# Patient Record
Sex: Female | Born: 1960 | Race: White | Hispanic: No | Marital: Married | Smoking: Former smoker
Health system: Southern US, Community
[De-identification: ages and names within clinical notes are randomized; demographics above are authoritative.]

## PROBLEM LIST (undated history)

## (undated) DIAGNOSIS — I10 Essential (primary) hypertension: Secondary | ICD-10-CM

## (undated) DIAGNOSIS — I4891 Unspecified atrial fibrillation: Secondary | ICD-10-CM

## (undated) DIAGNOSIS — C50919 Malignant neoplasm of unspecified site of unspecified female breast: Secondary | ICD-10-CM

## (undated) DIAGNOSIS — I2510 Atherosclerotic heart disease of native coronary artery without angina pectoris: Secondary | ICD-10-CM

## (undated) DIAGNOSIS — K219 Gastro-esophageal reflux disease without esophagitis: Secondary | ICD-10-CM

## (undated) DIAGNOSIS — E119 Type 2 diabetes mellitus without complications: Secondary | ICD-10-CM

## (undated) HISTORY — PX: FINGER SURGERY: SHX640

## (undated) HISTORY — DX: Gastro-esophageal reflux disease without esophagitis: K21.9

## (undated) HISTORY — PX: MASTECTOMY: SHX3

## (undated) HISTORY — DX: Essential (primary) hypertension: I10

## (undated) HISTORY — DX: Type 2 diabetes mellitus without complications: E11.9

## (undated) HISTORY — DX: Atherosclerotic heart disease of native coronary artery without angina pectoris: I25.10

## (undated) HISTORY — DX: Malignant neoplasm of unspecified site of unspecified female breast: C50.919

---

## 1999-03-21 ENCOUNTER — Ambulatory Visit (HOSPITAL_COMMUNITY): Admission: RE | Admit: 1999-03-21 | Discharge: 1999-03-21 | Payer: Self-pay | Admitting: Obstetrics and Gynecology

## 1999-03-21 ENCOUNTER — Encounter: Payer: Self-pay | Admitting: Obstetrics and Gynecology

## 1999-07-21 ENCOUNTER — Encounter: Admission: RE | Admit: 1999-07-21 | Discharge: 1999-07-21 | Payer: Self-pay | Admitting: Internal Medicine

## 1999-12-22 DIAGNOSIS — K219 Gastro-esophageal reflux disease without esophagitis: Secondary | ICD-10-CM | POA: Insufficient documentation

## 1999-12-22 HISTORY — DX: Gastro-esophageal reflux disease without esophagitis: K21.9

## 2000-01-06 ENCOUNTER — Encounter: Payer: Self-pay | Admitting: Obstetrics and Gynecology

## 2000-01-06 ENCOUNTER — Encounter: Admission: RE | Admit: 2000-01-06 | Discharge: 2000-01-06 | Payer: Self-pay | Admitting: Obstetrics and Gynecology

## 2000-01-26 ENCOUNTER — Encounter: Payer: Self-pay | Admitting: *Deleted

## 2000-01-26 ENCOUNTER — Encounter (INDEPENDENT_AMBULATORY_CARE_PROVIDER_SITE_OTHER): Payer: Self-pay | Admitting: Specialist

## 2000-01-26 ENCOUNTER — Ambulatory Visit (HOSPITAL_COMMUNITY): Admission: RE | Admit: 2000-01-26 | Discharge: 2000-01-26 | Payer: Self-pay | Admitting: *Deleted

## 2000-01-30 ENCOUNTER — Encounter: Admission: RE | Admit: 2000-01-30 | Discharge: 2000-04-29 | Payer: Self-pay | Admitting: Radiation Oncology

## 2000-02-12 ENCOUNTER — Ambulatory Visit (HOSPITAL_COMMUNITY): Admission: RE | Admit: 2000-02-12 | Discharge: 2000-02-13 | Payer: Self-pay

## 2000-02-12 ENCOUNTER — Encounter (INDEPENDENT_AMBULATORY_CARE_PROVIDER_SITE_OTHER): Payer: Self-pay | Admitting: Specialist

## 2000-03-01 ENCOUNTER — Encounter: Payer: Self-pay | Admitting: Oncology

## 2000-03-01 ENCOUNTER — Ambulatory Visit (HOSPITAL_COMMUNITY): Admission: RE | Admit: 2000-03-01 | Discharge: 2000-03-01 | Payer: Self-pay | Admitting: Oncology

## 2000-03-08 ENCOUNTER — Other Ambulatory Visit: Admission: RE | Admit: 2000-03-08 | Discharge: 2000-03-08 | Payer: Self-pay | Admitting: Obstetrics and Gynecology

## 2000-03-09 ENCOUNTER — Ambulatory Visit (HOSPITAL_COMMUNITY): Admission: RE | Admit: 2000-03-09 | Discharge: 2000-03-09 | Payer: Self-pay

## 2000-03-12 ENCOUNTER — Encounter: Admission: RE | Admit: 2000-03-12 | Discharge: 2000-03-12 | Payer: Self-pay | Admitting: Hematology and Oncology

## 2000-06-18 ENCOUNTER — Other Ambulatory Visit: Admission: RE | Admit: 2000-06-18 | Discharge: 2000-06-18 | Payer: Self-pay

## 2000-06-18 ENCOUNTER — Encounter (INDEPENDENT_AMBULATORY_CARE_PROVIDER_SITE_OTHER): Payer: Self-pay | Admitting: *Deleted

## 2000-06-18 ENCOUNTER — Encounter: Admission: RE | Admit: 2000-06-18 | Discharge: 2000-06-18 | Payer: Self-pay

## 2000-07-07 ENCOUNTER — Inpatient Hospital Stay (HOSPITAL_COMMUNITY): Admission: RE | Admit: 2000-07-07 | Discharge: 2000-07-08 | Payer: Self-pay

## 2000-08-09 ENCOUNTER — Encounter: Admission: RE | Admit: 2000-08-09 | Discharge: 2000-08-09 | Payer: Self-pay | Admitting: Radiation Oncology

## 2000-09-29 ENCOUNTER — Encounter: Admission: RE | Admit: 2000-09-29 | Discharge: 2000-12-28 | Payer: Self-pay | Admitting: Radiation Oncology

## 2000-10-21 ENCOUNTER — Ambulatory Visit: Admission: RE | Admit: 2000-10-21 | Discharge: 2000-10-21 | Payer: Self-pay | Admitting: *Deleted

## 2001-02-07 ENCOUNTER — Ambulatory Visit (HOSPITAL_BASED_OUTPATIENT_CLINIC_OR_DEPARTMENT_OTHER): Admission: RE | Admit: 2001-02-07 | Discharge: 2001-02-07 | Payer: Self-pay

## 2001-03-14 ENCOUNTER — Other Ambulatory Visit: Admission: RE | Admit: 2001-03-14 | Discharge: 2001-03-14 | Payer: Self-pay | Admitting: Obstetrics and Gynecology

## 2001-06-13 ENCOUNTER — Encounter: Admission: RE | Admit: 2001-06-13 | Discharge: 2001-06-13 | Payer: Self-pay

## 2001-08-10 ENCOUNTER — Encounter: Admission: RE | Admit: 2001-08-10 | Discharge: 2001-08-10 | Payer: Self-pay | Admitting: Oncology

## 2001-08-10 ENCOUNTER — Encounter: Payer: Self-pay | Admitting: Oncology

## 2001-09-13 ENCOUNTER — Emergency Department (HOSPITAL_COMMUNITY): Admission: AC | Admit: 2001-09-13 | Discharge: 2001-09-13 | Payer: Self-pay

## 2002-03-17 ENCOUNTER — Other Ambulatory Visit: Admission: RE | Admit: 2002-03-17 | Discharge: 2002-03-17 | Payer: Self-pay | Admitting: Obstetrics and Gynecology

## 2002-04-10 ENCOUNTER — Ambulatory Visit (HOSPITAL_COMMUNITY): Admission: RE | Admit: 2002-04-10 | Discharge: 2002-04-10 | Payer: Self-pay | Admitting: Orthopedic Surgery

## 2002-04-10 ENCOUNTER — Encounter: Payer: Self-pay | Admitting: Orthopedic Surgery

## 2002-08-14 ENCOUNTER — Encounter: Admission: RE | Admit: 2002-08-14 | Discharge: 2002-08-14 | Payer: Self-pay | Admitting: Oncology

## 2002-08-14 ENCOUNTER — Encounter: Payer: Self-pay | Admitting: Oncology

## 2003-08-30 ENCOUNTER — Encounter: Payer: Self-pay | Admitting: Oncology

## 2003-08-30 ENCOUNTER — Encounter: Admission: RE | Admit: 2003-08-30 | Discharge: 2003-08-30 | Payer: Self-pay | Admitting: Oncology

## 2004-01-30 ENCOUNTER — Encounter: Admission: RE | Admit: 2004-01-30 | Discharge: 2004-01-30 | Payer: Self-pay | Admitting: Internal Medicine

## 2004-08-18 ENCOUNTER — Encounter: Admission: RE | Admit: 2004-08-18 | Discharge: 2004-08-18 | Payer: Self-pay | Admitting: Obstetrics and Gynecology

## 2005-03-11 ENCOUNTER — Ambulatory Visit: Payer: Self-pay | Admitting: Oncology

## 2005-04-17 ENCOUNTER — Encounter: Admission: RE | Admit: 2005-04-17 | Discharge: 2005-04-17 | Payer: Self-pay | Admitting: Oncology

## 2005-04-18 ENCOUNTER — Encounter: Admission: RE | Admit: 2005-04-18 | Discharge: 2005-04-18 | Payer: Self-pay | Admitting: Oncology

## 2005-07-05 ENCOUNTER — Encounter: Payer: Self-pay | Admitting: *Deleted

## 2005-07-05 ENCOUNTER — Observation Stay (HOSPITAL_COMMUNITY): Admission: RE | Admit: 2005-07-05 | Discharge: 2005-07-08 | Payer: Self-pay | Admitting: Internal Medicine

## 2005-07-06 ENCOUNTER — Encounter (INDEPENDENT_AMBULATORY_CARE_PROVIDER_SITE_OTHER): Payer: Self-pay | Admitting: Cardiology

## 2005-07-30 ENCOUNTER — Encounter: Admission: RE | Admit: 2005-07-30 | Discharge: 2005-10-28 | Payer: Self-pay | Admitting: Internal Medicine

## 2006-02-05 ENCOUNTER — Encounter: Admission: RE | Admit: 2006-02-05 | Discharge: 2006-02-05 | Payer: Self-pay | Admitting: Internal Medicine

## 2006-03-09 ENCOUNTER — Ambulatory Visit: Payer: Self-pay | Admitting: Oncology

## 2006-03-24 ENCOUNTER — Encounter: Admission: RE | Admit: 2006-03-24 | Discharge: 2006-03-24 | Payer: Self-pay | Admitting: Oncology

## 2006-04-21 ENCOUNTER — Encounter: Admission: RE | Admit: 2006-04-21 | Discharge: 2006-04-21 | Payer: Self-pay | Admitting: Oncology

## 2007-03-03 ENCOUNTER — Ambulatory Visit: Payer: Self-pay | Admitting: Oncology

## 2007-03-08 LAB — COMPREHENSIVE METABOLIC PANEL
ALT: 15 U/L (ref 0–35)
AST: 16 U/L (ref 0–37)
Albumin: 3.8 g/dL (ref 3.5–5.2)
Alkaline Phosphatase: 51 U/L (ref 39–117)
BUN: 16 mg/dL (ref 6–23)
CO2: 30 mEq/L (ref 19–32)
Calcium: 9.2 mg/dL (ref 8.4–10.5)
Chloride: 97 mEq/L (ref 96–112)
Creatinine, Ser: 0.87 mg/dL (ref 0.40–1.20)
Glucose, Bld: 91 mg/dL (ref 70–99)
Potassium: 3.8 mEq/L (ref 3.5–5.3)
Sodium: 135 mEq/L (ref 135–145)
Total Bilirubin: 0.6 mg/dL (ref 0.3–1.2)
Total Protein: 7.2 g/dL (ref 6.0–8.3)

## 2007-03-08 LAB — CBC WITH DIFFERENTIAL/PLATELET
BASO%: 0.3 % (ref 0.0–2.0)
Basophils Absolute: 0 10*3/uL (ref 0.0–0.1)
EOS%: 1.2 % (ref 0.0–7.0)
Eosinophils Absolute: 0.2 10*3/uL (ref 0.0–0.5)
HCT: 38.1 % (ref 34.8–46.6)
HGB: 13.2 g/dL (ref 11.6–15.9)
LYMPH%: 16.9 % (ref 14.0–48.0)
MCH: 29.4 pg (ref 26.0–34.0)
MCHC: 34.8 g/dL (ref 32.0–36.0)
MCV: 84.5 fL (ref 81.0–101.0)
MONO#: 1 10*3/uL — ABNORMAL HIGH (ref 0.1–0.9)
MONO%: 6.9 % (ref 0.0–13.0)
NEUT#: 11 10*3/uL — ABNORMAL HIGH (ref 1.5–6.5)
NEUT%: 74.7 % (ref 39.6–76.8)
Platelets: 341 10*3/uL (ref 145–400)
RBC: 4.51 10*6/uL (ref 3.70–5.32)
RDW: 14.1 % (ref 11.3–14.5)
WBC: 14.7 10*3/uL — ABNORMAL HIGH (ref 3.9–10.0)
lymph#: 2.5 10*3/uL (ref 0.9–3.3)

## 2007-03-08 LAB — LACTATE DEHYDROGENASE: LDH: 142 U/L (ref 94–250)

## 2007-05-25 ENCOUNTER — Encounter: Admission: RE | Admit: 2007-05-25 | Discharge: 2007-05-25 | Payer: Self-pay | Admitting: Oncology

## 2007-06-07 ENCOUNTER — Encounter: Admission: RE | Admit: 2007-06-07 | Discharge: 2007-06-07 | Payer: Self-pay | Admitting: Oncology

## 2008-03-09 ENCOUNTER — Ambulatory Visit: Payer: Self-pay | Admitting: Oncology

## 2008-03-10 IMAGING — MG MM DIAGNOSTIC LTD LEFT
3 series · 3 of 3 positions shown · non-contrast
Comparison: none

[REDACTED] LEFT
CC and MLO view(s) were taken of the left breast.

DIGITAL LIMITED LEFT DIAGNOSTIC MAMMOGRAM:
CLINICAL DATA: Abnormal screening mammogram; history of right breast cancer with mastectomy in 
4779.
Comparison studies are dated 08-18-04 and 03-24-06.  Additional views reveal no persistent mass or 
distortion within the subareolar left breast.    The fibroglandular parenchymal pattern is stable.

[L CC (1 of 2)]
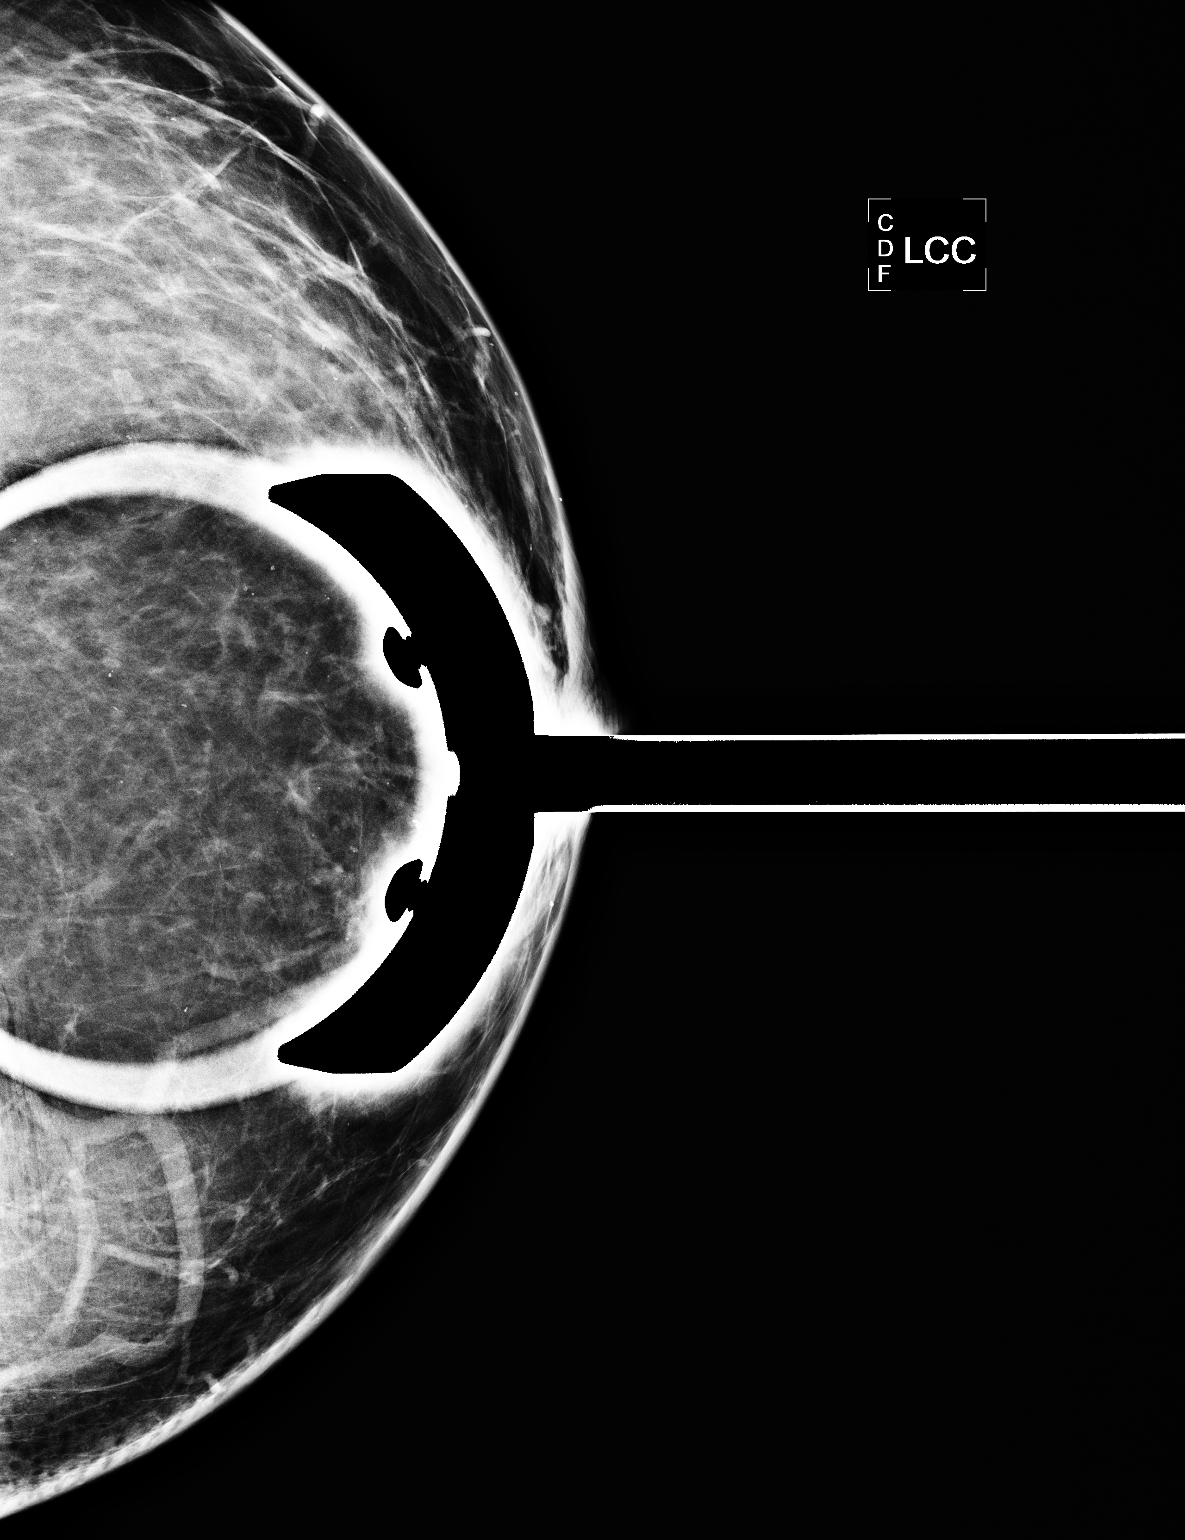

[L ML]
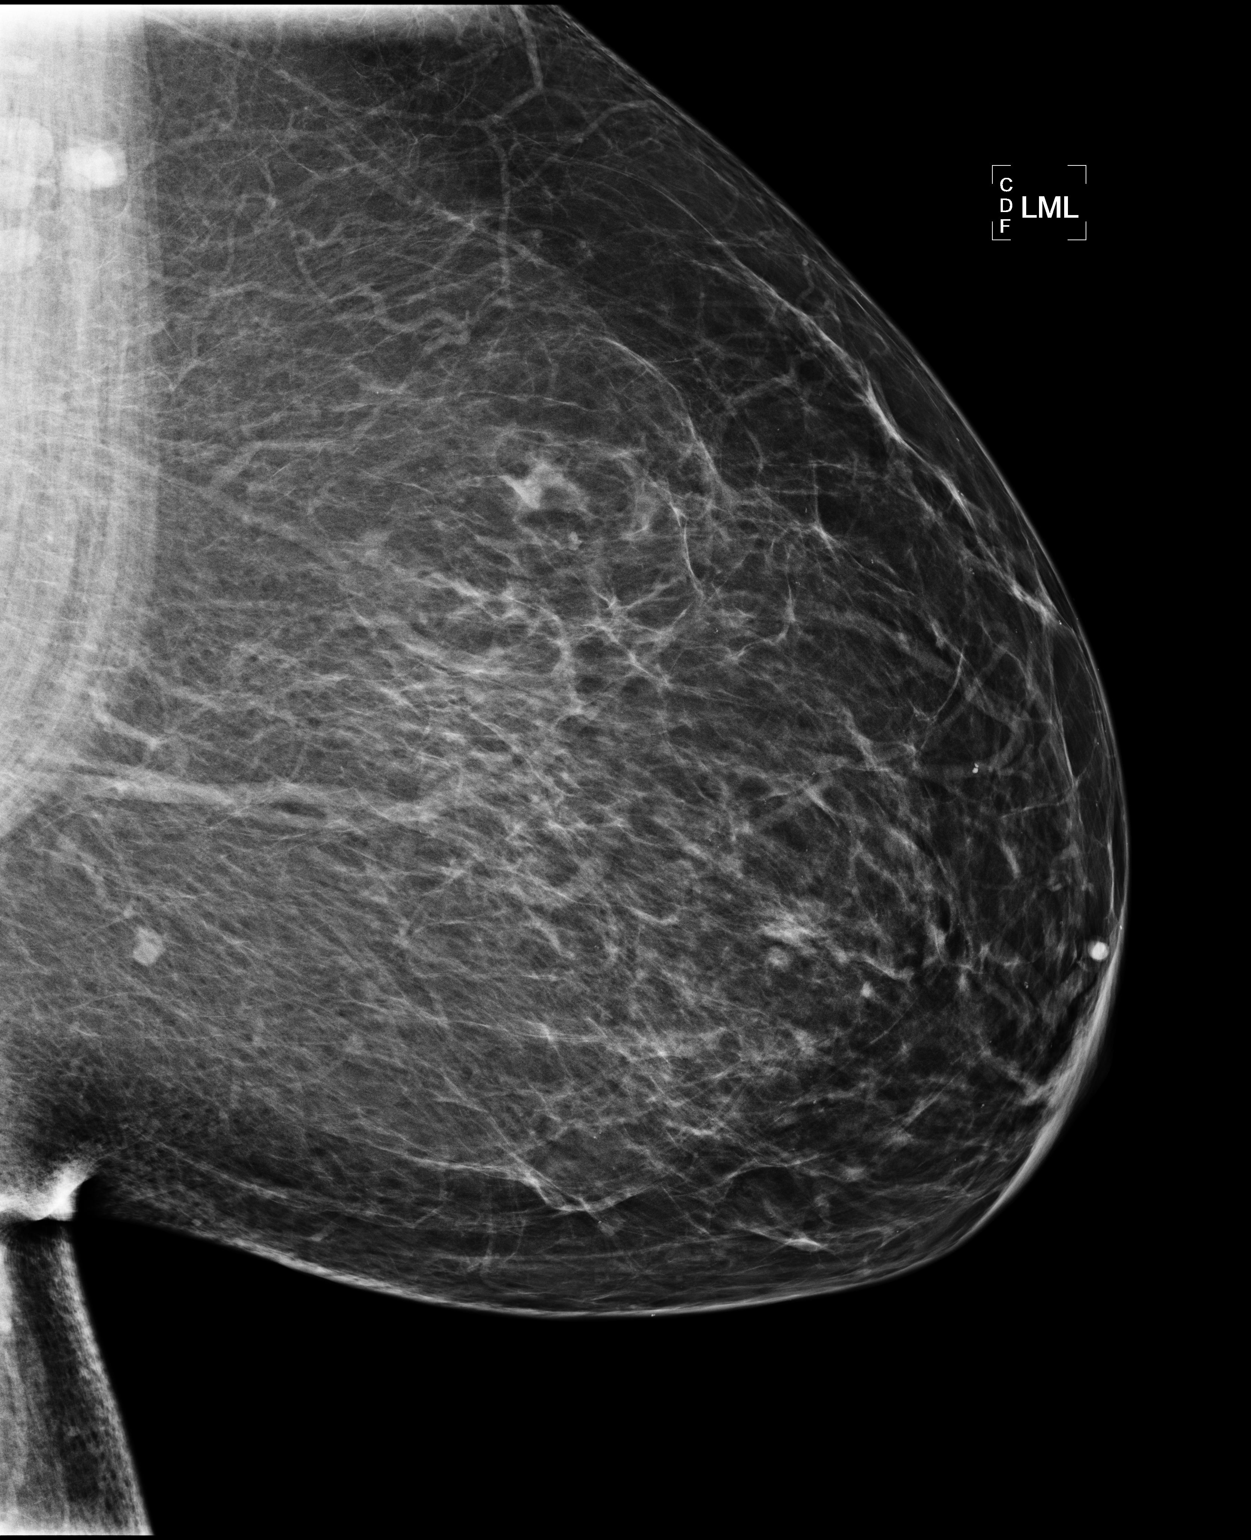

[L CC (2 of 2)]
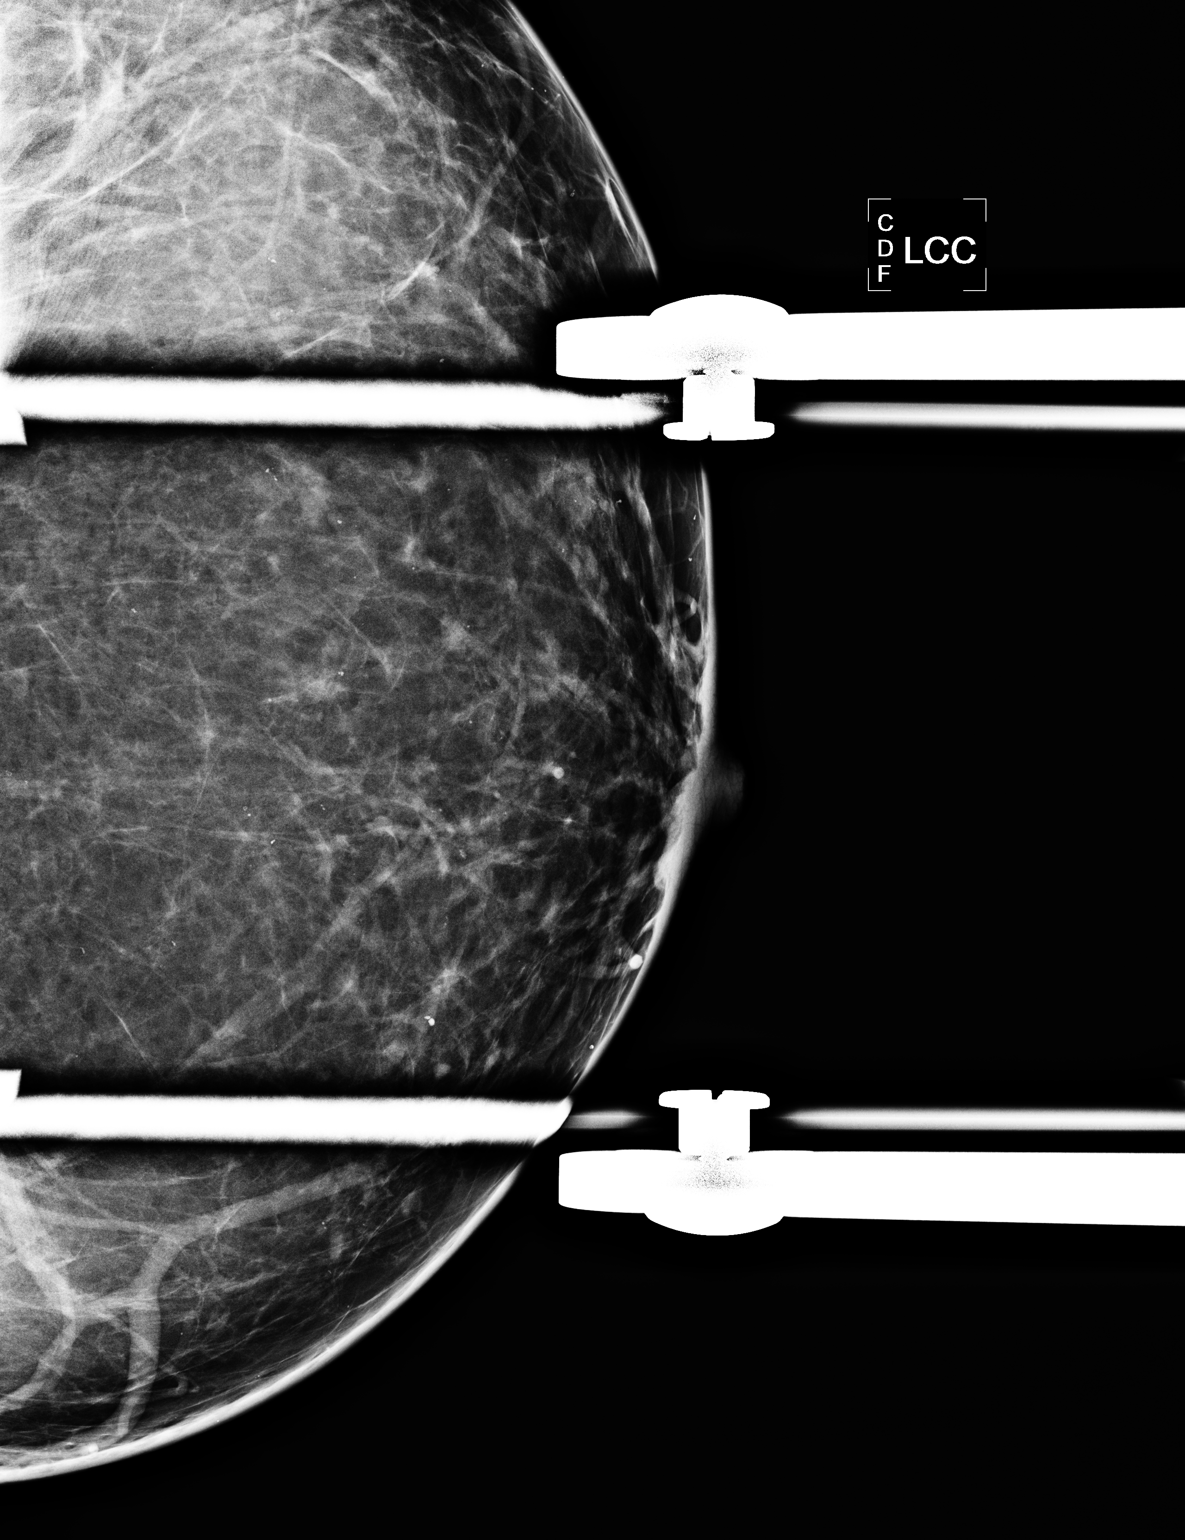

[3 of 3 positions shown; findings below may reference images not displayed]

IMPRESSION: There is no specific radiographic evidence of malignancy on the left.  Screening mammogram in one 
year is recommended.

ASSESSMENT: Negative - BI-RADS 1

Screening mammogram of both breasts in 1 year.
, THIS PROCEDURE WAS A DIGITAL MAMMOGRAM.

## 2008-04-19 LAB — CBC WITH DIFFERENTIAL/PLATELET
BASO%: 0.2 % (ref 0.0–2.0)
Basophils Absolute: 0 10*3/uL (ref 0.0–0.1)
EOS%: 1.4 % (ref 0.0–7.0)
Eosinophils Absolute: 0.2 10*3/uL (ref 0.0–0.5)
HCT: 37.9 % (ref 34.8–46.6)
HGB: 13 g/dL (ref 11.6–15.9)
LYMPH%: 19.5 % (ref 14.0–48.0)
MCH: 29.2 pg (ref 26.0–34.0)
MCHC: 34.2 g/dL (ref 32.0–36.0)
MCV: 85.4 fL (ref 81.0–101.0)
MONO#: 0.6 10*3/uL (ref 0.1–0.9)
MONO%: 4.1 % (ref 0.0–13.0)
NEUT#: 10.8 10*3/uL — ABNORMAL HIGH (ref 1.5–6.5)
NEUT%: 74.8 % (ref 39.6–76.8)
Platelets: 350 10*3/uL (ref 145–400)
RBC: 4.44 10*6/uL (ref 3.70–5.32)
RDW: 14.1 % (ref 11.3–14.5)
WBC: 14.4 10*3/uL — ABNORMAL HIGH (ref 3.9–10.0)
lymph#: 2.8 10*3/uL (ref 0.9–3.3)

## 2008-04-19 LAB — COMPREHENSIVE METABOLIC PANEL
ALT: 11 U/L (ref 0–35)
AST: 14 U/L (ref 0–37)
Albumin: 3.9 g/dL (ref 3.5–5.2)
Alkaline Phosphatase: 52 U/L (ref 39–117)
BUN: 11 mg/dL (ref 6–23)
CO2: 26 mEq/L (ref 19–32)
Calcium: 8.4 mg/dL (ref 8.4–10.5)
Chloride: 99 mEq/L (ref 96–112)
Creatinine, Ser: 0.74 mg/dL (ref 0.40–1.20)
Glucose, Bld: 137 mg/dL — ABNORMAL HIGH (ref 70–99)
Potassium: 3.3 mEq/L — ABNORMAL LOW (ref 3.5–5.3)
Sodium: 139 mEq/L (ref 135–145)
Total Bilirubin: 0.4 mg/dL (ref 0.3–1.2)
Total Protein: 7.3 g/dL (ref 6.0–8.3)

## 2008-04-19 LAB — LACTATE DEHYDROGENASE: LDH: 176 U/L (ref 94–250)

## 2008-04-24 ENCOUNTER — Ambulatory Visit: Payer: Self-pay | Admitting: Oncology

## 2008-06-07 ENCOUNTER — Encounter: Admission: RE | Admit: 2008-06-07 | Discharge: 2008-06-07 | Payer: Self-pay | Admitting: Oncology

## 2008-10-23 ENCOUNTER — Ambulatory Visit: Payer: Self-pay | Admitting: Internal Medicine

## 2008-10-26 ENCOUNTER — Ambulatory Visit: Payer: Self-pay | Admitting: Internal Medicine

## 2008-10-31 ENCOUNTER — Encounter: Admission: RE | Admit: 2008-10-31 | Discharge: 2008-10-31 | Payer: Self-pay | Admitting: Internal Medicine

## 2008-11-08 ENCOUNTER — Ambulatory Visit: Payer: Self-pay | Admitting: Internal Medicine

## 2009-06-03 ENCOUNTER — Ambulatory Visit: Payer: Self-pay | Admitting: Oncology

## 2009-06-04 LAB — CBC WITH DIFFERENTIAL/PLATELET
BASO%: 0.5 % (ref 0.0–2.0)
Basophils Absolute: 0.1 10*3/uL (ref 0.0–0.1)
EOS%: 2.3 % (ref 0.0–7.0)
Eosinophils Absolute: 0.2 10*3/uL (ref 0.0–0.5)
HCT: 36.4 % (ref 34.8–46.6)
HGB: 12.5 g/dL (ref 11.6–15.9)
LYMPH%: 24.1 % (ref 14.0–49.7)
MCH: 29.6 pg (ref 25.1–34.0)
MCHC: 34.4 g/dL (ref 31.5–36.0)
MCV: 86 fL (ref 79.5–101.0)
MONO#: 0.9 10*3/uL (ref 0.1–0.9)
MONO%: 8.6 % (ref 0.0–14.0)
NEUT#: 6.9 10*3/uL — ABNORMAL HIGH (ref 1.5–6.5)
NEUT%: 64.5 % (ref 38.4–76.8)
Platelets: 307 10*3/uL (ref 145–400)
RBC: 4.23 10*6/uL (ref 3.70–5.45)
RDW: 13.8 % (ref 11.2–14.5)
WBC: 10.7 10*3/uL — ABNORMAL HIGH (ref 3.9–10.3)
lymph#: 2.6 10*3/uL (ref 0.9–3.3)

## 2009-06-04 LAB — COMPREHENSIVE METABOLIC PANEL
ALT: 14 U/L (ref 0–35)
AST: 14 U/L (ref 0–37)
Albumin: 3.6 g/dL (ref 3.5–5.2)
Alkaline Phosphatase: 57 U/L (ref 39–117)
BUN: 13 mg/dL (ref 6–23)
CO2: 23 mEq/L (ref 19–32)
Calcium: 8.8 mg/dL (ref 8.4–10.5)
Chloride: 101 mEq/L (ref 96–112)
Creatinine, Ser: 0.67 mg/dL (ref 0.40–1.20)
Glucose, Bld: 97 mg/dL (ref 70–99)
Potassium: 3.7 mEq/L (ref 3.5–5.3)
Sodium: 136 mEq/L (ref 135–145)
Total Bilirubin: 0.2 mg/dL — ABNORMAL LOW (ref 0.3–1.2)
Total Protein: 6.7 g/dL (ref 6.0–8.3)

## 2009-06-04 LAB — LACTATE DEHYDROGENASE: LDH: 134 U/L (ref 94–250)

## 2009-06-06 ENCOUNTER — Encounter: Admission: RE | Admit: 2009-06-06 | Discharge: 2009-06-06 | Payer: Self-pay | Admitting: Oncology

## 2009-06-06 ENCOUNTER — Ambulatory Visit: Payer: Self-pay | Admitting: Internal Medicine

## 2009-06-20 ENCOUNTER — Ambulatory Visit: Payer: Self-pay | Admitting: Internal Medicine

## 2011-01-10 ENCOUNTER — Encounter: Payer: Self-pay | Admitting: Internal Medicine

## 2011-01-11 ENCOUNTER — Encounter: Payer: Self-pay | Admitting: Internal Medicine

## 2011-01-11 ENCOUNTER — Encounter: Payer: Self-pay | Admitting: Oncology

## 2011-01-12 ENCOUNTER — Encounter: Payer: Self-pay | Admitting: Oncology

## 2011-05-08 NOTE — Op Note (Signed)
[name]. [facility]  Patient:    [name], [name]                         MRN: [id number] Proc. Date: 02/12/00 Adm. Date:  [id number] Disc. Date: [id number] Attending:  Gennie Alma CC:         Malachi Pro. Ambrose Mantle, M.D.                           Operative Report  CCS# [id number]  PREOPERATIVE DIAGNOSIS:  Carcinoma of the right breast, upper outer quadrant.  POSTOPERATIVE DIAGNOSIS:  Carcinoma of the right breast, upper outer quadrant.  OPERATION PERFORMED:  Quadrantectomy of right breast, upper outer quadrant and right axillary dissection.  SURGEON:  Milus Mallick, M.D.  ANESTHESIA:  General endotracheal.  INDICATIONS FOR PROCEDURE:  This 50 year old female was known to have what was thought to be a 3 cm in diameter mass in the upper outer quadrant of the right breast.  A large core needle biopsy of it had been carried out by Dr. 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 The patient was seen in consultation by Dr. Kathrin Greathouse, who felt that the patient was a candidate for conservative surgery and radiation therapy.  She preferred this option over mastectomy.  There also was a nodule apparently noted on the mammogram of the left breast that was thought to be benign but it was suggested that she have a follow-up mammogram in six months.  We had contemplated doing a  needle localized biopsy of that breast in todays procedure; however, redo mammogram of the left breast and reinterpretation suggested that this was really a benign finding and that the patient would be best served by having follow-up mammogram in six months.  DESCRIPTION OF PROCEDURE:  Under adequate general endotracheal anesthesia, the patients right breast was prepared and draped in the usual fashion.  There was approximately a 3 to 4 cm diameter mass in the upper outer quadrant of the right breast at the 10 oclock radial.  A  quadrantectomy was planned to remove this. n elliptical incision was made in the skin in the 10 oclock radial.  The long axis of the ellipse was 16 cm in length and the short axis was approximately 3 cm. Superior medial and inferior lateral flaps were fashioned using Bovie electrocoagulation down to just above the fascia.  The flaps extended medially o the 12 oclock radial and inferiorly to the 9 oclock radial.  The entire quadrant was then excised using Bovie electrocoagulation.  The specimen was sent for pathologic study.  The specimen was marked with a single suture on the superior  side, double suture on the medial side.  The cut surface of it in pathology suggested that the closest margin was 1.5 cm and that was the superior margin.   The pectoralis major muscle was exposed.  The lateral aspect of the incision an  axillary dissection was carried out.  The clavipectoral fascia was incised parallel to the pectoralis major muscle and the chest wall was encountered.  Dissection as carried out to identify the axillary vein and the apex of the axillary dissection. The highest axillary node was excised and sent as a separate specimen.  Next all of the lymphareolar tissue in the pyramid bounded by the axillary vein laterally, he chest wall medially, the pectoralis minor muscle  anteriorly and subscapularis muscle posteriorly, was excised over Hemoclips.  Tributaries to the axillary vein were controlled with triple clipped technique with two clips left on the stay side. The second intercostal brachial nerve was sacrificed over clips.  The long thoracic nerve of Bell and the thoracodorsal nerves were each identified and demonstrated throughout the course of the axilla and spared of any injury.  The lateral aspect of the dissection was the latissimus dorsi muscle.  Specimen was removed from the operative field.  Hemostasis was ascertained.  The axillary cavity was irrigated with  sterile saline solution until clear.  A 10 mm Jackson-Pratt drain was placed in the axilla and brought out through a stab wound in the low axillary skin. The clavipectoral fascia was repaired with interrupted sutures of 4-0 Vicryl. Next, hemostasis was ascertained in the breast.  The subcuticular layer of the breast  skin was closed with interrupted sutures of 4-0 Vicryl.  This was done after inserting 10 mm Jackson-Pratt drain and bringing it out the axilla as well. Both drains were sewed to the skin with 3-0 nylon.  The skin incision was closed with generic skin stapler 35-W.  Sterile dressing was applied.  Estimated blood loss for this procedure was approximately 150 cc.  The patient tolerated the procedure well and left the operating room in satisfactory condition. DD:  02/12/00 TD:  02/12/00 Job: [id number]

## 2011-05-08 NOTE — Discharge Summary (Signed)
NAMECRISTA, [name]                  ACCOUNT NO.:  [id number]   MEDICAL RECORD NO.:  [id number]          PATIENT TYPE:  INP   LOCATION:  6711                         FACILITY:  [facility]   PHYSICIAN:  [name], M.D.DATE OF BIRTH:  [birth year]   DATE OF ADMISSION:  07/05/2005  DATE OF DISCHARGE:  07/08/2005                                 DISCHARGE SUMMARY   PRIMARY CARE PHYSICIAN:  Luanna Cole. Lenord Fellers, M.D.   DISCHARGE DIAGNOSES:  1.  Syncopal episode.  2.  Normocytic anemia.  3.  History of breast cancer, status post right mastectomy four years ago.  4.  Hypertension.   DISCHARGE MEDICATIONS:  1.  Altace 10 mg p.o. daily.  2.  Furosemide 40 mg p.o. daily.  3.  Foltx iron tab, one p.o. daily.  4.  Aspirin 81 mg, one p.o. daily.  5.  Zantac 150 mg, one p.o. daily.   PROCEDURE:  1.  A 2-D echocardiogram performed on July 06, 2005.  The results read as      overall left ventricular systolic function normal with an ejection      fraction estimated at between 55%-65%.  The study technically inadequate      for evaluation of left ventricular regional wall abnormalities.  2.  Right upper quadrant ultrasound performed on July 06, 2005, showing no      gallstones, fatty infiltration of the liver.  3.  A head CT done on July 05, 2005, showing no acute hemorrhage of edema,      with a congenital variant of ventricles.  4.  A chest x-ray done on July 05, 2005, as well showing postoperative      changes, chronic peribronchial thickening, no acute interval change.   CONSULTATIONS:  None.   HISTORY OF PRESENT ILLNESS:  The patient is a pleasant 50 year old,  moderately-obese female with a history of breast cancer, who while shopping  at [employer] had suffered a syncopal episode which was preceded by some  prodromal symptoms of mild nausea, diaphoresis and the syncopal episode  which was witnessed, lasting for about two to three minutes.  The patient  had a rapid recovery.  No  post-ictal signs.  No signs or evidence of seizure  at that time.  The patient was brought to the emergency room initially by  checking orthostatic blood pressure.  The patient was not orthostatic and  initial evaluation with a chest x-ray and head CT was negative.  The  patient's  initial electrocardiogram also showed no evidence of ischemia, a  normal sinus rhythm, but did have a slightly prolonged QTC interval of 464.   HOSPITAL COURSE:  The patient was admitted for observation and further  evaluation.  She did not have any further episodes of dizziness or syncopal  episodes during her hospital stay.  An echocardiogram showed the results as  above.  Cardiac enzymes were also checked and these were negative.  Also the  echocardiogram was negative for valvular heart disease.  The patient was on  telemetry throughout her hospital stay and showed no evidence of arrhythmia  during that time.  In addition, the patient had slightly elevated enzymes on  admission, which corrected by the time of discharge.  The patient does have  a normocytic anemia as well.   DISCHARGE LABORATORY DATA:  An AST of 40, ALT of 36, alkaline phosphatase  51, T-bilirubin 0.5.  Iron 43, total iron binding capacity of 239, ferritin  51.  BUN and creatinine were 7 and 0.6 respectively with a potassium of 3.8.  Hemoglobin 11.4.   DISCHARGE PHYSICAL EXAM:  GENERAL:  At the time of discharge the patient was  alert and oriented  VITAL SIGNS:  Temperature 98.1 degrees, blood pressure 138/80, pulse 84,  saturation 98% on room air.  LUNGS:  Clear.  HEART:  Regular with normal S1 and S2.  ABDOMEN:  Soft.  EXTREMITIES:  No peripheral edema.   A repeat electrocardiogram was performed prior to discharge, which showed a  normal sinus rhythm.  No ST elevation or depression abnormalities.  No T-  wave abnormalities.  QT and QTC were 384 and 448 msec respectively.   FOLLOWUP:  The patient does have a follow-up appointment  scheduled with her  primary care physician.  This has been verified.   CONDITION ON DISCHARGE:  Improved.   DISCHARGE MEDICATIONS:  No changes to her medications were made.  The  patient is aware of the workup and findings.       RLK/MEDQ  D:  07/08/2005  T:  07/08/2005  Job:  [id number]   cc:   Luanna Cole. Lenord Fellers, M.D.  [address] [place]  [place]  [place] [address]  Fax: [phone]

## 2011-05-08 NOTE — H&P (Signed)
[name], [name] NO.:  [id number]   MEDICAL RECORD NO.:  [id number]          PATIENT TYPE:  EMS   LOCATION:  ED                           FACILITY:  [facility]   PHYSICIAN:  Hettie Holstein, D.O.    DATE OF BIRTH:  06-12-1961   DATE OF ADMISSION:  07/05/2005  DATE OF DISCHARGE:                                HISTORY & PHYSICAL   PRIMARY CARE PHYSICIAN:  Luanna Cole. Lenord Fellers, M.D.   CHIEF COMPLAINT:  Passed out in [place].   HISTORY OF PRESENT ILLNESS:  [name] is a pleasant 50 year old  morbidly obese Caucasian female with a past medical history significant for  breast cancer, status post right mastectomy four years ago, and status post  radiation and chemotherapy. She had been in her usual state of health, doing  quite well, following closely with Dr. Genene Churn. [place] and Dr. Luanna Cole.  [place]. When she was shopping at [employer], she had presyncopal symptoms  with diaphoresis and vision blurring as well as weakness. She proceeded to  sit down at [employer] in the [employer]. A clerk brought her some  bottled water. She stated this did help. She felt a little bit better,  however. She was to have her groceries checked out and while she was being  handed the change she passed out and was unresponsive for approximately  three minutes. All of this is conveyed second hand from the clerk to Mrs.  Richard who stated that soon after we called 9-1-1 and she became responsive.  She had some episodes of dizziness in the emergency department. Orthostatics  were taken and did not reveal her to be orthostatic.   PAST MEDICAL HISTORY:  1.  Significant for right mastectomy four years ago, status post radiation      therapy and chemotherapy.  2.  Hypertension.  3.  She denies previous history of cerebrovascular accident.  4.  She retains her uterus, ovaries, and gallbladder.   PAST SURGICAL HISTORY:  No other surgeries.   OBSTETRICAL HISTORY:  She is  G0, P0.   FAMILY HISTORY:  Her mother is alive and well at age 62 with only  hypertension. Father is alive and well only with diabetes at age 60.   SOCIAL HISTORY:  The patient denies tobacco. She drinks only occasional  alcohol. She lives with her parents. She is not married. Has no children.   MEDICATIONS:  1.  Altace 10 mg daily.  2.  Furosemide 40 mg daily.  3.  Foltx daily.  4.  Aspirin 81 mg daily.  5.  Zantac 150 mg daily.  6.  She has had no recent changes in her medications.   ALLERGIES:  She has no known drug allergies.   REVIEW OF SYSTEMS:  She stated she has had no weight loss, no anorexia. No  nausea with these particular episodes; however, the past Thursday and  Friday, she did have some nausea and vomiting that did subsequently resolve.  No abdominal pain, no diarrhea, no dysuria, no change in her lower extremity  swelling from before.   LABORATORY DATA:  INR is 1.0, sodium 137, potassium 3.5, BUN 7, creatinine  0.3. Glucose 118, CO2 31, AST/ALT 55/50. Protein 7.7, albumin 3.3. WBC of  18.5, hemoglobin 13.4, platelet count 377,000, MCV 82.   EKG revealed a mildly prolonged QTC of 464. She is in normal sinus rhythm  without evidence of ischemia.   PHYSICAL EXAMINATION:  VITAL SIGNS:  As noted above, she did not exhibit  orthostasis in the emergency department. Her blood pressure was 118/73,  heart rate 89, respirations 20. Oxygen saturation 99%.  GENERAL:  The patient is alert and in no acute distress, conversant.  NECK:  Supple and nontender. No palpable thyromegaly or mass.  CARDIOVASCULAR:  Normal S1 and S2 without S3 or S4.  LUNGS:  Clear to auscultation bilaterally. Normal effort. No dullness to  percussion.  ABDOMEN:  Soft and nontender. No palpable hepatosplenomegaly or mass. No  suprapubic or costovertebral angle tenderness.  EXTREMITIES:  Lower extremity revealed no evidence of pitting edema.  Peripheral pulses are palpable.  NEUROLOGICAL:  Alert and  no focal deficits were evident.   ASSESSMENT:  1.  Syncope.  2.  Abnormal liver function tests.  3.  Hypoalbuminemia.  4.  Morbid obesity.  5.  History of breast cancer, status post radiation and chemotherapy in the      past.  6.  History of hypertension.  7.  Prolonged QTC on initial electrocardiogram.  8.  Hypokalemia.   PLAN:  We are going to admit [name] to telemetry floor. Follow her  course clinically. Repeat her EKG in the morning. Cycle her cardiac markers.  Check a 2-D echocardiogram. In addition, follow up her right upper quadrant  ultrasound and perhaps CT as her LFTs are elevated and she does have a  previous history of breast cancer. We will follow orthostatics in the a.m.  and ambulate her. If there is no abnormal findings on the studies ordered,  she may be followed in the outpatient setting.       ESS/MEDQ  D:  07/05/2005  T:  07/05/2005  Job:  [id number]   cc:   Luanna Cole. Lenord Fellers, M.D.  [address], [place]  [place]  [place] [address]  Fax: [phone]

## 2011-05-08 NOTE — Op Note (Signed)
[place]. [facility]  Patient:    [name], [name]                         MRN: [id number] Proc. Date: 07/06/00 Adm. Date:  [id number] Attending:  Gennie Alma CC:         [name], M.D.             Wynn Banker, M.D.             Malachi Pro. Ambrose Mantle, M.D.             Corwin Levins, M.D. [facility]                           Operative Report  CENTRAL [place] NUMBER:  [id number].  PREOPERATIVE DIAGNOSIS:  Recurrent carcinoma of the right breast, upper-outer quadrant, status post right quadrantectomy, axillary dissection and active chemotherapy.  POSTOPERATIVE DIAGNOSIS:  Recurrent carcinoma of the right breast, upper-outer quadrant, status post right quadrantectomy, axillary dissection and active chemotherapy.  OPERATION:  Right total mastectomy.  SURGEON:  Milus Mallick, M.D.  ASSISTANT:  Donnie Coffin. Samuella Cota, M.D.  ANESTHESIA:  General endotracheal.  HISTORY:  This 50 year old female underwent a right quadrantectomy and right axillary dissection on February 12, 2000.  She then underwent a course of pre-radiation chemotherapy.  She was tolerating it very well but developed a nodule of the right breast adjacent to the scar of the previous partial mastectomy.  The lesion was biopsied by a core biopsy by ______ and this revealed recurrent breast cancer.  In addition, the patient has developed another nodule lateral to the first one.  She is brought to the operating room for right total mastectomy.  DESCRIPTION OF PROCEDURE:  Under adequate general endotracheal anesthesia, the patients right breast was prepared and draped in the usual fashion.  There was an oblique scar in the upper-outer quadrant.  Lateral to that scar was a palpable nodule that was erythematous and lateral to the erythematous nodule, there was another palpable nodule further lateral.  An elliptical incision was designed to include the nipple and the nodules and take a goodly  portion of the skin of the breast.  Incision was made with a knife and then Bovie electrocoagulation.  A good deal of bleeding was encountered due to unusually large veins that were superficial subcutaneous veins.  These were handled by clamping and ligating with 4-0 Vicryl.  The superior and inferior skin flaps were then developed extending down to the rectus abdominis muscle inferiorly and up to the region of the clavicle superiorly.  The flaps extended laterally to the region of the latissimus dorsi muscle and medially to the sternum. When the flaps were completely developed, the breast was excised from the chest wall, including the pectoralis fascia.  This was a very difficult dissection due to a good deal of scar tissue.  Bleeders were clamped with hemostats and suture-ligated with 4-0 Vicryl.  The specimen was gradually dissected off of the chest wall with Bovie electrocoagulation.  The specimen was removed from the operative field.  Bleeders were electrocoagulated and in some instances, suture-ligated with 4-0 Vicryl.  Hemostasis was ascertained. Two #19 Blake drains were inserted into the operative field through the inferior flap.  The lateral one drained the lateral aspect of the chest and the medial one drained the superior flap.  They were sewn to the skin with 3-0  nylon.  The skin was then reapproximated with generic skin stapler 35-W and a sterile dressing was applied.  Estimated blood loss for the procedure was approximately 500 to 600 cc.  Patient tolerated the procedure well and left the operating room in satisfactory condition.DD:  07/06/00 TD:  07/07/00 Job: [id number]

## 2011-05-08 NOTE — Op Note (Signed)
[place]. [facility]  Patient:    [name], [name]                         MRN: [id number] Proc. Date: 03/09/00 Adm. Date:  [id number] Disc. Date: [id number] Attending:  Gennie Alma CC:         [name], M.D.             Guadalupe Maple, M.D.             Wynn Banker, M.D.                           Operative Report  CENTRAL [place] NUMBER:  [id number]  PREOPERATIVE DIAGNOSIS:  Carcinoma of the right breast.  POSTOPERATIVE DIAGNOSIS:  Carcinoma of the right breast.  OPERATION:  Insertion of Port-A-Cath in the left subclavian vein.  SURGEON:  Milus Mallick, M.D.  INTRAOPERATIVE CONSULTANT:  Guadalupe Maple, M.D.  ANESTHESIA:  Local infiltration with 1% Xylocaine - 20 cc and 0.5% Xylocaine 20 cc. Monitored anesthesia care.  DESCRIPTION OF PROCEDURE:  Under adequate perioperative intravenous sedation the patients chest was prepared and draped in the usual fashion for Port-A-Cath insertion.  The left subclavian region was infiltrated with 1% Xylocaine. Multiple attempts to cannulate the left subclavian vein were attempted by the attending surgeon, and we were unable to successfully hit it with the needle.  We asked Dr. Kipp Brood to consult in the operation from the department of anesthesia.  He  made a few attempts at trying to locate the left internal jugular vein, which were unsuccessful.  He was successful in placing a needle into the left subclavian vein through the infraclavicular approach.  This was quite difficult because of the patients large size and thick chest wall.  A guidewire was started down the needle and the attending surgeon then continued on in a routine manner.  The position of the wire was checked by C-arm fluoroscopy. The left second intercostal space was infiltrated with 0.5% Xylocaine.  A transverse incision was made large enough to accept a Bard port.  A subcutaneous pocket was then reflected  inferior into the incision.  The preattached catheter on a Bard port was then threaded from the port incision up to the small incision that was made around the wire in the infraclavicular space.  Holding sutures of 2- Prolene were then inserted into the port and deep fascia, but left untied. Next, the catheter was cut to 18 cm in length and then inserted into the patient through an introducer supplied with the Bard port and threaded down into the superior vena cava.  Position was checked with C-arm fluoroscopy and found to be adequate. This left the whole system implanted subcutaneously and intravenously.  The holding sutures were tied securely.  Hemostasis was ascertained.  The port was flushed nd had good blood return.  The subcuticular layer was reapproximated with continuous suture of 5-0 Vicryl.  Steri-Strips were applied to the skin.  A heparin block of 500 units in 5 cc of  saline was introduced into the port.  Sterile dressings were applied. Estimated blood loss for the procedure was approximately 100 cc.  The patient tolerated the procedure well and left the operating room in satisfactory condition. DD:  03/09/00 TD:  03/09/00 Job: [id number]

## 2011-05-08 NOTE — Op Note (Signed)
[place]. [facility]  Patient:    [name], [name]                         MRN: [id number] Proc. Date: 02/07/01 Adm. Date:  [id number] Attending:  Gennie Alma CC:         [name], M.D.   Operative Report  CCS #[id number].  PREOPERATIVE DIAGNOSIS:  Indwelling Port-A-Cath.  POSTOPERATIVE DIAGNOSIS:  Indwelling Port-A-Cath.  PROCEDURE:  Removal of Port-A-Cath.  SURGEON:  Milus Mallick, M.D.  ANESTHESIA:  Local infiltration with 1% Xylocaine, 15 cc, and monitored anesthesia care.  DESCRIPTION OF PROCEDURE:  Under adequate perioperative intravenous sedation, the patients left chest was prepared and draped in the usual fashion.  There was a protrusion in the second intercostal space of a known Port-A-Cath. There was a scar overlying it.  The area was infiltrated with 1% Xylocaine. The scar was then excised with an elliptical incision.  The incision was deepened into the pericapsule of the Port-A-Cath.  The catheter was incised, and the Port-A-Cath was grasped and placed on traction.  The holding sutures were cut and removed, of 2-0 Prolene.  The port and catheter were then withdrawn from the patient.  Hemostasis ascertained.  Bleeders were electrocoagulated.  The subcuticular area was reapproximated with a continuous suture of 4-0 Vicryl, half-inch Steri-Strips were applied to the skin, and a sterile dressing was applied.  Estimated blood loss from the procedure was less than 50 cc.  The patient tolerated the procedure well, left the operating room in satisfactory condition. DD:  02/07/01 TD:  02/07/01 Job: [id number]

## 2011-10-01 ENCOUNTER — Encounter: Payer: Self-pay | Admitting: Oncology

## 2011-10-01 ENCOUNTER — Other Ambulatory Visit: Payer: Self-pay | Admitting: Oncology

## 2011-10-01 ENCOUNTER — Encounter (HOSPITAL_BASED_OUTPATIENT_CLINIC_OR_DEPARTMENT_OTHER): Payer: Self-pay | Admitting: Oncology

## 2011-10-01 DIAGNOSIS — D72829 Elevated white blood cell count, unspecified: Secondary | ICD-10-CM

## 2011-10-01 DIAGNOSIS — C50419 Malignant neoplasm of upper-outer quadrant of unspecified female breast: Secondary | ICD-10-CM

## 2011-10-01 LAB — CBC WITH DIFFERENTIAL/PLATELET
BASO%: 0.5 % (ref 0.0–2.0)
Basophils Absolute: 0.1 10*3/uL (ref 0.0–0.1)
EOS%: 2.6 % (ref 0.0–7.0)
Eosinophils Absolute: 0.2 10*3/uL (ref 0.0–0.5)
HCT: 40.7 % (ref 34.8–46.6)
HGB: 14.2 g/dL (ref 11.6–15.9)
LYMPH%: 24.9 % (ref 14.0–49.7)
MCH: 30.8 pg (ref 25.1–34.0)
MCHC: 34.9 g/dL (ref 31.5–36.0)
MCV: 88.3 fL (ref 79.5–101.0)
MONO#: 0.8 10*3/uL (ref 0.1–0.9)
MONO%: 8.1 % (ref 0.0–14.0)
NEUT#: 5.9 10*3/uL (ref 1.5–6.5)
NEUT%: 63.9 % (ref 38.4–76.8)
Platelets: 297 10*3/uL (ref 145–400)
RBC: 4.61 10*6/uL (ref 3.70–5.45)
RDW: 12.7 % (ref 11.2–14.5)
WBC: 9.3 10*3/uL (ref 3.9–10.3)
lymph#: 2.3 10*3/uL (ref 0.9–3.3)

## 2011-10-02 ENCOUNTER — Encounter: Payer: Self-pay | Admitting: Internal Medicine

## 2011-10-02 ENCOUNTER — Ambulatory Visit (INDEPENDENT_AMBULATORY_CARE_PROVIDER_SITE_OTHER): Payer: Self-pay | Admitting: Internal Medicine

## 2011-10-02 VITALS — BP 128/68 | HR 90 | Temp 98.4°F | Ht 65.0 in | Wt 254.0 lb

## 2011-10-02 DIAGNOSIS — Z853 Personal history of malignant neoplasm of breast: Secondary | ICD-10-CM

## 2011-10-02 DIAGNOSIS — R609 Edema, unspecified: Secondary | ICD-10-CM

## 2011-10-02 DIAGNOSIS — I10 Essential (primary) hypertension: Secondary | ICD-10-CM

## 2011-10-02 DIAGNOSIS — E669 Obesity, unspecified: Secondary | ICD-10-CM

## 2011-10-02 DIAGNOSIS — J029 Acute pharyngitis, unspecified: Secondary | ICD-10-CM

## 2011-10-02 LAB — POCT RAPID STREP A (OFFICE): Rapid Strep A Screen: NEGATIVE

## 2011-10-05 ENCOUNTER — Telehealth: Payer: Self-pay | Admitting: Internal Medicine

## 2011-10-05 DIAGNOSIS — I10 Essential (primary) hypertension: Secondary | ICD-10-CM

## 2011-10-05 DIAGNOSIS — R609 Edema, unspecified: Secondary | ICD-10-CM | POA: Insufficient documentation

## 2011-10-05 DIAGNOSIS — Z853 Personal history of malignant neoplasm of breast: Secondary | ICD-10-CM

## 2011-10-05 HISTORY — DX: Edema, unspecified: R60.9

## 2011-10-05 HISTORY — DX: Essential (primary) hypertension: I10

## 2011-10-05 HISTORY — DX: Morbid (severe) obesity due to excess calories: E66.01

## 2011-10-05 HISTORY — DX: Personal history of malignant neoplasm of breast: Z85.3

## 2011-10-05 NOTE — Telephone Encounter (Signed)
Pt advised to continue amoxicillin and to come by and pick up samples of Zutipro.  Pt verbalized understanding.

## 2011-10-05 NOTE — Progress Notes (Signed)
  Subjective:    Patient ID: [name], female    DOB: Aug 15, 1961, 50 y.o.   MRN: [id number]  HPI 50 year old white female with history of hypertension, obesity, dependent edema, remote history of breast cancer used to be a patient here. She married and moved to [place]. She is here visiting her parents and has come down with an upper respiratory infection and we agreed to see her today. Has cough and congestion. No fever. Father has similar illness. No shaking chills. Cough is nonproductive. Throat is slightly sore.    Review of Systems     Objective:   Physical Exam pharynx is slightly injected. TMs are clear. Neck is supple without significant adenopathy or thyromegaly. Chest is clear.        Assessment & Plan:  Upper respiratory infection  Plan: Amoxicillin 500 mg by mouth 3 times a day for 10 days.

## 2011-10-05 NOTE — Telephone Encounter (Signed)
Continue Amoxicillin as prescribed. See later this week if not improved. We have some samples of Zutipro for cough she may come by and get. MJB

## 2012-12-16 DIAGNOSIS — R7309 Other abnormal glucose: Secondary | ICD-10-CM | POA: Insufficient documentation

## 2012-12-16 DIAGNOSIS — R609 Edema, unspecified: Secondary | ICD-10-CM | POA: Insufficient documentation

## 2012-12-23 ENCOUNTER — Telehealth: Payer: Self-pay | Admitting: *Deleted

## 2012-12-23 NOTE — Telephone Encounter (Signed)
Script stating Right Breast prosthesis & supplies prn mailed to pt at [address] [place], [place] [address].

## 2012-12-23 NOTE — Telephone Encounter (Signed)
Pt called asking for a script for her prosthesis.  She is in [place] now & doesn't have an oncologist there but does have a PCP but apparently her insurance won't accept a script from anyone other than an oncologist.  She asked that it be mailed to her. She needs the script to take to her fitting for 12/30/11.

## 2013-06-13 DIAGNOSIS — R32 Unspecified urinary incontinence: Secondary | ICD-10-CM

## 2013-06-13 HISTORY — DX: Unspecified urinary incontinence: R32

## 2014-03-13 DIAGNOSIS — Z9011 Acquired absence of right breast and nipple: Secondary | ICD-10-CM | POA: Insufficient documentation

## 2014-03-13 HISTORY — DX: Acquired absence of right breast and nipple: Z90.11

## 2014-06-20 DIAGNOSIS — I4891 Unspecified atrial fibrillation: Secondary | ICD-10-CM

## 2014-06-20 HISTORY — DX: Unspecified atrial fibrillation: I48.91

## 2014-08-23 DIAGNOSIS — F419 Anxiety disorder, unspecified: Secondary | ICD-10-CM | POA: Insufficient documentation

## 2014-08-23 HISTORY — DX: Anxiety disorder, unspecified: F41.9

## 2014-08-29 DIAGNOSIS — Z7901 Long term (current) use of anticoagulants: Secondary | ICD-10-CM | POA: Insufficient documentation

## 2014-08-29 DIAGNOSIS — Z79899 Other long term (current) drug therapy: Secondary | ICD-10-CM | POA: Insufficient documentation

## 2014-08-29 HISTORY — DX: Long term (current) use of anticoagulants: Z79.01

## 2014-09-20 DIAGNOSIS — F32A Depression, unspecified: Secondary | ICD-10-CM | POA: Insufficient documentation

## 2014-10-26 ENCOUNTER — Emergency Department (HOSPITAL_COMMUNITY)
Admission: EM | Admit: 2014-10-26 | Discharge: 2014-10-26 | Disposition: A | Discharge status: Home / Self Care | Payer: BC Managed Care – PPO | Attending: Emergency Medicine | Admitting: Emergency Medicine

## 2014-10-26 ENCOUNTER — Encounter (HOSPITAL_COMMUNITY): Payer: Self-pay | Admitting: Emergency Medicine

## 2014-10-26 DIAGNOSIS — K219 Gastro-esophageal reflux disease without esophagitis: Secondary | ICD-10-CM | POA: Diagnosis not present

## 2014-10-26 DIAGNOSIS — Z853 Personal history of malignant neoplasm of breast: Secondary | ICD-10-CM | POA: Insufficient documentation

## 2014-10-26 DIAGNOSIS — I480 Paroxysmal atrial fibrillation: Secondary | ICD-10-CM

## 2014-10-26 DIAGNOSIS — E119 Type 2 diabetes mellitus without complications: Secondary | ICD-10-CM | POA: Insufficient documentation

## 2014-10-26 DIAGNOSIS — Z79899 Other long term (current) drug therapy: Secondary | ICD-10-CM | POA: Insufficient documentation

## 2014-10-26 DIAGNOSIS — Z7982 Long term (current) use of aspirin: Secondary | ICD-10-CM | POA: Insufficient documentation

## 2014-10-26 DIAGNOSIS — Z87891 Personal history of nicotine dependence: Secondary | ICD-10-CM | POA: Insufficient documentation

## 2014-10-26 DIAGNOSIS — Z7902 Long term (current) use of antithrombotics/antiplatelets: Secondary | ICD-10-CM | POA: Diagnosis not present

## 2014-10-26 DIAGNOSIS — J45909 Unspecified asthma, uncomplicated: Secondary | ICD-10-CM | POA: Diagnosis not present

## 2014-10-26 DIAGNOSIS — I10 Essential (primary) hypertension: Secondary | ICD-10-CM | POA: Diagnosis not present

## 2014-10-26 DIAGNOSIS — I4891 Unspecified atrial fibrillation: Secondary | ICD-10-CM | POA: Diagnosis present

## 2014-10-26 HISTORY — DX: Unspecified atrial fibrillation: I48.91

## 2014-10-26 LAB — BASIC METABOLIC PANEL
Anion gap: 15 (ref 5–15)
BUN: 14 mg/dL (ref 6–23)
CO2: 23 mEq/L (ref 19–32)
Calcium: 8.9 mg/dL (ref 8.4–10.5)
Chloride: 104 mEq/L (ref 96–112)
Creatinine, Ser: 0.53 mg/dL (ref 0.50–1.10)
GFR calc Af Amer: >90 mL/min (ref >90)
GFR calc non Af Amer: >90 mL/min (ref >90)
Glucose, Bld: 118 mg/dL — ABNORMAL HIGH (ref 70–99)
Potassium: 4.1 mEq/L (ref 3.7–5.3)
Sodium: 142 mEq/L (ref 137–147)

## 2014-10-26 LAB — CBC WITH DIFFERENTIAL/PLATELET
Basophils Absolute: 0 10*3/uL (ref 0.0–0.1)
Basophils Relative: 0 % (ref 0–1)
Eosinophils Absolute: 0.1 10*3/uL (ref 0.0–0.7)
Eosinophils Relative: 1 % (ref 0–5)
HCT: 39.1 % (ref 36.0–46.0)
Hemoglobin: 13.3 g/dL (ref 12.0–15.0)
Lymphocytes Relative: 19 % (ref 12–46)
Lymphs Abs: 1.9 10*3/uL (ref 0.7–4.0)
MCH: 29.6 pg (ref 26.0–34.0)
MCHC: 34 g/dL (ref 30.0–36.0)
MCV: 87.1 fL (ref 78.0–100.0)
Monocytes Absolute: 0.8 10*3/uL (ref 0.1–1.0)
Monocytes Relative: 8 % (ref 3–12)
Neutro Abs: 7 10*3/uL (ref 1.7–7.7)
Neutrophils Relative %: 72 % (ref 43–77)
Platelets: 292 10*3/uL (ref 150–400)
RBC: 4.49 MIL/uL (ref 3.87–5.11)
RDW: 13.2 % (ref 11.5–15.5)
WBC: 9.7 10*3/uL (ref 4.0–10.5)

## 2014-10-26 LAB — TROPONIN I: Troponin I: <0.3 ng/mL (ref <0.30)

## 2014-10-26 NOTE — ED Provider Notes (Signed)
CSN: [id number]     Arrival date & time 10/26/14  7322 History   First MD Initiated Contact with Patient 10/26/14 [phone]     Chief Complaint  Patient presents with  . Atrial Fibrillation     (Consider location/radiation/quality/duration/timing/severity/associated sxs/prior Treatment) HPI Comments: PT with afib on flecainide, dilt and eliquis comes in with palpitations that started tonight, unprovoked. Denies recent infection, med changes or non compliance.   Patient is a 53 y.o. female presenting with atrial fibrillation. The history is provided by the patient.  Atrial Fibrillation This is a chronic problem. The current episode started 3 to 5 hours ago. The problem has been resolved. Pertinent negatives include no chest pain, no abdominal pain, no headaches and no shortness of breath. She has tried nothing for the symptoms.    Past Medical History  Diagnosis Date  . Breast cancer   . GE reflux   . Asthma   . HTN (hypertension)   . Type II or unspecified type diabetes mellitus without mention of complication, not stated as uncontrolled   . Atrial fibrillation 06/2014   History reviewed. No pertinent past surgical history. History reviewed. No pertinent family history. History  Substance Use Topics  . Smoking status: Former Smoker -- 0.30 packs/day for 4 years    Types: Cigarettes    Quit date: 12/22/1983  . Smokeless tobacco: Not on file  . Alcohol Use: Not on file   OB History    No data available     Review of Systems  Constitutional: Negative for activity change.  Respiratory: Negative for shortness of breath.   Cardiovascular: Positive for palpitations. Negative for chest pain.  Gastrointestinal: Negative for nausea, vomiting and abdominal pain.  Genitourinary: Negative for dysuria.  Musculoskeletal: Negative for neck pain.  Neurological: Negative for headaches.      Allergies  Erythromycin  Home Medications   Prior to Admission medications   Medication Sig  Start Date End Date Taking? Authorizing Provider  albuterol (PROVENTIL HFA;VENTOLIN HFA) 108 (90 BASE) MCG/ACT inhaler Inhale 2 puffs into the lungs every 6 (six) hours as needed for wheezing or shortness of breath.   Yes Historical Provider, MD  apixaban (ELIQUIS) 5 MG TABS tablet Take 5 mg by mouth 2 (two) times daily.   Yes Historical Provider, MD  aspirin EC 81 MG tablet Take 81 mg by mouth daily.   Yes Historical Provider, MD  cetirizine (ZYRTEC) 10 MG tablet Take 10 mg by mouth daily.   Yes Historical Provider, MD  diltiazem (DILACOR XR) 120 MG 24 hr capsule Take 120 mg by mouth daily.   Yes Historical Provider, MD  flecainide (TAMBOCOR) 50 MG tablet Take 50 mg by mouth 2 (two) times daily.   Yes Historical Provider, MD  furosemide (LASIX) 20 MG tablet Take 20 mg by mouth daily.     Yes Historical Provider, MD  lisinopril (PRINIVIL,ZESTRIL) 20 MG tablet Take 20 mg by mouth 2 (two) times daily.   Yes Historical Provider, MD  ranitidine (ZANTAC) 150 MG tablet Take 150 mg by mouth 2 (two) times daily.   Yes Historical Provider, MD  sertraline (ZOLOFT) 50 MG tablet Take 50 mg by mouth daily.   Yes Historical Provider, MD  ALBUTEROL IN Inhale into the lungs as needed.      Historical Provider, MD  amLODipine (NORVASC) 5 MG tablet Take 5 mg by mouth daily.      Historical Provider, MD  aspirin 81 MG tablet Take 81 mg by mouth daily.  Historical Provider, MD  buPROPion (WELLBUTRIN XL) 150 MG 24 hr tablet Take 150 mg by mouth daily.      Historical Provider, MD  ramipril (ALTACE) 10 MG capsule Take 10 mg by mouth daily.      Historical Provider, MD  ranitidine (ZANTAC) 150 MG tablet Take 150 mg by mouth daily.      Historical Provider, MD   BP 117/71 mmHg  Pulse 77  Resp 18  SpO2 96% Physical Exam  Constitutional: She is oriented to person, place, and time. She appears well-developed and well-nourished.  HENT:  Head: Normocephalic and atraumatic.  Eyes: EOM are normal. Pupils are equal,  round, and reactive to light.  Neck: Neck supple.  Cardiovascular: Normal rate, regular rhythm and normal heart sounds.   No murmur heard. Pulmonary/Chest: Effort normal. No respiratory distress.  Abdominal: Soft. She exhibits no distension. There is no tenderness. There is no rebound and no guarding.  Neurological: She is alert and oriented to person, place, and time.  Skin: Skin is warm and dry.  Nursing note and vitals reviewed.   ED Course  Procedures (including critical care time) Labs Review Labs Reviewed  BASIC METABOLIC PANEL - Abnormal; Notable for the following:    Glucose, Bld 118 (*)    All other components within normal limits  TROPONIN I  CBC WITH DIFFERENTIAL    Imaging Review No results found.   EKG Interpretation None       Date: 10/26/2014  Rate: 110  Rhythm: atrial fibrillation  QRS Axis: normal  Intervals: normal  ST/T Wave abnormalities: nonspecific ST/T changes  Conduction Disutrbances:none  Narrative Interpretation:   Old EKG Reviewed: none available   MDM   Final diagnoses:  Paroxysmal atrial fibrillation    Pt comes in with palpitations. Afib with HR of 110 at arrival. Pt was in regular rhythm in the 70s during my evaluation. She is on rate, rhythm control and anticoagulation. Monitored for extended period of time, and did well. Stable for d.c.  Varney Biles, MD 10/26/14 [phone]

## 2014-10-26 NOTE — Discharge Instructions (Signed)
We saw you in the ER for the atrial fibrillation and fast heart rate. All the results in the ER are normal, and you converted back to normal rhythm spontaneously. There is nothing further to do from the ER, and you should continues taking meds as prescribed and contact your Cardiologist.   Atrial Fibrillation Atrial fibrillation is a type of irregular heart rhythm (arrhythmia). During atrial fibrillation, the upper chambers of the heart (atria) quiver continuously in a chaotic pattern. This causes an irregular and often rapid heart rate.  Atrial fibrillation is the result of the heart becoming overloaded with disorganized signals that tell it to beat. These signals are normally released one at a time by a part of the right atrium called the sinoatrial node. They then travel from the atria to the lower chambers of the heart (ventricles), causing the atria and ventricles to contract and pump blood as they pass. In atrial fibrillation, parts of the atria outside of the sinoatrial node also release these signals. This results in two problems. First, the atria receive so many signals that they do not have time to fully contract. Second, the ventricles, which can only receive one signal at a time, beat irregularly and out of rhythm with the atria.  There are three types of atrial fibrillation:   Paroxysmal. Paroxysmal atrial fibrillation starts suddenly and stops on its own within a week.  Persistent. Persistent atrial fibrillation lasts for more than a week. It may stop on its own or with treatment.  Permanent. Permanent atrial fibrillation does not go away. Episodes of atrial fibrillation may lead to permanent atrial fibrillation. Atrial fibrillation can prevent your heart from pumping blood normally. It increases your risk of stroke and can lead to heart failure.  CAUSES   Heart conditions, including a heart attack, heart failure, coronary artery disease, and heart valve conditions.   Inflammation  of the sac that surrounds the heart (pericarditis).  Blockage of an artery in the lungs (pulmonary embolism).  Pneumonia or other infections.  Chronic lung disease.  Thyroid problems, especially if the thyroid is overactive (hyperthyroidism).  Caffeine, excessive alcohol use, and use of some illegal drugs.   Use of some medicines, including certain decongestants and diet pills.  Heart surgery.   Birth defects.  Sometimes, no cause can be found. When this happens, the atrial fibrillation is called lone atrial fibrillation. The risk of complications from atrial fibrillation increases if you have lone atrial fibrillation and you are age 64 years or older. RISK FACTORS  Heart failure.  Coronary artery disease.  Diabetes mellitus.   High blood pressure (hypertension).   Obesity.   Other arrhythmias.   Increased age. SIGNS AND SYMPTOMS   A feeling that your heart is beating rapidly or irregularly.   A feeling of discomfort or pain in your chest.   Shortness of breath.   Sudden light-headedness or weakness.   Getting tired easily when exercising.   Urinating more often than normal (mainly when atrial fibrillation first begins).  In paroxysmal atrial fibrillation, symptoms may start and suddenly stop. DIAGNOSIS  Your health care provider may be able to detect atrial fibrillation when taking your pulse. Your health care provider may have you take a test called an ambulatory electrocardiogram (ECG). An ECG records your heartbeat patterns over a 24-hour period. You may also have other tests, such as:  Transthoracic echocardiogram (TTE). During echocardiography, sound waves are used to evaluate how blood flows through your heart.  Transesophageal echocardiogram (TEE).  Stress  test. There is more than one type of stress test. If a stress test is needed, ask your health care provider about which type is best for you.  Chest X-ray exam.  Blood  tests.  Computed tomography (CT). TREATMENT  Treatment may include:  Treating any underlying conditions. For example, if you have an overactive thyroid, treating the condition may correct atrial fibrillation.  Taking medicine. Medicines may be given to control a rapid heart rate or to prevent blood clots, heart failure, or a stroke.  Having a procedure to correct the rhythm of the heart:  Electrical cardioversion. During electrical cardioversion, a controlled, low-energy shock is delivered to the heart through your skin. If you have chest pain, very low blood pressure, or sudden heart failure, this procedure may need to be done as an emergency.  Catheter ablation. During this procedure, heart tissues that send the signals that cause atrial fibrillation are destroyed.  Surgical ablation. During this surgery, thin lines of heart tissue that carry the abnormal signals are destroyed. This procedure can either be an open-heart surgery or a minimally invasive surgery. With the minimally invasive surgery, small cuts are made to access the heart instead of a large opening.  Pulmonary venous isolation. During this surgery, tissue around the veins that carry blood from the lungs (pulmonary veins) is destroyed. This tissue is thought to carry the abnormal signals. HOME CARE INSTRUCTIONS   Take medicines only as directed by your health care provider. Some medicines can make atrial fibrillation worse or recur.  If blood thinners were prescribed by your health care provider, take them exactly as directed. Too much blood-thinning medicine can cause bleeding. If you take too little, you will not have the needed protection against stroke and other problems.  Perform blood tests at home if directed by your health care provider. Perform blood tests exactly as directed.  Quit smoking if you smoke.  Do not drink alcohol.  Do not drink caffeinated beverages such as coffee, soda, and some teas. You may drink  decaffeinated coffee, soda, or tea.   Maintain a healthy weight.Do not use diet pills unless your health care provider approves. They may make heart problems worse.   Follow diet instructions as directed by your health care provider.  Exercise regularly as directed by your health care provider.  Keep all follow-up visits as directed by your health care provider. This is important. PREVENTION  The following substances can cause atrial fibrillation to recur:   Caffeinated beverages.  Alcohol.  Certain medicines, especially those used for breathing problems.  Certain herbs and herbal medicines, such as those containing ephedra or ginseng.  Illegal drugs, such as cocaine and amphetamines. Sometimes medicines are given to prevent atrial fibrillation from recurring. Proper treatment of any underlying condition is also important in helping prevent recurrence.  SEEK MEDICAL CARE IF:  You notice a change in the rate, rhythm, or strength of your heartbeat.  You suddenly begin urinating more frequently.  You tire more easily when exerting yourself or exercising. SEEK IMMEDIATE MEDICAL CARE IF:   You have chest pain, abdominal pain, sweating, or weakness.  You feel nauseous.  You have shortness of breath.  You suddenly have swollen feet and ankles.  You feel dizzy.  Your face or limbs feel numb or weak.  You have a change in your vision or speech. MAKE SURE YOU:   Understand these instructions.  Will watch your condition.  Will get help right away if you are not doing well  or get worse. Document Released: 12/07/2005 Document Revised: 04/23/2014 Document Reviewed: 01/17/2013 [facility] Patient Information 2015 [place], [place]. This information is not intended to replace advice given to you by your health care provider. Make sure you discuss any questions you have with your health care provider.

## 2014-10-26 NOTE — ED Notes (Signed)
Pt presents to ED via EMS with c/o palpitations. States she woke up with feeling of fluttering. EMS reports HR-120-150 A-fib. Pt denies other symptoms. Hx of a-fib, diagnosed in July 2015.

## 2017-04-10 ENCOUNTER — Encounter (HOSPITAL_COMMUNITY): Payer: Self-pay | Admitting: Emergency Medicine

## 2017-04-10 ENCOUNTER — Ambulatory Visit (HOSPITAL_COMMUNITY)
Admission: EM | Admit: 2017-04-10 | Discharge: 2017-04-10 | Disposition: A | Discharge status: Home / Self Care | Payer: 59 | Attending: Family Medicine | Admitting: Family Medicine

## 2017-04-10 DIAGNOSIS — R05 Cough: Secondary | ICD-10-CM

## 2017-04-10 DIAGNOSIS — B9789 Other viral agents as the cause of diseases classified elsewhere: Secondary | ICD-10-CM

## 2017-04-10 DIAGNOSIS — J069 Acute upper respiratory infection, unspecified: Secondary | ICD-10-CM | POA: Diagnosis not present

## 2017-04-10 MED ORDER — IPRATROPIUM-ALBUTEROL 0.5-2.5 (3) MG/3ML IN SOLN
RESPIRATORY_TRACT | Status: AC
  Filled 2017-04-10: qty 3

## 2017-04-10 MED ORDER — ALBUTEROL SULFATE HFA 108 (90 BASE) MCG/ACT IN AERS: 2.0000 | INHALATION_SPRAY | Freq: Four times a day (QID) | RESPIRATORY_TRACT | 2 refills | Status: DC | PRN

## 2017-04-10 MED ORDER — PREDNISONE 20 MG PO TABS: 20.0000 mg | ORAL_TABLET | Freq: Every day | ORAL | 0 refills | Status: AC

## 2017-04-10 MED ORDER — IPRATROPIUM-ALBUTEROL 0.5-2.5 (3) MG/3ML IN SOLN
3.0000 mL | Freq: Once | RESPIRATORY_TRACT | Status: AC
  Administered 2017-04-10: 3 mL via RESPIRATORY_TRACT

## 2017-04-10 NOTE — ED Triage Notes (Signed)
Symptoms started Monday.  Initially patient had watery eyes head congestion, headache, started coughing Wednesday and has worsened.  Reports head and chest congestion.  Last night phlegm turned green.  Coughing woke patient.  This morning noticed tissues from last night had bloody streaks

## 2017-04-10 NOTE — Discharge Instructions (Signed)
May continue Delsym over-the-counter for her cough.  Please try honey, water gargle, throat lozenges for your sore throat. You may also try Chloraseptic spray.  Rest your voice. The prednisone will also help with few hoarseness.

## 2017-04-10 NOTE — ED Provider Notes (Signed)
CSN: [id number]     Arrival date & time 04/10/17  1201 History   First MD Initiated Contact with Patient 04/10/17 1225     Chief Complaint  Patient presents with  . URI   (Consider location/radiation/quality/duration/timing/severity/associated sxs/prior Treatment) Patient is a well-appearing 56 y.o. Female, here for 4 days duration of cold symptoms. Her symptoms started with a sore throat then later progressed to coughing, hoarseness, headache, nasal congestion, ear pain. She also endorses wheezing but denies SOB or CP. She have been taking delsym OTC for cough, which has helped.        Past Medical History:  Diagnosis Date  . Asthma   . Atrial fibrillation ([place]) 06/2014  . Breast cancer ([place])   . GE reflux   . HTN (hypertension)   . Type II or unspecified type diabetes mellitus without mention of complication, not stated as uncontrolled    Past Surgical History:  Procedure Laterality Date  . FINGER SURGERY Right   . MASTECTOMY Right    No family history on file. Social History  Substance Use Topics  . Smoking status: Former Smoker    Packs/day: 0.30    Years: 4.00    Types: Cigarettes    Quit date: 12/22/1983  . Smokeless tobacco: Not on file  . Alcohol use Yes   OB History    No data available     Review of Systems  Constitutional: Positive for fatigue. Negative for chills and fever.  HENT: Positive for congestion, ear pain, rhinorrhea, sinus pain, sinus pressure and sore throat.   Respiratory: Positive for cough and wheezing. Negative for shortness of breath.   Cardiovascular: Negative for chest pain.  Gastrointestinal: Negative for abdominal pain, nausea and vomiting.  Skin: Negative for rash.  Neurological: Positive for headaches. Negative for dizziness.    Allergies  Erythromycin  Home Medications   Prior to Admission medications   Medication Sig Start Date End Date Taking? Authorizing Provider  ALBUTEROL IN Inhale into the lungs as needed.     Yes  Historical Provider, MD  buPROPion (WELLBUTRIN XL) 150 MG 24 hr tablet Take 150 mg by mouth daily.     Yes Historical Provider, MD  cetirizine (ZYRTEC) 10 MG tablet Take 10 mg by mouth daily.   Yes Historical Provider, MD  fluticasone (VERAMYST) 27.5 MCG/SPRAY nasal spray Place 2 sprays into the nose daily.   Yes Historical Provider, MD  metFORMIN (GLUMETZA) 500 MG (MOD) 24 hr tablet Take 500 mg by mouth daily with breakfast.   Yes Historical Provider, MD  montelukast (SINGULAIR) 10 MG tablet Take 10 mg by mouth at bedtime.   Yes Historical Provider, MD  ranitidine (ZANTAC) 150 MG tablet Take 150 mg by mouth daily.     Yes Historical Provider, MD  ranitidine (ZANTAC) 150 MG tablet Take 150 mg by mouth 2 (two) times daily.   Yes Historical Provider, MD  albuterol (PROVENTIL HFA;VENTOLIN HFA) 108 (90 Base) MCG/ACT inhaler Inhale 2 puffs into the lungs every 6 (six) hours as needed for wheezing or shortness of breath. 04/10/17   Barry Dienes, NP  amLODipine (NORVASC) 5 MG tablet Take 5 mg by mouth daily.      Historical Provider, MD  apixaban (ELIQUIS) 5 MG TABS tablet Take 5 mg by mouth 2 (two) times daily.    Historical Provider, MD  aspirin 81 MG tablet Take 81 mg by mouth daily.      Historical Provider, MD  aspirin EC 81 MG tablet Take 81  mg by mouth daily.    Historical Provider, MD  diltiazem (DILACOR XR) 120 MG 24 hr capsule Take 120 mg by mouth daily.    Historical Provider, MD  flecainide (TAMBOCOR) 50 MG tablet Take 50 mg by mouth 2 (two) times daily.    Historical Provider, MD  furosemide (LASIX) 20 MG tablet Take 20 mg by mouth daily.      Historical Provider, MD  lisinopril (PRINIVIL,ZESTRIL) 20 MG tablet Take 20 mg by mouth 2 (two) times daily.    Historical Provider, MD  predniSONE (DELTASONE) 20 MG tablet Take 1 tablet (20 mg total) by mouth daily with breakfast. 60 mg on day 1-2, 40 mg on day 3-4, 20 mg on day 5-6. 04/10/17 04/16/17  Barry Dienes, NP  ramipril (ALTACE) 10 MG capsule Take 10  mg by mouth daily.      Historical Provider, MD  sertraline (ZOLOFT) 50 MG tablet Take 50 mg by mouth daily.    Historical Provider, MD   Meds Ordered and Administered this Visit   Medications  ipratropium-albuterol (DUONEB) 0.5-2.5 (3) MG/3ML nebulizer solution 3 mL (not administered)    BP 129/82 (BP Location: Left Arm)   Pulse 82   Temp 98.9 F (37.2 C) (Oral)   Resp 16   SpO2 97%  No data found.   Physical Exam  Constitutional: She is oriented to person, place, and time. She appears well-developed and well-nourished.  HENT:  Head: Normocephalic and atraumatic.  Right Ear: External ear normal.  Left Ear: External ear normal.  Nose: Nose normal.  Mouth/Throat: Oropharynx is clear and moist. No oropharyngeal exudate.  TM pearly gray bilaterally with no erythema  Eyes: Pupils are equal, round, and reactive to light.  Neck: Normal range of motion. Neck supple.  Cardiovascular: Normal rate, regular rhythm and normal heart sounds.   No murmur heard. Pulmonary/Chest: Effort normal. She has wheezes.  Abdominal: Soft. Bowel sounds are normal. She exhibits no distension. There is no tenderness.  Lymphadenopathy:    She has no cervical adenopathy.  Neurological: She is alert and oriented to person, place, and time.  Skin: Skin is warm and dry. No rash noted.  Nursing note and vitals reviewed.   Urgent Care Course     Procedures (including critical care time)  Labs Review Labs Reviewed - No data to display  Imaging Review No results found.  MDM   1. Viral URI with cough    Symptoms are most consistent with a viral illness.   May continue Delsym over-the-counter for her cough.  Please try honey, water gargle, throat lozenges for your sore throat. You may also try Chloraseptic spray.  Rest your voice. The prednisone will hopefully also help with the hoarseness.     Barry Dienes, NP 04/10/17 1242

## 2017-04-16 ENCOUNTER — Ambulatory Visit (HOSPITAL_COMMUNITY)
Admission: EM | Admit: 2017-04-16 | Discharge: 2017-04-16 | Disposition: A | Discharge status: Home / Self Care | Payer: 59 | Attending: Family Medicine | Admitting: Family Medicine

## 2017-04-16 ENCOUNTER — Encounter (HOSPITAL_COMMUNITY): Payer: Self-pay

## 2017-04-16 DIAGNOSIS — J209 Acute bronchitis, unspecified: Secondary | ICD-10-CM | POA: Diagnosis not present

## 2017-04-16 DIAGNOSIS — R059 Cough, unspecified: Secondary | ICD-10-CM

## 2017-04-16 DIAGNOSIS — R05 Cough: Secondary | ICD-10-CM

## 2017-04-16 MED ORDER — AZITHROMYCIN 250 MG PO TABS: 250.0000 mg | ORAL_TABLET | Freq: Every day | ORAL | 0 refills | Status: DC

## 2017-04-16 MED ORDER — IPRATROPIUM BROMIDE 0.06 % NA SOLN: 2.0000 | Freq: Four times a day (QID) | NASAL | 0 refills | Status: DC

## 2017-04-16 MED ORDER — BENZONATATE 100 MG PO CAPS: 200.0000 mg | ORAL_CAPSULE | Freq: Three times a day (TID) | ORAL | 0 refills | Status: DC | PRN

## 2017-04-16 NOTE — ED Provider Notes (Signed)
CSN: [id number]     Arrival date & time 04/16/17  1121 History   First MD Initiated Contact with Patient 04/16/17 1141     Chief Complaint  Patient presents with  . Cough   (Consider location/radiation/quality/duration/timing/severity/associated sxs/prior Treatment) Patient c/o cough and congestion and uri sx's for over a week.  She was seen a week ago and tx'd with prednisone taper and she states this has helped some.   The history is provided by the patient.  Cough  Cough characteristics:  Productive Sputum characteristics:  White Severity:  Moderate Onset quality:  Gradual Duration:  1 week Timing:  Constant Progression:  Worsening Chronicity:  New Context: upper respiratory infection and weather changes   Relieved by:  Nothing Worsened by:  Nothing Ineffective treatments:  None tried   Past Medical History:  Diagnosis Date  . Asthma   . Atrial fibrillation ([place]) 06/2014  . Breast cancer ([place])   . GE reflux   . HTN (hypertension)   . Type II or unspecified type diabetes mellitus without mention of complication, not stated as uncontrolled    Past Surgical History:  Procedure Laterality Date  . FINGER SURGERY Right   . MASTECTOMY Right    No family history on file. Social History  Substance Use Topics  . Smoking status: Former Smoker    Packs/day: 0.30    Years: 4.00    Types: Cigarettes    Quit date: 12/22/1983  . Smokeless tobacco: Never Used  . Alcohol use Yes   OB History    No data available     Review of Systems  Constitutional: Negative.   HENT: Negative.   Eyes: Negative.   Respiratory: Positive for cough.   Cardiovascular: Negative.   Gastrointestinal: Negative.   Endocrine: Negative.   Genitourinary: Negative.   Musculoskeletal: Negative.   Allergic/Immunologic: Negative.   Neurological: Negative.   Hematological: Negative.   Psychiatric/Behavioral: Negative.     Allergies  Erythromycin  Home Medications   Prior to Admission  medications   Medication Sig Start Date End Date Taking? Authorizing Provider  albuterol (PROVENTIL HFA;VENTOLIN HFA) 108 (90 Base) MCG/ACT inhaler Inhale 2 puffs into the lungs every 6 (six) hours as needed for wheezing or shortness of breath. 04/10/17  Yes Barry Dienes, NP  ALBUTEROL IN Inhale into the lungs as needed.     Yes Historical Provider, MD  amLODipine (NORVASC) 5 MG tablet Take 5 mg by mouth daily.     Yes Historical Provider, MD  apixaban (ELIQUIS) 5 MG TABS tablet Take 5 mg by mouth 2 (two) times daily.   Yes Historical Provider, MD  aspirin 81 MG tablet Take 81 mg by mouth daily.     Yes Historical Provider, MD  aspirin EC 81 MG tablet Take 81 mg by mouth daily.   Yes Historical Provider, MD  buPROPion (WELLBUTRIN XL) 150 MG 24 hr tablet Take 150 mg by mouth daily.     Yes Historical Provider, MD  cetirizine (ZYRTEC) 10 MG tablet Take 10 mg by mouth daily.   Yes Historical Provider, MD  diltiazem (DILACOR XR) 120 MG 24 hr capsule Take 120 mg by mouth daily.   Yes Historical Provider, MD  flecainide (TAMBOCOR) 50 MG tablet Take 50 mg by mouth 2 (two) times daily.   Yes Historical Provider, MD  fluticasone (VERAMYST) 27.5 MCG/SPRAY nasal spray Place 2 sprays into the nose daily.   Yes Historical Provider, MD  furosemide (LASIX) 20 MG tablet Take 20 mg by  mouth daily.     Yes Historical Provider, MD  lisinopril (PRINIVIL,ZESTRIL) 20 MG tablet Take 20 mg by mouth 2 (two) times daily.   Yes Historical Provider, MD  metFORMIN (GLUMETZA) 500 MG (MOD) 24 hr tablet Take 500 mg by mouth daily with breakfast.   Yes Historical Provider, MD  montelukast (SINGULAIR) 10 MG tablet Take 10 mg by mouth at bedtime.   Yes Historical Provider, MD  predniSONE (DELTASONE) 20 MG tablet Take 1 tablet (20 mg total) by mouth daily with breakfast. 60 mg on day 1-2, 40 mg on day 3-4, 20 mg on day 5-6. 04/10/17 04/16/17 Yes Barry Dienes, NP  ramipril (ALTACE) 10 MG capsule Take 10 mg by mouth daily.     Yes Historical  Provider, MD  ranitidine (ZANTAC) 150 MG tablet Take 150 mg by mouth daily.     Yes Historical Provider, MD  ranitidine (ZANTAC) 150 MG tablet Take 150 mg by mouth 2 (two) times daily.   Yes Historical Provider, MD  sertraline (ZOLOFT) 50 MG tablet Take 50 mg by mouth daily.   Yes Historical Provider, MD  azithromycin (ZITHROMAX) 250 MG tablet Take 1 tablet (250 mg total) by mouth daily. Take first 2 tablets together, then 1 every day until finished. 04/16/17   Lysbeth Penner, FNP  benzonatate (TESSALON) 100 MG capsule Take 2 capsules (200 mg total) by mouth 3 (three) times daily as needed for cough. 04/16/17   Lysbeth Penner, FNP  ipratropium (ATROVENT) 0.06 % nasal spray Place 2 sprays into both nostrils 4 (four) times daily. 04/16/17   Lysbeth Penner, FNP   Meds Ordered and Administered this Visit  Medications - No data to display  BP (!) 155/70 (BP Location: Left Arm)   Pulse 89   Temp 98.3 F (36.8 C) (Oral)   Resp 20   SpO2 98%  No data found.   Physical Exam  Constitutional: She is oriented to person, place, and time. She appears well-developed and well-nourished.  HENT:  Head: Normocephalic and atraumatic.  Right Ear: External ear normal.  Left Ear: External ear normal.  Mouth/Throat: Oropharynx is clear and moist.  Eyes: Conjunctivae and EOM are normal. Pupils are equal, round, and reactive to light.  Neck: Normal range of motion. Neck supple.  Cardiovascular: Normal rate, regular rhythm and normal heart sounds.   Pulmonary/Chest: Effort normal and breath sounds normal.  Abdominal: Soft. Bowel sounds are normal.  Neurological: She is alert and oriented to person, place, and time.  Nursing note and vitals reviewed.   Urgent Care Course     Procedures (including critical care time)  Labs Review Labs Reviewed - No data to display  Imaging Review No results found.   Visual Acuity Review  Right Eye Distance:   Left Eye Distance:   Bilateral Distance:     Right Eye Near:   Left Eye Near:    Bilateral Near:         MDM   1. Acute bronchitis, unspecified organism   2. Cough    Zpak Ipratropium nasal spray Tessalon perles  Continue albuterol MDI  Push po fluids, rest, tylenol and motrin otc prn as directed for fever, arthralgias, and myalgias.  Follow up prn if sx's continue or persist.    Lysbeth Penner, FNP 04/16/17 1217

## 2017-04-16 NOTE — ED Triage Notes (Signed)
Pt was just here not getting any better. Productive cough, bilateral ear pain, sore throat. No fever. Has finished prednisone but still feels bad. Using delsym which is barely helping. It's worse at night, unable to sleep.

## 2017-08-04 ENCOUNTER — Encounter (HOSPITAL_COMMUNITY): Payer: Self-pay | Admitting: Family Medicine

## 2017-08-04 ENCOUNTER — Ambulatory Visit (HOSPITAL_COMMUNITY)
Admission: EM | Admit: 2017-08-04 | Discharge: 2017-08-04 | Disposition: A | Discharge status: Home / Self Care | Payer: 59 | Attending: Family Medicine | Admitting: Family Medicine

## 2017-08-04 DIAGNOSIS — J40 Bronchitis, not specified as acute or chronic: Secondary | ICD-10-CM

## 2017-08-04 MED ORDER — PREDNISONE 20 MG PO TABS: ORAL_TABLET | ORAL | 0 refills | Status: DC

## 2017-08-04 MED ORDER — BENZONATATE 100 MG PO CAPS: 100.0000 mg | ORAL_CAPSULE | Freq: Three times a day (TID) | ORAL | 0 refills | Status: DC | PRN

## 2017-08-04 NOTE — ED Triage Notes (Signed)
Pt  Reports   Symptoms   Of  Cough      Congestion          With   Symptoms         Of  Tightness  In  Chest   For   Several days     -  Pt    Reports     Shortness  Of      Breath     As   Well   Gets  Shortness  Of  Breath

## 2017-08-04 NOTE — ED Provider Notes (Signed)
[facility]    CSN: [id number] Arrival date & time: 08/04/17  1429     History   Chief Complaint Chief Complaint  Patient presents with  . Cough    HPI [name] is a 56 y.o. female.   Is a 56 year old woman who comes in with 1 week of cough and wheezing. She's had a low-grade fever of 99.3 on occasion. She also has some nasal congestion and rhinorrhea. She has an inhaler which has not been helping.      Past Medical History:  Diagnosis Date  . Asthma   . Atrial fibrillation ([place]) 06/2014  . Breast cancer ([place])   . GE reflux   . HTN (hypertension)   . Type II or unspecified type diabetes mellitus without mention of complication, not stated as uncontrolled     Patient Active Problem List   Diagnosis Date Noted  . Hypertension 10/05/2011  . Dependent edema 10/05/2011  . History of breast cancer 10/05/2011  . Obesity 10/05/2011    Past Surgical History:  Procedure Laterality Date  . FINGER SURGERY Right   . MASTECTOMY Right     OB History    No data available       Home Medications    Prior to Admission medications   Medication Sig Start Date End Date Taking? Authorizing Provider  albuterol (PROVENTIL HFA;VENTOLIN HFA) 108 (90 Base) MCG/ACT inhaler Inhale 2 puffs into the lungs every 6 (six) hours as needed for wheezing or shortness of breath. 04/10/17   Barry Dienes, NP  ALBUTEROL IN Inhale into the lungs as needed.      [provider]  amLODipine (NORVASC) 5 MG tablet Take 5 mg by mouth daily.      [provider]  apixaban (ELIQUIS) 5 MG TABS tablet Take 5 mg by mouth 2 (two) times daily.    [provider]  aspirin 81 MG tablet Take 81 mg by mouth daily.      [provider]  aspirin EC 81 MG tablet Take 81 mg by mouth daily.    [provider]  azithromycin (ZITHROMAX) 250 MG tablet Take 1 tablet (250 mg total) by mouth daily. Take first 2 tablets together, then 1 every day until finished.  04/16/17   Lysbeth Penner, FNP  benzonatate (TESSALON) 100 MG capsule Take 1-2 capsules (100-200 mg total) by mouth 3 (three) times daily as needed for cough. 08/04/17   Robyn Haber, MD  buPROPion (WELLBUTRIN XL) 150 MG 24 hr tablet Take 150 mg by mouth daily.      [provider]  cetirizine (ZYRTEC) 10 MG tablet Take 10 mg by mouth daily.    [provider]  diltiazem (DILACOR XR) 120 MG 24 hr capsule Take 120 mg by mouth daily.    [provider]  flecainide (TAMBOCOR) 50 MG tablet Take 50 mg by mouth 2 (two) times daily.    [provider]  fluticasone (VERAMYST) 27.5 MCG/SPRAY nasal spray Place 2 sprays into the nose daily.    [provider]  furosemide (LASIX) 20 MG tablet Take 20 mg by mouth daily.      [provider]  ipratropium (ATROVENT) 0.06 % nasal spray Place 2 sprays into both nostrils 4 (four) times daily. 04/16/17   Lysbeth Penner, FNP  lisinopril (PRINIVIL,ZESTRIL) 20 MG tablet Take 20 mg by mouth 2 (two) times daily.    [provider]  metFORMIN (GLUMETZA) 500 MG (MOD) 24 hr  tablet Take 500 mg by mouth daily with breakfast.    [provider]  montelukast (SINGULAIR) 10 MG tablet Take 10 mg by mouth at bedtime.    [provider]  predniSONE (DELTASONE) 20 MG tablet Two daily with food 08/04/17   Robyn Haber, MD  ramipril (ALTACE) 10 MG capsule Take 10 mg by mouth daily.      [provider]  ranitidine (ZANTAC) 150 MG tablet Take 150 mg by mouth daily.      [provider]  ranitidine (ZANTAC) 150 MG tablet Take 150 mg by mouth 2 (two) times daily.    [provider]  sertraline (ZOLOFT) 50 MG tablet Take 50 mg by mouth daily.    [provider]    Family History History reviewed. No pertinent family history.  Social History Social History  Substance Use Topics  . Smoking status: Former Smoker    Packs/day: 0.30    Years: 4.00    Types:  Cigarettes    Quit date: 12/22/1983  . Smokeless tobacco: Never Used  . Alcohol use Yes     Allergies   Erythromycin   Review of Systems Review of Systems  Constitutional: Positive for fatigue.  HENT: Positive for congestion.   Respiratory: Positive for cough and wheezing.   All other systems reviewed and are negative.    Physical Exam Triage Vital Signs ED Triage Vitals [08/04/17 1553]  Enc Vitals Group     BP (!) 150/86     Pulse Rate 82     Resp 18     Temp 98.6 F (37 C)     Temp Source Oral     SpO2 98 %     Weight      Height      Head Circumference      Peak Flow      Pain Score      Pain Loc      Pain Edu?      Excl. in [place]?    No data found.   Updated Vital Signs BP (!) 150/86 (BP Location: Right Arm)   Pulse 82   Temp 98.6 F (37 C) (Oral)   Resp 18   SpO2 98%    Physical Exam  Constitutional: She is oriented to person, place, and time. She appears well-developed and well-nourished.  HENT:  Right Ear: External ear normal.  Left Ear: External ear normal.  Mouth/Throat: Oropharynx is clear and moist.  Eyes: Pupils are equal, round, and reactive to light. Conjunctivae are normal.  Neck: Normal range of motion. Neck supple.  Cardiovascular: Normal rate, regular rhythm and normal heart sounds.   Pulmonary/Chest: Effort normal. She has wheezes.  Musculoskeletal: Normal range of motion.  Neurological: She is alert and oriented to person, place, and time.  Skin: Skin is warm and dry.  Nursing note and vitals reviewed.    UC Treatments / Results  Labs (all labs ordered are listed, but only abnormal results are displayed) Labs Reviewed - No data to display  EKG  EKG Interpretation None       Radiology No results found.  Procedures Procedures (including critical care time)  Medications Ordered in UC Medications - No data to display   Initial Impression / Assessment and Plan / UC Course  I have reviewed the triage vital signs and  the nursing notes.  Pertinent labs & imaging results that were available during my care of the patient were reviewed by me and considered  in my medical decision making (see chart for details).     Final Clinical Impressions(s) / UC Diagnoses   Final diagnoses:  Bronchitis    New Prescriptions New Prescriptions   BENZONATATE (TESSALON) 100 MG CAPSULE    Take 1-2 capsules (100-200 mg total) by mouth 3 (three) times daily as needed for cough.   PREDNISONE (DELTASONE) 20 MG TABLET    Two daily with food     Controlled Substance Prescriptions [place] Controlled Substance Registry consulted? Not Applicable   Robyn Haber, MD 08/04/17 1558

## 2017-08-04 NOTE — Discharge Instructions (Signed)
Continue the Flonase and the pulmonary inhaler as directed

## 2017-08-14 ENCOUNTER — Encounter (HOSPITAL_COMMUNITY): Payer: Self-pay | Admitting: Emergency Medicine

## 2017-08-14 ENCOUNTER — Ambulatory Visit (HOSPITAL_COMMUNITY)
Admission: EM | Admit: 2017-08-14 | Discharge: 2017-08-14 | Disposition: A | Discharge status: Home / Self Care | Payer: 59 | Attending: Family Medicine | Admitting: Family Medicine

## 2017-08-14 DIAGNOSIS — R059 Cough, unspecified: Secondary | ICD-10-CM

## 2017-08-14 DIAGNOSIS — R05 Cough: Secondary | ICD-10-CM | POA: Diagnosis not present

## 2017-08-14 MED ORDER — HYDROCODONE-HOMATROPINE 5-1.5 MG/5ML PO SYRP: 5.0000 mL | ORAL_SOLUTION | Freq: Four times a day (QID) | ORAL | 0 refills | Status: DC | PRN

## 2017-08-14 MED ORDER — AZITHROMYCIN 250 MG PO TABS: 250.0000 mg | ORAL_TABLET | Freq: Every day | ORAL | 0 refills | Status: DC

## 2017-08-14 NOTE — ED Triage Notes (Signed)
Pt here for persistent URI  Seen here on 8/15 for same reasons... Dx w/bronchitis Given prednisone and Tessalon [place] w/no relief.   Sx today include prod cough, laryngitis, bilateral ear pain  A&O x4... NAD... Ambulatory

## 2017-08-14 NOTE — ED Provider Notes (Signed)
  [place]   [id number] 08/14/17 Arrival Time: 8502  ASSESSMENT & PLAN:  1. Cough    Will cover for atypical. Medication sedation precautions. OTC as needed.  Meds ordered this encounter  Medications  . azithromycin (ZITHROMAX) 250 MG tablet    Sig: Take 1 tablet (250 mg total) by mouth daily. Take first 2 tablets together, then 1 every day until finished.    Dispense:  6 tablet    Refill:  0  . HYDROcodone-homatropine (HYCODAN) 5-1.5 MG/5ML syrup    Sig: Take 5 mLs by mouth every 6 (six) hours as needed for cough.    Dispense:  90 mL    Refill:  0   May f/u as needed.  Reviewed expectations re: course of current medical issues. Questions answered. Outlined signs and symptoms indicating need for more acute intervention. Patient verbalized understanding. After Visit Summary given.   SUBJECTIVE:  [name] is a 56 y.o. female who was seen here recently and dx with bronchitis. Finished prednisone. Not much change in symptoms. Persistent cough (nonproductive) bothering her the most. Interfering with sleep. Overall fatigue "running me down." Bilateral ear pressure. Normal PO intake. No n/v. OTC without relief. No SOB or wheezing reported. Non-smoker.   ROS: As per HPI.   OBJECTIVE:  Vitals:   08/14/17 1344  BP: (!) 147/75  Pulse: 87  Resp: 20  Temp: 98.9 F (37.2 C)  TempSrc: Oral  SpO2: 96%     General appearance: alert; no distress HEENT: nasal congestion; clear runny nose; throat irritation secondary to post-nasal drainage Neck: supple without LAD Lungs: clear to auscultation bilaterally Skin: warm and dry Psychological:  alert and cooperative; normal mood and affect   Allergies  Allergen Reactions  . Erythromycin Diarrhea    Past Medical History:  Diagnosis Date  . Asthma   . Atrial fibrillation ([place]) 06/2014  . Breast cancer ([place])   . GE reflux   . HTN (hypertension)   . Type II or unspecified type diabetes mellitus without  mention of complication, not stated as uncontrolled            Vanessa Kick, MD 08/14/17 1409

## 2017-12-31 DIAGNOSIS — F321 Major depressive disorder, single episode, moderate: Secondary | ICD-10-CM | POA: Insufficient documentation

## 2017-12-31 DIAGNOSIS — R7989 Other specified abnormal findings of blood chemistry: Secondary | ICD-10-CM | POA: Insufficient documentation

## 2017-12-31 HISTORY — DX: Major depressive disorder, single episode, moderate: F32.1

## 2018-01-25 DIAGNOSIS — R9439 Abnormal result of other cardiovascular function study: Secondary | ICD-10-CM | POA: Insufficient documentation

## 2018-02-16 HISTORY — PX: CARDIAC CATHETERIZATION: SHX172

## 2018-03-16 ENCOUNTER — Encounter (HOSPITAL_BASED_OUTPATIENT_CLINIC_OR_DEPARTMENT_OTHER): Payer: 59 | Attending: Physician Assistant

## 2018-03-16 DIAGNOSIS — Z79899 Other long term (current) drug therapy: Secondary | ICD-10-CM | POA: Insufficient documentation

## 2018-03-16 DIAGNOSIS — Z853 Personal history of malignant neoplasm of breast: Secondary | ICD-10-CM | POA: Diagnosis not present

## 2018-03-16 DIAGNOSIS — R609 Edema, unspecified: Secondary | ICD-10-CM | POA: Diagnosis not present

## 2018-03-16 DIAGNOSIS — Z7901 Long term (current) use of anticoagulants: Secondary | ICD-10-CM | POA: Insufficient documentation

## 2018-03-16 DIAGNOSIS — L97812 Non-pressure chronic ulcer of other part of right lower leg with fat layer exposed: Secondary | ICD-10-CM | POA: Diagnosis not present

## 2018-03-16 DIAGNOSIS — I480 Paroxysmal atrial fibrillation: Secondary | ICD-10-CM | POA: Diagnosis not present

## 2018-03-16 DIAGNOSIS — Z87891 Personal history of nicotine dependence: Secondary | ICD-10-CM | POA: Diagnosis not present

## 2018-03-16 DIAGNOSIS — E11622 Type 2 diabetes mellitus with other skin ulcer: Secondary | ICD-10-CM | POA: Insufficient documentation

## 2018-03-16 DIAGNOSIS — J449 Chronic obstructive pulmonary disease, unspecified: Secondary | ICD-10-CM | POA: Insufficient documentation

## 2018-03-16 DIAGNOSIS — E669 Obesity, unspecified: Secondary | ICD-10-CM | POA: Diagnosis not present

## 2018-03-16 DIAGNOSIS — Z7984 Long term (current) use of oral hypoglycemic drugs: Secondary | ICD-10-CM | POA: Insufficient documentation

## 2018-03-16 DIAGNOSIS — I10 Essential (primary) hypertension: Secondary | ICD-10-CM | POA: Diagnosis not present

## 2018-03-16 DIAGNOSIS — Z6841 Body Mass Index (BMI) 40.0 and over, adult: Secondary | ICD-10-CM | POA: Diagnosis not present

## 2018-03-23 ENCOUNTER — Encounter (HOSPITAL_BASED_OUTPATIENT_CLINIC_OR_DEPARTMENT_OTHER): Payer: 59 | Attending: Internal Medicine

## 2018-03-23 DIAGNOSIS — Z7901 Long term (current) use of anticoagulants: Secondary | ICD-10-CM | POA: Insufficient documentation

## 2018-03-23 DIAGNOSIS — L97819 Non-pressure chronic ulcer of other part of right lower leg with unspecified severity: Secondary | ICD-10-CM | POA: Insufficient documentation

## 2018-03-23 DIAGNOSIS — Z87891 Personal history of nicotine dependence: Secondary | ICD-10-CM | POA: Insufficient documentation

## 2018-03-23 DIAGNOSIS — I480 Paroxysmal atrial fibrillation: Secondary | ICD-10-CM | POA: Diagnosis not present

## 2018-03-23 DIAGNOSIS — E11622 Type 2 diabetes mellitus with other skin ulcer: Secondary | ICD-10-CM | POA: Diagnosis present

## 2018-03-23 DIAGNOSIS — E669 Obesity, unspecified: Secondary | ICD-10-CM | POA: Insufficient documentation

## 2018-03-23 DIAGNOSIS — I10 Essential (primary) hypertension: Secondary | ICD-10-CM | POA: Diagnosis not present

## 2018-03-23 DIAGNOSIS — J449 Chronic obstructive pulmonary disease, unspecified: Secondary | ICD-10-CM | POA: Insufficient documentation

## 2018-03-23 DIAGNOSIS — Z6841 Body Mass Index (BMI) 40.0 and over, adult: Secondary | ICD-10-CM | POA: Diagnosis not present

## 2018-04-06 DIAGNOSIS — E11622 Type 2 diabetes mellitus with other skin ulcer: Secondary | ICD-10-CM | POA: Diagnosis not present

## 2018-05-11 ENCOUNTER — Ambulatory Visit
Admission: RE | Admit: 2018-05-11 | Discharge: 2018-05-11 | Disposition: A | Discharge status: Home / Self Care | Payer: 59 | Source: Ambulatory Visit | Attending: Family Medicine | Admitting: Family Medicine

## 2018-05-11 ENCOUNTER — Other Ambulatory Visit: Payer: Self-pay | Admitting: Family Medicine

## 2018-05-11 DIAGNOSIS — J189 Pneumonia, unspecified organism: Secondary | ICD-10-CM

## 2018-09-06 DIAGNOSIS — R0600 Dyspnea, unspecified: Secondary | ICD-10-CM

## 2018-09-06 DIAGNOSIS — G4733 Obstructive sleep apnea (adult) (pediatric): Secondary | ICD-10-CM

## 2018-09-06 DIAGNOSIS — R0689 Other abnormalities of breathing: Secondary | ICD-10-CM

## 2018-09-06 DIAGNOSIS — J4530 Mild persistent asthma, uncomplicated: Secondary | ICD-10-CM | POA: Insufficient documentation

## 2018-09-06 DIAGNOSIS — Z23 Encounter for immunization: Secondary | ICD-10-CM | POA: Insufficient documentation

## 2018-09-06 DIAGNOSIS — R059 Cough, unspecified: Secondary | ICD-10-CM

## 2018-09-06 HISTORY — DX: Morbid (severe) obesity due to excess calories: E66.01

## 2018-09-06 HISTORY — DX: Obstructive sleep apnea (adult) (pediatric): G47.33

## 2018-09-06 HISTORY — DX: Cough, unspecified: R05.9

## 2018-09-06 HISTORY — DX: Mild persistent asthma, uncomplicated: J45.30

## 2018-09-06 HISTORY — DX: Other abnormalities of breathing: R06.89

## 2018-09-06 HISTORY — DX: Dyspnea, unspecified: R06.00

## 2018-10-10 ENCOUNTER — Encounter: Payer: Self-pay | Admitting: Pulmonary Disease

## 2018-10-10 ENCOUNTER — Ambulatory Visit (INDEPENDENT_AMBULATORY_CARE_PROVIDER_SITE_OTHER): Payer: 59 | Admitting: Pulmonary Disease

## 2018-10-10 VITALS — BP 112/82 | HR 102 | Ht 64.0 in | Wt 394.0 lb

## 2018-10-10 DIAGNOSIS — E669 Obesity, unspecified: Secondary | ICD-10-CM | POA: Diagnosis not present

## 2018-10-10 DIAGNOSIS — Z7189 Other specified counseling: Secondary | ICD-10-CM | POA: Diagnosis not present

## 2018-10-10 DIAGNOSIS — G4733 Obstructive sleep apnea (adult) (pediatric): Secondary | ICD-10-CM

## 2018-10-10 DIAGNOSIS — G4730 Sleep apnea, unspecified: Secondary | ICD-10-CM

## 2018-10-10 NOTE — Progress Notes (Signed)
[facility]  Chief Complaint  Patient presents with  . sleep consult    Pt here for Sleep Consult referred by Dr. Kenton Kingfisher MD. Pt had sleep study at [facility] in [place] on 09-29-18, they should be sending it over this week. Dr. Kenton Kingfisher is requesting pt be set up on cpap machine.    Constitutional:  BP 112/82 (BP Location: Left Arm, Cuff Size: Normal)   Pulse (!) 102   Ht 5\' 4"  (1.626 m)   Wt (!) 394 lb (178.7 kg)   SpO2 94%   BMI 67.63 kg/m   Past Medical History:  Asthma, A fib, Breast Cancer, GerD, HTN, DM  Brief Summary:  [name] is a 57 y.o. female with obstructive sleep apnea.  She had a sleep study in 2017 while living in [place].  She was found to have sleep apnea.  She was not able to get CPAP set up.  She has spent a lot of time in [place] over the past few years taking care of her parents.  Her father passed away about a year ago.  Unfortunately, her mother and aunt were killed in a car accident.    She was seen by Dr. Kenton Kingfisher with [facility].  She had sleep study earlier this month that showed severe sleep apnea.  She snores, and stops breathing at night.  She feels tired all the time.  She has trouble sleeping through the night.  She tries to modify her diet, but this has been a struggle related to family obligations and travelling between [place] and [place].   Physical Exam:   Appearance - well kempt  ENMT - clear nasal mucosa, midline nasal septum, no oral exudates, no LAN, trachea midline Respiratory - normal chest wall, normal respiratory effort, no accessory muscle use, no wheeze/rales CV - s1s2 regular rate and rhythm, no murmurs, no peripheral edema, radial pulses symmetric GI - soft, non tender, no masses Lymph - no adenopathy noted in neck and axillary areas MSK - normal muscle strength and tone, normal gait Ext - no cyanosis, clubbing, or joint inflammation noted Skin - no rashes, lesions, or ulcers Neuro  - oriented to person, place, and time Psych - normal mood and affect   Assessment/Plan:   Obstructive sleep apnea. - sleep study reviewed - discussed how sleep apnea can impact her health - driving precautions discussed - reviewed treatment options - will arrange for auto CPAP set up  Obesity. - discussed importance of weight loss and reviewed options to assist with this, including diet modification and moderate exercise regimen   Patient Instructions  Will arrange for CPAP set up  Follow up in 2 months after getting CPAP set up    Chesley Mires, MD [place] Pager: [phone] 10/10/2018, 3:22 PM  Flow Sheet     Pulmonary tests:  Spirometry 09/06/18 >> FEV1 2.20 (82%), FEV1% 79  Sleep tests:  PSG 09/30/18 >> AHI 76, SpO2 low 72%  Medications:   Allergies as of 10/10/2018      Reactions   Erythromycin Diarrhea      Medication List        Accurate as of 10/10/18  3:22 PM. Always use your most recent med list.          albuterol 108 (90 Base) MCG/ACT inhaler Commonly known as:  PROVENTIL HFA;VENTOLIN HFA Inhale 2 puffs into the lungs every 6 (six) hours as needed for wheezing or shortness of breath.  ALBUTEROL IN Inhale into the lungs as needed.   amLODipine 5 MG tablet Commonly known as:  NORVASC Take 5 mg by mouth daily.   aspirin 81 MG tablet Take 81 mg by mouth daily.   aspirin EC 81 MG tablet Take 81 mg by mouth daily.   benzonatate 100 MG capsule Commonly known as:  TESSALON Take 1-2 capsules (100-200 mg total) by mouth 3 (three) times daily as needed for cough.   buPROPion 150 MG 24 hr tablet Commonly known as:  WELLBUTRIN XL Take 150 mg by mouth daily.   cetirizine 10 MG tablet Commonly known as:  ZYRTEC Take 10 mg by mouth daily.   diltiazem 120 MG 24 hr capsule Commonly known as:  DILACOR XR Take 120 mg by mouth daily.   ELIQUIS 5 MG Tabs tablet Generic drug:  apixaban Take 5 mg by mouth 2 (two)  times daily.   flecainide 50 MG tablet Commonly known as:  TAMBOCOR Take 50 mg by mouth 2 (two) times daily.   fluticasone 27.5 MCG/SPRAY nasal spray Commonly known as:  VERAMYST Place 2 sprays into the nose daily.   furosemide 20 MG tablet Commonly known as:  LASIX Take 20 mg by mouth daily.   HYDROcodone-homatropine 5-1.5 MG/5ML syrup Commonly known as:  HYCODAN Take 5 mLs by mouth every 6 (six) hours as needed for cough.   ipratropium 0.06 % nasal spray Commonly known as:  ATROVENT Place 2 sprays into both nostrils 4 (four) times daily.   lisinopril 20 MG tablet Commonly known as:  PRINIVIL,ZESTRIL Take 20 mg by mouth 2 (two) times daily.   metFORMIN 500 MG (MOD) 24 hr tablet Commonly known as:  GLUMETZA Take 500 mg by mouth daily with breakfast.   montelukast 10 MG tablet Commonly known as:  SINGULAIR Take 10 mg by mouth at bedtime.   ramipril 10 MG capsule Commonly known as:  ALTACE Take 10 mg by mouth daily.   ranitidine 150 MG tablet Commonly known as:  ZANTAC Take 150 mg by mouth daily.   ranitidine 150 MG tablet Commonly known as:  ZANTAC Take 150 mg by mouth 2 (two) times daily.   sertraline 50 MG tablet Commonly known as:  ZOLOFT Take 50 mg by mouth daily.       Past Surgical History:  She  has a past surgical history that includes Mastectomy (Right) and Finger surgery (Right).  Family History:  Her family history is not on file.  Social History:  She  reports that she quit smoking about 34 years ago. Her smoking use included cigarettes. She has a 1.20 pack-year smoking history. She has never used smokeless tobacco. She reports that she drinks alcohol. She reports that she does not use drugs.

## 2018-10-10 NOTE — Patient Instructions (Signed)
Will arrange for CPAP set up  Follow up in 2 months after getting CPAP set up

## 2018-12-11 ENCOUNTER — Emergency Department (HOSPITAL_COMMUNITY)
Admission: EM | Admit: 2018-12-11 | Discharge: 2018-12-11 | Disposition: A | Discharge status: Home / Self Care | Payer: 59 | Attending: Emergency Medicine | Admitting: Emergency Medicine

## 2018-12-11 ENCOUNTER — Emergency Department (HOSPITAL_COMMUNITY): Payer: 59

## 2018-12-11 DIAGNOSIS — E119 Type 2 diabetes mellitus without complications: Secondary | ICD-10-CM | POA: Diagnosis not present

## 2018-12-11 DIAGNOSIS — Z87891 Personal history of nicotine dependence: Secondary | ICD-10-CM | POA: Diagnosis not present

## 2018-12-11 DIAGNOSIS — Z853 Personal history of malignant neoplasm of breast: Secondary | ICD-10-CM | POA: Insufficient documentation

## 2018-12-11 DIAGNOSIS — Z7901 Long term (current) use of anticoagulants: Secondary | ICD-10-CM | POA: Diagnosis not present

## 2018-12-11 DIAGNOSIS — I480 Paroxysmal atrial fibrillation: Secondary | ICD-10-CM | POA: Diagnosis not present

## 2018-12-11 DIAGNOSIS — I4891 Unspecified atrial fibrillation: Secondary | ICD-10-CM | POA: Diagnosis present

## 2018-12-11 DIAGNOSIS — J45909 Unspecified asthma, uncomplicated: Secondary | ICD-10-CM | POA: Diagnosis not present

## 2018-12-11 DIAGNOSIS — D649 Anemia, unspecified: Secondary | ICD-10-CM

## 2018-12-11 DIAGNOSIS — Z79899 Other long term (current) drug therapy: Secondary | ICD-10-CM | POA: Insufficient documentation

## 2018-12-11 LAB — POC OCCULT BLOOD, ED: Fecal Occult Bld: NEGATIVE

## 2018-12-11 LAB — CBC
HCT: 35.9 % — ABNORMAL LOW (ref 36.0–46.0)
Hemoglobin: 10.9 g/dL — ABNORMAL LOW (ref 12.0–15.0)
MCH: 27.5 pg (ref 26.0–34.0)
MCHC: 30.4 g/dL (ref 30.0–36.0)
MCV: 90.7 fL (ref 80.0–100.0)
Platelets: 291 10*3/uL (ref 150–400)
RBC: 3.96 MIL/uL (ref 3.87–5.11)
RDW: 14.6 % (ref 11.5–15.5)
WBC: 13 10*3/uL — ABNORMAL HIGH (ref 4.0–10.5)
nRBC: 0 % (ref 0.0–0.2)

## 2018-12-11 LAB — BASIC METABOLIC PANEL
Anion gap: 10 (ref 5–15)
BUN: 15 mg/dL (ref 6–20)
CO2: 28 mmol/L (ref 22–32)
Calcium: 8.5 mg/dL — ABNORMAL LOW (ref 8.9–10.3)
Chloride: 102 mmol/L (ref 98–111)
Creatinine, Ser: 0.66 mg/dL (ref 0.44–1.00)
GFR calc Af Amer: >60 mL/min (ref >60)
GFR calc non Af Amer: >60 mL/min (ref >60)
Glucose, Bld: 149 mg/dL — ABNORMAL HIGH (ref 70–99)
Potassium: 3.8 mmol/L (ref 3.5–5.1)
Sodium: 140 mmol/L (ref 135–145)

## 2018-12-11 LAB — TSH: TSH: 1.978 u[IU]/mL (ref 0.350–4.500)

## 2018-12-11 LAB — TROPONIN I: Troponin I: <0.03 ng/mL (ref <0.03)

## 2018-12-11 MED ORDER — FERROUS SULFATE 325 (65 FE) MG PO TABS: 325.0000 mg | ORAL_TABLET | Freq: Every day | ORAL | 0 refills | Status: DC

## 2018-12-11 NOTE — ED Provider Notes (Signed)
[facility] DEPT Provider Note   CSN: [id number] Arrival date & time: 12/11/18  1456     History   Chief Complaint Chief Complaint  Patient presents with  . Anxiety    HPI [name] is a 57 y.o. female.  Pt presents to the ED today with afib.  Pt said she has a hx of afib and is on Flecanide/Eliquis.  The pt has not had an episode of afib for a few years.  She has been under a lot of stress and anxiety.  Her mom and aunt died in a car wreck 2 months ago and she's the executor of the will.  The holiday season has been tough.  She has not been sleeping well.  She woke up at 0400 crying.  She does not think the afib lasted long.  By the time she called EMS, she was back in NSR.  The pt denies cp or sob.     Past Medical History:  Diagnosis Date  . Asthma   . Atrial fibrillation ([place]) 06/2014  . Breast cancer ([place])   . GE reflux   . HTN (hypertension)   . Type II or unspecified type diabetes mellitus without mention of complication, not stated as uncontrolled     Patient Active Problem List   Diagnosis Date Noted  . Hypertension 10/05/2011  . Dependent edema 10/05/2011  . History of breast cancer 10/05/2011  . Obesity 10/05/2011    Past Surgical History:  Procedure Laterality Date  . FINGER SURGERY Right   . MASTECTOMY Right      OB History   No obstetric history on file.      Home Medications    Prior to Admission medications   Medication Sig Start Date End Date Taking? Authorizing Provider  apixaban (ELIQUIS) 5 MG TABS tablet Take 5 mg by mouth 2 (two) times daily.   Yes [provider]  buPROPion (WELLBUTRIN XL) 300 MG 24 hr tablet Take 300 mg by mouth daily.   Yes [provider]  cetirizine (ZYRTEC) 10 MG tablet Take 10 mg by mouth at bedtime.    Yes [provider]  cholecalciferol (VITAMIN D3) 25 MCG (1000 UT) tablet Take 1,000 Units by mouth daily.   Yes [provider]    DULoxetine (CYMBALTA) 20 MG capsule Take 20 mg by mouth daily.   Yes [provider]  flecainide (TAMBOCOR) 50 MG tablet Take 50 mg by mouth 2 (two) times daily.   Yes [provider]  fluticasone (FLONASE) 50 MCG/ACT nasal spray Place 1 spray into both nostrils daily.   Yes [provider]  furosemide (LASIX) 40 MG tablet Take 40 mg by mouth.   Yes [provider]  lisinopril (PRINIVIL,ZESTRIL) 40 MG tablet Take 40 mg by mouth daily.   Yes [provider]  metFORMIN (GLUMETZA) 500 MG (MOD) 24 hr tablet Take 1,000 mg by mouth 2 (two) times daily with a meal.    Yes [provider]  montelukast (SINGULAIR) 10 MG tablet Take 10 mg by mouth at bedtime.   Yes [provider]  omeprazole (PRILOSEC) 20 MG capsule Take 20 mg by mouth 2 (two) times daily before a meal.   Yes [provider]  albuterol (PROVENTIL HFA;VENTOLIN HFA) 108 (90 Base) MCG/ACT inhaler Inhale 2 puffs into the lungs every 6 (six) hours as needed for wheezing or shortness of breath. 04/10/17   Barry Dienes, NP  benzonatate (TESSALON) 100 MG capsule  Take 1-2 capsules (100-200 mg total) by mouth 3 (three) times daily as needed for cough. 08/04/17   Robyn Haber, MD  ferrous sulfate 325 (65 FE) MG tablet Take 1 tablet (325 mg total) by mouth daily. 12/11/18   Isla Pence, MD  HYDROcodone-homatropine [facility]) 5-1.5 MG/5ML syrup Take 5 mLs by mouth every 6 (six) hours as needed for cough. Patient not taking: Reported on 12/11/2018 08/14/17   Vanessa Kick, MD  ipratropium (ATROVENT) 0.06 % nasal spray Place 2 sprays into both nostrils 4 (four) times daily. Patient not taking: Reported on 12/11/2018 04/16/17   Lysbeth Penner, FNP    Family History No family history on file.  Social History Social History   Tobacco Use  . Smoking status: Former Smoker    Packs/day: 0.30    Years: 4.00    Pack years: 1.20    Types: Cigarettes    Last attempt to quit:  12/22/1983    Years since quitting: 34.9  . Smokeless tobacco: Never Used  Substance Use Topics  . Alcohol use: Yes  . Drug use: No     Allergies   Erythromycin   Review of Systems Review of Systems  Cardiovascular: Positive for palpitations.  All other systems reviewed and are negative.    Physical Exam Updated Vital Signs BP 124/62 (BP Location: Left Arm)   Pulse 95   Temp 98.8 F (37.1 C) (Oral)   Resp (!) 22   Ht 5\' 5"  (1.651 m)   Wt (!) 172.4 kg   SpO2 97%   BMI 63.24 kg/m   Physical Exam Vitals signs and nursing note reviewed.  Constitutional:      Appearance: Normal appearance. She is obese.  HENT:     Head: Normocephalic and atraumatic.     Right Ear: External ear normal.     Left Ear: External ear normal.     Nose: Nose normal.     Mouth/Throat:     Mouth: Mucous membranes are moist.     Pharynx: Oropharynx is clear.  Eyes:     Extraocular Movements: Extraocular movements intact.     Conjunctiva/sclera: Conjunctivae normal.     Pupils: Pupils are equal, round, and reactive to light.  Neck:     Musculoskeletal: Normal range of motion and neck supple.  Cardiovascular:     Rate and Rhythm: Normal rate and regular rhythm.     Pulses: Normal pulses.     Heart sounds: Normal heart sounds.  Pulmonary:     Effort: Pulmonary effort is normal.     Breath sounds: Normal breath sounds.  Abdominal:     General: Abdomen is flat. Bowel sounds are normal.     Palpations: Abdomen is soft.  Genitourinary:    Rectum: Guaiac result negative.  Musculoskeletal: Normal range of motion.  Skin:    General: Skin is warm.     Capillary Refill: Capillary refill takes less than 2 seconds.  Neurological:     General: No focal deficit present.     Mental Status: She is alert and oriented to person, place, and time.  Psychiatric:        Mood and Affect: Mood normal.        Behavior: Behavior normal.        Thought Content: Thought content normal.        Judgment:  Judgment normal.      ED Treatments / Results  Labs (all labs ordered are listed, but only abnormal results are displayed)  Labs Reviewed  BASIC METABOLIC PANEL - Abnormal; Notable for the following components:      Result Value   Glucose, Bld 149 (*)    Calcium 8.5 (*)    All other components within normal limits  CBC - Abnormal; Notable for the following components:   WBC 13.0 (*)    Hemoglobin 10.9 (*)    HCT 35.9 (*)    All other components within normal limits  TROPONIN I  TSH  POC OCCULT BLOOD, ED   CBC W/ Diff (09/15/2018 9:19 AM EDT) CBC W/ Diff (09/15/2018 9:19 AM EDT)  Component Value Ref Range Performed At Pathologist Signature  WBC COUNT 10.93 (H) 3.50 - 10.50 10E9/L [facility] [facility]   RBC Count 4.69 3.90 - 5.03 10E12/L [facility]   HGB 12.8 12.0 - 15.5 g/dL [facility]   Hematocrit 42.2 35.0 - 45.0 % [facility] [place] [facility]   MCV 90.0 82.0 - 98.0 fL [facility]   MCH 27.3 26.0 - 34.0 pg [facility]   MCHC 30.3 (L) 32.0 - 36.0 g/dL [facility]   RDW 15.1 11.9 - 15.5 % [facility]   PLATELET 368        EKG EKG Interpretation  Date/Time:  "Sunday December 11 2018 15:10:42 EST Ventricular Rate:  94 PR Interval:    QRS Duration: 98 QT Interval:  354 QTC Calculation: 443 R Axis:   80 Text Interpretation:  Sinus rhythm Baseline wander in lead(s) II III aVF Confirmed by Himmat Enberg ([id number]) on 12/11/2018 4:08:57 PM Also confirmed by Antoni Stefan ([id number]), editor Cassel, Kerry ([id number])  on 12/11/2018 4:22:48 PM   Radiology Dg Chest 2 View  Result Date: 12/11/2018 CLINICAL DATA:  Tachycardia. Atrial fibrillation. Asthma. Personal history of right breast carcinoma. EXAM: CHEST - 2 VIEW COMPARISON:  05/11/2018 FINDINGS: Stable mild cardiomegaly. Pulmonary hyperinflation is again seen, consistent with COPD. No evidence of pulmonary infiltrate or  edema. No evidence of pleural effusion. Previous right mastectomy and axillary lymph node dissection again noted. IMPRESSION: Stable cardiomegaly and COPD.  No active disease. Electronically Signed   By: John  Stahl M.D.   On: 12/11/2018 16:38    Procedures Procedures (including critical care time)  Medications Ordered in ED Medications - No data to display   Initial Impression / Assessment and Plan / ED Course  I have reviewed the triage vital signs and the nursing notes.  Pertinent labs & imaging results that were available during my care of the patient were reviewed by me and considered in my medical decision making (see chart for details).    Pt's hemoglobin is 10.9 today.  It was 12.8 in September.  The pt's stool was negative.  She denies any black or bloody stools.  She has never been told she's anemic.  She is told that she needs to get this rechecked by her doctor.  The pt has been in NSR since she's been here.  I would not change any of her meds now.  She is told to call her cardiologist and let them know.  She knows to return if sx worsen.  Final Clinical Impressions(s) / ED Diagnoses   Final diagnoses:  Paroxysmal atrial fibrillation (HCC)  Anemia, unspecified type    ED Discharge Orders         Ordered    ferrous sulfate 325 (65 FE) MG tablet  Daily     12" /22/19 1745  Isla Pence, MD 12/11/18 [phone]

## 2018-12-11 NOTE — Discharge Instructions (Addendum)
Your hemoglobin is 10.9 today.  Your last hemoglobin that was ordered by your doctor was 12.8 back in September.  Your doctor will need to recheck this number.  If you have any black or bloody stools, return to the ED.

## 2018-12-11 NOTE — ED Triage Notes (Signed)
Pt BIB [facility] from home. Pt c/o palpitations that started earlier today. Pt is NSR on monitor. Pt has hx of a-fib on eliquis for such. Pt is normally NSR but will occasionally be in a-fib. Pt has been stressed lately as well. Denies CP. CAOx4.

## 2018-12-11 NOTE — ED Notes (Signed)
Bed: IX65 Expected date:  Expected time:  Means of arrival:  Comments: ? A fib, SR noted by EMS

## 2019-01-10 ENCOUNTER — Encounter: Payer: Self-pay | Admitting: Pulmonary Disease

## 2019-01-10 ENCOUNTER — Ambulatory Visit: Payer: 59 | Admitting: Pulmonary Disease

## 2019-01-10 VITALS — BP 124/80 | HR 94 | Ht 65.0 in | Wt 393.6 lb

## 2019-01-10 DIAGNOSIS — E669 Obesity, unspecified: Secondary | ICD-10-CM | POA: Diagnosis not present

## 2019-01-10 DIAGNOSIS — Z9989 Dependence on other enabling machines and devices: Secondary | ICD-10-CM

## 2019-01-10 DIAGNOSIS — G4730 Sleep apnea, unspecified: Secondary | ICD-10-CM

## 2019-01-10 DIAGNOSIS — G4733 Obstructive sleep apnea (adult) (pediatric): Secondary | ICD-10-CM | POA: Diagnosis not present

## 2019-01-10 NOTE — Patient Instructions (Signed)
Will change your auto CPAP to 10 - 18 cm H2O  Follow up in 1 year

## 2019-01-10 NOTE — Progress Notes (Signed)
[facility]  Chief Complaint  Patient presents with  . Follow-up    pt is doing well overall with cpap machine.     Constitutional:  BP 124/80 (BP Location: Left Arm, Cuff Size: Normal)   Pulse 94   Ht 5\' 5"  (1.651 m)   Wt (!) 393 lb 9.6 oz (178.5 kg)   SpO2 95%   BMI 65.50 kg/m   Past Medical History:  Asthma, A fib, Breast Cancer, GerD, HTN, DM  Brief Summary:  [name] is a 58 y.o. female with obstructive sleep apnea.  She is sleeping much better since starting CPAP.  She feels more energy during the day.  No issue with mask fit.  Not having sinus congestion, sore throat, dry mouth, or aerophagia.  Feels the ramping setting is too low and lasts too long.  She is still feeling depressed, but not as bad as before.  She read that singulair can cause depression.     Physical Exam:   Appearance - well kempt   ENMT - no sinus tenderness, no nasal discharge, no oral exudate  Neck - no masses, trachea midline, no thyromegaly, no elevation in JVP  Respiratory - normal appearance of chest wall, normal respiratory effort w/o accessory muscle use, no wheezing or rales  CV - s1s2 regular rate and rhythm, no murmurs, no peripheral edema, no varicosities, radial pulses symmetric  GI - soft, non tender  Lymph - no adenopathy noted in neck and axillary areas  MSK - normal gait  Ext - no cyanosis, clubbing, or joint inflammation noted  Skin - no rashes, lesions, or ulcers  Neuro - normal strength, oriented x 3  Psych - normal mood and affect   Assessment/Plan:   Obstructive sleep apnea. - she is compliant with CPAP and reports benefit - will change her auto CPAP to 10 - 18 cm H2O - discussed how to change her ramping set up  Obesity. - discussed the importance of weight loss  Hx of depression and anxiety. - advised her to d/w her PCP about whether she could try coming off of singulair - I explained in my  experience, depression is not a common side effect from use of singulair  Patient Instructions  Will change your auto CPAP to 10 - 18 cm H2O  Follow up in 1 year    Chesley Mires, MD [place] Pager: [phone] 01/10/2019, 11:05 AM  Flow Sheet     Pulmonary tests:  Spirometry 09/06/18 >> FEV1 2.20 (82%), FEV1% 79  Sleep tests:  PSG 09/30/18 >> AHI 76, SpO2 low 72% Auto CPAP 12/10/18 to 01/08/19 >> used on 30 of 30 nights with average 7 hrs 50 min.  Average AHI 0.7 with median CPAP 14 and 95 th percentile CPAP 15 cm H2O  Medications:   Allergies as of 01/10/2019      Reactions   Erythromycin Diarrhea      Medication List       Accurate as of January 10, 2019 11:05 AM. Always use your most recent med list.        albuterol 108 (90 Base) MCG/ACT inhaler Commonly known as:  PROVENTIL HFA;VENTOLIN HFA Inhale 2 puffs into the lungs every 6 (six) hours as needed for wheezing or shortness of breath.   buPROPion 300 MG 24 hr tablet Commonly known as:  WELLBUTRIN XL Take 300 mg by mouth daily.   cetirizine 10 MG tablet Commonly known as:  ZYRTEC  Take 10 mg by mouth at bedtime.   cholecalciferol 25 MCG (1000 UT) tablet Commonly known as:  VITAMIN D3 Take 1,000 Units by mouth daily.   DULoxetine 20 MG capsule Commonly known as:  CYMBALTA Take 20 mg by mouth daily.   ELIQUIS 5 MG Tabs tablet Generic drug:  apixaban Take 5 mg by mouth 2 (two) times daily.   ferrous sulfate 325 (65 FE) MG tablet Take 1 tablet (325 mg total) by mouth daily.   flecainide 50 MG tablet Commonly known as:  TAMBOCOR Take 50 mg by mouth 2 (two) times daily.   fluticasone 50 MCG/ACT nasal spray Commonly known as:  FLONASE Place 1 spray into both nostrils daily.   furosemide 40 MG tablet Commonly known as:  LASIX Take 40 mg by mouth.   lisinopril 40 MG tablet Commonly known as:  PRINIVIL,ZESTRIL Take 40 mg by mouth daily.   metFORMIN 500 MG (MOD) 24 hr  tablet Commonly known as:  GLUMETZA Take 1,000 mg by mouth 2 (two) times daily with a meal.   montelukast 10 MG tablet Commonly known as:  SINGULAIR Take 10 mg by mouth at bedtime.   omeprazole 20 MG capsule Commonly known as:  PRILOSEC Take 20 mg by mouth 2 (two) times daily before a meal.       Past Surgical History:  She  has a past surgical history that includes Mastectomy (Right) and Finger surgery (Right).  Family History:  Her family history is not on file.  Social History:  She  reports that she quit smoking about 35 years ago. Her smoking use included cigarettes. She has a 1.20 pack-year smoking history. She has never used smokeless tobacco. She reports current alcohol use. She reports that she does not use drugs.

## 2019-03-09 ENCOUNTER — Telehealth: Payer: Self-pay | Admitting: *Deleted

## 2019-03-09 NOTE — Telephone Encounter (Signed)
Faxed referral placed in scheduling box. [place] [facility] [place], MD P# [phone], F# [phone]

## 2019-04-18 NOTE — Progress Notes (Signed)
New Outpatient Visit Date: 04/21/2019  Referring Provider: Prudencio Pair, MD [address] [place] [address]  Chief Complaint: Establish care  HPI:  [name] is a 58 y.o. female who is being seen today for the evaluation of HFpEF and paroxysmal atrial fibrillation at the request of Dr. Pincus Large. She has a history of chronic diastolic heart failure, paroxysmal atrial fibrillation, obstructive sleep apnea, hypertension, and type 2 diabetes mellitus.  She recently moved from [place] to [place] and is in the process of establishing new medical providers in this area.  [name] was diagnosed with atrial fibrillation in 06/2014.  She developed palpitations and was subsequently admitted to the hospital.  She spontaneously converted to sinus rhythm and has not had any further documented episodes of atrial fibrillation.  She notes occasional palpitations, as recently as 11/2018.  She presented to the [facility] emergency department and was found to be in sinus rhythm.  However, iron deficiency was discovered.  She has attributed her intermittent palpitations to this and feels like they have improved with iron supplementation.  [name] denies chest pain, shortness of breath, lightheadedness, and orthopnea.  She has chronic lower extremity edema that is managed with compression stockings and furosemide.  She also uses CPAP at night.  She has been under considerable stress since 09/2018, when her mother was killed in a motor vehicle crash.  She continues to feel very upset about this and is still involved in the legal proceedings against the adolescent driver who killed her mother.  [name] denies any significant bleeding.  She reports that her PCP collected fecal studies that were found to be negative for occult blood.  She remains on apixaban and flecainide.  She does not exercise regularly.  She had lost 120 pounds around 2012 but subsequently regained all of this plus additional  weight over the next few years.  --------------------------------------------------------------------------------------------------  Cardiovascular History & Procedures: Cardiovascular Problems:  Paroxysmal atrial fibrillation  Chronic diastolic heart failure  Risk Factors:  Hypertension, diabetes mellitus, and morbid obesity  Cath/PCI:  LHC (02/16/2018): Mild luminal irregularities involving the LAD, LCx, and RCA.  LVEDP 34 mmHg.  CV Surgery:  None  EP Procedures and Devices:  None  Non-Invasive Evaluation(s):  Myocardial PET/CT (01/07/2018): Small in size, moderate in severity, reversible anterior defect that may represent ischemia.  Small to moderate in size, moderate in severity, apical lateral and apical defect that is partially reversible and may represent ischemia.  LVEF 61%.  TTE (11/09/2017): Normal LV size and function.  LVEF 60-65% with normal diastolic function.  Normal RV size and function.  Mitral annular calcification present.  Recent CV Pertinent Labs: Lab Results  Component Value Date   K 3.8 12/11/2018   BUN 15 12/11/2018   CREATININE 0.66 12/11/2018    --------------------------------------------------------------------------------------------------  Past Medical History:  Diagnosis Date   Asthma    Atrial fibrillation ([place]) 06/2014   Breast cancer ([place])    GE reflux    HTN (hypertension)    Type II or unspecified type diabetes mellitus without mention of complication, not stated as uncontrolled     Past Surgical History:  Procedure Laterality Date   CARDIAC CATHETERIZATION  02/16/2018   No stents;Dr. Pincus Large [place], [place] [facility]   FINGER SURGERY Right    MASTECTOMY Right     Current Meds  Medication Sig   albuterol (PROVENTIL HFA;VENTOLIN HFA) 108 (90 Base) MCG/ACT inhaler Inhale 2 puffs into the lungs every  6 (six) hours as needed for wheezing or shortness of breath.   apixaban (ELIQUIS) 5 MG TABS tablet Take 5  mg by mouth 2 (two) times daily.   buPROPion (WELLBUTRIN XL) 300 MG 24 hr tablet Take 300 mg by mouth daily.   cetirizine (ZYRTEC) 10 MG tablet Take 10 mg by mouth at bedtime.    cholecalciferol (VITAMIN D3) 25 MCG (1000 UT) tablet Take 1,000 Units by mouth daily.   DULoxetine (CYMBALTA) 20 MG capsule Take 20 mg by mouth daily.   ferrous sulfate 325 (65 FE) MG tablet Take 1 tablet (325 mg total) by mouth daily.   flecainide (TAMBOCOR) 50 MG tablet Take 50 mg by mouth 2 (two) times daily.   fluticasone (FLONASE) 50 MCG/ACT nasal spray Place 1 spray into both nostrils daily.   furosemide (LASIX) 40 MG tablet Take 40 mg by mouth.   lisinopril (PRINIVIL,ZESTRIL) 40 MG tablet Take 40 mg by mouth daily.   metFORMIN (GLUMETZA) 500 MG (MOD) 24 hr tablet Take 1,000 mg by mouth 2 (two) times daily with a meal.    montelukast (SINGULAIR) 10 MG tablet Take 10 mg by mouth at bedtime.   omeprazole (PRILOSEC) 20 MG capsule Take 20 mg by mouth 2 (two) times daily before a meal.    Allergies: Erythromycin  Social History   Tobacco Use   Smoking status: Former Smoker    Packs/day: 0.30    Years: 4.00    Pack years: 1.20    Types: Cigarettes    Last attempt to quit: 12/22/1983    Years since quitting: 35.3   Smokeless tobacco: Never Used  Substance Use Topics   Alcohol use: Not Currently   Drug use: No    Family History  Problem Relation Age of Onset   Heart Problems Mother    Hypertension Mother    Angina Mother    Atrial fibrillation Mother    Heart Problems Father    Hypertension Father     Review of Systems: Review of Systems  Constitutional: Negative.   HENT: Negative.   Eyes: Negative.   Respiratory: Negative.   Cardiovascular: Positive for palpitations and leg swelling. Negative for chest pain, orthopnea, claudication and PND.  Gastrointestinal: Negative.   Genitourinary: Negative.   Musculoskeletal: Negative.   Skin: Positive for rash (Stasis dermatitis,  treated with compression stockings).  Neurological: Negative.   Endo/Heme/Allergies: Positive for environmental allergies.  Psychiatric/Behavioral: Positive for depression (Stressed following mother's death in car accident in 2018/11/02).   --------------------------------------------------------------------------------------------------  Physical Exam: BP 138/76 (BP Location: Left Arm, Patient Position: Sitting, Cuff Size: Large)    Pulse 96    Ht 5\' 5"  (1.651 m)    Wt (!) 396 lb (179.6 kg)    BMI 65.90 kg/m   General: Morbidly obese woman, seated comfortably in the exam room. HEENT: No conjunctival pallor or scleral icterus. Moist mucous membranes. OP clear. Neck: Supple without lymphadenopathy, thyromegaly, JVD, or HJR, though evaluation is limited by body habitus.. No carotid bruit. Lungs: Normal work of breathing. Clear to auscultation bilaterally without wheezes or crackles. Heart: Regular rate and rhythm without murmurs, rubs, or gallops.  Unable to assess PMI due to body habitus. Abd: Bowel sounds present. Soft, NT/ND.  Unable to assess HSM due to body habitus. Ext: No lower extremity edema with compression stockings in place. Radial, PT, and DP pulses are 2+ bilaterally Skin: Warm and dry without rash. Neuro: CNIII-XII intact. Strength and fine-touch sensation intact in upper and lower extremities bilaterally.  Psych: Patient tearful during much of the exam, exacerbated when speaking about her mother  EKG: Normal sinus rhythm without abnormality.  Lab Results  Component Value Date   WBC 13.0 (H) 12/11/2018   HGB 10.9 (L) 12/11/2018   HCT 35.9 (L) 12/11/2018   MCV 90.7 12/11/2018   PLT 291 12/11/2018    Lab Results  Component Value Date   NA 140 12/11/2018   K 3.8 12/11/2018   CL 102 12/11/2018   CO2 28 12/11/2018   BUN 15 12/11/2018   CREATININE 0.66 12/11/2018   GLUCOSE 149 (H) 12/11/2018   ALT 14 06/04/2009    No results found for: CHOL, HDL, LDLCALC, LDLDIRECT,  TRIG, CHOLHDL   --------------------------------------------------------------------------------------------------  ASSESSMENT AND PLAN: Paroxysmal atrial fibrillation: [name] notes occasional palpitations, though she is only had a single documented episode of atrial fibrillation.  She seems to be tolerating apixaban and flecainide well.  Catheterization last year did not show any CAD.  I think is reasonable to continue her current medications.  Chronic HFpEF: Patient reports stable symptoms.  Volume assessment is challenging due to the chronic edema likely driven by concurrent edema and venous insufficiency as well as morbid obesity.  I would not make any medication changes today.  As her current compression stockings are becoming worn out, we will provide her with a new prescription for knee-high compression stockings, 20 to 30 mmHg compression.  Hypertension: Blood pressure upper normal today.  Continue lisinopril 40 mg daily.  Type 2 diabetes mellitus: Continue metformin and follow-up with Dr. Darron Doom.  It does not appear that the patient is on a statin.  While catheterization did not show evidence of CAD, she may benefit from addition of moderate intensity statin therapy.  We will request labs from Dr. Dennie Fetters office.  I will defer further discussion regarding statin therapy to Dr. Darron Doom.  Morbid obesity: BMI greater than 40 with multiple comorbidities.  I have encouraged [name] to work on [employer] loss through diet and exercise, though ongoing emotional stress from her mother's death may make this challenging.  Follow-up: Return to clinic in 6 months.  Nelva Bush, MD 04/21/2019 2:07 PM

## 2019-04-21 ENCOUNTER — Other Ambulatory Visit: Payer: Self-pay

## 2019-04-21 ENCOUNTER — Ambulatory Visit (INDEPENDENT_AMBULATORY_CARE_PROVIDER_SITE_OTHER): Payer: BLUE CROSS/BLUE SHIELD | Admitting: Internal Medicine

## 2019-04-21 ENCOUNTER — Encounter: Payer: Self-pay | Admitting: Internal Medicine

## 2019-04-21 VITALS — BP 138/76 | HR 96 | Ht 65.0 in | Wt 396.0 lb

## 2019-04-21 DIAGNOSIS — I5032 Chronic diastolic (congestive) heart failure: Secondary | ICD-10-CM

## 2019-04-21 DIAGNOSIS — I480 Paroxysmal atrial fibrillation: Secondary | ICD-10-CM | POA: Diagnosis not present

## 2019-04-21 DIAGNOSIS — I10 Essential (primary) hypertension: Secondary | ICD-10-CM

## 2019-04-21 DIAGNOSIS — E119 Type 2 diabetes mellitus without complications: Secondary | ICD-10-CM | POA: Diagnosis not present

## 2019-04-21 HISTORY — DX: Paroxysmal atrial fibrillation: I48.0

## 2019-04-21 HISTORY — DX: Type 2 diabetes mellitus without complications: E11.9

## 2019-04-21 HISTORY — DX: Chronic diastolic (congestive) heart failure: I50.32

## 2019-04-21 NOTE — Patient Instructions (Signed)
Medication Instructions:  Your physician recommends that you continue on your current medications as directed. Please refer to the Current Medication list given to you today.  If you need a refill on your cardiac medications before your next appointment, please call your pharmacy.   Lab work: none If you have labs (blood work) drawn today and your tests are completely normal, you will receive your results only by: Marland Kitchen MyChart Message (if you have MyChart) OR . A paper copy in the mail If you have any lab test that is abnormal or we need to change your treatment, we will call you to review the results.  Testing/Procedures: none  Follow-Up: At [facility], you and your health needs are our priority.  As part of our continuing mission to provide you with exceptional heart care, we have created designated Provider Care Teams.  These Care Teams include your primary Cardiologist (physician) and Advanced Practice Providers (APPs -  Physician Assistants and Nurse Practitioners) who all work together to provide you with the care you need, when you need it. You will need a follow up appointment in 6 months.  Please call our office 2 months in advance to schedule this appointment.  You may see DR Harrell Gave END or one of the following Advanced Practice Providers on your designated Care Team:   Murray Hodgkins, NP Christell Faith, PA-C . Marrianne Mood, PA-C  Any Other Special Instructions Will Be Listed Below (If Applicable).  We will give you a prescription for compression stockings.

## 2019-07-06 ENCOUNTER — Other Ambulatory Visit: Payer: Self-pay | Admitting: Family Medicine

## 2019-07-06 DIAGNOSIS — Z1231 Encounter for screening mammogram for malignant neoplasm of breast: Secondary | ICD-10-CM

## 2019-08-03 ENCOUNTER — Telehealth: Payer: Self-pay | Admitting: Internal Medicine

## 2019-08-03 NOTE — Telephone Encounter (Signed)
Please advise if ok to refill Flecainide 50 mg bid. Pt requesting 90 day. Last historical provider.

## 2019-08-03 NOTE — Telephone Encounter (Signed)
°*  STAT* If patient is at the pharmacy, call can be transferred to refill team.   1. Which medications need to be refilled? (please list name of each medication and dose if known) flecainide 50 mg tablet bid  2. Which pharmacy/location (including street and city if local pharmacy) is medication to be sent to? [facility])  3. Do they need a 30 day or 90 day supply? [place]

## 2019-08-04 MED ORDER — FLECAINIDE ACETATE 50 MG PO TABS: 50.0000 mg | ORAL_TABLET | Freq: Two times a day (BID) | ORAL | 3 refills | Status: DC

## 2019-08-04 NOTE — Telephone Encounter (Signed)
Ok to refill x 1 year.  Nelva Bush, MD [facility] Pager: [phone]

## 2019-08-08 ENCOUNTER — Telehealth: Payer: Self-pay

## 2019-08-08 MED ORDER — FLECAINIDE ACETATE 50 MG PO TABS: 50.0000 mg | ORAL_TABLET | Freq: Two times a day (BID) | ORAL | 3 refills | Status: DC

## 2019-08-08 NOTE — Telephone Encounter (Signed)
Requested Prescriptions   Signed Prescriptions Disp Refills  . flecainide (TAMBOCOR) 50 MG tablet 180 tablet 3    Sig: Take 1 tablet (50 mg total) by mouth 2 (two) times daily.    Authorizing Provider: END, CHRISTOPHER    Ordering User: Raelene Bott, Doyal Saric L

## 2019-08-25 ENCOUNTER — Ambulatory Visit: Payer: BC Managed Care – PPO

## 2019-09-14 IMAGING — CR DG CHEST 2V
2 series · 2 of 2 positions shown · non-contrast
Comparison: 05/11/2018

CLINICAL DATA: Tachycardia. Atrial fibrillation. Asthma. Personal
history of right breast carcinoma.

EXAM:
CHEST - 2 VIEW

[w chest pa]
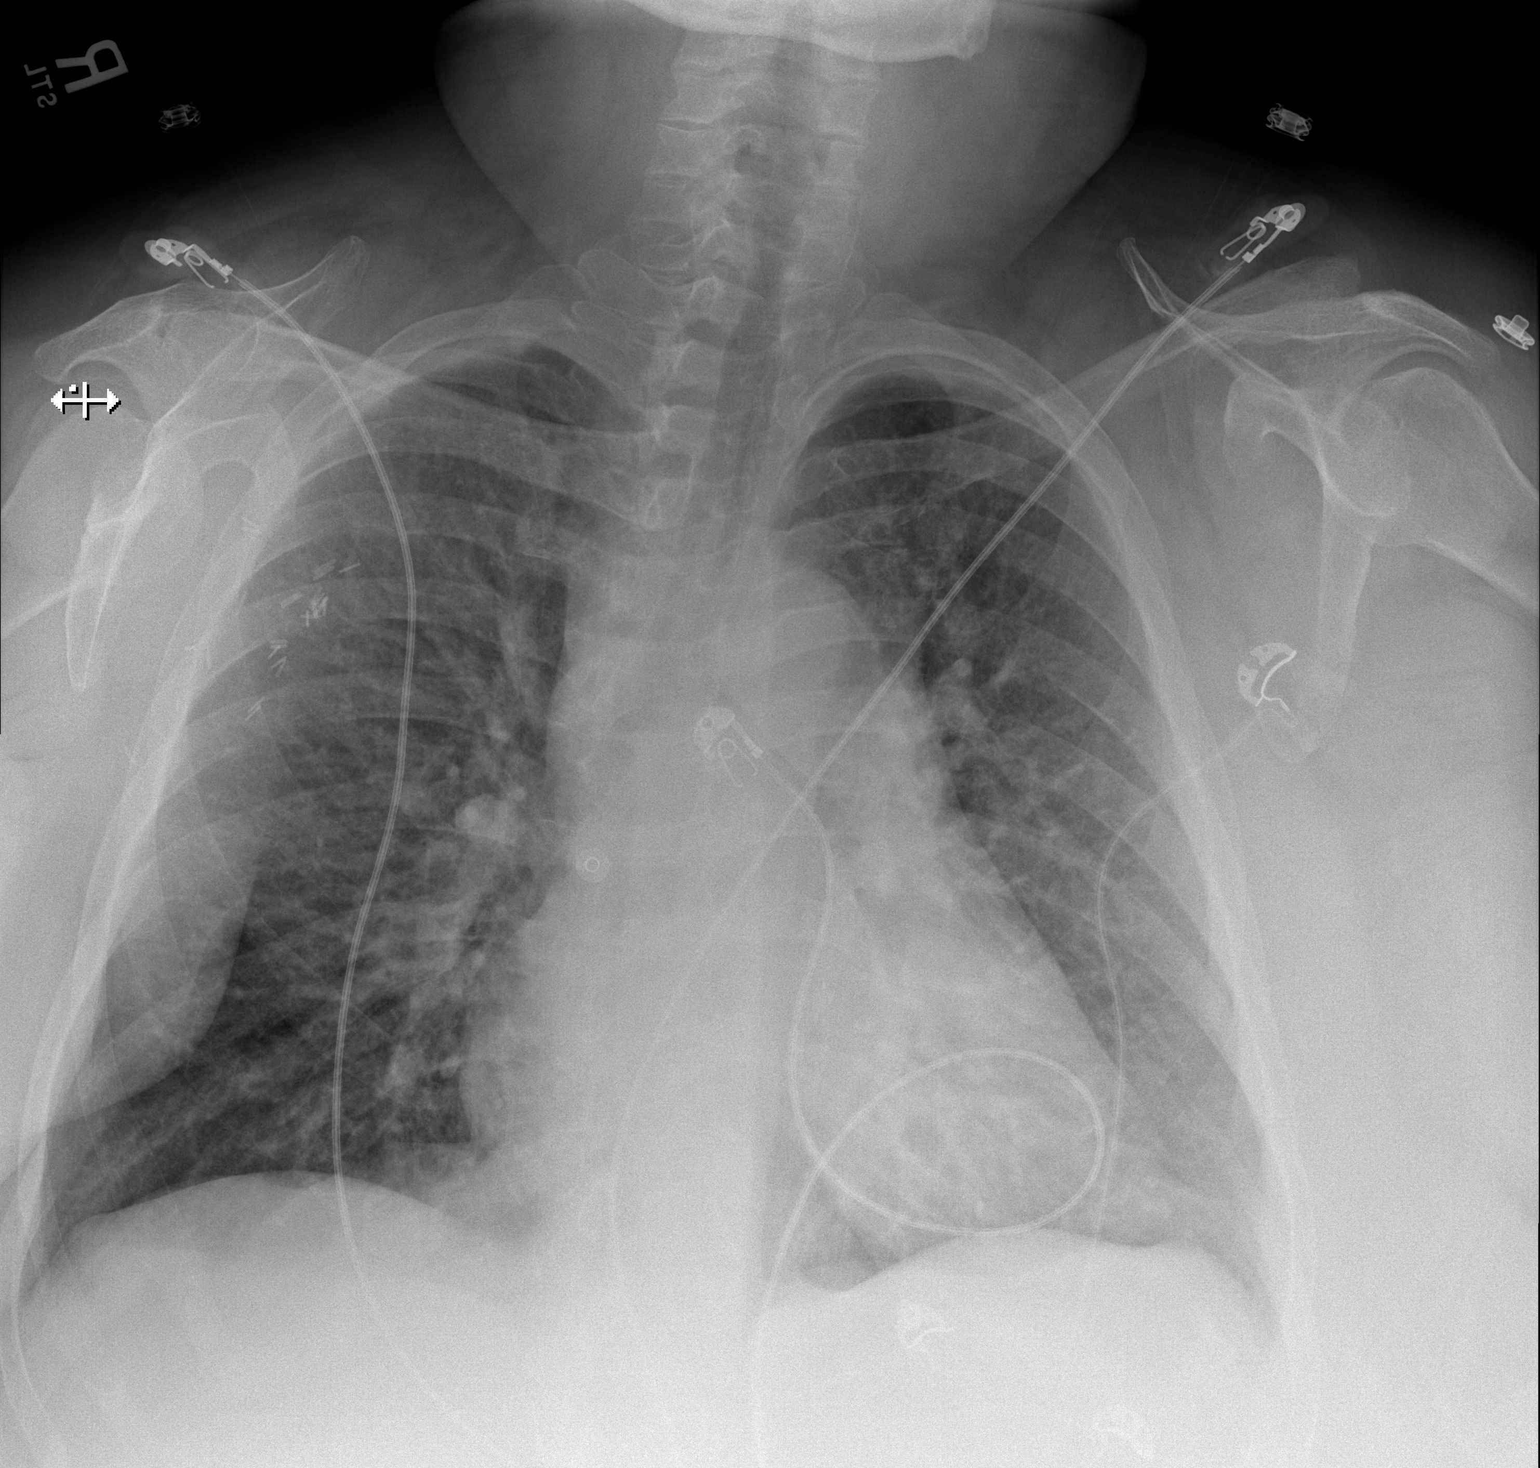

[w chest lat]
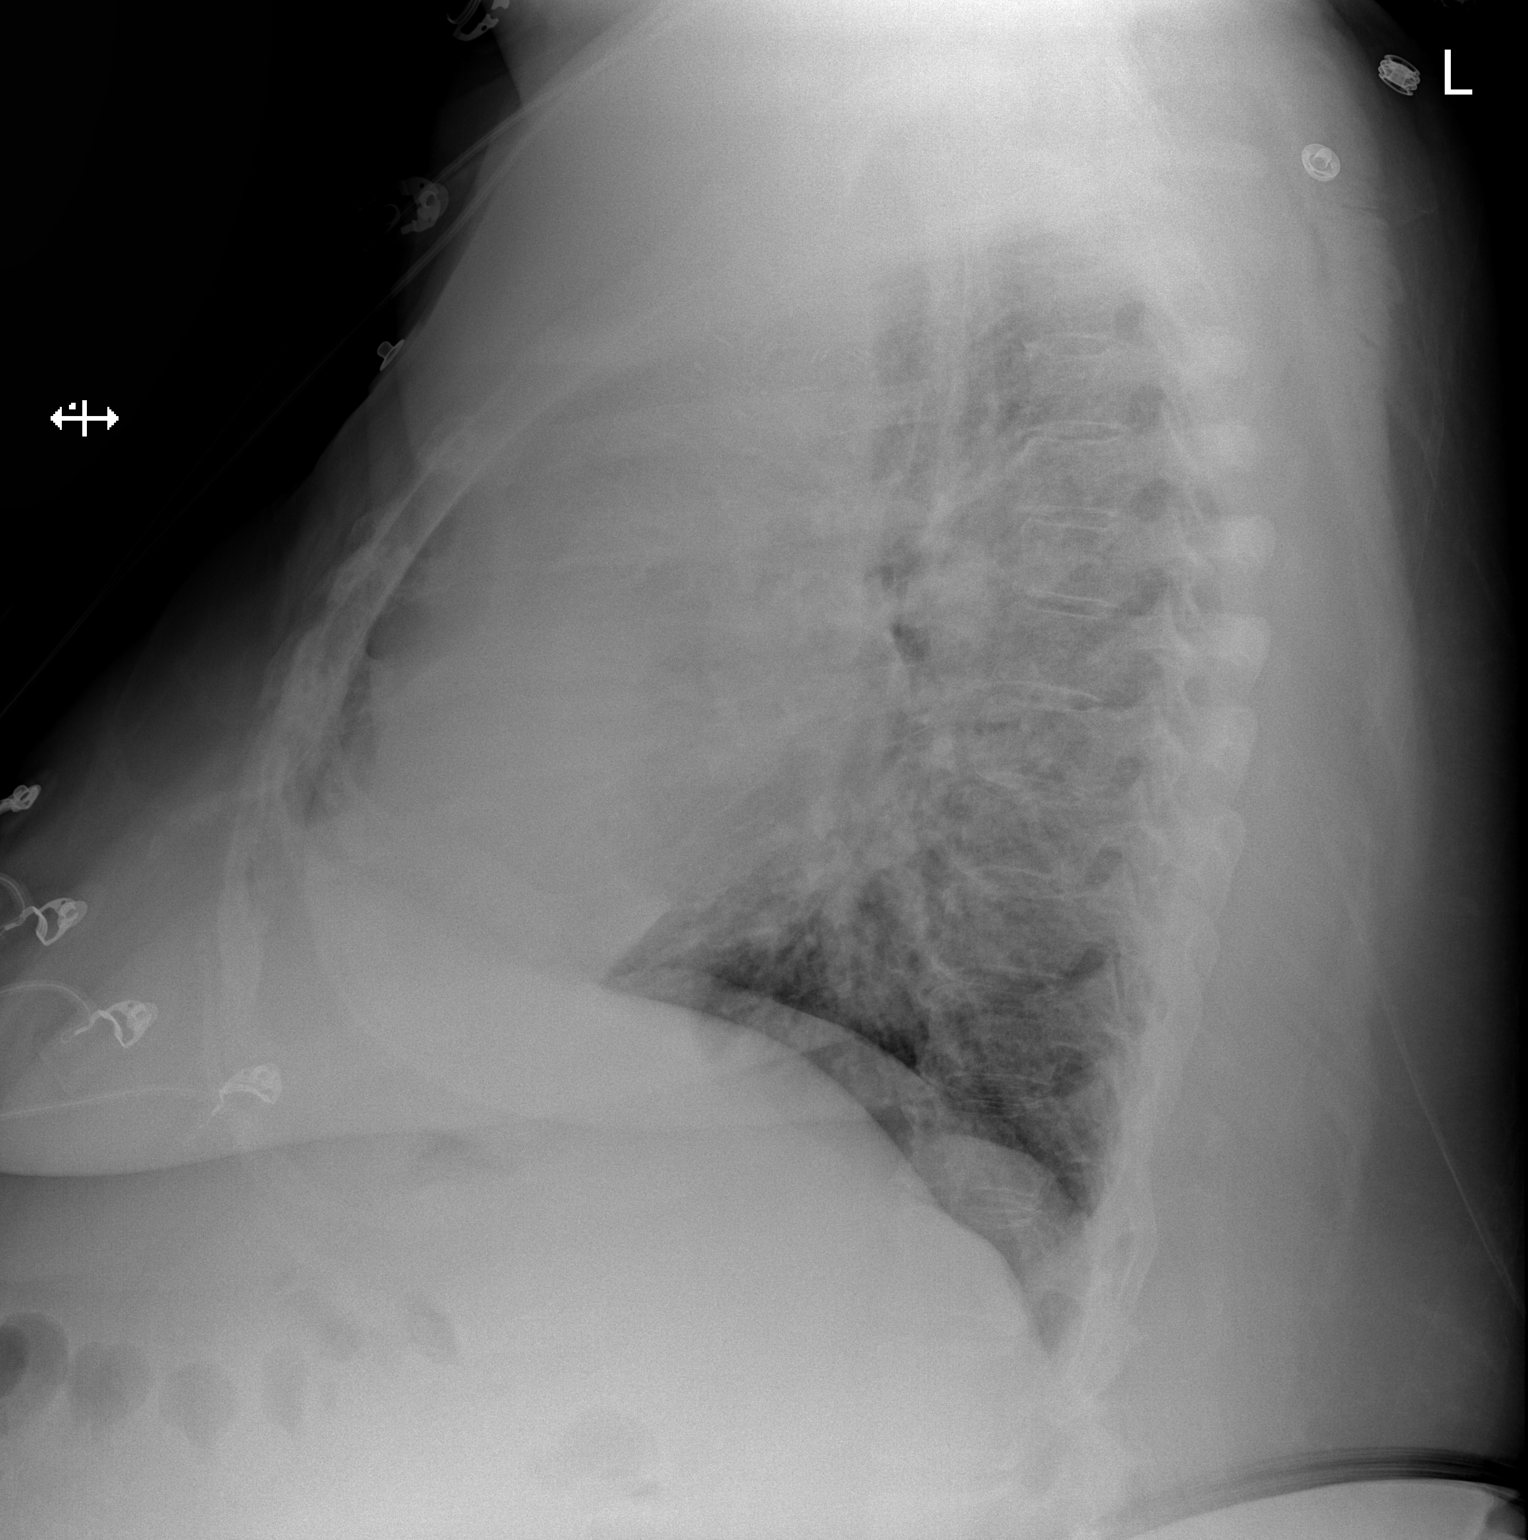

[2 of 2 positions shown; findings below may reference images not displayed]

FINDINGS: Stable mild cardiomegaly. Pulmonary hyperinflation is again seen,
consistent with COPD. No evidence of pulmonary infiltrate or edema.
No evidence of pleural effusion. Previous right mastectomy and
axillary lymph node dissection again noted.
IMPRESSION: Stable cardiomegaly and COPD.  No active disease.

## 2019-10-26 ENCOUNTER — Other Ambulatory Visit: Payer: Self-pay

## 2019-10-26 ENCOUNTER — Telehealth: Payer: Self-pay | Admitting: Internal Medicine

## 2019-10-26 ENCOUNTER — Telehealth (INDEPENDENT_AMBULATORY_CARE_PROVIDER_SITE_OTHER): Payer: BC Managed Care – PPO | Admitting: Internal Medicine

## 2019-10-26 VITALS — Ht 65.0 in

## 2019-10-26 DIAGNOSIS — I480 Paroxysmal atrial fibrillation: Secondary | ICD-10-CM

## 2019-10-26 DIAGNOSIS — I10 Essential (primary) hypertension: Secondary | ICD-10-CM

## 2019-10-26 DIAGNOSIS — I5032 Chronic diastolic (congestive) heart failure: Secondary | ICD-10-CM | POA: Diagnosis not present

## 2019-10-26 MED ORDER — VERAPAMIL HCL ER 120 MG PO CP24: 120.0000 mg | ORAL_CAPSULE | Freq: Every day | ORAL | 2 refills | Status: DC

## 2019-10-26 NOTE — Patient Instructions (Signed)
Medication Instructions:  Your physician has recommended you make the following change in your medication:  1- START Verapamil ER 120 mg by mouth once a day.  *If you need a refill on your cardiac medications before your next appointment, please call your pharmacy*  Lab Work: none If you have labs (blood work) drawn today and your tests are completely normal, you will receive your results only by: Marland Kitchen MyChart Message (if you have MyChart) OR . A paper copy in the mail If you have any lab test that is abnormal or we need to change your treatment, we will call you to review the results.  Testing/Procedures: none  Follow-Up: At [facility], you and your health needs are our priority.  As part of our continuing mission to provide you with exceptional heart care, we have created designated Provider Care Teams.  These Care Teams include your primary Cardiologist (physician) and Advanced Practice Providers (APPs -  Physician Assistants and Nurse Practitioners) who all work together to provide you with the care you need, when you need it.  Your next appointment:   6 months  The format for your next appointment:   In Person  Provider:    You may see DR Harrell Gave END or one of the following Advanced Practice Providers on your designated Care Team:    Murray Hodgkins, NP  Christell Faith, PA-C  Marrianne Mood, PA-C

## 2019-10-26 NOTE — Telephone Encounter (Signed)
Virtual Visit Pre-Appointment Phone Call  "(Name), I am calling you today to discuss your upcoming appointment. We are currently trying to limit exposure to the virus that causes COVID-19 by seeing patients at home rather than in the office."  1. "What is the BEST phone number to call the day of the visit?" - include this in appointment notes  2. Do you have or have access to (through a family member/friend) a smartphone with video capability that we can use for your visit?" a. If yes - list this number in appt notes as cell (if different from BEST phone #) and list the appointment type as a VIDEO visit in appointment notes b. If no - list the appointment type as a PHONE visit in appointment notes  3. Confirm consent - "In the setting of the current Covid19 crisis, you are scheduled for a (phone or video) visit with your provider on (date) at (time).  Just as we do with many in-office visits, in order for you to participate in this visit, we must obtain consent.  If you'd like, I can send this to your mychart (if signed up) or email for you to review.  Otherwise, I can obtain your verbal consent now.  All virtual visits are billed to your insurance company just like a normal visit would be.  By agreeing to a virtual visit, we'd like you to understand that the technology does not allow for your provider to perform an examination, and thus may limit your provider's ability to fully assess your condition. If your provider identifies any concerns that need to be evaluated in person, we will make arrangements to do so.  Finally, though the technology is pretty good, we cannot assure that it will always work on either your or our end, and in the setting of a video visit, we may have to convert it to a phone-only visit.  In either situation, we cannot ensure that we have a secure connection.  Are you willing to proceed?" STAFF: Did the patient verbally acknowledge consent to telehealth visit? Document  YES/NO here: YES  4. Advise patient to be prepared - "Two hours prior to your appointment, go ahead and check your blood pressure, pulse, oxygen saturation, and your weight (if you have the equipment to check those) and write them all down. When your visit starts, your provider will ask you for this information. If you have an Apple Watch or Kardia device, please plan to have heart rate information ready on the day of your appointment. Please have a pen and paper handy nearby the day of the visit as well."  5. Give patient instructions for MyChart download to smartphone OR Doximity/Doxy.me as below if video visit (depending on what platform provider is using)  6. Inform patient they will receive a phone call 15 minutes prior to their appointment time (may be from unknown caller ID) so they should be prepared to answer    TELEPHONE CALL NOTE  [name] has been deemed a candidate for a follow-up tele-health visit to limit community exposure during the Covid-19 pandemic. I spoke with the patient via phone to ensure availability of phone/video source, confirm preferred email & phone number, and discuss instructions and expectations.  I reminded [name] to be prepared with any vital sign and/or heart rhythm information that could potentially be obtained via home monitoring, at the time of her visit. I reminded [name] to expect a phone call prior to  her visit.  Ace Gins 10/26/2019 10:49 AM   INSTRUCTIONS FOR DOWNLOADING THE MYCHART APP TO SMARTPHONE  - The patient must first make sure to have activated MyChart and know their login information - If Apple, go to [employer] and type in MyChart in the search bar and download the app. If Android, ask patient to go to [employer] and type in Grace in the search bar and download the app. The app is free but as with any other app downloads, their phone may require them to verify saved payment information or  Apple/Android password.  - The patient will need to then log into the app with their MyChart username and password, and select [facility] as their healthcare provider to link the account. When it is time for your visit, go to the MyChart app, find appointments, and click Begin Video Visit. Be sure to Select Allow for your device to access the Microphone and Camera for your visit. You will then be connected, and your provider will be with you shortly.  **If they have any issues connecting, or need assistance please contact MyChart service desk [phone])**  **If using a computer, in order to ensure the best quality for their visit they will need to use either of the following Internet Browsers: [employer], or Google Chrome**  IF USING DOXIMITY or DOXY.ME - The patient will receive a link just prior to their visit by text.     FULL LENGTH CONSENT FOR TELE-HEALTH VISIT   I hereby voluntarily request, consent and authorize [facility] and its employed or contracted physicians, physician assistants, nurse practitioners or other licensed health care professionals (the Practitioner), to provide me with telemedicine health care services (the Services") as deemed necessary by the treating Practitioner. I acknowledge and consent to receive the Services by the Practitioner via telemedicine. I understand that the telemedicine visit will involve communicating with the Practitioner through live audiovisual communication technology and the disclosure of certain medical information by electronic transmission. I acknowledge that I have been given the opportunity to request an in-person assessment or other available alternative prior to the telemedicine visit and am voluntarily participating in the telemedicine visit.  I understand that I have the right to withhold or withdraw my consent to the use of telemedicine in the course of my care at any time, without affecting my right to future care  or treatment, and that the Practitioner or I may terminate the telemedicine visit at any time. I understand that I have the right to inspect all information obtained and/or recorded in the course of the telemedicine visit and may receive copies of available information for a reasonable fee.  I understand that some of the potential risks of receiving the Services via telemedicine include:   Delay or interruption in medical evaluation due to technological equipment failure or disruption;  Information transmitted may not be sufficient (e.g. poor resolution of images) to allow for appropriate medical decision making by the Practitioner; and/or   In rare instances, security protocols could fail, causing a breach of personal health information.  Furthermore, I acknowledge that it is my responsibility to provide information about my medical history, conditions and care that is complete and accurate to the best of my ability. I acknowledge that Practitioner's advice, recommendations, and/or decision may be based on factors not within their control, such as incomplete or inaccurate data provided by me or distortions of diagnostic images or specimens that may result from electronic transmissions. I understand  that the practice of medicine is not an exact science and that Practitioner makes no warranties or guarantees regarding treatment outcomes. I acknowledge that I will receive a copy of this consent concurrently upon execution via email to the email address I last provided but may also request a printed copy by calling the office of [facility].    I understand that my insurance will be billed for this visit.   I have read or had this consent read to me.  I understand the contents of this consent, which adequately explains the benefits and risks of the Services being provided via telemedicine.   I have been provided ample opportunity to ask questions regarding this consent and the Services and have had  my questions answered to my satisfaction.  I give my informed consent for the services to be provided through the use of telemedicine in my medical care  By participating in this telemedicine visit I agree to the above.

## 2019-10-26 NOTE — Progress Notes (Signed)
Virtual Visit via Telephone Note   This visit type was conducted due to national recommendations for restrictions regarding the COVID-19 Pandemic (e.g. social distancing) in an effort to limit this patient's exposure and mitigate transmission in our community.  Due to her co-morbid illnesses, this patient is at least at moderate risk for complications without adequate follow up.  This format is felt to be most appropriate for this patient at this time.  The patient did not have access to video technology/had technical difficulties with video requiring transitioning to audio format only (telephone).  All issues noted in this document were discussed and addressed.  No physical exam could be performed with this format.  Please refer to the patient's chart for her  consent to telehealth for [name].   Date:  10/26/2019   ID:  [name], DOB [birth year], MRN [id number]  Patient Location: Home Provider Location: Home  PCP:  Hayden Rasmussen, MD  Cardiologist:  Nelva Bush, MD Electrophysiologist:  None   Evaluation Performed:  Follow-Up Visit  Chief Complaint:  Follow-up atrial fibrillation and diastolic heart failure  History of Present Illness:    [name] is a 58 y.o. female with history of chronic diastolic heart failure, paroxysmal atrial fibrillation, obstructive sleep apnea, hypertension, and type 2 diabetes mellitus.  We are speaking today for follow-up of atrial fibrillation.  We met in May, at which time Ms. Duignan wished to establish cardiac care in the area after having moved from [place].  She was asymptomatic other than chronic leg edema managed with compression stockings and furosemide.  We did not make any medication changes or pursue additional testing at that time.  Today, Ms. Vigna reports that she has been feeling well.  She denies chest pain,, palpitations, and lightheadedness.  Chronic edema is unchanged.  She also reports stable exertional dyspnea;  she wonders if this could be due to chronic anemia currently being managed by Dr. Darron Doom.  Ms. Gish has been under a lot of stress recently, as her husband has moved from [place] -> [place] and subsequently fell and required a 5-day hospitalization.  She is not on an AV-nodal blocking agent, as she was previously intolerant of metoprolol and diltiazem due to fatigue.  She remains on flecainide.  The patient does not have symptoms concerning for COVID-19 infection (fever, chills, cough, or new shortness of breath).    Past Medical History:  Diagnosis Date  . Asthma   . Atrial fibrillation ([place]) 06/2014  . Breast cancer ([place])   . GE reflux   . HTN (hypertension)   . Type II or unspecified type diabetes mellitus without mention of complication, not stated as uncontrolled    Past Surgical History:  Procedure Laterality Date  . CARDIAC CATHETERIZATION  02/16/2018   No stents;[place]. Collene [place] [facility]  . FINGER SURGERY Right   . MASTECTOMY Right      Current Meds  Medication Sig  . albuterol (PROVENTIL HFA;VENTOLIN HFA) 108 (90 Base) MCG/ACT inhaler Inhale 2 puffs into the lungs every 6 (six) hours as needed for wheezing or shortness of breath.  Marland Kitchen apixaban (ELIQUIS) 5 MG TABS tablet Take 5 mg by mouth 2 (two) times daily.  Marland Kitchen buPROPion (WELLBUTRIN XL) 300 MG 24 hr tablet Take 300 mg by mouth daily.  . cholecalciferol (VITAMIN D3) 25 MCG (1000 UT) tablet Take 1,000 Units by mouth daily.  . DULoxetine (CYMBALTA) 20 MG capsule Take 20 mg by mouth daily.  Marland Kitchen  ferrous sulfate 325 (65 FE) MG tablet Take 1 tablet (325 mg total) by mouth daily. (Patient taking differently: Take 325 mg by mouth as directed. Taking 2-3 times per week)  . fexofenadine (ALLEGRA) 180 MG tablet Take 180 mg by mouth daily.  . flecainide (TAMBOCOR) 50 MG tablet Take 1 tablet (50 mg total) by mouth 2 (two) times daily.  . fluticasone (FLONASE) 50 MCG/ACT nasal spray Place 1 spray into both nostrils daily.  .  furosemide (LASIX) 40 MG tablet Take 40 mg by mouth.  . levothyroxine (SYNTHROID) 50 MCG tablet Take 50 mcg by mouth daily before breakfast.  . lisinopril (PRINIVIL,ZESTRIL) 40 MG tablet Take 40 mg by mouth daily.  . metFORMIN (GLUMETZA) 500 MG (MOD) 24 hr tablet Take 1,000 mg by mouth 2 (two) times daily with a meal.   . montelukast (SINGULAIR) 10 MG tablet Take 10 mg by mouth at bedtime.  Marland Kitchen omeprazole (PRILOSEC) 20 MG capsule Take 20 mg by mouth 2 (two) times daily before a meal.     Allergies:   Erythromycin   Social History   Tobacco Use  . Smoking status: Former Smoker    Packs/day: 0.30    Years: 4.00    Pack years: 1.20    Types: Cigarettes    Quit date: 12/22/1983    Years since quitting: 35.8  . Smokeless tobacco: Never Used  Substance Use Topics  . Alcohol use: Not Currently  . Drug use: No     Family Hx: The patient's family history includes Angina in her mother; Atrial fibrillation in her mother; Heart Problems in her father and mother; Hypertension in her father and mother.  ROS:   Please see the history of present illness.   All other systems reviewed and are negative.   Prior CV studies:   The following studies were reviewed today:  Cath/PCI:  LHC (02/16/2018): Mild luminal irregularities involving the LAD, LCx, and RCA.  LVEDP 34 mmHg.  Non-Invasive Evaluation(s):  Myocardial PET/CT (01/07/2018): Small in size, moderate in severity, reversible anterior defect that may represent ischemia.  Small to moderate in size, moderate in severity, apical lateral and apical defect that is partially reversible and may represent ischemia.  LVEF 61%.  TTE (11/09/2017): Normal LV size and function.  LVEF 60-65% with normal diastolic function.  Normal RV size and function.  Mitral annular calcification present.  Labs/Other Tests and Data Reviewed:    EKG:  No ECG reviewed.  Recent Labs: 12/11/2018: BUN 15; Creatinine, Ser 0.66; Hemoglobin 10.9; Platelets 291; Potassium  3.8; Sodium 140; TSH 1.978   Recent Lipid Panel No results found for: CHOL, TRIG, HDL, CHOLHDL, LDLCALC, LDLDIRECT  Wt Readings from Last 3 Encounters:  04/21/19 (!) 396 lb (179.6 kg)  01/10/19 (!) 393 lb 9.6 oz (178.5 kg)  12/11/18 (!) 380 lb (172.4 kg)     Objective:    Vital Signs:  Ht 5' 5" (1.651 m)   BMI 65.90 kg/m    VITAL SIGNS:  reviewed  ASSESSMENT & PLAN:    Paroxysmal atrial fibrillation: No symptoms to suggest recurrence.  We will continue apixaban and flecainide.  I am concerned about continued flecainide use with AV-nodal blockade.  As Ms. Nola has been intolerant of metoprolol and diltiazem in the remote past, we will add verapamil 120 mg daily.  Chronic HFpEF: Stable edema without significant dyspnea.  Continue furosemide 40 mg daily.  Hypertension: No recent home blood pressure readings.  We will add low-dose verapamil, as above, for  AV nodal blockade.  No other medication changes.  Morbid obesity: Weight loss encouraged through diet and exercise.  COVID-19 Education: The signs and symptoms of COVID-19 were discussed with the patient and how to seek care for testing (follow up with PCP or arrange E-visit).  The importance of social distancing was discussed today.  Time:   Today, I have spent 20 minutes with the patient with telehealth technology discussing the above problems.     Medication Adjustments/Labs and Tests Ordered: Current medicines are reviewed at length with the patient today.  Concerns regarding medicines are outlined above.   Tests Ordered: No orders of the defined types were placed in this encounter.   Medication Changes: Meds ordered this encounter  Medications  . verapamil (VERELAN PM) 120 MG 24 hr capsule    Sig: Take 1 capsule (120 mg total) by mouth at bedtime.    Dispense:  90 capsule    Refill:  2    Follow Up:  In Person in 6 month(s)  Signed, Nelva Bush, MD  10/26/2019 6:38 PM    [place]

## 2020-03-25 ENCOUNTER — Emergency Department (HOSPITAL_COMMUNITY)
Admission: EM | Admit: 2020-03-25 | Discharge: 2020-03-26 | Disposition: A | Discharge status: Home / Self Care | Payer: 59 | Attending: Emergency Medicine | Admitting: Emergency Medicine

## 2020-03-25 DIAGNOSIS — Z7901 Long term (current) use of anticoagulants: Secondary | ICD-10-CM | POA: Diagnosis not present

## 2020-03-25 DIAGNOSIS — Z87891 Personal history of nicotine dependence: Secondary | ICD-10-CM | POA: Insufficient documentation

## 2020-03-25 DIAGNOSIS — Z853 Personal history of malignant neoplasm of breast: Secondary | ICD-10-CM | POA: Insufficient documentation

## 2020-03-25 DIAGNOSIS — J45909 Unspecified asthma, uncomplicated: Secondary | ICD-10-CM | POA: Insufficient documentation

## 2020-03-25 DIAGNOSIS — I110 Hypertensive heart disease with heart failure: Secondary | ICD-10-CM | POA: Insufficient documentation

## 2020-03-25 DIAGNOSIS — E119 Type 2 diabetes mellitus without complications: Secondary | ICD-10-CM | POA: Diagnosis not present

## 2020-03-25 DIAGNOSIS — R002 Palpitations: Secondary | ICD-10-CM | POA: Diagnosis present

## 2020-03-25 DIAGNOSIS — Z7984 Long term (current) use of oral hypoglycemic drugs: Secondary | ICD-10-CM | POA: Insufficient documentation

## 2020-03-25 DIAGNOSIS — I509 Heart failure, unspecified: Secondary | ICD-10-CM | POA: Insufficient documentation

## 2020-03-25 DIAGNOSIS — Z79899 Other long term (current) drug therapy: Secondary | ICD-10-CM | POA: Insufficient documentation

## 2020-03-25 DIAGNOSIS — I4891 Unspecified atrial fibrillation: Secondary | ICD-10-CM

## 2020-03-25 NOTE — ED Triage Notes (Signed)
Pt comes via [facility] for afib RVR that has now resolved, upon EMS arrival pt had HR of 180, pt converted herself to NSR 90's

## 2020-03-26 ENCOUNTER — Encounter (HOSPITAL_COMMUNITY): Payer: Self-pay

## 2020-03-26 ENCOUNTER — Other Ambulatory Visit: Payer: Self-pay

## 2020-03-26 ENCOUNTER — Telehealth (HOSPITAL_COMMUNITY): Payer: Self-pay

## 2020-03-26 ENCOUNTER — Emergency Department (HOSPITAL_COMMUNITY): Payer: 59

## 2020-03-26 LAB — CBC
HCT: 38.7 % (ref 36.0–46.0)
Hemoglobin: 11.9 g/dL — ABNORMAL LOW (ref 12.0–15.0)
MCH: 27.7 pg (ref 26.0–34.0)
MCHC: 30.7 g/dL (ref 30.0–36.0)
MCV: 90.2 fL (ref 80.0–100.0)
Platelets: 330 10*3/uL (ref 150–400)
RBC: 4.29 MIL/uL (ref 3.87–5.11)
RDW: 14.6 % (ref 11.5–15.5)
WBC: 10.9 10*3/uL — ABNORMAL HIGH (ref 4.0–10.5)
nRBC: 0 % (ref 0.0–0.2)

## 2020-03-26 LAB — BASIC METABOLIC PANEL
Anion gap: 14 (ref 5–15)
BUN: 15 mg/dL (ref 6–20)
CO2: 28 mmol/L (ref 22–32)
Calcium: 9.4 mg/dL (ref 8.9–10.3)
Chloride: 97 mmol/L — ABNORMAL LOW (ref 98–111)
Creatinine, Ser: 0.79 mg/dL (ref 0.44–1.00)
GFR calc Af Amer: >60 mL/min (ref >60)
GFR calc non Af Amer: >60 mL/min (ref >60)
Glucose, Bld: 139 mg/dL — ABNORMAL HIGH (ref 70–99)
Potassium: 4.1 mmol/L (ref 3.5–5.1)
Sodium: 139 mmol/L (ref 135–145)

## 2020-03-26 LAB — I-STAT BETA HCG BLOOD, ED (MC, WL, AP ONLY): I-stat hCG, quantitative: <5 m[IU]/mL (ref <5)

## 2020-03-26 MED ORDER — SODIUM CHLORIDE 0.9% FLUSH: 3.0000 mL | Freq: Once | INTRAVENOUS | Status: DC

## 2020-03-26 NOTE — Discharge Instructions (Addendum)
Followup with cardiologist. Return to the ER if your a-fib returns.

## 2020-03-26 NOTE — Telephone Encounter (Signed)
Patient showed up on ED report. Reached out to schedule follow-up appointment. Patient stated she will reached out to PCP or Cardiologist. Advised to call back if she would like to be seen sooner.

## 2020-03-26 NOTE — ED Provider Notes (Signed)
This patient is a 59 year old female, she has a history of paroxysmal atrial fibrillation and is currently on flecainide as well as Eliquis, she takes a calcium channel blocker, she usually does very well however recently as of last night the patient developed acute onset of palpitations, she felt kind of sweaty, by the time the paramedics arrived she had improved and by the time she arrived to the ER she was in a normal sinus rhythm.  She spent several hours in the waiting room prior to being evaluated by her provider due to volume and lack of space.  On my exam the patient is in normal sinus rhythm without any symptoms whatsoever.  She is clear in the lungs, heart sounds are normal, no murmur, no edema, no distress, reviewed her work-up, everything seems okay, EKG is unremarkable and nonischemic, she is okay to go home to follow-up with cardiology.  She is already anticoagulated.  She is agreeable to the plan.  Negative heart cath 3 years ago in [place]  Medical screening examination/treatment/procedure(s) were conducted as a shared visit with non-physician practitioner(s) and myself.  I personally evaluated the patient during the encounter.  Clinical Impression:   Final diagnoses:  Atrial fibrillation with RVR ([place])    EKG Interpretation  Date/Time:  Monday March 25 2020 23:55:40 EDT Ventricular Rate:  92 PR Interval:  150 QRS Duration: 84 QT Interval:  350 QTC Calculation: 432 R Axis:   72 Text Interpretation: Normal sinus rhythm Normal ECG since last tracing no significant change Confirmed by Noemi Chapel [phone]) on 03/26/2020 8:27:53 AM         Noemi Chapel, MD 03/26/20 [phone]

## 2020-03-26 NOTE — ED Provider Notes (Signed)
[facility] EMERGENCY DEPARTMENT Provider Note   CSN: [id number] Arrival date & time: 03/25/20  2355     History Chief Complaint  Patient presents with  . Atrial Fibrillation    [name] is a 59 y.o. female.  HPI  59 year old female with a history of paroxysmal A. fib on Eliquis, flecainide, and verapamil along with CHF, DM type II, HTN presents to the ER via EMS after going into A. fib with RVR around 10:20 PM last night.  Patient reports that normally when she goes into A. fib she will feel cold and like she needs to urinate, but this time she started feeling clammy with palpitations.  She put on her CPAP to increase airflow while she was waiting for EMS.  Patient reports that she self converted on the ambulance on the way to the ER.  She feels like she is in sinus rhythm and has no complaints at this time.  Patient reports that she has been on Eliquis since 2015.  Patient follows with Dr. Harrell Gave End at [facility]  Past Medical History:  Diagnosis Date  . Asthma   . Atrial fibrillation ([place]) 06/2014  . Breast cancer ([place])   . GE reflux   . HTN (hypertension)   . Type II or unspecified type diabetes mellitus without mention of complication, not stated as uncontrolled     Patient Active Problem List   Diagnosis Date Noted  . Paroxysmal atrial fibrillation ([place]) 04/21/2019  . Chronic heart failure with preserved ejection fraction (HFpEF) ([place]) 04/21/2019  . Type 2 diabetes mellitus without complication, without long-term current use of insulin ([place]) 04/21/2019  . Hypertension 10/05/2011  . Dependent edema 10/05/2011  . History of breast cancer 10/05/2011  . Morbid obesity ([place]) 10/05/2011    Past Surgical History:  Procedure Laterality Date  . CARDIAC CATHETERIZATION  02/16/2018   No stents;[place]. Collene [place] [facility]  . FINGER SURGERY Right   . MASTECTOMY Right      OB History   No obstetric history on file.     Family  History  Problem Relation Age of Onset  . Heart Problems Mother   . Hypertension Mother   . Angina Mother   . Atrial fibrillation Mother   . Heart Problems Father   . Hypertension Father     Social History   Tobacco Use  . Smoking status: Former Smoker    Packs/day: 0.30    Years: 4.00    Pack years: 1.20    Types: Cigarettes    Quit date: 12/22/1983    Years since quitting: 36.2  . Smokeless tobacco: Never Used  Substance Use Topics  . Alcohol use: Not Currently  . Drug use: No    Home Medications Prior to Admission medications   Medication Sig Start Date End Date Taking? Authorizing Provider  albuterol (PROVENTIL HFA;VENTOLIN HFA) 108 (90 Base) MCG/ACT inhaler Inhale 2 puffs into the lungs every 6 (six) hours as needed for wheezing or shortness of breath. 04/10/17   Barry Dienes, NP  apixaban (ELIQUIS) 5 MG TABS tablet Take 5 mg by mouth 2 (two) times daily.    [provider]  buPROPion (WELLBUTRIN XL) 300 MG 24 hr tablet Take 300 mg by mouth daily.    [provider]  cholecalciferol (VITAMIN D3) 25 MCG (1000 UT) tablet Take 1,000 Units by mouth daily.    [provider]  DULoxetine (CYMBALTA) 20 MG capsule Take 20 mg by mouth daily.  [provider]  ferrous sulfate 325 (65 FE) MG tablet Take 1 tablet (325 mg total) by mouth daily. Patient taking differently: Take 325 mg by mouth as directed. Taking 2-3 times per week 12/11/18   Isla Pence, MD  fexofenadine (ALLEGRA) 180 MG tablet Take 180 mg by mouth daily.    [provider]  flecainide (TAMBOCOR) 50 MG tablet Take 1 tablet (50 mg total) by mouth 2 (two) times daily. 08/08/19   End, Harrell Gave, MD  fluticasone (FLONASE) 50 MCG/ACT nasal spray Place 1 spray into both nostrils daily.    [provider]  furosemide (LASIX) 40 MG tablet Take 40 mg by mouth.    [provider]  levothyroxine (SYNTHROID) 50 MCG tablet Take 50 mcg by mouth daily before  breakfast.    [provider]  lisinopril (PRINIVIL,ZESTRIL) 40 MG tablet Take 40 mg by mouth daily.    [provider]  metFORMIN (GLUMETZA) 500 MG (MOD) 24 hr tablet Take 1,000 mg by mouth 2 (two) times daily with a meal.     [provider]  montelukast (SINGULAIR) 10 MG tablet Take 10 mg by mouth at bedtime.    [provider]  omeprazole (PRILOSEC) 20 MG capsule Take 20 mg by mouth 2 (two) times daily before a meal.    [provider]  verapamil (VERELAN PM) 120 MG 24 hr capsule Take 1 capsule (120 mg total) by mouth at bedtime. 10/26/19   End, Harrell Gave, MD    Allergies    Erythromycin  Review of Systems   Review of Systems  Constitutional: Negative for fatigue.  Respiratory: Negative for cough, chest tightness and shortness of breath.   Cardiovascular: Negative for chest pain, palpitations and leg swelling.  Gastrointestinal: Negative for abdominal pain and vomiting.  Neurological: Negative for dizziness, syncope and weakness.  Psychiatric/Behavioral: Negative for confusion.  All other systems reviewed and are negative.   Physical Exam Updated Vital Signs BP (!) 144/75 (BP Location: Right Arm)   Pulse 88   Temp 98.4 F (36.9 C) (Oral)   Resp 20   Ht 5\' 5"  (1.651 m)   Wt (!) 179 kg   SpO2 99%   BMI 65.67 kg/m   Physical Exam Vitals reviewed.  Constitutional:      Appearance: Normal appearance.  HENT:     Head: Normocephalic.  Neck:     Vascular: No carotid bruit.  Cardiovascular:     Rate and Rhythm: Normal rate and regular rhythm.     Pulses: Normal pulses.     Heart sounds: Normal heart sounds, S1 normal and S2 normal. No murmur. No friction rub. No gallop.   Pulmonary:     Effort: Pulmonary effort is normal.     Breath sounds: Normal breath sounds.  Abdominal:     General: Abdomen is flat.     Palpations: Abdomen is soft.  Musculoskeletal:     Right lower leg: No edema.     Left lower leg: No edema.   Skin:    General: Skin is warm and dry.  Neurological:     Mental Status: She is alert.  Psychiatric:        Mood and Affect: Mood normal.        Behavior: Behavior normal.     ED Results / Procedures / Treatments   Labs (all labs ordered are listed, but only abnormal results are displayed) Labs Reviewed  BASIC METABOLIC PANEL - Abnormal; Notable for the following components:  Result Value   Chloride 97 (*)    Glucose, Bld 139 (*)    All other components within normal limits  CBC - Abnormal; Notable for the following components:   WBC 10.9 (*)    Hemoglobin 11.9 (*)    All other components within normal limits  I-STAT BETA HCG BLOOD, ED ([facility], [facility], [facility] ONLY)    EKG EKG Interpretation  Date/Time:  Monday March 25 2020 23:55:40 EDT Ventricular Rate:  92 PR Interval:  150 QRS Duration: 84 QT Interval:  350 QTC Calculation: 432 R Axis:   72 Text Interpretation: Normal sinus rhythm Normal ECG since last tracing no significant change Confirmed by Noemi Chapel [phone]) on 03/26/2020 8:27:53 AM   Radiology DG Chest 2 View  Result Date: 03/26/2020 CLINICAL DATA:  Atrial fibrillation. EXAM: CHEST - 2 VIEW COMPARISON:  December 11, 2018 FINDINGS: Mild, chronic appearing increased lung markings are seen without evidence of acute infiltrate, pleural effusion or pneumothorax. Radiopaque surgical clips are seen overlying the lateral aspect of the upper right lung. The heart size and mediastinal contours are within normal limits. The visualized skeletal structures are unremarkable. IMPRESSION: No active cardiopulmonary disease. Electronically Signed   By: Virgina Norfolk M.D.   On: 03/26/2020 00:58    Procedures Procedures (including critical care time)  Medications Ordered in ED Medications  sodium chloride flush (NS) 0.9 % injection 3 mL (has no administration in time range)    ED Course  I have reviewed the triage vital signs and the nursing notes.  Pertinent labs &  imaging results that were available during my care of the patient were reviewed by me and considered in my medical decision making (see chart for details).    MDM Rules/Calculators/A&P                     59 year old female presents via EMS for an acute episode of A. fib with RVR, self converting in the ambulance on the way to the ER. Patient is well-appearing,in  no acute distress, not tachycardic, diaphoretic, tachypneic. EKG normal sinus rhythm with no ischemic changes. Lung sounds clear, normal heart sounds, no murmurs, swelling, SOB, in no acute distress.  Patient states that she is back to baseline. Patient has spent the last 5 hours in the ER waiting room without any repeat episodes of A. fib.  She is already taking Eliquis.  At this stage in the ED course she is stable for discharge, with close follow-up with cardiology.  I discussed this with the patient and she is agreeable with this plan.  Patient was evaluated by Dr. Noemi Chapel who is agreeable with the above plan. Final Clinical Impression(s) / ED Diagnoses Final diagnoses:  Atrial fibrillation with RVR [facility])    Rx / DC Orders ED Discharge Orders    None       Lyndel Safe 03/26/20 1017    Noemi Chapel, MD 03/27/20 [phone]

## 2020-03-27 ENCOUNTER — Ambulatory Visit (INDEPENDENT_AMBULATORY_CARE_PROVIDER_SITE_OTHER): Payer: 59 | Admitting: Family

## 2020-03-27 ENCOUNTER — Encounter: Payer: Self-pay | Admitting: Family

## 2020-03-27 VITALS — BP 148/76 | HR 79 | Ht 65.0 in | Wt 371.4 lb

## 2020-03-27 DIAGNOSIS — I5032 Chronic diastolic (congestive) heart failure: Secondary | ICD-10-CM | POA: Diagnosis not present

## 2020-03-27 DIAGNOSIS — I480 Paroxysmal atrial fibrillation: Secondary | ICD-10-CM

## 2020-03-27 DIAGNOSIS — Z7901 Long term (current) use of anticoagulants: Secondary | ICD-10-CM

## 2020-03-27 DIAGNOSIS — I10 Essential (primary) hypertension: Secondary | ICD-10-CM | POA: Diagnosis not present

## 2020-03-27 DIAGNOSIS — F4321 Adjustment disorder with depressed mood: Secondary | ICD-10-CM

## 2020-03-27 NOTE — Progress Notes (Signed)
Office Visit    Patient Name: [name] Date of Encounter: 03/27/2020  Primary Care Provider:  Hayden Rasmussen, MD Primary Cardiologist:  No primary care provider on file. Electrophysiologist:  None   Chief Complaint    [name] is a 59 y.o. female with a hx of chronic diastolic heart failure, paroxysmal atrial fibrillation, OSA, HTN, DM 2 presents today for follow-up after ED visit  Past Medical History    Past Medical History:  Diagnosis Date  . Asthma   . Atrial fibrillation ([place]) 06/2014  . Breast cancer ([place])   . GE reflux   . HTN (hypertension)   . Type II or unspecified type diabetes mellitus without mention of complication, not stated as uncontrolled    Past Surgical History:  Procedure Laterality Date  . CARDIAC CATHETERIZATION  02/16/2018   No stents;[place]. Collene [place] [facility]  . FINGER SURGERY Right   . MASTECTOMY Right     Allergies  Allergies  Allergen Reactions  . Erythromycin Diarrhea    History of Present Illness    [name] is a 59 y.o. female with a hx of chronic diastolic heart failure, paroxysmal atrial fibrillation, OSA, HTN, DM 2, anemia last seen 10/26/2019 by Dr. Saunders Revel.  Established with Dr. Saunders Revel May 2020 after moving from [place].  At the time asymptomatic other than chronic leg edema well controlled with compression stockings and Lasix.  She has a previous intolerance of metoprolol and diltiazem with fatigue.  At last visit 10/26/2019 she was started on verapamil 120 mg daily in order to add AV nodal blocking agent to her flecainide.  She was seen in the ED 03/25/2020 after being brought in by EMS for "A. fib with RVR.  She self converted in the ambulance on the way to the ER.  EKG in the ED showed sinus rhythm with no acute ST/T wave changes.  Her labs in the ED showed K4.1, creatinine 0.79, GFR > 60, hemoglobin 11.9.  Episode happened about 10:20 at night. EMS had Cardizem mixed and waiting to give the  dose when she self-converted. Does not drink caffeine nor alcohol.  Reports increased stress over the last five years with her mother passing in a car accident and settling her estate.  No noted prescription or over-the-counter proarrhythmic medications.   Reports her blood pressure and heart rate have improved since the addition of verapamil.  She does not routinely check her blood pressure at home.  Does note that she has fatigue but attributes this more so to her anxiety and depression rather than to her verapamil. Reports no chest pain, pressure, tightness.    Has an appointment with her primary care provider on 04/05/20 for "full labs" and to talk with her about her depression and anxiety.    EKGs/Labs/Other Studies Reviewed:   The following studies were reviewed today:  EKG:  EKG is ordered today.  The ekg ordered today demonstrates SR 79 bpm with no acute ST/T wave changes.  Recent Labs: 03/26/2020: BUN 15; Creatinine, Ser 0.79; Hemoglobin 11.9; Platelets 330; Potassium 4.1; Sodium 139  Recent Lipid Panel No results found for: CHOL, TRIG, HDL, CHOLHDL, VLDL, LDLCALC, LDLDIRECT  Home Medications   Current Meds  Medication Sig  . albuterol (PROVENTIL HFA;VENTOLIN HFA) 108 (90 Base) MCG/ACT inhaler Inhale 2 puffs into the lungs every 6 (six) hours as needed for wheezing or shortness of breath.  Marland Kitchen apixaban (ELIQUIS) 5 MG TABS tablet Take 5 mg  by mouth 2 (two) times daily.  Marland Kitchen buPROPion (WELLBUTRIN XL) 300 MG 24 hr tablet Take 300 mg by mouth daily.  . cholecalciferol (VITAMIN D3) 25 MCG (1000 UT) tablet Take 1,000 Units by mouth daily.  . DULoxetine (CYMBALTA) 20 MG capsule Take 20 mg by mouth daily.  . ferrous sulfate 325 (65 FE) MG tablet Take 1 tablet (325 mg total) by mouth daily. (Patient taking differently: Take 325 mg by mouth as directed. Taking 2-3 times per week)  . fexofenadine (ALLEGRA) 180 MG tablet Take 180 mg by mouth daily.  . flecainide (TAMBOCOR) 50 MG tablet Take 1  tablet (50 mg total) by mouth 2 (two) times daily.  . fluticasone (FLONASE) 50 MCG/ACT nasal spray Place 1 spray into both nostrils daily.  . furosemide (LASIX) 40 MG tablet Take 40 mg by mouth.  . levothyroxine (SYNTHROID) 50 MCG tablet Take 50 mcg by mouth daily before breakfast.  . lisinopril (PRINIVIL,ZESTRIL) 40 MG tablet Take 40 mg by mouth daily.  . metFORMIN (GLUMETZA) 500 MG (MOD) 24 hr tablet Take 1,000 mg by mouth 2 (two) times daily with a meal.   . montelukast (SINGULAIR) 10 MG tablet Take 10 mg by mouth at bedtime.  Marland Kitchen omeprazole (PRILOSEC) 20 MG capsule Take 20 mg by mouth 2 (two) times daily before a meal.  . verapamil (VERELAN PM) 120 MG 24 hr capsule Take 1 capsule (120 mg total) by mouth at bedtime.    Review of Systems   Review of Systems  Constitution: Negative for chills, fever and malaise/fatigue.  Cardiovascular: Positive for palpitations. Negative for chest pain, dyspnea on exertion, irregular heartbeat, leg swelling, near-syncope, orthopnea and syncope.  Respiratory: Negative for cough, shortness of breath and wheezing.   Gastrointestinal: Negative for melena, nausea and vomiting.  Genitourinary: Negative for hematuria.  Neurological: Negative for dizziness, light-headedness and weakness.   All other systems reviewed and are otherwise negative except as noted above.  Physical Exam    VS:  BP (!) 148/76 (BP Location: Left Arm, Patient Position: Sitting, Cuff Size: Large)   Pulse 79   Ht 5\' 5"  (1.651 m)   Wt (!) 371 lb 6 oz (168.5 kg)   SpO2 97%   BMI 61.80 kg/m  , BMI Body mass index is 61.8 kg/m. GEN: Well nourished, overweight, well developed, in no acute distress. HEENT: normal. Neck: Supple, no JVD, carotid bruits, or masses. Cardiac: RRR, no murmurs, rubs, or gallops. No clubbing, cyanosis, edema.  Radials/DP/PT 2+ and equal bilaterally.  Respiratory:  Respirations regular and unlabored, clear to auscultation bilaterally. GI: Soft, nontender,  nondistended, BS + x 4. MS: No deformity or atrophy. Skin: Warm and dry, no rash. Neuro:  Strength and sensation are intact. Psych: Normal affect.  Accessory Clinical Findings    ECG personally reviewed by me today - SR 79 bpm with no acute ST/T wave changes - no acute changes.  Assessment & Plan    1. PAF/chronic anticoagulation - Recurrent episode 03/25/2020 in setting of stress. Self converted in the ambulance without intervention.  EKG in ED sinus rhythm and no acute ST/T wave changes, BMP and CBC normal.  Does not drink etoh nor smoke. No caffeine. No proarrthymic medications noted. No missed doses of Flecainide. EKG today NSR with QTc 424.   Continue Eliquis 5 mg twice daily secondary PAF with CHADS2VASc of at least 3 (female, HTN, HFpEF). Denies bleeding complications.   Continue flecainide 50 mg twice daily. Appropriate AV nodal blocking agent of Verapamil.  Continue verapamil 120 mg daily. Discussed increasing her verapamil for optimization of BP and better control of atrial fib -she is hesitant to do so prior to upcoming appoint with PCP for "full labs" as she is worried about fatigue.  We will consider increased dose at follow-up in 1 month. If she has recurrent atrial fibrillation could consider increased dose of Flecainide provided she stays on AV nodal blocking agent.   2. Chronic HFpEF - Euvolemic and well compensated on exam.  Present GDMT includes Lisinopril 40 mg, Lasix 40 mg.  Previous intolerance of beta-blocker with fatigue.    3. HTN - BP elevated today.  She has a blood pressure cuff at home and was encouraged to check 2-3 times per week. Consider increased dose Verapamil at follow up, as above.    4. Morbid obesity - Weight loss via diet and exercise encouraged.  5. Grief -Long discussion regarding the loss of her mother.  Grief and recent stress likely contributory to recurrent atrial fibrillation.  Tells me that just last Friday she got the final notice from the estate  letter that her mother's estate is closed.  6. Fatigue - Likely multifactorial grief, anxiety, depression, deconditioning.  Low suspicion that verapamil is contributory.  Has upcoming appoint with her PCP for labs and discussion of fatigue.  Will defer TSH for now she prefers to have all of her labs drawn one time.  7. OSA - Continued CPAP compliance encouraged.  Disposition: Follow up in 1 month(s) with Dr. Benjaman Pott, NP 03/27/2020, 8:21 AM

## 2020-03-27 NOTE — Patient Instructions (Signed)
Medication Instructions:  No medication changes today.   *If you need a refill on your cardiac medications before your next appointment, please call your pharmacy*  Lab Work: No lab work today. Blood work from the hospital looked good!  Testing/Procedures: Your EKG today showed normal sinus rhythm.   Follow-Up: At [facility], you and your health needs are our priority.  As part of our continuing mission to provide you with exceptional heart care, we have created designated Provider Care Teams.  These Care Teams include your primary Cardiologist (physician) and Advanced Practice Providers (APPs -  Physician Assistants and Nurse Practitioners) who all work together to provide you with the care you need, when you need it.  We recommend signing up for the patient portal called "MyChart".  Sign up information is provided on this After Visit Summary.  MyChart is used to connect with patients for Virtual Visits (Telemedicine).  Patients are able to view lab/test results, encounter notes, upcoming appointments, etc.  Non-urgent messages can be sent to your provider as well.   To learn more about what you can do with MyChart, go to [email or web address].    Your next appointment:   1 month(s)  The format for your next appointment:   In Person  Provider:   You may see Nelva Bush, MD or one of the following Advanced Practice Providers on your designated Care Team:    Murray Hodgkins, NP  Christell Faith, PA-C  Marrianne Mood, PA-C  Other Instructions  We will be thinking of you!   Your recent bout of atrial fibrillation was likely due to recent stress.    Your electrolytes and blood counts were both good in the hospital!   Recommend checking your blood pressure 2-3 times per week and keeping a log. Bring this log to your next office visit.   Tips to Measure your Blood Pressure Correctly  To determine whether you have hypertension, a medical professional will take a blood  pressure reading. How you prepare for the test, the position of your arm, and other factors can change a blood pressure reading by 10% or more. That could be enough to hide high blood pressure, start you on a drug you don't really need, or lead your doctor to incorrectly adjust your medications.  National and international guidelines offer specific instructions for measuring blood pressure. If a doctor, nurse, or medical assistant isn't doing it right, don't hesitate to ask him or her to get with the guidelines.  Here's what you can do to ensure a correct reading: . Don't drink a caffeinated beverage or smoke during the 30 minutes before the test. . Sit quietly for five minutes before the test begins. . During the measurement, sit in a chair with your feet on the floor and your arm supported so your elbow is at about heart level. . The inflatable part of the cuff should completely cover at least 80% of your upper arm, and the cuff should be placed on bare skin, not over a shirt. . Don't talk during the measurement. . Have your blood pressure measured twice, with a brief break in between. If the readings are different by 5 points or more, have it done a third time.  There are times to break these rules. If you sometimes feel lightheaded when getting out of bed in the morning or when you stand after sitting, you should have your blood pressure checked while seated and then while standing to see if it falls from one position  to the next.  Because blood pressure varies throughout the day, your doctor will rarely diagnose hypertension on the basis of a single reading. Instead, he or she will want to confirm the measurements on at least two occasions, usually within a few weeks of one another. The exception to this rule is if you have a blood pressure reading of 180/110 mm Hg or higher. A result this high usually calls for prompt treatment.  In 2017, new guidelines from the [place], the  [employer] of Cardiology, and nine other health organizations lowered the diagnosis of high blood pressure to 130/80 mm Hg or higher for all adults. The guidelines also redefined the various blood pressure categories to now include normal, elevated, Stage 1 hypertension, Stage 2 hypertension, and hypertensive crisis (see "Blood pressure categories").  Blood pressure categories  Blood pressure category SYSTOLIC (upper number)  DIASTOLIC (lower number)  Normal Less than 120 mm Hg and Less than 80 mm Hg  Elevated 120-129 mm Hg and Less than 80 mm Hg  High blood pressure: Stage 1 hypertension 130-139 mm Hg or 80-89 mm Hg  High blood pressure: Stage 2 hypertension 140 mm Hg or higher or 90 mm Hg or higher  Hypertensive crisis (consult your doctor immediately) Higher than 180 mm Hg and/or Higher than 120 mm Hg  Source: American Heart Association and American Stroke Association. For more on getting your blood pressure under control, buy Controlling Your Blood Pressure, a Special Health Report from [facility].   Blood Pressure Log   Date   Time  Blood Pressure  Position  Example: Nov 1 9 AM 124/78 sitting

## 2020-04-10 ENCOUNTER — Other Ambulatory Visit: Payer: Self-pay | Admitting: Family Medicine

## 2020-04-10 ENCOUNTER — Other Ambulatory Visit: Payer: Self-pay | Admitting: Physician Assistant

## 2020-04-10 DIAGNOSIS — R109 Unspecified abdominal pain: Secondary | ICD-10-CM

## 2020-04-16 ENCOUNTER — Telehealth: Payer: Self-pay | Admitting: Internal Medicine

## 2020-04-16 NOTE — Telephone Encounter (Signed)
Patient states her PCP changed her Verapamil, due to consistent elevated high BP. Please call to discuss. Patient also cancelled 5/5 appointment with Dr. Saunders Revel and states her PCP will be treating her.

## 2020-04-16 NOTE — Telephone Encounter (Signed)
Patient still engaged with a family gathering. I will call her back tomorrow to give recommendations.

## 2020-04-16 NOTE — Telephone Encounter (Signed)
Patient said she is seeing her PCP in about 3 weeks as well and did not see the point in having so many appointments. She said her PCP could check her BP and let us know if things are abnormal.  Advised patient that it will be good to keep ongoing f/u with Dr End due to her aFib and maintenance of medications. She agreed to schedule follow up for in about 3 months but could not schedule at the moment because she had company.  Routing to Laurann Montana, NP who saw patient last to make sure 3 month f/u is ok.

## 2020-04-16 NOTE — Telephone Encounter (Signed)
3 month follow up will be fine. Mainly had her following up in regards to Verapamil as I had asked her to increase her dose due to elevated BP and she politely declined. Seems like her PCP has increased the dose.   Recommend BP goal <130/80 and that she monitor at home. If she has recurrent atrial fibrillation or consistently elevated BP, recommend she be seen sooner. As she is on Flecainide we should keep 3 month follow up so we can do EKG.   Loel Dubonnet, NP

## 2020-04-18 NOTE — Telephone Encounter (Signed)
Spoke with patient and she verbalized understanding. Scheduled her for 3 month f/u with Dr End. She will let us know if any new issues arise between now and then.

## 2020-04-24 ENCOUNTER — Ambulatory Visit: Payer: BC Managed Care – PPO | Admitting: Internal Medicine

## 2020-04-25 ENCOUNTER — Ambulatory Visit
Admission: RE | Admit: 2020-04-25 | Discharge: 2020-04-25 | Disposition: A | Discharge status: Home / Self Care | Payer: 59 | Source: Ambulatory Visit | Attending: Family Medicine | Admitting: Family Medicine

## 2020-04-25 DIAGNOSIS — R109 Unspecified abdominal pain: Secondary | ICD-10-CM

## 2020-05-31 ENCOUNTER — Other Ambulatory Visit: Payer: Self-pay

## 2020-05-31 ENCOUNTER — Encounter: Payer: Self-pay | Admitting: Primary Care

## 2020-05-31 ENCOUNTER — Ambulatory Visit (INDEPENDENT_AMBULATORY_CARE_PROVIDER_SITE_OTHER): Payer: 59 | Admitting: Primary Care

## 2020-05-31 VITALS — BP 138/86 | HR 82 | Temp 98.3°F | Ht 65.0 in | Wt 361.4 lb

## 2020-05-31 DIAGNOSIS — Z9989 Dependence on other enabling machines and devices: Secondary | ICD-10-CM

## 2020-05-31 DIAGNOSIS — G4730 Sleep apnea, unspecified: Secondary | ICD-10-CM

## 2020-05-31 DIAGNOSIS — E669 Obesity, unspecified: Secondary | ICD-10-CM

## 2020-05-31 DIAGNOSIS — G4733 Obstructive sleep apnea (adult) (pediatric): Secondary | ICD-10-CM

## 2020-05-31 NOTE — Progress Notes (Signed)
@Patient  ID: [name], female    DOB: [birth year], 59 y.o.   MRN: [id number]  Chief Complaint  Patient presents with  . Follow-up    pt needs to decrease humifder setting currently on 18 cm    Referring provider: Hayden Rasmussen, MD  HPI: 59 year old female, former smoker quit 1985. Past medical history significant for obstructive sleep apnea on CPAP. Patient of Dr. Halford Chessman, last seen 01/10/2019.   05/31/2020 Presents today for regular follow-up for OSA. She was last seen a year and a half ago. She is compliant with CPAP use. Current pressure settings auto titrate 10-18 cm H2O.  Feels pressure settings are too high.  She uses a full face mask. Sleeping well, wakes up at night d/t "flubbing" or air leaking of her mask. She wakes up around the same time each night. She lost her mother in 2019, she did not attend grief counseling but has come to terms with acceptance of her loss in a healthy way. She is interested in losing weigh, plans on trying whole 30 and receives blue apron meals.   Airview download 04/29/2020-05/28/2020: 30-30 days used; 100 % > than 4 hours Average use 8 hours 34 minutes Min pressure 10 cm H2O; Max pressure 18 cm H2O Median air leaks 0.5 L/min AHI 0.3  Allergies  Allergen Reactions  . Erythromycin Diarrhea    Immunization History  Administered Date(s) Administered  . Influenza Split 09/22/2018  . Influenza-Unspecified 10/01/2019  . PFIZER SARS-COV-2 Vaccination 03/08/2020, 03/29/2020  . Tdap 10/18/2008    Past Medical History:  Diagnosis Date  . Asthma   . Atrial fibrillation ([place]) 06/2014  . Breast cancer ([place])   . GE reflux   . HTN (hypertension)   . Type II or unspecified type diabetes mellitus without mention of complication, not stated as uncontrolled     Tobacco History: Social History   Tobacco Use  Smoking Status Former Smoker  . Packs/day: 0.30  . Years: 4.00  . Pack years: 1.20  . Types: Cigarettes  . Quit date: 12/22/1983  .  Years since quitting: 36.4  Smokeless Tobacco Never Used   Counseling given: Not Answered   Outpatient Medications Prior to Visit  Medication Sig Dispense Refill  . albuterol (PROVENTIL HFA;VENTOLIN HFA) 108 (90 Base) MCG/ACT inhaler Inhale 2 puffs into the lungs every 6 (six) hours as needed for wheezing or shortness of breath. 1 Inhaler 2  . apixaban (ELIQUIS) 5 MG TABS tablet Take 5 mg by mouth 2 (two) times daily.    Marland Kitchen buPROPion (WELLBUTRIN XL) 300 MG 24 hr tablet Take 300 mg by mouth daily.    . cholecalciferol (VITAMIN D3) 25 MCG (1000 UT) tablet Take 1,000 Units by mouth daily.    . DULoxetine (CYMBALTA) 20 MG capsule Take 20 mg by mouth daily.    . ferrous sulfate 325 (65 FE) MG tablet Take 1 tablet (325 mg total) by mouth daily. (Patient taking differently: Take 325 mg by mouth as directed. Taking 2-3 times per week) 30 tablet 0  . fexofenadine (ALLEGRA) 180 MG tablet Take 180 mg by mouth daily.    . flecainide (TAMBOCOR) 50 MG tablet Take 1 tablet (50 mg total) by mouth 2 (two) times daily. 180 tablet 3  . fluticasone (FLONASE) 50 MCG/ACT nasal spray Place 1 spray into both nostrils daily.    . furosemide (LASIX) 40 MG tablet Take 40 mg by mouth.    . lamoTRIgine (LAMICTAL) 25 MG tablet TAKE ONE TABLET  BY MOUTH DAILY FOR TWO WEEKS AND THEN INCREASE TO ONE TABLET TWICE A DAY    . levothyroxine (SYNTHROID) 50 MCG tablet Take 50 mcg by mouth daily before breakfast.    . lisinopril (PRINIVIL,ZESTRIL) 40 MG tablet Take 40 mg by mouth daily.    . metFORMIN (GLUMETZA) 500 MG (MOD) 24 hr tablet Take 1,000 mg by mouth 2 (two) times daily with a meal.     . montelukast (SINGULAIR) 10 MG tablet Take 10 mg by mouth at bedtime.    Marland Kitchen omeprazole (PRILOSEC) 20 MG capsule Take 20 mg by mouth 2 (two) times daily before a meal.    . verapamil (VERELAN PM) 120 MG 24 hr capsule Take 1 capsule (120 mg total) by mouth at bedtime. 90 capsule 2   No facility-administered medications prior to visit.     Review of Systems  Review of Systems  Constitutional: Negative.   Respiratory: Negative.   Psychiatric/Behavioral: Negative for sleep disturbance.   Physical Exam  BP 138/86 (BP Location: Left Arm, Cuff Size: Normal)   Pulse 82   Temp 98.3 F (36.8 C) (Oral)   Ht 5\' 5"  (1.651 m)   Wt (!) 361 lb 6.4 oz (163.9 kg)   SpO2 93%   BMI 60.14 kg/m  Physical Exam Constitutional:      Appearance: Normal appearance. She is obese.  HENT:     Mouth/Throat:     Mouth: Mucous membranes are moist.     Pharynx: Oropharynx is clear.     Comments: Mallampati class II Cardiovascular:     Rate and Rhythm: Normal rate.     Comments: Trace-+1 BLE edema; wearing compression stockings  Pulmonary:     Effort: Pulmonary effort is normal.     Breath sounds: Normal breath sounds.  Skin:    General: Skin is warm and dry.  Neurological:     General: No focal deficit present.     Mental Status: She is alert and oriented to person, place, and time. Mental status is at baseline.  Psychiatric:        Mood and Affect: Mood normal.        Behavior: Behavior normal.        Thought Content: Thought content normal.        Judgment: Judgment normal.      Lab Results:  CBC    Component Value Date/Time   WBC 10.9 (H) 03/26/2020 0025   RBC 4.29 03/26/2020 0025   HGB 11.9 (L) 03/26/2020 0025   HGB 14.2 10/01/2011 1018   HCT 38.7 03/26/2020 0025   HCT 40.7 10/01/2011 1018   PLT 330 03/26/2020 0025   PLT 297 10/01/2011 1018   MCV 90.2 03/26/2020 0025   MCV 88.3 10/01/2011 1018   MCH 27.7 03/26/2020 0025   MCHC 30.7 03/26/2020 0025   RDW 14.6 03/26/2020 0025   RDW 12.7 10/01/2011 1018   LYMPHSABS 1.9 10/26/2014 0351   LYMPHSABS 2.3 10/01/2011 1018   MONOABS 0.8 10/26/2014 0351   MONOABS 0.8 10/01/2011 1018   EOSABS 0.1 10/26/2014 0351   EOSABS 0.2 10/01/2011 1018   BASOSABS 0.0 10/26/2014 0351   BASOSABS 0.1 10/01/2011 1018    BMET    Component Value Date/Time   NA 139 03/26/2020 0025    K 4.1 03/26/2020 0025   CL 97 (L) 03/26/2020 0025   CO2 28 03/26/2020 0025   GLUCOSE 139 (H) 03/26/2020 0025   BUN 15 03/26/2020 0025   CREATININE 0.79 03/26/2020 0025  CALCIUM 9.4 03/26/2020 0025   GFRNONAA >60 03/26/2020 0025   GFRAA >60 03/26/2020 0025    BNP No results found for: BNP  ProBNP No results found for: PROBNP  Imaging: No results found.   Assessment & Plan:   Sleep apnea - Patient is 100% compliant with use, feels pressure is too high  - Change CPAP pressure setting to 8-13 cm H2O - Continue CPAP every night 4-6 hours - Do not drive if experiencing excessive daytime fatigue - FU in 1 month to review airview after pressure setting change  Morbid obesity ([place]) - Encourage weight loss, discussed possible nutrition consult or referral to healthy weight and wellness - She is interested in losing weight and is starting the whole [place], NP 06/03/2020

## 2020-05-31 NOTE — Patient Instructions (Addendum)
Sleep apnea: Continue CPAP every night 4-6 hours Do not drive if experiencing excessive daytime fatigue  BMI 60: Consider nutrition consult or healthy/weight and wellness- let us know if you would like a referral to either and we can place this  Orders: Change CPAP pressure setting to 8-13 cm H2O  Follow-up: 1 month televisit with airview download   Sleep Apnea Sleep apnea is a condition in which breathing pauses or becomes shallow during sleep. Episodes of sleep apnea usually last 10 seconds or longer, and they may occur as many as 20 times an hour. Sleep apnea disrupts your sleep and keeps your body from getting the rest that it needs. This condition can increase your risk of certain health problems, including:  Heart attack.  Stroke.  Obesity.  Diabetes.  Heart failure.  Irregular heartbeat. What are the causes? There are three kinds of sleep apnea:  Obstructive sleep apnea. This kind is caused by a blocked or collapsed airway.  Central sleep apnea. This kind happens when the part of the brain that controls breathing does not send the correct signals to the muscles that control breathing.  Mixed sleep apnea. This is a combination of obstructive and central sleep apnea. The most common cause of this condition is a collapsed or blocked airway. An airway can collapse or become blocked if:  Your throat muscles are abnormally relaxed.  Your tongue and tonsils are larger than normal.  You are overweight.  Your airway is smaller than normal. What increases the risk? You are more likely to develop this condition if you:  Are overweight.  Smoke.  Have a smaller than normal airway.  Are elderly.  Are female.  Drink alcohol.  Take sedatives or tranquilizers.  Have a family history of sleep apnea. What are the signs or symptoms? Symptoms of this condition include:  Trouble staying asleep.  Daytime sleepiness and tiredness.  Irritability.  Loud  snoring.  Morning headaches.  Trouble concentrating.  Forgetfulness.  Decreased interest in sex.  Unexplained sleepiness.  Mood swings.  Personality changes.  Feelings of depression.  Waking up often during the night to urinate.  Dry mouth.  Sore throat. How is this diagnosed? This condition may be diagnosed with:  A medical history.  A physical exam.  A series of tests that are done while you are sleeping (sleep study). These tests are usually done in a sleep lab, but they may also be done at home. How is this treated? Treatment for this condition aims to restore normal breathing and to ease symptoms during sleep. It may involve managing health issues that can affect breathing, such as high blood pressure or obesity. Treatment may include:  Sleeping on your side.  Using a decongestant if you have nasal congestion.  Avoiding the use of depressants, including alcohol, sedatives, and narcotics.  Losing weight if you are overweight.  Making changes to your diet.  Quitting smoking.  Using a device to open your airway while you sleep, such as: ? An oral appliance. This is a custom-made mouthpiece that shifts your lower jaw forward. ? A continuous positive airway pressure (CPAP) device. This device blows air through a mask when you breathe out (exhale). ? A nasal expiratory positive airway pressure (EPAP) device. This device has valves that you put into each nostril. ? A bi-level positive airway pressure (BPAP) device. This device blows air through a mask when you breathe in (inhale) and breathe out (exhale).  Having surgery if other treatments do not work.  During surgery, excess tissue is removed to create a wider airway. It is important to get treatment for sleep apnea. Without treatment, this condition can lead to:  High blood pressure.  Coronary artery disease.  In men, an inability to achieve or maintain an erection (impotence).  Reduced thinking  abilities. Follow these instructions at home: Lifestyle  Make any lifestyle changes that your health care provider recommends.  Eat a healthy, well-balanced diet.  Take steps to lose weight if you are overweight.  Avoid using depressants, including alcohol, sedatives, and narcotics.  Do not use any products that contain nicotine or tobacco, such as cigarettes, e-cigarettes, and chewing tobacco. If you need help quitting, ask your health care provider. General instructions  Take over-the-counter and prescription medicines only as told by your health care provider.  If you were given a device to open your airway while you sleep, use it only as told by your health care provider.  If you are having surgery, make sure to tell your health care provider you have sleep apnea. You may need to bring your device with you.  Keep all follow-up visits as told by your health care provider. This is important. Contact a health care provider if:  The device that you received to open your airway during sleep is uncomfortable or does not seem to be working.  Your symptoms do not improve.  Your symptoms get worse. Get help right away if:  You develop: ? Chest pain. ? Shortness of breath. ? Discomfort in your back, arms, or stomach.  You have: ? Trouble speaking. ? Weakness on one side of your body. ? Drooping in your face. These symptoms may represent a serious problem that is an emergency. Do not wait to see if the symptoms will go away. Get medical help right away. Call your local emergency services (911 in the [place] Do not drive yourself to the hospital. Summary  Sleep apnea is a condition in which breathing pauses or becomes shallow during sleep.  The most common cause is a collapsed or blocked airway.  The goal of treatment is to restore normal breathing and to ease symptoms during sleep. This information is not intended to replace advice given to you by your health care provider.  Make sure you discuss any questions you have with your health care provider. Document Revised: 05/24/2019 Document Reviewed: 08/02/2018 Elsevier Patient Education  [place].   Obesity, Adult Obesity is the condition of having too much total body fat. Being overweight or obese means that your weight is greater than what is considered healthy for your body size. Obesity is determined by a measurement called BMI. BMI is an estimate of body fat and is calculated from height and weight. For adults, a BMI of 30 or higher is considered obese. Obesity can lead to other health concerns and major illnesses, including:  Stroke.  Coronary artery disease (CAD).  Type 2 diabetes.  Some types of cancer, including cancers of the colon, breast, uterus, and gallbladder.  Osteoarthritis.  High blood pressure (hypertension).  High cholesterol.  Sleep apnea.  Gallbladder stones.  Infertility problems. What are the causes? Common causes of this condition include:  Eating daily meals that are high in calories, sugar, and fat.  Being born with genes that may make you more likely to become obese.  Having a medical condition that causes obesity, including: ? Hypothyroidism. ? Polycystic ovarian syndrome (PCOS). ? Binge-eating disorder. ? Cushing syndrome.  Taking certain medicines, such as steroids,  antidepressants, and seizure medicines.  Not being physically active (sedentary lifestyle).  Not getting enough sleep.  Drinking high amounts of sugar-sweetened beverages, such as soft drinks. What increases the risk? The following factors may make you more likely to develop this condition:  Having a family history of obesity.  Being a woman of African American descent.  Being a man of Hispanic descent.  Living in an area with limited access to: ? [place], recreation centers, or sidewalks. ? Healthy food choices, such as grocery stores and farmers' markets. What are the signs or  symptoms? The main sign of this condition is having too much body fat. How is this diagnosed? This condition is diagnosed based on:  Your BMI. If you are an adult with a BMI of 30 or higher, you are considered obese.  Your waist circumference. This measures the distance around your waistline.  Your skinfold thickness. Your health care provider may gently pinch a fold of your skin and measure it. You may have other tests to check for underlying conditions. How is this treated? Treatment for this condition often includes changing your lifestyle. Treatment may include some or all of the following:  Dietary changes. This may include developing a healthy meal plan.  Regular physical activity. This may include activity that causes your heart to beat faster (aerobic exercise) and strength training. Work with your health care provider to design an exercise program that works for you.  Medicine to help you lose weight if you are unable to lose 1 pound a week after 6 weeks of healthy eating and more physical activity.  Treating conditions that cause the obesity (underlying conditions).  Surgery. Surgical options may include gastric banding and gastric bypass. Surgery may be done if: ? Other treatments have not helped to improve your condition. ? You have a BMI of 40 or higher. ? You have life-threatening health problems related to obesity. Follow these instructions at home: Eating and drinking   Follow recommendations from your health care provider about what you eat and drink. Your health care provider may advise you to: ? Limit fast food, sweets, and processed snack foods. ? Choose low-fat options, such as low-fat milk instead of whole milk. ? Eat 5 or more servings of fruits or vegetables every day. ? Eat at home more often. This gives you more control over what you eat. ? Choose healthy foods when you eat out. ? Learn to read food labels. This will help you understand how much food is  considered 1 serving. ? Learn what a healthy serving size is. ? Keep low-fat snacks available. ? Limit sugary drinks, such as soda, fruit juice, sweetened iced tea, and flavored milk.  Drink enough water to keep your urine pale yellow.  Do not follow a fad diet. Fad diets can be unhealthy and even dangerous. Physical activity  Exercise regularly, as told by your health care provider. ? Most adults should get up to 150 minutes of moderate-intensity exercise every week. ? Ask your health care provider what types of exercise are safe for you and how often you should exercise.  Warm up and stretch before being active.  Cool down and stretch after being active.  Rest between periods of activity. Lifestyle  Work with your health care provider and a dietitian to set a weight-loss goal that is healthy and reasonable for you.  Limit your screen time.  Find ways to reward yourself that do not involve food.  Do not drink alcohol if: ?  Your health care provider tells you not to drink. ? You are pregnant, may be pregnant, or are planning to become pregnant.  If you drink alcohol: ? Limit how much you use to:  0-1 drink a day for women.  0-2 drinks a day for men. ? Be aware of how much alcohol is in your drink. In the [place] one drink equals one 12 oz bottle of beer (355 mL), one 5 oz glass of wine (148 mL), or one 1 oz glass of hard liquor (44 mL). General instructions  Keep a weight-loss journal to keep track of the food you eat and how much exercise you get.  Take over-the-counter and prescription medicines only as told by your health care provider.  Take vitamins and supplements only as told by your health care provider.  Consider joining a support group. Your health care provider may be able to recommend a support group.  Keep all follow-up visits as told by your health care provider. This is important. Contact a health care provider if:  You are unable to meet your weight  loss goal after 6 weeks of dietary and lifestyle changes. Get help right away if you are having:  Trouble breathing.  Suicidal thoughts or behaviors. Summary  Obesity is the condition of having too much total body fat.  Being overweight or obese means that your weight is greater than what is considered healthy for your body size.  Work with your health care provider and a dietitian to set a weight-loss goal that is healthy and reasonable for you.  Exercise regularly, as told by your health care provider. Ask your health care provider what types of exercise are safe for you and how often you should exercise. This information is not intended to replace advice given to you by your health care provider. Make sure you discuss any questions you have with your health care provider. Document Revised: 08/11/2018 Document Reviewed: 08/11/2018 Elsevier Patient Education  2020 [place] [employer] Calories are units of energy. Your body needs a certain amount of calories from food to keep you going throughout the day. When you eat more calories than your body needs, your body stores the extra calories as fat. When you eat fewer calories than your body needs, your body burns fat to get the energy it needs. Calorie counting means keeping track of how many calories you eat and drink each day. Calorie counting can be helpful if you need to lose weight. If you make sure to eat fewer calories than your body needs, you should lose weight. Ask your health care provider what a healthy weight is for you. For calorie counting to work, you will need to eat the right number of calories in a day in order to lose a healthy amount of weight per week. A dietitian can help you determine how many calories you need in a day and will give you suggestions on how to reach your calorie goal.  A healthy amount of weight to lose per week is usually 1-2 lb (0.5-0.9 kg). This usually means that your  daily calorie intake should be reduced by 500-750 calories.  Eating 1,200 - 1,500 calories per day can help most women lose weight.  Eating 1,500 - 1,800 calories per day can help most men lose weight. What is my plan? My goal is to have __________ calories per day. If I have this many calories per day, I should lose around __________ pounds per  week. What do I need to know about calorie counting? In order to meet your daily calorie goal, you will need to:  Find out how many calories are in each food you would like to eat. Try to do this before you eat.  Decide how much of the food you plan to eat.  Write down what you ate and how many calories it had. Doing this is called keeping a food log. To successfully lose weight, it is important to balance calorie counting with a healthy lifestyle that includes regular activity. Aim for 150 minutes of moderate exercise (such as walking) or 75 minutes of vigorous exercise (such as running) each week. Where do I find calorie information?  The number of calories in a food can be found on a Nutrition Facts label. If a food does not have a Nutrition Facts label, try to look up the calories online or ask your dietitian for help. Remember that calories are listed per serving. If you choose to have more than one serving of a food, you will have to multiply the calories per serving by the amount of servings you plan to eat. For example, the label on a package of bread might say that a serving size is 1 slice and that there are 90 calories in a serving. If you eat 1 slice, you will have eaten 90 calories. If you eat 2 slices, you will have eaten 180 calories. How do I keep a food log? Immediately after each meal, record the following information in your food log:  What you ate. Don't forget to include toppings, sauces, and other extras on the food.  How much you ate. This can be measured in cups, ounces, or number of items.  How many calories each food and  drink had.  The total number of calories in the meal. Keep your food log near you, such as in a small notebook in your pocket, or use a mobile app or website. Some programs will calculate calories for you and show you how many calories you have left for the day to meet your goal. What are some calorie counting tips?   Use your calories on foods and drinks that will fill you up and not leave you hungry: ? Some examples of foods that fill you up are nuts and nut butters, vegetables, lean proteins, and high-fiber foods like whole grains. High-fiber foods are foods with more than 5 g fiber per serving. ? Drinks such as sodas, specialty coffee drinks, alcohol, and juices have a lot of calories, yet do not fill you up.  Eat nutritious foods and avoid empty calories. Empty calories are calories you get from foods or beverages that do not have many vitamins or protein, such as candy, sweets, and soda. It is better to have a nutritious high-calorie food (such as an avocado) than a food with few nutrients (such as a bag of chips).  Know how many calories are in the foods you eat most often. This will help you calculate calorie counts faster.  Pay attention to calories in drinks. Low-calorie drinks include water and unsweetened drinks.  Pay attention to nutrition labels for "low fat" or "fat free" foods. These foods sometimes have the same amount of calories or more calories than the full fat versions. They also often have added sugar, starch, or salt, to make up for flavor that was removed with the fat.  Find a way of tracking calories that works for you. Get creative. Try different  apps or programs if writing down calories does not work for you. What are some portion control tips?  Know how many calories are in a serving. This will help you know how many servings of a certain food you can have.  Use a measuring cup to measure serving sizes. You could also try weighing out portions on a kitchen scale.  With time, you will be able to estimate serving sizes for some foods.  Take some time to put servings of different foods on your favorite plates, bowls, and cups so you know what a serving looks like.  Try not to eat straight from a bag or box. Doing this can lead to overeating. Put the amount you would like to eat in a cup or on a plate to make sure you are eating the right portion.  Use smaller plates, glasses, and bowls to prevent overeating.  Try not to multitask (for example, watch TV or use your computer) while eating. If it is time to eat, sit down at a table and enjoy your food. This will help you to know when you are full. It will also help you to be aware of what you are eating and how much you are eating. What are tips for following this plan? Reading food labels  Check the calorie count compared to the serving size. The serving size may be smaller than what you are used to eating.  Check the source of the calories. Make sure the food you are eating is high in vitamins and protein and low in saturated and trans fats. Shopping  Read nutrition labels while you shop. This will help you make healthy decisions before you decide to purchase your food.  Make a grocery list and stick to it. Cooking  Try to cook your favorite foods in a healthier way. For example, try baking instead of frying.  Use low-fat dairy products. Meal planning  Use more fruits and vegetables. Half of your plate should be fruits and vegetables.  Include lean proteins like poultry and fish. How do I count calories when eating out?  Ask for smaller portion sizes.  Consider sharing an entree and sides instead of getting your own entree.  If you get your own entree, eat only half. Ask for a box at the beginning of your meal and put the rest of your entree in it so you are not tempted to eat it.  If calories are listed on the menu, choose the lower calorie options.  Choose dishes that include vegetables,  fruits, whole grains, low-fat dairy products, and lean protein.  Choose items that are boiled, broiled, grilled, or steamed. Stay away from items that are buttered, battered, fried, or served with cream sauce. Items labeled "crispy" are usually fried, unless stated otherwise.  Choose water, low-fat milk, unsweetened iced tea, or other drinks without added sugar. If you want an alcoholic beverage, choose a lower calorie option such as a glass of wine or light beer.  Ask for dressings, sauces, and syrups on the side. These are usually high in calories, so you should limit the amount you eat.  If you want a salad, choose a garden salad and ask for grilled meats. Avoid extra toppings like bacon, cheese, or fried items. Ask for the dressing on the side, or ask for olive oil and vinegar or lemon to use as dressing.  Estimate how many servings of a food you are given. For example, a serving of cooked rice is  cup  or about the size of half a baseball. Knowing serving sizes will help you be aware of how much food you are eating at restaurants. The list below tells you how big or small some common portion sizes are based on everyday objects: ? 1 oz--4 stacked dice. ? 3 oz--1 deck of cards. ? 1 tsp--1 die. ? 1 Tbsp-- a ping-pong ball. ? 2 Tbsp--1 ping-pong ball. ?  cup-- baseball. ? 1 cup--1 baseball. Summary  Calorie counting means keeping track of how many calories you eat and drink each day. If you eat fewer calories than your body needs, you should lose weight.  A healthy amount of weight to lose per week is usually 1-2 lb (0.5-0.9 kg). This usually means reducing your daily calorie intake by 500-750 calories.  The number of calories in a food can be found on a Nutrition Facts label. If a food does not have a Nutrition Facts label, try to look up the calories online or ask your dietitian for help.  Use your calories on foods and drinks that will fill you up, and not on foods and drinks that  will leave you hungry.  Use smaller plates, glasses, and bowls to prevent overeating. This information is not intended to replace advice given to you by your health care provider. Make sure you discuss any questions you have with your health care provider. Document Revised: 08/26/2018 Document Reviewed: 11/06/2016 Elsevier Patient Education  [place].

## 2020-06-03 ENCOUNTER — Encounter: Payer: Self-pay | Admitting: Primary Care

## 2020-06-03 DIAGNOSIS — G4730 Sleep apnea, unspecified: Secondary | ICD-10-CM | POA: Insufficient documentation

## 2020-06-03 DIAGNOSIS — G4733 Obstructive sleep apnea (adult) (pediatric): Secondary | ICD-10-CM | POA: Insufficient documentation

## 2020-06-03 HISTORY — DX: Sleep apnea, unspecified: G47.30

## 2020-06-03 NOTE — Assessment & Plan Note (Addendum)
-   Patient is 100% compliant with use, feels pressure is too high  - Change CPAP pressure setting to 8-13 cm H2O - Continue CPAP every night 4-6 hours - Do not drive if experiencing excessive daytime fatigue - FU in 1 month to review airview after pressure setting change

## 2020-06-03 NOTE — Progress Notes (Signed)
Reviewed and agree with assessment/plan.   Chesley Mires, MD [facility] Pulmonary/Critical Care 06/03/2020, 9:23 AM Pager:  [phone]

## 2020-06-03 NOTE — Assessment & Plan Note (Signed)
-   Encourage weight loss, discussed possible nutrition consult or referral to healthy weight and wellness - She is interested in losing weight and is starting the whole 30

## 2020-06-07 ENCOUNTER — Telehealth: Payer: Self-pay | Admitting: Internal Medicine

## 2020-06-07 ENCOUNTER — Telehealth: Payer: Self-pay

## 2020-06-07 MED ORDER — VERAPAMIL HCL ER 120 MG PO CP24: 120.0000 mg | ORAL_CAPSULE | Freq: Two times a day (BID) | ORAL | 0 refills | Status: DC

## 2020-06-07 NOTE — Telephone Encounter (Signed)
*  STAT* If patient is at the pharmacy, call can be transferred to refill team.   1. Which medications need to be refilled? (please list name of each medication and dose if known) verapamil 120 mg BID  2. Which pharmacy/location (including street and city if local pharmacy) is medication to be sent to? [facility] [address] [place]   3. Do they need a 30 day or 90 day supply? [place]

## 2020-06-07 NOTE — Telephone Encounter (Signed)
Called patient back.  Spoke with patient.   Patient said at her last office visit here it was discussed that her Verapamil would be increased if her BP stayed Elevated.  When she went to her PCP for an appointment about a month later her BP was still elevated.  Her PCP went ahead and increased verapamil 120 to BID.

## 2020-06-07 NOTE — Telephone Encounter (Signed)
Called patient.  No answer, LMOV.

## 2020-06-07 NOTE — Telephone Encounter (Signed)
Patient returning call from brittany re refill.

## 2020-06-07 NOTE — Telephone Encounter (Signed)
Called patient back. Please see other encounter.

## 2020-07-01 ENCOUNTER — Encounter: Payer: Self-pay | Admitting: Primary Care

## 2020-07-01 ENCOUNTER — Ambulatory Visit (INDEPENDENT_AMBULATORY_CARE_PROVIDER_SITE_OTHER): Payer: 59 | Admitting: Primary Care

## 2020-07-01 ENCOUNTER — Other Ambulatory Visit: Payer: Self-pay

## 2020-07-01 DIAGNOSIS — G4733 Obstructive sleep apnea (adult) (pediatric): Secondary | ICD-10-CM

## 2020-07-01 NOTE — Progress Notes (Signed)
Virtual Visit via Video Note  I connected with [name] on 07/01/20 at  1:30 PM EDT by a video enabled telemedicine application and verified that I am speaking with the correct person using two identifiers.  Location: Patient: Home Provider: Office   I discussed the limitations of evaluation and management by telemedicine and the availability of in person appointments. The patient expressed understanding and agreed to proceed.  History of Present Illness:  59 year old female, former smoker quit 1985. Past medical history significant for obstructive sleep apnea on CPAP. Patient of Dr. Halford Chessman, last seen 01/10/2019.   05/31/2020 Presents today for regular follow-up for OSA. She was last seen a year and a half ago. She is compliant with CPAP use. Current pressure settings auto titrate 10-18 cm H2O.  Feels pressure settings are too high.  She uses a full face mask. Sleeping well, wakes up at night d/t "flubbing" or air leaking of her mask. She wakes up around the same time each night. She lost her mother in 2019, she did not attend grief counseling but has come to terms with acceptance of her loss in a healthy way. She is interested in losing weigh, plans on trying whole 30 and receives blue apron meals.   Airview download 04/29/2020-05/28/2020: 30-30 days used; 100 % > than 4 hours. Average use 8 hours 34 minutes. Pressure 10-18cm H2O. Median air leaks 0.5 L/min. AHI 0.3  07/01/2020 Patient contacted today for 1 month follow-up CPAP following pressure change. During last visit she felt like her pressure was too high for her. Sleeping much longer, doing well with new pressure setting. She reports less flubbing with her CPAP mask. She is currently with [facility], asking to switched DME companies. She paid off her remaining balance for her CPAP machine which she now owns. States that she cant get a hold of any one there and that its been a very frustrating experience. She would like to change to [facility] # [phone]. Needs provider notes, most recent sleep study and order to transfer care. [facility] 6.   Airview download 05/28/20- 06/26/20:  Usage 30/30 days (100%); 30 days (100%) > 4 hours Average usage 8 hours 28 mins Pressure 8-13cm h20 (13.2cm h20- 95%, 13.7 maximum) Airleaks 0.8 (median); 13.2L/min (95%) AHI 0.3   Observations/Objective:  - Appears well, no overt shortness of breath or wheezing  Assessment and Plan:  OSA: - PSG 09/30/18 >> AHI 76, SpO2 low 72% - She is doing well, sleeping uninterrupted at night  - Current pressure 8-13cm h20 (13.2cm h20- 95%); AHI 0.3 - Recommend increasing pressure to 8-14cm h20 d/t her using max pressure 13.7 - Change CPAP providers to [facility] from [place]   Follow Up Instructions:  - Follow up in 6 months with Dr. Halford Chessman   I discussed the assessment and treatment plan with the patient. The patient was provided an opportunity to ask questions and all were answered. The patient agreed with the plan and demonstrated an understanding of the instructions.   The patient was advised to call back or seek an in-person evaluation if the symptoms worsen or if the condition fails to improve as anticipated.  I provided 18 minutes of non-face-to-face time during this encounter.   Martyn Ehrich, NP

## 2020-07-02 NOTE — Progress Notes (Signed)
Reviewed and agree with assessment/plan.   Chesley Mires, MD [facility] Pulmonary/Critical Care 07/02/2020, 8:58 AM Pager:  [phone]

## 2020-07-08 NOTE — Progress Notes (Signed)
07/09/2020 2:57 PM   [name] [birth year] [id number]  Referring provider: Hayden Rasmussen, MD [address] [place],  [place] [address] Chief Complaint  Patient presents with  . Nephrolithiasis    New Patient    HPI: [name] is a 59 y.o. female who presents today for evaluation and management for nephrolithiasis.    The patient has a personal history of RUQ pain that radiates to the right side of her back. She has a personal history of nephrolithiasis and hematuria.   CT A/P on 04/25/2020 revealed cholelithiasis. Nonobstructing 6 mm calculus inferior pole LEFT kidney. Tiny umbilical hernia containing fat. No additional intra-abdominal or intrapelvic abnormalities.  The patient saw her PCP on 06/17/2020 for a review of CT scan. She had left flank pain and was concerned she may have a kidney stone. She had no hematuria x 3 weeks.    She has had hematuria on and off. Always associated with flank pain. She reports seeing small clots along with blood in her urine. She denies vaginal bleeding.   She has lower and upper back pain. She has right sided pain the has be persistent since 04/25/2020.   She used to smoked socially as a teen and young adult.  She has a history of anxiety and depression, on lamotrigine.   PMH: Past Medical History:  Diagnosis Date  . Anxiety 08/23/2014  . Asthma   . Atrial fibrillation ([place]) 06/2014  . Breast cancer ([place])   . Chronic heart failure with preserved ejection fraction (HFpEF) ([place]) 04/21/2019  . Cough 09/06/2018  . Current moderate episode of major depressive disorder without prior episode ([place]) 12/31/2017  . Dependent edema 10/05/2011  . Dyspnea and respiratory abnormality 09/06/2018  . GE reflux   . GERD (gastroesophageal reflux disease) 12/22/1999   Formatting of this note might be different from the original. Controlled by OTC meds  . History of breast cancer 10/05/2011  . HTN (hypertension)   . Hypertension  10/05/2011  . Long term current use of anticoagulant therapy 08/29/2014  . Mild persistent asthma without complication 12/21/7508  . Morbid obesity ([place]) 10/05/2011  . Morbid obesity with BMI of 60.0-69.9, adult ([place]) 09/06/2018  . Obstructive sleep apnea, adult 09/06/2018  . Paroxysmal atrial fibrillation ([place]) 04/21/2019  . Sleep apnea 06/03/2020  . 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 Type 2 diabetes mellitus without complication, without long-term current use of insulin ([place]) 04/21/2019  . Type II or unspecified type diabetes mellitus without mention of complication, not stated as uncontrolled   . Urinary bladder incontinence 06/13/2013    Surgical History: Past Surgical History:  Procedure Laterality Date  . CARDIAC CATHETERIZATION  02/16/2018   No stents;[place]. Collene [place]ares[place] [facility]  . FINGER SURGERY Right   . MASTECTOMY Right     Home Medications:  Allergies as of 07/09/2020      Reactions   Erythromycin Diarrhea      Medication List       Accurate as of July 09, 2020  2:57 PM. If you have any questions, ask your nurse or doctor.        albuterol 108 (90 Base) MCG/ACT inhaler Commonly known as: VENTOLIN HFA Inhale 2 puffs into the lungs every 6 (six) hours as needed for wheezing or shortness of breath.   buPROPion 300 MG 24 hr tablet Commonly known as: WELLBUTRIN XL Take 300 mg by mouth daily.   cholecalciferol 25 MCG (1000 UNIT) tablet  Commonly known as: VITAMIN D3 Take 1,000 Units by mouth daily.   DULoxetine 20 MG capsule Commonly known as: CYMBALTA Take 20 mg by mouth daily.   Eliquis 5 MG Tabs tablet Generic drug: apixaban Take 5 mg by mouth 2 (two) times daily.   ferrous sulfate 325 (65 FE) MG tablet Take 1 tablet (325 mg total) by mouth daily. What changed:   when to take this  additional instructions   fexofenadine 180 MG tablet Commonly known as: ALLEGRA Take 180 mg by mouth daily.   flecainide 50 MG tablet Commonly known  as: TAMBOCOR Take 1 tablet (50 mg total) by mouth 2 (two) times daily.   fluticasone 50 MCG/ACT nasal spray Commonly known as: FLONASE Place 1 spray into both nostrils daily.   furosemide 40 MG tablet Commonly known as: LASIX Take 40 mg by mouth.   lamoTRIgine 25 MG tablet Commonly known as: LAMICTAL TAKE ONE TABLET BY MOUTH DAILY FOR TWO WEEKS AND THEN INCREASE TO ONE TABLET TWICE A DAY   levothyroxine 50 MCG tablet Commonly known as: SYNTHROID Take 50 mcg by mouth daily before breakfast.   lisinopril 40 MG tablet Commonly known as: ZESTRIL Take 40 mg by mouth daily.   metFORMIN 500 MG (MOD) 24 hr tablet Commonly known as: GLUMETZA Take 1,000 mg by mouth 2 (two) times daily with a meal.   metFORMIN 500 MG tablet Commonly known as: GLUCOPHAGE Take 1,000 mg by mouth 2 (two) times daily.   montelukast 10 MG tablet Commonly known as: SINGULAIR Take 10 mg by mouth at bedtime.   omeprazole 20 MG capsule Commonly known as: PRILOSEC Take 20 mg by mouth 2 (two) times daily before a meal.   verapamil 120 MG 24 hr capsule Commonly known as: VERELAN PM Take 1 capsule (120 mg total) by mouth 2 (two) times daily.       Allergies:  Allergies  Allergen Reactions  . Erythromycin Diarrhea    Family History: Family History  Problem Relation Age of Onset  . Heart Problems Mother   . Hypertension Mother   . Angina Mother   . Atrial fibrillation Mother   . Heart Problems Father   . Hypertension Father   . Kidney Stones Sister   . Kidney cancer Neg Hx   . Prostate cancer Neg Hx   . Bladder Cancer Neg Hx     Social History:  reports that she quit smoking about 36 years ago. Her smoking use included cigarettes. She has a 1.20 pack-year smoking history. She has never used smokeless tobacco. She reports previous alcohol use. She reports that she does not use drugs.   Physical Exam: BP (!) 155/72   Pulse 94   Ht 5\' 5"  (1.651 m)   Wt (!) 358 lb (162.4 kg)   BMI 59.57  kg/m   Constitutional:  Alert and oriented, No acute distress. HEENT: [place] AT, moist mucus membranes.  Trachea midline, no masses. Cardiovascular: No clubbing, cyanosis, or edema. Respiratory: Normal respiratory effort, no increased work of breathing. GI: Abdomen is soft, nontender, nondistended, obese Skin: No rashes, bruises or suspicious lesions. Neurologic: Grossly intact, no focal deficits, moving all 4 extremities. Psychiatric: Normal mood and affect.  Laboratory Data:  Lab Results  Component Value Date   CREATININE 0.79 03/26/2020   Urinalysis Results for orders placed or performed in visit on 07/09/20  Microscopic Examination   Urine  Result Value Ref Range   WBC, UA 0-5 0 - 5 /hpf   RBC  None seen 0 - 2 /hpf   Epithelial Cells (non renal) 0-10 0 - 10 /hpf   Bacteria, UA Moderate (A) None seen/Few  Urinalysis, Complete  Result Value Ref Range   Specific Gravity, UA 1.020 1.005 - 1.030   pH, UA 8.5 (H) 5.0 - 7.5   Color, UA Yellow Yellow   Appearance Ur Cloudy (A) Clear   Leukocytes,UA Negative Negative   Protein,UA 1+ (A) Negative/Trace   Glucose, UA Negative Negative   Ketones, UA Negative Negative   RBC, UA Negative Negative   Bilirubin, UA Negative Negative   Urobilinogen, Ur 1.0 0.2 - 1.0 mg/dL   Nitrite, UA Negative Negative   Microscopic Examination See below:      Pertinent Imaging:  CLINICAL DATA:  RIGHT flank and groin pain for 1 week, hematuria, history of kidney stones, past history RIGHT breast cancer post mastectomy in 2001, chemotherapy, and radiation therapy  EXAM: CT ABDOMEN AND PELVIS WITHOUT CONTRAST  TECHNIQUE: Multidetector CT imaging of the abdomen and pelvis was performed following the standard protocol without IV contrast. Sagittal and coronal MPR images reconstructed from axial data set.  COMPARISON:  None  FINDINGS: Lower chest: Lung bases clear.  Post RIGHT mastectomy.  Hepatobiliary: Multiple calcified gallstones  filled gallbladder. No pericholecystic infiltration. Liver unremarkable.  Pancreas: Atrophic pancreas without mass  Spleen: Normal appearance  Adrenals/Urinary Tract: Small nonobstructing calculus at inferior pole LEFT kidney 6 mm diameter. Adrenal glands, kidneys, ureters, and decompressed bladder otherwise unremarkable.  Stomach/Bowel: Normal appendix. Stomach and bowel loops normal appearance.  Vascular/Lymphatic: Minimal atherosclerotic calcification aorta. Aorta normal caliber. No adenopathy.  Reproductive: Unremarkable uterus and adnexa  Other: No free air or free fluid. No inflammatory process. Tiny umbilical hernia containing fat.  Musculoskeletal: Degenerative disc and facet disease changes at lower lumbar spine.  IMPRESSION: Cholelithiasis.  Nonobstructing 6 mm calculus inferior pole LEFT kidney.  Tiny umbilical hernia containing fat.  No additional intra-abdominal or intrapelvic abnormalities.   Electronically Signed   By: Lavonia Dana M.D.   On: 04/25/2020 16:44   I have personally reviewed the images and agree with radiologist interpretation.    Assessment & Plan:    1. Gross hematuria  UA today without blood  We discussed the differential diagnosis for microscopic hematuria including nephrolithiasis, renal or upper tract tumors, bladder stones, UTIs, or bladder tumors as well as undetermined etiologies. Per AUA guidelines, I did recommend complete microscopic hematuria evaluation including CTU, possible urine cytology, and office cystoscopy.  Recommend cystoscopy first and if bladder is unremarkable will consider CT imaging with contrast (hemturia protocol)  2. Right flank pain Persistent  Always occur with hematuria See #1  3. Left kidney stone Incidental, asymptomatic Recommend surveillance No likely cause of pain or bleeding  F/u cysto  [facility] [address], [place], [place]  [address] [phone]  I, Selena Batten, am acting as a scribe for Dr. Hollice Espy.  I have reviewed the above documentation for accuracy and completeness, and I agree with the above.   Hollice Espy, MD  I spent 45 total minutes on the day of the encounter including pre-visit review of the medical record, face-to-face time with the patient, and post visit ordering of labs/imaging/tests.

## 2020-07-09 ENCOUNTER — Encounter: Payer: Self-pay | Admitting: Urology

## 2020-07-09 ENCOUNTER — Ambulatory Visit (INDEPENDENT_AMBULATORY_CARE_PROVIDER_SITE_OTHER): Payer: 59 | Admitting: Urology

## 2020-07-09 ENCOUNTER — Other Ambulatory Visit: Payer: Self-pay

## 2020-07-09 VITALS — BP 155/72 | HR 94 | Ht 65.0 in | Wt 358.0 lb

## 2020-07-09 DIAGNOSIS — R310 Gross hematuria: Secondary | ICD-10-CM

## 2020-07-09 DIAGNOSIS — R109 Unspecified abdominal pain: Secondary | ICD-10-CM

## 2020-07-09 DIAGNOSIS — N200 Calculus of kidney: Secondary | ICD-10-CM

## 2020-07-09 LAB — URINALYSIS, COMPLETE
Bilirubin, UA: NEGATIVE
Glucose, UA: NEGATIVE
Ketones, UA: NEGATIVE
Leukocytes,UA: NEGATIVE
Nitrite, UA: NEGATIVE
RBC, UA: NEGATIVE
Specific Gravity, UA: 1.02 (ref 1.005–1.030)
Urobilinogen, Ur: 1 mg/dL (ref 0.2–1.0)
pH, UA: 8.5 — ABNORMAL HIGH (ref 5.0–7.5)

## 2020-07-09 LAB — MICROSCOPIC EXAMINATION: RBC, Urine: NONE SEEN /hpf (ref 0–2)

## 2020-07-11 NOTE — Progress Notes (Signed)
   07/12/2020  CC:  Chief Complaint  Patient presents with  . Cysto    HPI: [name] is a 59 y.o. female with gross hematuria, left kidney stone, and right flank pain returns today for a cysto.   The patient has a personal history of RUQ pain that radiates to the right side of her back. She has a personal history of nephrolithiasis and hematuria.   CT A/P on 04/25/2020 revealed cholelithiasis. Nonobstructing 6 mm calculus inferior pole LEFT kidney. Tiny umbilical hernia containing fat. No additional intra-abdominal or intrapelvic abnormalities.  She used to smoked socially as a teen and young adult.  She has a history of anxiety and depression, on lamotrigine.  She is concerned about a bladder infection and right flank pain.   Blood pressure (!) 144/79, pulse 85, height 5\' 5"  (1.651 m), weight (!) 358 lb (162.4 kg). NED. A&Ox3.   No respiratory distress   Abd soft, NT, ND Normal external genitalia with patent urethral meatus  Cystoscopy Procedure Note  Patient identification was confirmed, informed consent was obtained, and patient was prepped using Betadine solution.  Lidocaine jelly was administered per urethral meatus.    Procedure: - Flexible cystoscope introduced, without any difficulty.   - Thorough search of the bladder revealed:    normal urethral meatus    normal urothelium    no stones    no ulcers     no tumors    no urethral polyps    no trabeculation  - Ureteral orifices were normal in position and appearance.  Post-Procedure: - Patient tolerated the procedure well  Assessment/ Plan:  1. Gross hematuria Cysto was unremarkable There was no explanation to explain gross hematuria and right flank pain. -CT urogram will be ordered to rule out any upper tract issues.   2. Right flank pain Persistent Always occur with hematuria See #1  3. Left kidney stone Continue surevillance   I, Selena Batten, am acting as a scribe for Dr. Hollice Espy.  I have reviewed the above documentation for accuracy and completeness, and I agree with the above.   Hollice Espy, MD

## 2020-07-12 ENCOUNTER — Other Ambulatory Visit: Payer: Self-pay

## 2020-07-12 ENCOUNTER — Encounter: Payer: Self-pay | Admitting: Urology

## 2020-07-12 ENCOUNTER — Ambulatory Visit (INDEPENDENT_AMBULATORY_CARE_PROVIDER_SITE_OTHER): Payer: 59 | Admitting: Urology

## 2020-07-12 VITALS — BP 144/79 | HR 85 | Ht 65.0 in | Wt 358.0 lb

## 2020-07-12 DIAGNOSIS — N200 Calculus of kidney: Secondary | ICD-10-CM | POA: Diagnosis not present

## 2020-07-12 DIAGNOSIS — R109 Unspecified abdominal pain: Secondary | ICD-10-CM

## 2020-07-12 DIAGNOSIS — R310 Gross hematuria: Secondary | ICD-10-CM

## 2020-07-26 ENCOUNTER — Ambulatory Visit: Payer: 59 | Admitting: Urology

## 2020-07-26 ENCOUNTER — Encounter: Payer: Self-pay | Admitting: Urology

## 2020-07-30 ENCOUNTER — Other Ambulatory Visit: Payer: Self-pay | Admitting: *Deleted

## 2020-07-30 DIAGNOSIS — N200 Calculus of kidney: Secondary | ICD-10-CM

## 2020-07-31 ENCOUNTER — Ambulatory Visit: Admission: RE | Admit: 2020-07-31 | Payer: 59 | Source: Ambulatory Visit

## 2020-07-31 ENCOUNTER — Other Ambulatory Visit: Payer: Self-pay

## 2020-07-31 ENCOUNTER — Ambulatory Visit
Admission: RE | Admit: 2020-07-31 | Discharge: 2020-07-31 | Disposition: A | Discharge status: Home / Self Care | Payer: 59 | Source: Ambulatory Visit | Attending: Urology | Admitting: Urology

## 2020-07-31 DIAGNOSIS — R109 Unspecified abdominal pain: Secondary | ICD-10-CM | POA: Insufficient documentation

## 2020-07-31 LAB — POCT I-STAT CREATININE: Creatinine, Ser: 0.7 mg/dL (ref 0.44–1.00)

## 2020-07-31 MED ORDER — IOHEXOL 300 MG/ML  SOLN
125.0000 mL | Freq: Once | INTRAMUSCULAR | Status: AC | PRN
  Administered 2020-07-31: 125 mL via INTRAVENOUS

## 2020-08-02 ENCOUNTER — Other Ambulatory Visit: Payer: Self-pay | Admitting: Internal Medicine

## 2020-08-08 ENCOUNTER — Other Ambulatory Visit: Payer: Self-pay

## 2020-08-08 ENCOUNTER — Ambulatory Visit (INDEPENDENT_AMBULATORY_CARE_PROVIDER_SITE_OTHER): Payer: 59 | Admitting: Internal Medicine

## 2020-08-08 ENCOUNTER — Encounter: Payer: Self-pay | Admitting: Internal Medicine

## 2020-08-08 VITALS — BP 124/60 | HR 84 | Ht 65.0 in | Wt 357.1 lb

## 2020-08-08 DIAGNOSIS — I10 Essential (primary) hypertension: Secondary | ICD-10-CM

## 2020-08-08 DIAGNOSIS — I480 Paroxysmal atrial fibrillation: Secondary | ICD-10-CM | POA: Diagnosis not present

## 2020-08-08 DIAGNOSIS — I5032 Chronic diastolic (congestive) heart failure: Secondary | ICD-10-CM | POA: Diagnosis not present

## 2020-08-08 DIAGNOSIS — R310 Gross hematuria: Secondary | ICD-10-CM

## 2020-08-08 NOTE — Progress Notes (Signed)
Follow-up Outpatient Visit Date: 08/08/2020  Primary Care Provider: Hayden Rasmussen, [address] [place] [address]  Chief Complaint: Follow-up atrial fibrillation  HPI:  [name] is a 59 y.o. female with history of chronic HFpEF, paroxysmal atrial fibrillation, OSA, hypertension, type 2 diabetes mellitus, who presents for follow-up of atrial fibrillation and HFpEF.  I last spoke with [name] in November, at which time she was feeling well.  She presented to the [facility] ED in early April with palpitations and diaphoresis.  She initially called 911 and was found to be in afib with RVR when EMS arrived.  She spontaneously converted back to NSR before reaching the ED.  Workup in the ED was unrevealing.  Today, [name] reports that she has been doing well.  She has not had any further palpitations/atrial fibrillation.  She is tolerating her medications well.  She denies chest pain, shortness of breath, lightheadedness, and edema.  Mobility has been limited by a knee injury.  Ms. Gebhart notes intermittent gross hematuria and right flank.  She has undergone cystoscope and CT without clear cause identified.  She is scheduled for f/u with Dr. Erlene Quan (urology) tomorrow.  --------------------------------------------------------------------------------------------------  Cardiovascular History & Procedures: Cardiovascular Problems:  Paroxysmal atrial fibrillation  Chronic diastolic heart failure  Risk Factors:  Hypertension, diabetes mellitus, and morbid obesity  Cath/PCI:  LHC (02/16/2018): Mild luminal irregularities involving the LAD, LCx, and RCA.  LVEDP 34 mmHg.  CV Surgery:  None  EP Procedures and Devices:  None  Non-Invasive Evaluation(s):  Myocardial PET/CT (01/07/2018): Small in size, moderate in severity, reversible anterior defect that may represent ischemia.  Small to moderate in size, moderate in severity, apical lateral and apical  defect that is partially reversible and may represent ischemia.  LVEF 61%.  TTE (11/09/2017): Normal LV size and function.  LVEF 60-65% with normal diastolic function.  Normal RV size and function.  Mitral annular calcification present.  Recent CV Pertinent Labs: Lab Results  Component Value Date   K 4.1 03/26/2020   BUN 15 03/26/2020   CREATININE 0.70 07/31/2020    Past medical and surgical history were reviewed and updated in EPIC.  Current Meds  Medication Sig  . albuterol (PROVENTIL HFA;VENTOLIN HFA) 108 (90 Base) MCG/ACT inhaler Inhale 2 puffs into the lungs every 6 (six) hours as needed for wheezing or shortness of breath.  Marland Kitchen apixaban (ELIQUIS) 5 MG TABS tablet Take 5 mg by mouth 2 (two) times daily.  Marland Kitchen buPROPion (WELLBUTRIN XL) 300 MG 24 hr tablet Take 300 mg by mouth daily.  . cholecalciferol (VITAMIN D3) 25 MCG (1000 UT) tablet Take 1,000 Units by mouth daily.  . DULoxetine (CYMBALTA) 20 MG capsule Take 20 mg by mouth daily.  . ferrous sulfate 325 (65 FE) MG tablet Take 1 tablet (325 mg total) by mouth daily.  . fexofenadine (ALLEGRA) 180 MG tablet Take 180 mg by mouth daily.  . flecainide (TAMBOCOR) 50 MG tablet TAKE 1 TABLET BY MOUTH TWICE A DAY  . fluticasone (FLONASE) 50 MCG/ACT nasal spray Place 1 spray into both nostrils daily.  . furosemide (LASIX) 40 MG tablet Take 40 mg by mouth.  . lamoTRIgine (LAMICTAL) 25 MG tablet TAKE ONE TABLET BY MOUTH DAILY FOR TWO WEEKS AND THEN INCREASE TO ONE TABLET TWICE A DAY  . levothyroxine (SYNTHROID) 50 MCG tablet Take 50 mcg by mouth daily before breakfast.  . lisinopril (PRINIVIL,ZESTRIL) 40 MG tablet Take 40 mg by mouth daily.  Marland Kitchen  metFORMIN (GLUCOPHAGE) 500 MG tablet Take 1,000 mg by mouth 2 (two) times daily.  . metFORMIN (GLUMETZA) 500 MG (MOD) 24 hr tablet Take 1,000 mg by mouth 2 (two) times daily with a meal.   . montelukast (SINGULAIR) 10 MG tablet Take 10 mg by mouth at bedtime.  Marland Kitchen omeprazole (PRILOSEC) 20 MG capsule Take 20  mg by mouth 2 (two) times daily before a meal.  . verapamil (VERELAN PM) 120 MG 24 hr capsule Take 1 capsule (120 mg total) by mouth 2 (two) times daily.    Allergies: Erythromycin  Social History   Tobacco Use  . Smoking status: Former Smoker    Packs/day: 0.30    Years: 4.00    Pack years: 1.20    Types: Cigarettes    Quit date: 12/22/1983    Years since quitting: 36.6  . Smokeless tobacco: Never Used  Vaping Use  . Vaping Use: Never used  Substance Use Topics  . Alcohol use: Not Currently  . Drug use: No    Family History  Problem Relation Age of Onset  . Heart Problems Mother   . Hypertension Mother   . Angina Mother   . Atrial fibrillation Mother   . Heart Problems Father   . Hypertension Father   . Kidney Stones Sister   . Kidney cancer Neg Hx   . Prostate cancer Neg Hx   . Bladder Cancer Neg Hx     Review of Systems: A 12-system review of systems was performed and was negative except as noted in the HPI.  --------------------------------------------------------------------------------------------------  Physical Exam: BP 124/60 (BP Location: Left Arm, Patient Position: Sitting, Cuff Size: Large)   Pulse 84   Ht 5\' 5"  (1.651 m)   Wt (!) 357 lb 2 oz (162 kg)   SpO2 98%   BMI 59.43 kg/m   General:  NAD HEENT: No conjunctival pallor or scleral icterus. Facemask in place. Neck: Supple without lymphadenopathy, thyromegaly, JVD, or HJR. Lungs: Normal work of breathing. Clear to auscultation bilaterally without wheezes or crackles. Heart: Regular rate and rhythm with 2/6 systolic murmur.  No rubs or gallops. Abd: Bowel sounds present. Soft, NT/ND. Ext: Trace pretibial edema with compression stockings in place.  EKG:  NSR without abnormality.  Lab Results  Component Value Date   WBC 10.9 (H) 03/26/2020   HGB 11.9 (L) 03/26/2020   HCT 38.7 03/26/2020   MCV 90.2 03/26/2020   PLT 330 03/26/2020    Lab Results  Component Value Date   NA 139 03/26/2020     K 4.1 03/26/2020   CL 97 (L) 03/26/2020   CO2 28 03/26/2020   BUN 15 03/26/2020   CREATININE 0.70 07/31/2020   GLUCOSE 139 (H) 03/26/2020   ALT 14 06/04/2009    No results found for: CHOL, HDL, LDLCALC, LDLDIRECT, TRIG, CHOLHDL  --------------------------------------------------------------------------------------------------  ASSESSMENT AND PLAN: Paroxysmal atrial fibrillation: Single episode noted since our last visit, which converted spontaneously to sinus rhythm.  ED workup was unrevealing.  We have agreed to continue current doses of flecainide and verapamil, though we may need to consider increasing flecainide if recurrent a-fib becomes a problem.  Given murmur noted on exam again today, we have agreed to repeat an echocardiogram to assess for new structural abnormalities that may underlie recurrent atrial fibrillation.  Thyroid function to continue to be managed by Dr. Darron Doom.  Gross hematuria: Continue follow-up with Dr. Erlene Quan.  Unless significant hematuria becomes an issue, I favor continuation of anticoagulation for PAF in  the setting of a CHADSVASC score of at least 4 (gender, DM, HTN, and HFpEF).  Chronic HFpEF: Ms. Haydu appears grossly euvolemic without symptoms of decompensated heart failure.  Continue furosemide, verapamil, and losartan.  Hypertension: BP well-controlled.  No medication changes today.  Morbid obesity: BMI ~60.  Weight loss encouraged through diet and exercise, as well as continued CPAP use.  Follow-up: Return to clinic in 3 months.  Nelva Bush, MD 08/08/2020 4:57 PM

## 2020-08-08 NOTE — Progress Notes (Signed)
08/09/2020 3:02 PM   [name] 04-30-1961 [id number]  Referring provider: Hayden Rasmussen, MD [address] [place],  [place] [address] Chief Complaint  Patient presents with  . Follow-up    CT scan results    HPI: [name] is a 59 y.o. female returns for follow up gross hematuria, left kidney stone, and right flank pain.   The patient has a personal history of RUQ pain that radiates to the right side of her back. She has a personal history of nephrolithiasis and hematuria.   CT A/P on 04/25/2020 revealed cholelithiasis. Nonobstructing 6 mm calculus inferior pole LEFT kidney. Tiny umbilical hernia containing fat. No additional intra-abdominal or intrapelvic abnormalities.  Cysto on 07/12/2020 was unremarkable.   CTU on 07/31/2020 showed nonobstructing left renal stones. No other findings to explain the patient's history of hematuria. Hepatomegaly with hepatic steatosis. Cholelithiasis. There was a 10 mm right adrenal adenoma.  She reports gross hematuria and reports documenting each episode (showing photos on phone today).  She denies vaginal bleeding. She has her uterus.   Yesterday she felt exhausted and today she has some pain. She feels like she has referred pain.   She used to smoked socially as a teen and young adult.    PMH: Past Medical History:  Diagnosis Date  . Anxiety 08/23/2014  . Asthma   . Atrial fibrillation ([place]) 06/2014  . Breast cancer ([place])   . Chronic heart failure with preserved ejection fraction (HFpEF) ([place]) 04/21/2019  . Cough 09/06/2018  . Current moderate episode of major depressive disorder without prior episode ([place]) 12/31/2017  . Dependent edema 10/05/2011  . Dyspnea and respiratory abnormality 09/06/2018  . GE reflux   . GERD (gastroesophageal reflux disease) 12/22/1999   Formatting of this note might be different from the original. Controlled by OTC meds  . History of breast cancer 10/05/2011  . HTN (hypertension)     . Hypertension 10/05/2011  . Long term current use of anticoagulant therapy 08/29/2014  . Mild persistent asthma without complication 9/37/1696  . Morbid obesity ([place]) 10/05/2011  . Morbid obesity with BMI of 60.0-69.9, adult ([place]) 09/06/2018  . Obstructive sleep apnea, adult 09/06/2018  . Paroxysmal atrial fibrillation ([place]) 04/21/2019  . Sleep apnea 06/03/2020  . 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 Type 2 diabetes mellitus without complication, without long-term current use of insulin ([place]) 04/21/2019  . Type II or unspecified type diabetes mellitus without mention of complication, not stated as uncontrolled   . Urinary bladder incontinence 06/13/2013    Surgical History: Past Surgical History:  Procedure Laterality Date  . CARDIAC CATHETERIZATION  02/16/2018   No stents;[place]. Collene [place] [facility]  . FINGER SURGERY Right   . MASTECTOMY Right     Home Medications:  Allergies as of 08/09/2020      Reactions   Erythromycin Diarrhea      Medication List       Accurate as of August 09, 2020 11:59 PM. If you have any questions, ask your nurse or doctor.        albuterol 108 (90 Base) MCG/ACT inhaler Commonly known as: VENTOLIN HFA Inhale 2 puffs into the lungs every 6 (six) hours as needed for wheezing or shortness of breath.   buPROPion 300 MG 24 hr tablet Commonly known as: WELLBUTRIN XL Take 300 mg by mouth daily.   cholecalciferol 25 MCG (1000 UNIT) tablet Commonly known as: VITAMIN D3 Take 1,000 Units by  mouth daily.   DULoxetine 20 MG capsule Commonly known as: CYMBALTA Take 20 mg by mouth daily.   Eliquis 5 MG Tabs tablet Generic drug: apixaban Take 5 mg by mouth 2 (two) times daily.   ferrous sulfate 325 (65 FE) MG tablet Take 1 tablet (325 mg total) by mouth daily.   fexofenadine 180 MG tablet Commonly known as: ALLEGRA Take 180 mg by mouth daily.   flecainide 50 MG tablet Commonly known as: TAMBOCOR TAKE 1 TABLET BY MOUTH TWICE A  DAY   fluticasone 50 MCG/ACT nasal spray Commonly known as: FLONASE Place 1 spray into both nostrils daily.   furosemide 40 MG tablet Commonly known as: LASIX Take 40 mg by mouth.   lamoTRIgine 25 MG tablet Commonly known as: LAMICTAL TAKE ONE TABLET BY MOUTH DAILY FOR TWO WEEKS AND THEN INCREASE TO ONE TABLET TWICE A DAY   levothyroxine 50 MCG tablet Commonly known as: SYNTHROID Take 50 mcg by mouth daily before breakfast.   lisinopril 40 MG tablet Commonly known as: ZESTRIL Take 40 mg by mouth daily.   metFORMIN 500 MG tablet Commonly known as: GLUCOPHAGE Take 1,000 mg by mouth 2 (two) times daily. What changed: Another medication with the same name was removed. Continue taking this medication, and follow the directions you see here. Changed by: Hollice Espy, MD   montelukast 10 MG tablet Commonly known as: SINGULAIR Take 10 mg by mouth at bedtime.   omeprazole 20 MG capsule Commonly known as: PRILOSEC Take 20 mg by mouth 2 (two) times daily before a meal.   verapamil 120 MG 24 hr capsule Commonly known as: VERELAN PM Take 1 capsule (120 mg total) by mouth 2 (two) times daily.       Allergies:  Allergies  Allergen Reactions  . Erythromycin Diarrhea    Family History: Family History  Problem Relation Age of Onset  . Heart Problems Mother   . Hypertension Mother   . Angina Mother   . Atrial fibrillation Mother   . Heart Problems Father   . Hypertension Father   . Kidney Stones Sister   . Kidney cancer Neg Hx   . Prostate cancer Neg Hx   . Bladder Cancer Neg Hx     Social History:  reports that she quit smoking about 36 years ago. Her smoking use included cigarettes. She has a 1.20 pack-year smoking history. She has never used smokeless tobacco. She reports previous alcohol use. She reports that she does not use drugs.   Physical Exam: BP (!) 172/82   Pulse 97   Ht 5\' 5"  (1.651 m)   Wt (!) 358 lb (162.4 kg)   BMI 59.57 kg/m   Constitutional:   Alert and oriented, No acute distress. HEENT: [place] AT, moist mucus membranes.  Trachea midline, no masses. Cardiovascular: No clubbing, cyanosis, or edema. Respiratory: Normal respiratory effort, no increased work of breathing. Skin: No rashes, bruises or suspicious lesions. Neurologic: Grossly intact, no focal deficits, moving all 4 extremities. Psychiatric: Normal mood and affect.  Laboratory Data:  Lab Results  Component Value Date   CREATININE 0.70 07/31/2020     Pertinent Imaging:  Results for orders placed during the hospital encounter of 07/31/20  CT HEMATURIA WORKUP  Narrative CLINICAL DATA:  Gross hematuria with flank pain.  EXAM: CT ABDOMEN AND PELVIS WITHOUT AND WITH CONTRAST  TECHNIQUE: Multidetector CT imaging of the abdomen and pelvis was performed following the standard protocol before and following the bolus administration of intravenous contrast.  CONTRAST:  120mL OMNIPAQUE IOHEXOL 300 MG/ML  SOLN  COMPARISON:  04/25/2020  FINDINGS: Lower chest: Unremarkable  Hepatobiliary: The liver shows diffusely decreased attenuation suggesting fat deposition. Liver measures 20.8 cm craniocaudal length, enlarged. Calcified gallstones noted. No intrahepatic or extrahepatic biliary dilation.  Pancreas: No focal mass lesion. No dilatation of the main duct. No intraparenchymal cyst. No peripancreatic edema.  Spleen: No splenomegaly. No focal mass lesion.  Adrenals/Urinary Tract: Left adrenal gland unremarkable. 10 mm right adrenal nodule has low attenuation suggesting adenoma.  Precontrast imaging shows no stones in the right kidney or ureter. Adjacent nonobstructing stones in the lower pole left kidney measure 5 mm and 6 mm. No left ureteral stones. No bladder stones  Imaging after IV contrast administration shows no suspicious enhancing lesion in either kidney.  Delayed post-contrast imaging shows no wall thickening or soft tissue filling defect in either  intrarenal collecting system or renal pelvis. Neither ureter is completely opacified but there is no focal dilatation or mass lesion along the course of either ureter. Delayed imaging of the bladder shows no focal wall thickening or mass lesion.  Stomach/Bowel: Stomach is unremarkable. No gastric wall thickening. No evidence of outlet obstruction. Duodenum is normally positioned as is the ligament of Treitz. No small bowel wall thickening. No small bowel dilatation. No gross colonic mass. No colonic wall thickening.  Vascular/Lymphatic: No abdominal aortic aneurysm. 16 mm short axis hepatoduodenal ligament lymph node is stable in the interval, likely reactive. No retroperitoneal or mesenteric lymphadenopathy. No pelvic sidewall lymphadenopathy.  Reproductive: Unremarkable.  Other: No intraperitoneal free fluid.  Musculoskeletal: No worrisome lytic or sclerotic osseous abnormality.  IMPRESSION: 1. Nonobstructing left renal stones. No other findings to explain the patient's history of hematuria. 2. Hepatomegaly with hepatic steatosis. 3. Cholelithiasis. 4. 10 mm right adrenal adenoma. 5. Aortic Atherosclerosis (ICD10-I70.0).   Electronically Signed By: Misty Stanley M.D. On: 07/31/2020 14:36   I have personally reviewed the images and agree with radiologist interpretation.    Assessment & Plan:    1. Gross hematuria No clear etiology given a negative cystoscopy and CTU is negative. Urine cytology today for completelness I will plan for a follow up cystoscopy in 6 month to monitor more closely, return soon if episodes increase in severity and frequency  -Urine cytology sent.  2. Right flank pain vs RUQ pain Likely unrelated to the kidney's and more related to the gallbladder  3. Left kidney stone  5-6 mm stone Asymptomatic Recommend KUB in 1 year [employer] reviewed  F/u 6 months  [facility] [address] [place], [place] [address] [phone]  I, Selena Batten, am acting as a scribe for Dr. Hollice Espy.  I have reviewed the above documentation for accuracy and completeness, and I agree with the above.   Hollice Espy, MD

## 2020-08-08 NOTE — Patient Instructions (Signed)
Medication Instructions:  Your physician recommends that you continue on your current medications as directed. Please refer to the Current Medication list given to you today.  *If you need a refill on your cardiac medications before your next appointment, please call your pharmacy*  Lab Work: none If you have labs (blood work) drawn today and your tests are completely normal, you will receive your results only by: Marland Kitchen MyChart Message (if you have MyChart) OR . A paper copy in the mail If you have any lab test that is abnormal or we need to change your treatment, we will call you to review the results.  Testing/Procedures: Your physician has requested that you have an echocardiogram. Echocardiography is a painless test that uses sound waves to create images of your heart. It provides your doctor with information about the size and shape of your heart and how well your heart's chambers and valves are working. This procedure takes approximately one hour. There are no restrictions for this procedure. You may get an IV, if needed, to receive an ultrasound enhancing agent through to better visualize your heart.   Follow-Up: At [facility], you and your health needs are our priority.  As part of our continuing mission to provide you with exceptional heart care, we have created designated Provider Care Teams.  These Care Teams include your primary Cardiologist (physician) and Advanced Practice Providers (APPs -  Physician Assistants and Nurse Practitioners) who all work together to provide you with the care you need, when you need it.  We recommend signing up for the patient portal called "MyChart".  Sign up information is provided on this After Visit Summary.  MyChart is used to connect with patients for Virtual Visits (Telemedicine).  Patients are able to view lab/test results, encounter notes, upcoming appointments, etc.  Non-urgent messages can be sent to your provider as well.   To learn more about  what you can do with MyChart, go to [email or web address].    Your next appointment:   3 month(s)  The format for your next appointment:   In Person  Provider:    You may see Nelva Bush, MD or one of the following Advanced Practice Providers on your designated Care Team:    Murray Hodgkins, NP  Christell Faith, PA-C  Marrianne Mood, PA-C

## 2020-08-09 ENCOUNTER — Other Ambulatory Visit
Admission: RE | Admit: 2020-08-09 | Discharge: 2020-08-09 | Disposition: A | Discharge status: Home / Self Care | Payer: 59 | Source: Ambulatory Visit | Attending: Urology | Admitting: Urology

## 2020-08-09 ENCOUNTER — Encounter: Payer: Self-pay | Admitting: Urology

## 2020-08-09 ENCOUNTER — Ambulatory Visit (INDEPENDENT_AMBULATORY_CARE_PROVIDER_SITE_OTHER): Payer: 59 | Admitting: Urology

## 2020-08-09 VITALS — BP 172/82 | HR 97 | Ht 65.0 in | Wt 358.0 lb

## 2020-08-09 DIAGNOSIS — R310 Gross hematuria: Secondary | ICD-10-CM

## 2020-08-10 ENCOUNTER — Encounter: Payer: Self-pay | Admitting: Internal Medicine

## 2020-08-10 DIAGNOSIS — R310 Gross hematuria: Secondary | ICD-10-CM | POA: Insufficient documentation

## 2020-08-12 ENCOUNTER — Telehealth: Payer: Self-pay | Admitting: *Deleted

## 2020-08-12 LAB — CYTOLOGY - NON PAP

## 2020-08-12 NOTE — Telephone Encounter (Addendum)
Patient voiced understanding.     ----- Message from Hollice Espy, MD sent at 08/12/2020  3:18 PM EDT ----- FYI- urine cytology was negative, that's good news.   Hollice Espy, MD

## 2020-08-21 LAB — HM PAP SMEAR: HM Pap smear: NORMAL

## 2020-08-29 ENCOUNTER — Encounter: Payer: Self-pay | Admitting: Urology

## 2020-08-30 ENCOUNTER — Ambulatory Visit (INDEPENDENT_AMBULATORY_CARE_PROVIDER_SITE_OTHER): Payer: 59

## 2020-08-30 ENCOUNTER — Other Ambulatory Visit: Payer: Self-pay

## 2020-08-30 DIAGNOSIS — I480 Paroxysmal atrial fibrillation: Secondary | ICD-10-CM

## 2020-08-30 LAB — ECHOCARDIOGRAM COMPLETE
AR max vel: 2.22 cm2
AV Area VTI: 2.04 cm2
AV Area mean vel: 2.08 cm2
AV Mean grad: 8 mmHg
AV Peak grad: 16.5 mmHg
Ao pk vel: 2.03 m/s
Area-P 1/2: 4.41 cm2
Calc EF: 62.7 %
S' Lateral: 3 cm
Single Plane A2C EF: 62.5 %
Single Plane A4C EF: 59.3 %

## 2020-09-03 ENCOUNTER — Other Ambulatory Visit: Payer: Self-pay | Admitting: *Deleted

## 2020-09-03 MED ORDER — VERAPAMIL HCL ER 120 MG PO CP24: 120.0000 mg | ORAL_CAPSULE | Freq: Two times a day (BID) | ORAL | 2 refills | Status: DC

## 2020-09-03 NOTE — Telephone Encounter (Signed)
While results for patient's echo, she said she needed refill on Verapamil. Refill sent.

## 2020-09-21 ENCOUNTER — Encounter: Payer: Self-pay | Admitting: Urology

## 2020-09-30 ENCOUNTER — Encounter: Payer: Self-pay | Admitting: Urology

## 2020-10-01 ENCOUNTER — Encounter: Payer: Self-pay | Admitting: Physician Assistant

## 2020-10-01 ENCOUNTER — Other Ambulatory Visit: Payer: Self-pay

## 2020-10-01 ENCOUNTER — Ambulatory Visit (INDEPENDENT_AMBULATORY_CARE_PROVIDER_SITE_OTHER): Payer: 59 | Admitting: Physician Assistant

## 2020-10-01 VITALS — BP 187/75 | HR 102 | Ht 65.0 in | Wt 352.0 lb

## 2020-10-01 DIAGNOSIS — R300 Dysuria: Secondary | ICD-10-CM | POA: Diagnosis not present

## 2020-10-01 DIAGNOSIS — R3129 Other microscopic hematuria: Secondary | ICD-10-CM

## 2020-10-01 LAB — BLADDER SCAN AMB NON-IMAGING: Scan Result: 2

## 2020-10-01 NOTE — Progress Notes (Signed)
10/01/2020 3:49 PM   [name] 12/10/1961 [id number]  CC: Chief Complaint  Patient presents with  . Recurrent UTI   HPI: [name] is a 59 y.o. female with PMH gross hematuria with benign work-up in 2021, left kidney stone, and right flank pain who presents today for evaluation of possible UTI.  Today she reports an episode of severe dysuria and urinary frequency associated with lower abdominal pressure, urgency, and LLQ pain lasting approximately 3 days and starting 5 days ago.  She states that 2 days ago, she saw a black fleck in the toilet.  She has not been taking any pain medication for management of her symptoms.  She has been drinking concentrated cranberry juice for possible UTI and states she believes this has helped.  Her dysuria has resolved, however she has some persistent urinary urgency.  She was noted to have a 6 mm inferior pole left renal stone on CTAP dated 04/25/2020.  Per CT urogram dated 07/31/2020, she has adjacent 5 and 6 mm inferior pole left renal stones.  In-office UA today positive for 2+ blood; urine microscopy with 6-10 WBCs/HPF, 11-30 RBCs/HPF, and few+ bacteria. PVR 42mL.  PMH: Past Medical History:  Diagnosis Date  . Anxiety 08/23/2014  . Asthma   . Atrial fibrillation ([place]) 06/2014  . Breast cancer ([place])   . Chronic heart failure with preserved ejection fraction (HFpEF) ([place]) 04/21/2019  . Cough 09/06/2018  . Current moderate episode of major depressive disorder without prior episode ([place]) 12/31/2017  . Dependent edema 10/05/2011  . Dyspnea and respiratory abnormality 09/06/2018  . GE reflux   . GERD (gastroesophageal reflux disease) 12/22/1999   Formatting of this note might be different from the original. Controlled by OTC meds  . History of breast cancer 10/05/2011  . HTN (hypertension)   . Hypertension 10/05/2011  . Long term current use of anticoagulant therapy 08/29/2014  . Mild persistent asthma without complication 07/02/4579  .  Morbid obesity ([place]) 10/05/2011  . Morbid obesity with BMI of 60.0-69.9, adult ([place]) 09/06/2018  . Obstructive sleep apnea, adult 09/06/2018  . Paroxysmal atrial fibrillation ([place]) 04/21/2019  . Sleep apnea 06/03/2020  . 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 Type 2 diabetes mellitus without complication, without long-term current use of insulin ([place]) 04/21/2019  . Type II or unspecified type diabetes mellitus without mention of complication, not stated as uncontrolled   . Urinary bladder incontinence 06/13/2013    Surgical History: Past Surgical History:  Procedure Laterality Date  . CARDIAC CATHETERIZATION  02/16/2018   No stents;[place]. Collene [place] [facility]  . FINGER SURGERY Right   . MASTECTOMY Right     Home Medications:  Allergies as of 10/01/2020      Reactions   Erythromycin Diarrhea      Medication List       Accurate as of October 01, 2020  3:49 PM. If you have any questions, ask your nurse or doctor.        albuterol 108 (90 Base) MCG/ACT inhaler Commonly known as: VENTOLIN HFA Inhale 2 puffs into the lungs every 6 (six) hours as needed for wheezing or shortness of breath.   buPROPion 300 MG 24 hr tablet Commonly known as: WELLBUTRIN XL Take 300 mg by mouth daily.   cholecalciferol 25 MCG (1000 UNIT) tablet Commonly known as: VITAMIN D3 Take 1,000 Units by mouth daily.   DULoxetine 20 MG capsule Commonly known as: CYMBALTA Take 20 mg by mouth daily.  Eliquis 5 MG Tabs tablet Generic drug: apixaban Take 5 mg by mouth 2 (two) times daily.   ferrous sulfate 325 (65 FE) MG tablet Take 1 tablet (325 mg total) by mouth daily.   fexofenadine 180 MG tablet Commonly known as: ALLEGRA Take 180 mg by mouth daily.   flecainide 50 MG tablet Commonly known as: TAMBOCOR TAKE 1 TABLET BY MOUTH TWICE A DAY   fluticasone 50 MCG/ACT nasal spray Commonly known as: FLONASE Place 1 spray into both nostrils daily.   furosemide 40 MG  tablet Commonly known as: LASIX Take 40 mg by mouth.   lamoTRIgine 25 MG tablet Commonly known as: LAMICTAL TAKE ONE TABLET BY MOUTH DAILY FOR TWO WEEKS AND THEN INCREASE TO ONE TABLET TWICE A DAY   levothyroxine 50 MCG tablet Commonly known as: SYNTHROID Take 50 mcg by mouth daily before breakfast.   lisinopril 40 MG tablet Commonly known as: ZESTRIL Take 40 mg by mouth daily.   metFORMIN 500 MG tablet Commonly known as: GLUCOPHAGE Take 1,000 mg by mouth 2 (two) times daily.   montelukast 10 MG tablet Commonly known as: SINGULAIR Take 10 mg by mouth at bedtime.   omeprazole 20 MG capsule Commonly known as: PRILOSEC Take 20 mg by mouth 2 (two) times daily before a meal.   verapamil 120 MG 24 hr capsule Commonly known as: VERELAN PM Take 1 capsule (120 mg total) by mouth 2 (two) times daily.       Allergies:  Allergies  Allergen Reactions  . Erythromycin Diarrhea    Family History: Family History  Problem Relation Age of Onset  . Heart Problems Mother   . Hypertension Mother   . Angina Mother   . Atrial fibrillation Mother   . Heart Problems Father   . Hypertension Father   . Kidney Stones Sister   . Kidney cancer Neg Hx   . Prostate cancer Neg Hx   . Bladder Cancer Neg Hx     Social History:   reports that she quit smoking about 36 years ago. Her smoking use included cigarettes. She has a 1.20 pack-year smoking history. She has never used smokeless tobacco. She reports previous alcohol use. She reports that she does not use drugs.  Physical Exam: BP (!) 187/75   Pulse (!) 102   Ht 5\' 5"  (1.651 m)   Wt (!) 352 lb (159.7 kg)   BMI 58.58 kg/m   Constitutional:  Alert and oriented, no acute distress, nontoxic appearing HEENT: [place], AT Cardiovascular: No clubbing, cyanosis, or edema Respiratory: Normal respiratory effort, no increased work of breathing Skin: No rashes, bruises or suspicious lesions Neurologic: Grossly intact, no focal deficits, moving  all 4 extremities Psychiatric: Normal mood and affect  Laboratory Data: Results for orders placed or performed in visit on 10/01/20  CULTURE, URINE COMPREHENSIVE   Specimen: Urine   UR  Result Value Ref Range   Urine Culture, Comprehensive Preliminary report    Organism ID, Bacteria Comment   Microscopic Examination   Urine  Result Value Ref Range   WBC, UA 6-10 (A) 0 - 5 /hpf   RBC 11-30 (A) 0 - 2 /hpf   Epithelial Cells (non renal) 0-10 0 - 10 /hpf   Mucus, UA Present (A) Not Estab.   Bacteria, UA Few None seen/Few  Urinalysis, Complete  Result Value Ref Range   Specific Gravity, UA 1.015 1.005 - 1.030   pH, UA 5.5 5.0 - 7.5   Color, UA Yellow Yellow  Appearance Ur Cloudy (A) Clear   Leukocytes,UA Negative Negative   Protein,UA Negative Negative/Trace   Glucose, UA Negative Negative   Ketones, UA Negative Negative   RBC, UA 2+ (A) Negative   Bilirubin, UA Negative Negative   Urobilinogen, Ur 0.2 0.2 - 1.0 mg/dL   Nitrite, UA Negative Negative   Microscopic Examination See below:   BLADDER SCAN AMB NON-IMAGING  Result Value Ref Range   Scan Result 2    Assessment & Plan:   1. Dysuria 59 year old female with a history of cyclic hematuria with flank pain and known left renal stones presents with a 3-day long episode of severe dysuria, LLQ pain, urgency, and frequency over the weekend with progressive relief after seeing a fleck of black material in the toilet consistent with stone versus small clot.  UA today notable for mild pyuria and microscopic hematuria.  Will send for culture for further evaluation and treat as indicated.  Will call patient with results and to discuss any lingering symptoms.  If urine culture is negative and she remains symptomatic, I recommend KUB for further evaluation of possible acute stone episode. - Urinalysis, Complete - BLADDER SCAN AMB NON-IMAGING - CULTURE, URINE COMPREHENSIVE  2. Microscopic hematuria Possibly associated with #1 above,  however also possibly associated with her known history of gross hematuria.  Will defer further evaluation, as she recently completed hematuria work-up.  Return if symptoms worsen or fail to improve.  Nichole Loop, PA-C  [facility] [address] [place], [place] [address] [phone]

## 2020-10-01 NOTE — Patient Instructions (Signed)
I will send your urine for culture today and call you with your results.  In the meantime, I recommend that you try Azo urinary pain relief, available over-the-counter at your local pharmacy.  Please do not take this for longer than 2 days continuously.

## 2020-10-02 LAB — URINALYSIS, COMPLETE
Bilirubin, UA: NEGATIVE
Glucose, UA: NEGATIVE
Ketones, UA: NEGATIVE
Leukocytes,UA: NEGATIVE
Nitrite, UA: NEGATIVE
Protein,UA: NEGATIVE
Specific Gravity, UA: 1.015 (ref 1.005–1.030)
Urobilinogen, Ur: 0.2 mg/dL (ref 0.2–1.0)
pH, UA: 5.5 (ref 5.0–7.5)

## 2020-10-02 LAB — MICROSCOPIC EXAMINATION

## 2020-10-03 ENCOUNTER — Encounter: Payer: Self-pay | Admitting: Urology

## 2020-10-04 LAB — CULTURE, URINE COMPREHENSIVE

## 2020-10-30 ENCOUNTER — Other Ambulatory Visit: Payer: Self-pay | Admitting: Internal Medicine

## 2020-11-22 ENCOUNTER — Encounter: Payer: Self-pay | Admitting: Internal Medicine

## 2020-11-22 ENCOUNTER — Other Ambulatory Visit: Payer: Self-pay

## 2020-11-22 ENCOUNTER — Ambulatory Visit (INDEPENDENT_AMBULATORY_CARE_PROVIDER_SITE_OTHER): Payer: 59 | Admitting: Internal Medicine

## 2020-11-22 VITALS — BP 143/76 | HR 77 | Ht 65.0 in | Wt 354.6 lb

## 2020-11-22 DIAGNOSIS — I480 Paroxysmal atrial fibrillation: Secondary | ICD-10-CM | POA: Diagnosis not present

## 2020-11-22 DIAGNOSIS — I10 Essential (primary) hypertension: Secondary | ICD-10-CM | POA: Diagnosis not present

## 2020-11-22 DIAGNOSIS — I5032 Chronic diastolic (congestive) heart failure: Secondary | ICD-10-CM | POA: Diagnosis not present

## 2020-11-22 NOTE — Progress Notes (Signed)
Follow-up Outpatient Visit Date: 11/22/2020  Primary Care Provider: Hayden Rasmussen, MD [place] [facility] [place] [address]  Chief Complaint: Follow-up HFpEF and atrial fibrillation  HPI:  [name] is a 59 y.o. female with history of chronic HFpEF, paroxysmal atrial fibrillation, obstructive sleep apnea, hypertension, type 2 diabetes mellitus, and breast cancer, who presents for follow-up of atrial fibrillation and HFpEF.  I last saw [name] in August, at which time she was doing well without any further palpitations.  She noted intermittent gross hematuria and right flank pain for which she was undergoing further evaluation by urology.  No clear etiology for hematuria was identified.  No medication changes or additional testing were pursued.  Today, [name] reports she is actually been feeling quite well.  She has some exertional dyspnea that is chronic and unchanged from her baseline.  If anything, her breathing seems to be better this time a year due to less humidity and fewer allergies.  She has stable lower extremity edema.  She has not had any further hematuria after passing a kidney stone.  She denies chest pain, palpitations, and lightheadedness.  --------------------------------------------------------------------------------------------------  Cardiovascular History & Procedures: Cardiovascular Problems:  Paroxysmal atrial fibrillation  Chronic HFpEF  Risk Factors:  Hypertension, diabetes mellitus, and morbid obesity  Cath/PCI:  LHC (02/16/2018): Mild luminal irregularities involving the LAD, LCx, and RCA. LVEDP 34 mmHg.  CV Surgery:  None  EP Procedures and Devices:  None  Non-Invasive Evaluation(s):  Myocardial PET/CT (01/07/2018): Small in size, moderate in severity, reversible anterior defect that may represent ischemia. Small to moderate in size, moderate in severity, apical lateral and apical defect that is partially reversible and  may represent ischemia. LVEF 61%.  TTE (11/09/2017): Normal LV size and function. LVEF 60-65% with normal diastolic function. Normal RV size and function. Mitral annular calcification present.  Recent CV Pertinent Labs: Lab Results  Component Value Date   K 4.1 03/26/2020   BUN 15 03/26/2020   CREATININE 0.70 07/31/2020    Past medical and surgical history were reviewed and updated in EPIC.  Current Meds  Medication Sig  . albuterol (PROVENTIL HFA;VENTOLIN HFA) 108 (90 Base) MCG/ACT inhaler Inhale 2 puffs into the lungs every 6 (six) hours as needed for wheezing or shortness of breath.  Marland Kitchen apixaban (ELIQUIS) 5 MG TABS tablet Take 5 mg by mouth 2 (two) times daily.  Marland Kitchen buPROPion (WELLBUTRIN XL) 300 MG 24 hr tablet Take 300 mg by mouth daily.  . cholecalciferol (VITAMIN D3) 25 MCG (1000 UT) tablet Take 50,000 Units by mouth once a week.   . DULoxetine (CYMBALTA) 20 MG capsule Take 20 mg by mouth daily.  . ferrous sulfate 325 (65 FE) MG tablet Take 1 tablet (325 mg total) by mouth daily.  . fexofenadine (ALLEGRA) 180 MG tablet Take 180 mg by mouth daily.  . flecainide (TAMBOCOR) 50 MG tablet TAKE 1 TABLET BY MOUTH TWICE A DAY  . fluticasone (FLONASE) 50 MCG/ACT nasal spray Place 1 spray into both nostrils daily.  . furosemide (LASIX) 40 MG tablet Take 40 mg by mouth.  . lamoTRIgine (LAMICTAL) 25 MG tablet TAKE ONE TABLET BY MOUTH DAILY FOR TWO WEEKS AND THEN INCREASE TO ONE TABLET TWICE A DAY  . levothyroxine (SYNTHROID) 50 MCG tablet Take 50 mcg by mouth daily before breakfast.  . lisinopril (PRINIVIL,ZESTRIL) 40 MG tablet Take 40 mg by mouth daily.  . metFORMIN (GLUCOPHAGE) 500 MG tablet Take 1,000 mg by mouth 2 (two)  times daily.  . montelukast (SINGULAIR) 10 MG tablet Take 10 mg by mouth at bedtime.  Marland Kitchen omeprazole (PRILOSEC) 20 MG capsule Take 20 mg by mouth 2 (two) times daily before a meal.  . verapamil (VERELAN PM) 120 MG 24 hr capsule Take 1 capsule (120 mg total) by mouth 2  (two) times daily.    Allergies: Erythromycin  Social History   Tobacco Use  . Smoking status: Former Smoker    Packs/day: 0.30    Years: 4.00    Pack years: 1.20    Types: Cigarettes    Quit date: 12/22/1983    Years since quitting: 36.9  . Smokeless tobacco: Never Used  Vaping Use  . Vaping Use: Never used  Substance Use Topics  . Alcohol use: Not Currently  . Drug use: No    Family History  Problem Relation Age of Onset  . Heart Problems Mother   . Hypertension Mother   . Angina Mother   . Atrial fibrillation Mother   . Heart Problems Father   . Hypertension Father   . Kidney Stones Sister   . Kidney cancer Neg Hx   . Prostate cancer Neg Hx   . Bladder Cancer Neg Hx     Review of Systems: A 12-system review of systems was performed and was negative except as noted in the HPI.  --------------------------------------------------------------------------------------------------  Physical Exam: BP (!) 143/76 (BP Location: Left Arm, Patient Position: Sitting, Cuff Size: Large)   Pulse 77   Ht 5\' 5"  (1.651 m)   Wt (!) 354 lb 9.6 oz (160.8 kg)   BMI 59.01 kg/m   General: NAD. Neck no JVD or HJR, though body habitus limits evaluation. Lungs: Clear to auscultation bilateral without wheezes or crackles. Heart: Distant heart sounds.  Regular rate and rhythm without murmurs, rubs, or gallops. Abdomen: Obese but soft with mild epigastric tenderness. Extremities: Trace pretibial edema.  EKG:  NSR without abnormality.  Lab Results  Component Value Date   WBC 10.9 (H) 03/26/2020   HGB 11.9 (L) 03/26/2020   HCT 38.7 03/26/2020   MCV 90.2 03/26/2020   PLT 330 03/26/2020    Lab Results  Component Value Date   NA 139 03/26/2020   K 4.1 03/26/2020   CL 97 (L) 03/26/2020   CO2 28 03/26/2020   BUN 15 03/26/2020   CREATININE 0.70 07/31/2020   GLUCOSE 139 (H) 03/26/2020   ALT 14 06/04/2009    No results found for: CHOL, HDL, LDLCALC, LDLDIRECT, TRIG,  CHOLHDL  --------------------------------------------------------------------------------------------------  ASSESSMENT AND PLAN: Chronic HFpEF: [name] reports stable NYHA class II symptoms.  Other than chronic lower extremity edema, she appears euvolemic.  Her weight is relatively stable, though her morbid obesity complicates assessment of volume status.  We have agreed to continue current medications.  I have encouraged her to minimize her sodium intake as well as to work on weight loss through diet and exercise.  Paroxysmal atrial fibrillation: No symptoms to suggest recurrence.  EKG today demonstrates normal sinus rhythm.  We will plan to continue current regimen of apixaban, flecainide, and verapamil.  Hypertension: Blood pressure subsuboptimally controlled today as at prior visits.  We discussed escalation of medical therapy, though Ms. Joaquin wishes to defer this in favor of weight loss.  If blood pressure remains elevated at follow-up, escalation of verapamil versus addition of an alternative agent will need to be considered to target a blood pressure less than 130/80.  Morbid obesity: BMI remains close to 60.  Ms. Morganti states that she has lost over 100 pounds in the past but has been under more stress lately with the death of her mother last year.  We discussed referral to a bariatric clinic but have agreed to defer this for now.  Follow-up: Return to clinic in 6 months.  Nelva Bush, MD 11/22/2020 9:34 AM

## 2020-11-22 NOTE — Patient Instructions (Signed)
Medication Instructions:  Your physician recommends that you continue on your current medications as directed. Please refer to the Current Medication list given to you today.  *If you need a refill on your cardiac medications before your next appointment, please call your pharmacy*  Lab Work: Worthy Keeler will be requested from your primary care physician.  If you have labs (blood work) drawn today and your tests are completely normal, you will receive your results only by: Marland Kitchen MyChart Message (if you have MyChart) OR . A paper copy in the mail If you have any lab test that is abnormal or we need to change your treatment, we will call you to review the results.  Testing/Procedures: none  Follow-Up: At [facility], you and your health needs are our priority.  As part of our continuing mission to provide you with exceptional heart care, we have created designated Provider Care Teams.  These Care Teams include your primary Cardiologist (physician) and Advanced Practice Providers (APPs -  Physician Assistants and Nurse Practitioners) who all work together to provide you with the care you need, when you need it.  We recommend signing up for the patient portal called "MyChart".  Sign up information is provided on this After Visit Summary.  MyChart is used to connect with patients for Virtual Visits (Telemedicine).  Patients are able to view lab/test results, encounter notes, upcoming appointments, etc.  Non-urgent messages can be sent to your provider as well.   To learn more about what you can do with MyChart, go to [email or web address].    Your next appointment:   6 month(s)  The format for your next appointment:   In Person  Provider:   You may see Nelva Bush, MD or one of the following Advanced Practice Providers on your designated Care Team:    Murray Hodgkins, NP  Christell Faith, PA-C  Marrianne Mood, PA-C  Cadence Gulkana, [place]  Laurann [place], NP

## 2021-01-03 ENCOUNTER — Other Ambulatory Visit: Payer: Self-pay | Admitting: Internal Medicine

## 2021-02-14 ENCOUNTER — Other Ambulatory Visit: Payer: 59 | Admitting: Urology

## 2021-03-12 ENCOUNTER — Other Ambulatory Visit: Payer: Self-pay

## 2021-03-12 ENCOUNTER — Emergency Department (HOSPITAL_BASED_OUTPATIENT_CLINIC_OR_DEPARTMENT_OTHER)
Admission: EM | Admit: 2021-03-12 | Discharge: 2021-03-12 | Disposition: A | Discharge status: Home / Self Care | Payer: 59 | Attending: Emergency Medicine | Admitting: Emergency Medicine

## 2021-03-12 ENCOUNTER — Ambulatory Visit (HOSPITAL_COMMUNITY)
Admission: EM | Admit: 2021-03-12 | Discharge: 2021-03-12 | Disposition: A | Discharge status: Home / Self Care | Payer: 59

## 2021-03-12 DIAGNOSIS — Z7984 Long term (current) use of oral hypoglycemic drugs: Secondary | ICD-10-CM | POA: Insufficient documentation

## 2021-03-12 DIAGNOSIS — I110 Hypertensive heart disease with heart failure: Secondary | ICD-10-CM | POA: Diagnosis not present

## 2021-03-12 DIAGNOSIS — I5033 Acute on chronic diastolic (congestive) heart failure: Secondary | ICD-10-CM | POA: Diagnosis not present

## 2021-03-12 DIAGNOSIS — Z7901 Long term (current) use of anticoagulants: Secondary | ICD-10-CM | POA: Insufficient documentation

## 2021-03-12 DIAGNOSIS — Z87891 Personal history of nicotine dependence: Secondary | ICD-10-CM | POA: Insufficient documentation

## 2021-03-12 DIAGNOSIS — Y92019 Unspecified place in single-family (private) house as the place of occurrence of the external cause: Secondary | ICD-10-CM | POA: Diagnosis not present

## 2021-03-12 DIAGNOSIS — S61233A Puncture wound without foreign body of left middle finger without damage to nail, initial encounter: Secondary | ICD-10-CM | POA: Insufficient documentation

## 2021-03-12 DIAGNOSIS — S60512A Abrasion of left hand, initial encounter: Secondary | ICD-10-CM | POA: Insufficient documentation

## 2021-03-12 DIAGNOSIS — W5501XA Bitten by cat, initial encounter: Secondary | ICD-10-CM | POA: Insufficient documentation

## 2021-03-12 DIAGNOSIS — S61230A Puncture wound without foreign body of right index finger without damage to nail, initial encounter: Secondary | ICD-10-CM | POA: Insufficient documentation

## 2021-03-12 DIAGNOSIS — E119 Type 2 diabetes mellitus without complications: Secondary | ICD-10-CM | POA: Insufficient documentation

## 2021-03-12 DIAGNOSIS — S6991XA Unspecified injury of right wrist, hand and finger(s), initial encounter: Secondary | ICD-10-CM | POA: Diagnosis present

## 2021-03-12 DIAGNOSIS — Z79899 Other long term (current) drug therapy: Secondary | ICD-10-CM | POA: Insufficient documentation

## 2021-03-12 DIAGNOSIS — Z853 Personal history of malignant neoplasm of breast: Secondary | ICD-10-CM | POA: Diagnosis not present

## 2021-03-12 DIAGNOSIS — J45909 Unspecified asthma, uncomplicated: Secondary | ICD-10-CM | POA: Insufficient documentation

## 2021-03-12 MED ORDER — BACITRACIN ZINC 500 UNIT/GM EX OINT: TOPICAL_OINTMENT | Freq: Once | CUTANEOUS | Status: AC

## 2021-03-12 MED ORDER — AMOXICILLIN-POT CLAVULANATE 875-125 MG PO TABS
1.0000 | ORAL_TABLET | Freq: Two times a day (BID) | ORAL | 0 refills | Status: DC
Start: 2021-03-12 — End: 2021-06-18

## 2021-03-12 NOTE — ED Triage Notes (Signed)
Pt trapped a Ferel kitten and bit and scratched th pt on the hand.

## 2021-03-12 NOTE — ED Provider Notes (Signed)
[facility] EMERGENCY DEPT Provider Note   CSN: [id number] Arrival date & time: 03/12/21  1011     History Chief Complaint  Patient presents with  . Animal Bite    [name] is a 60 y.o. female.  [name] is a right-handed 60 year old patient who does have a history of diabetes and atrial fibrillation.  She presents after being scratched and bitten by a feral cat.  The patient is attempting to rescue a colony of feral cats that live near her home.  She has rescued 9 of the 10 cat so far, and this kitten was the last one.  It is approximately 89 months old, and she has observed it since it was born.  She reports today the cat wandered into her home, and she felt the cat would tolerate being trapped in order to take the cat to the vet.  However, in that process, the cat bit and scratched her.  The history is provided by the patient.  Animal Bite Contact animal:  Cat Location:  Hand Hand injury location:  L hand and R hand Time since incident:  20 minutes Pain details:    Quality:  Sharp   Severity:  Mild   Timing:  Constant   Progression:  Unchanged Incident location:  Home Provoked: unprovoked   Notifications:  None Animal's rabies vaccination status:  Never received Animal in possession: yes   Tetanus status:  Up to date Relieved by:  Nothing Worsened by:  Nothing Ineffective treatments:  None tried Associated symptoms: no fever, no numbness and no rash        Past Medical History:  Diagnosis Date  . Anxiety 08/23/2014  . Asthma   . Atrial fibrillation ([place]) 06/2014  . Breast cancer ([place])   . Chronic heart failure with preserved ejection fraction (HFpEF) ([place]) 04/21/2019  . Cough 09/06/2018  . Current moderate episode of major depressive disorder without prior episode ([place]) 12/31/2017  . Dependent edema 10/05/2011  . Dyspnea and respiratory abnormality 09/06/2018  . GE reflux   . GERD (gastroesophageal reflux disease) 12/22/1999   Formatting  of this note might be different from the original. Controlled by OTC meds  . History of breast cancer 10/05/2011  . HTN (hypertension)   . Hypertension 10/05/2011  . Long term current use of anticoagulant therapy 08/29/2014  . Mild persistent asthma without complication 1/75/1025  . Morbid obesity ([place]) 10/05/2011  . Morbid obesity with BMI of 60.0-69.9, adult ([place]) 09/06/2018  . Obstructive sleep apnea, adult 09/06/2018  . Paroxysmal atrial fibrillation ([place]) 04/21/2019  . Sleep apnea 06/03/2020  . 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 Type 2 diabetes mellitus without complication, without long-term current use of insulin ([place]) 04/21/2019  . Type II or unspecified type diabetes mellitus without mention of complication, not stated as uncontrolled   . Urinary bladder incontinence 06/13/2013    Patient Active Problem List   Diagnosis Date Noted  . Gross hematuria 08/10/2020  . Sleep apnea 06/03/2020  . Paroxysmal atrial fibrillation ([place]) 04/21/2019  . Chronic heart failure with preserved ejection fraction (HFpEF) ([place]) 04/21/2019  . Type 2 diabetes mellitus without complication, without long-term current use of insulin ([place]) 04/21/2019  . Cough 09/06/2018  . Dyspnea and respiratory abnormality 09/06/2018  . Mild persistent asthma without complication 85/27/7824  . Morbid obesity with BMI of 60.0-69.9, adult ([place]) 09/06/2018  . Need for influenza vaccination 09/06/2018  . Obstructive sleep apnea, adult 09/06/2018  . Abnormal stress test 01/25/2018  .  Current moderate episode of major depressive disorder without prior episode ([place]) 12/31/2017  . Low serum vitamin D 12/31/2017  . Depression 09/20/2014  . Long term current use of anticoagulant therapy 08/29/2014  . Long-term use of high-risk medication 08/29/2014  . Anxiety 08/23/2014  . 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 Urinary bladder incontinence 06/13/2013  . Edema 12/16/2012  . Other abnormal glucose 12/16/2012  .  Essential hypertension 10/05/2011  . Dependent edema 10/05/2011  . History of breast cancer 10/05/2011  . Morbid obesity ([place]) 10/05/2011  . GERD (gastroesophageal reflux disease) 12/22/1999    Past Surgical History:  Procedure Laterality Date  . CARDIAC CATHETERIZATION  02/16/2018   No stents;[place]. Collene [place] [facility]  . FINGER SURGERY Right   . MASTECTOMY Right      OB History   No obstetric history on file.     Family History  Problem Relation Age of Onset  . Heart Problems Mother   . Hypertension Mother   . Angina Mother   . Atrial fibrillation Mother   . Heart Problems Father   . Hypertension Father   . Kidney Stones Sister   . Kidney cancer Neg Hx   . Prostate cancer Neg Hx   . Bladder Cancer Neg Hx     Social History   Tobacco Use  . Smoking status: Former Smoker    Packs/day: 0.30    Years: 4.00    Pack years: 1.20    Types: Cigarettes    Quit date: 12/22/1983    Years since quitting: 37.2  . Smokeless tobacco: Never Used  Vaping Use  . Vaping Use: Never used  Substance Use Topics  . Alcohol use: Not Currently  . Drug use: No    Home Medications Prior to Admission medications   Medication Sig Start Date End Date Taking? Authorizing Provider  amoxicillin-clavulanate (AUGMENTIN) 875-125 MG tablet Take 1 tablet by mouth every 12 (twelve) hours. 03/12/21  Yes Arnaldo Natal, MD  apixaban (ELIQUIS) 5 MG TABS tablet Take 5 mg by mouth 2 (two) times daily.   Yes [provider]  buPROPion (WELLBUTRIN XL) 300 MG 24 hr tablet Take 300 mg by mouth daily.   Yes [provider]  cholecalciferol (VITAMIN D3) 25 MCG (1000 UT) tablet Take 50,000 Units by mouth once a week.    Yes [provider]  DULoxetine (CYMBALTA) 20 MG capsule Take 20 mg by mouth daily.   Yes [provider]  ferrous sulfate 325 (65 FE) MG tablet Take 1 tablet (325 mg total) by mouth daily. 12/11/18  Yes Isla Pence, MD  fexofenadine  (ALLEGRA) 180 MG tablet Take 180 mg by mouth daily.   Yes [provider]  flecainide (TAMBOCOR) 50 MG tablet TAKE 1 TABLET BY MOUTH TWICE A DAY 01/03/21  Yes End, Harrell Gave, MD  furosemide (LASIX) 40 MG tablet Take 40 mg by mouth.   Yes [provider]  lamoTRIgine (LAMICTAL) 25 MG tablet TAKE ONE TABLET BY MOUTH DAILY FOR TWO WEEKS AND THEN INCREASE TO ONE TABLET TWICE A DAY 04/05/20  Yes [provider]  levothyroxine (SYNTHROID) 50 MCG tablet Take 50 mcg by mouth daily before breakfast.   Yes [provider]  lisinopril (PRINIVIL,ZESTRIL) 40 MG tablet Take 40 mg by mouth daily.   Yes [provider]  montelukast (SINGULAIR) 10 MG tablet Take 10 mg by mouth at bedtime.   Yes [provider]  omeprazole (PRILOSEC) 20 MG capsule Take  20 mg by mouth 2 (two) times daily before a meal.   Yes [provider]  verapamil (VERELAN PM) 120 MG 24 hr capsule Take 1 capsule (120 mg total) by mouth 2 (two) times daily. 09/03/20  Yes End, Harrell Gave, MD  albuterol (PROVENTIL HFA;VENTOLIN HFA) 108 (90 Base) MCG/ACT inhaler Inhale 2 puffs into the lungs every 6 (six) hours as needed for wheezing or shortness of breath. 04/10/17   Barry Dienes, NP  fluticasone (FLONASE) 50 MCG/ACT nasal spray Place 1 spray into both nostrils daily.    [provider]  metFORMIN (GLUCOPHAGE) 500 MG tablet Take 1,000 mg by mouth 2 (two) times daily. 07/04/20   [provider]    Allergies    Erythromycin  Review of Systems   Review of Systems  Constitutional: Negative for chills and fever.  HENT: Negative for ear pain and sore throat.   Eyes: Negative for pain and visual disturbance.  Respiratory: Negative for cough and shortness of breath.   Cardiovascular: Negative for chest pain and palpitations.  Gastrointestinal: Negative for abdominal pain and vomiting.  Genitourinary: Negative for dysuria and hematuria.  Musculoskeletal: Negative for  arthralgias and back pain.  Skin: Positive for wound. Negative for color change and rash.  Neurological: Negative for seizures, syncope and numbness.  All other systems reviewed and are negative.   Physical Exam Updated Vital Signs BP (!) 173/82 (BP Location: Right Arm)   Pulse 95   Temp 98.9 F (37.2 C) (Oral)   Resp 18   Ht 5\' 5"  (1.651 m)   Wt (!) 159.7 kg   SpO2 98%   BMI 58.58 kg/m   Physical Exam Vitals and nursing note reviewed.  HENT:     Head: Normocephalic and atraumatic.  Eyes:     General: No scleral icterus. Pulmonary:     Effort: Pulmonary effort is normal. No respiratory distress.  Musculoskeletal:     Cervical back: Normal range of motion.  Skin:    General: Skin is warm and dry.     Comments: On the right hand, there are several puncture marks on the dorsum of the second and third fingers mostly around the proximal and middle phalanges.  On the left hand, there are several superficial scratches.  No gaping wounds or surrounding erythema.  Neurological:     Mental Status: She is alert.  Psychiatric:        Mood and Affect: Mood normal.     ED Results / Procedures / Treatments   Labs (all labs ordered are listed, but only abnormal results are displayed) Labs Reviewed - No data to display  EKG None  Radiology No results found.  Procedures Procedures   Medications Ordered in ED Medications  bacitracin ointment (has no administration in time range)    ED Course  I have reviewed the triage vital signs and the nursing notes.  Pertinent labs & imaging results that were available during my care of the patient were reviewed by me and considered in my medical decision making (see chart for details).    MDM Rules/Calculators/A&P                          [name] presents with cat bites and scratches to her hands.  She is at high risk of infection based on the mechanism of injury, the location, and her underlying medical conditions.  The  wounds were dressed here in the emergency department, and she  was prescribed Augmentin.  She was given careful return precautions.  She has a cat in her possession and plans to contact animal control.  Rabies immunoglobulin and immunization are not currently indicated.  She was given very careful return precautions.  Final Clinical Impression(s) / ED Diagnoses Final diagnoses:  Cat bite, initial encounter    Rx / DC Orders ED Discharge Orders         Ordered    amoxicillin-clavulanate (AUGMENTIN) 875-125 MG tablet  Every 12 hours        03/12/21 1036           Arnaldo Natal, MD 03/12/21 1041

## 2021-03-12 NOTE — Discharge Instructions (Signed)
Please call animal control immediately and to report the incident.

## 2021-05-13 ENCOUNTER — Telehealth: Payer: Self-pay | Admitting: Internal Medicine

## 2021-05-13 NOTE — Telephone Encounter (Signed)
   Patient Name:  Vashon Arch  DOB:  1961-11-22  MRN:  [id number]   Primary Cardiologist: Nelva Bush, MD  Chart reviewed as part of pre-operative protocol coverage.   Simple dental extractions are considered low risk procedures per guidelines and generally do not require any specific cardiac clearance. It is also generally accepted that for simple extractions and dental cleanings, there is no need to interrupt blood thinner/anticoagulation therapy.  Recommendations  Proceed with single dental extraction.  Continue Apixaban (Eliquis) without interruption.   SBE prophylaxis is not required for the patient from a cardiac standpoint.  Please call with questions.  Richardson Dopp, PA-C 05/13/2021, 4:39 PM

## 2021-05-13 NOTE — Telephone Encounter (Signed)
Notes faxed to surgeon. This phone note will be removed from the preop pool. Richardson Dopp, PA-C  05/13/2021 4:43 PM

## 2021-05-13 NOTE — Telephone Encounter (Signed)
   [name] Pre-operative Risk Assessment    Patient Name: [name]  DOB: 1961-09-15  MRN: [id number]   HEARTCARE STAFF: - Please ensure there is not already an duplicate clearance open for this procedure. - Under Visit Info/Reason for Call, type in Other and utilize the format Clearance MM/DD/YY or Clearance TBD. Do not use dashes or single digits. - If request is for dental extraction, please clarify the # of teeth to be extracted.  Request for surgical clearance:  1. What type of surgery is being performed? 1 Extraction   2. When is this surgery scheduled? TBD   3. What type of clearance is required (medical clearance vs. Pharmacy clearance to hold med vs. Both)? both  4. Are there any medications that need to be held prior to surgery and how long? Eliquis instructions  5. Practice name and name of physician performing surgery? [facility] - Dr Daneen Schick  6. What is the office phone number? [phone]   7.   What is the office fax number? [phone]  8.   Anesthesia type (None, local, MAC, general) ? Not listed    Ace Gins 05/13/2021, 4:19 PM  _________________________________________________________________   (provider comments below)

## 2021-05-21 ENCOUNTER — Ambulatory Visit: Payer: 59 | Admitting: Internal Medicine

## 2021-06-10 ENCOUNTER — Other Ambulatory Visit: Payer: Self-pay | Admitting: Internal Medicine

## 2021-06-16 NOTE — Progress Notes (Signed)
Follow-up Outpatient Visit Date: 06/18/2021  Primary Care Provider: Hayden Rasmussen, MD [place] [address]  Chief Complaint: Follow-up HFpEF and atrial fibrillation  HPI:  [name] is a 60 y.o. female with history of chronic HFpEF, paroxysmal atrial fibrillation, obstructive sleep apnea, hypertension, type 2 diabetes mellitus, and breast cancer, who presents for follow-up of HFpEF and atrial fibrillation.  I last saw her in 11/2020, which time [name] was feeling quite well.  Chronic exertional dyspnea was still present but slightly better than in the past.  Chronic lower extremity edema was also stable.  Today, [name] reports that she feels relatively well.  Her biggest limitation right now is pain involving the left knee.  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  This is made it difficult for her to walk or exercise much.  She recently learned about the medically supervised fitness program at [facility] and would like to learn more about that.  From a heart standpoint, [name] is feeling well.  She denies chest pain, shortness of breath, palpitations, and lightheadedness.  Chronic lower extremity edema is stable.  She feels like her weight is gradually improving since she cut down on sodas and also transitioned to a gluten-free diet.  --------------------------------------------------------------------------------------------------  Cardiovascular History & Procedures: Cardiovascular Problems: Paroxysmal atrial fibrillation Chronic HFpEF   Risk Factors: Hypertension, diabetes mellitus, and morbid obesity   Cath/PCI: LHC (02/16/2018): Mild luminal irregularities involving the LAD, LCx, and RCA.  LVEDP 34 mmHg.   CV Surgery: None   EP Procedures and Devices: None   Non-Invasive Evaluation(s): Myocardial PET/CT (01/07/2018): Small in size, moderate in severity, reversible anterior defect that may  represent ischemia.  Small to moderate in size, moderate in severity, apical lateral and apical defect that is partially reversible and may represent ischemia.  LVEF 61%. TTE (11/09/2017): Normal LV size and function.  LVEF 60-65% with normal diastolic function.  Normal RV size and function.  Mitral annular calcification present.  Recent CV Pertinent Labs: Lab Results  Component Value Date   K 4.1 03/26/2020   BUN 15 03/26/2020   CREATININE 0.70 07/31/2020    Past medical and surgical history were reviewed and updated in EPIC.  Current Meds  Medication Sig   albuterol (PROVENTIL HFA;VENTOLIN HFA) 108 (90 Base) MCG/ACT inhaler Inhale 2 puffs into the lungs every 6 (six) hours as needed for wheezing or shortness of breath.   apixaban (ELIQUIS) 5 MG TABS tablet Take 5 mg by mouth 2 (two) times daily.   buPROPion (WELLBUTRIN XL) 300 MG 24 hr tablet Take 300 mg by mouth daily.   DULoxetine (CYMBALTA) 20 MG capsule Take 40 mg by mouth daily.   Ergocalciferol (VITAMIN D2 PO) Take 4,000 Int'l Units by mouth daily.   fexofenadine (ALLEGRA) 180 MG tablet Take 180 mg by mouth daily.   flecainide (TAMBOCOR) 50 MG tablet TAKE 1 TABLET BY MOUTH TWICE A DAY   fluticasone (FLONASE) 50 MCG/ACT nasal spray Place 1 spray into both nostrils daily.   furosemide (LASIX) 40 MG tablet Take 40 mg by mouth daily.   lamoTRIgine (LAMICTAL) 25 MG tablet Take 25 mg by mouth 2 (two) times daily.   levothyroxine (SYNTHROID) 50 MCG tablet Take 50 mcg by mouth daily before breakfast.   lisinopril (PRINIVIL,ZESTRIL) 40 MG tablet Take 40 mg by mouth daily.   metFORMIN (GLUCOPHAGE) 500 MG tablet Take 1,000 mg by mouth 2 (two) times daily.  montelukast (SINGULAIR) 10 MG tablet Take 10 mg by mouth at bedtime.   omeprazole (PRILOSEC) 20 MG capsule Take 20 mg by mouth 2 (two) times daily before a meal.   Prenatal Vit-Fe Fumarate-FA (PRENATAL MULTIVITAMIN) TABS tablet Take 1 tablet by mouth daily.   verapamil (VERELAN PM) 120  MG 24 hr capsule TAKE 1 CAPSULE (120 MG TOTAL) BY MOUTH 2 (TWO) TIMES DAILY.    Allergies: Erythromycin  Social History   Tobacco Use   Smoking status: Former    Packs/day: 0.30    Years: 4.00    Pack years: 1.20    Types: Cigarettes    Quit date: 12/22/1983    Years since quitting: 37.5   Smokeless tobacco: Never  Vaping Use   Vaping Use: Never used  Substance Use Topics   Alcohol use: Not Currently   Drug use: No    Family History  Problem Relation Age of Onset   Heart Problems Mother    Hypertension Mother    Angina Mother    Atrial fibrillation Mother    Heart Problems Father    Hypertension Father    Kidney Stones Sister    Kidney cancer Neg Hx    Prostate cancer Neg Hx    Bladder Cancer Neg Hx     Review of Systems: A 12-system review of systems was performed and was negative except as noted in the HPI.  --------------------------------------------------------------------------------------------------  Physical Exam: BP 140/66 (BP Location: Left Arm, Patient Position: Sitting, Cuff Size: Large)   Pulse 81   Ht 5\' 5"  (1.651 m)   Wt (!) 357 lb (161.9 kg)   SpO2 96%   BMI 59.41 kg/m   General:  NAD. Neck: JVP difficult to assess due to body habitus. Lungs: Clear to auscultation bilaterally without wheezes or crackles. Heart: Regular rate and rhythm without murmurs, rubs, or gallops. Abdomen: Soft, nontender, nondistended. Extremities: Trace pretibial edema bilaterally.  EKG: Normal sinus rhythm without abnormality.  No significant change since 11/22/2020.  Lab Results  Component Value Date   WBC 10.9 (H) 03/26/2020   HGB 11.9 (L) 03/26/2020   HCT 38.7 03/26/2020   MCV 90.2 03/26/2020   PLT 330 03/26/2020    Lab Results  Component Value Date   NA 139 03/26/2020   K 4.1 03/26/2020   CL 97 (L) 03/26/2020   CO2 28 03/26/2020   BUN 15 03/26/2020   CREATININE 0.70 07/31/2020   GLUCOSE 139 (H) 03/26/2020   ALT 14 06/04/2009    No results found  for: CHOL, HDL, LDLCALC, LDLDIRECT, TRIG, CHOLHDL  --------------------------------------------------------------------------------------------------  ASSESSMENT AND PLAN: Chronic HFpEF: Overall, [name] is stable with trace edema on examination today and NYHA class II symptoms.  Her functional capacity is mostly limited by her orthopedic issues.  We will work on improved blood pressure control but otherwise defer additional interventions at this time; continue furosemide 40 mg daily.  [name] reports recent labs through Dr. Dennie Fetters office.  We are awaiting a copy of her labs.  Hypertension: Blood pressure suboptimally controlled today.  We have agreed to switch from verapamil 120 mg twice daily to 360 mg daily.  Continue lisinopril 40 mg daily.  Paroxysmal atrial fibrillation: No palpitations reported with EKG today again showing sinus rhythm.  We will plan to continue with apixaban 5 mg twice daily and flecainide 50 mg twice daily.  Due to suboptimal blood pressure control and resting heart rate near 80 bpm today, we have agreed to increase verapamil to  360 mg daily.  Morbid obesity: BMI remains near 60.  [name] is very interested in losing weight through diet and exercise.  I have encouraged her to reach out to the [facility] for a consultation.  We will also refer her to the [facility] fitness center in [place].  I have encouraged her to consult with her PCP and/or an orthopedist regarding her chronic knee pain and concern for recurrent patellar dislocations.  Follow-up: Return to clinic in 6 months.  Nelva Bush, MD 06/18/2021 11:13 AM

## 2021-06-18 ENCOUNTER — Other Ambulatory Visit: Payer: Self-pay

## 2021-06-18 ENCOUNTER — Ambulatory Visit (INDEPENDENT_AMBULATORY_CARE_PROVIDER_SITE_OTHER): Payer: 59 | Admitting: Internal Medicine

## 2021-06-18 ENCOUNTER — Encounter: Payer: Self-pay | Admitting: Internal Medicine

## 2021-06-18 VITALS — BP 140/66 | HR 81 | Ht 65.0 in | Wt 357.0 lb

## 2021-06-18 DIAGNOSIS — I480 Paroxysmal atrial fibrillation: Secondary | ICD-10-CM | POA: Diagnosis not present

## 2021-06-18 DIAGNOSIS — I5032 Chronic diastolic (congestive) heart failure: Secondary | ICD-10-CM

## 2021-06-18 DIAGNOSIS — I10 Essential (primary) hypertension: Secondary | ICD-10-CM | POA: Diagnosis not present

## 2021-06-18 MED ORDER — VERAPAMIL HCL ER 360 MG PO CP24: 360.0000 mg | ORAL_CAPSULE | Freq: Every day | ORAL | 3 refills | Status: DC

## 2021-06-18 NOTE — Patient Instructions (Signed)
Medication Instructions:   Your physician has recommended you make the following change in your medication:   CHANGE Verapamil to 360mg  DAILY    - Skip your current dose this evening   - Take 3 tablets of current dose tomorrow morning for total of 360mg    - May continue to take 3 tablets daily of current dose until receive new Rx for 360mg  tablet   *If you need a refill on your cardiac medications before your next appointment, please call your pharmacy*   Lab Work:  None ordered  Testing/Procedures:  None ordered   Follow-Up: At [facility], you and your health needs are our priority.  As part of our continuing mission to provide you with exceptional heart care, we have created designated Provider Care Teams.  These Care Teams include your primary Cardiologist (physician) and Advanced Practice Providers (APPs -  Physician Assistants and Nurse Practitioners) who all work together to provide you with the care you need, when you need it.  We recommend signing up for the patient portal called "MyChart".  Sign up information is provided on this After Visit Summary.  MyChart is used to connect with patients for Virtual Visits (Telemedicine).  Patients are able to view lab/test results, encounter notes, upcoming appointments, etc.  Non-urgent messages can be sent to your provider as well.   To learn more about what you can do with MyChart, go to [email or web address].    Your next appointment:   6 month(s)  The format for your next appointment:   In Person  Provider:   You may see Nelva Bush, MD or one of the following Advanced Practice Providers on your designated Care Team:   Murray Hodgkins, NP Christell Faith, PA-C Marrianne Mood, PA-C Cadence Kathlen Mody, [place] Laurann [place], NP   Other Instructions  It is recommended that you contact [facility] in [place] when you are able.  Their number is [phone]

## 2021-06-19 ENCOUNTER — Encounter: Payer: Self-pay | Admitting: Internal Medicine

## 2021-06-20 ENCOUNTER — Telehealth: Payer: Self-pay

## 2021-06-20 NOTE — Telephone Encounter (Signed)
Per fax from [facility] [phone], they are unable to order verapamil 360mg  capsules anymore. Spoke with patient and advised her to contact her other local pharmacies to see who carries this drug and to call back to let us know where to send her prescription. Patient expressed gratitude for the call.

## 2021-06-25 MED ORDER — VERAPAMIL HCL ER 180 MG PO TBCR: 360.0000 mg | EXTENDED_RELEASE_TABLET | Freq: Every day | ORAL | 3 refills | Status: DC

## 2021-07-04 ENCOUNTER — Other Ambulatory Visit: Payer: Self-pay | Admitting: Internal Medicine

## 2021-09-03 ENCOUNTER — Other Ambulatory Visit: Payer: Self-pay | Admitting: Internal Medicine

## 2021-10-01 ENCOUNTER — Other Ambulatory Visit: Payer: Self-pay | Admitting: Gastroenterology

## 2021-10-01 ENCOUNTER — Telehealth: Payer: Self-pay | Admitting: Internal Medicine

## 2021-10-01 NOTE — Telephone Encounter (Signed)
   [facility] Pre-operative Risk Assessment    Patient Name: Jenaveve Fenstermaker  DOB: June 13, 1961 MRN: [id number]  HEARTCARE STAFF:  - IMPORTANT!!!!!! Under Visit Info/Reason for Call, type in Other and utilize the format Clearance MM/DD/YY or Clearance TBD. Do not use dashes or single digits. - Please review there is not already an duplicate clearance open for this procedure. - If request is for dental extraction, please clarify the # of teeth to be extracted. - If the patient is currently at the dentist's office, call Pre-Op Callback Staff (MA/nurse) to input urgent request.  - If the patient is not currently in the dentist office, please route to the Pre-Op pool.  Request for surgical clearance:  What type of surgery is being performed? Colonoscopy   When is this surgery scheduled? 11/28/21  What type of clearance is required (medical clearance vs. Pharmacy clearance to hold med vs. Both)? both  Are there any medications that need to be held prior to surgery and how long? Eliquis instructions   Practice name and name of physician performing surgery? [facility] - Dr Benson Norway   What is the office phone number? [phone]   7.   What is the office fax number? [phone]  8.   Anesthesia type (None, local, MAC, general) ? Propofol    Ace Gins 10/01/2021, 4:28 PM  _________________________________________________________________   (provider comments below)

## 2021-10-03 NOTE — Telephone Encounter (Signed)
Patient with diagnosis of afib on Eliquis for anticoagulation.    Procedure: colonoscopy Date of procedure: 11/28/21  CHA2DS2-VASc Score = 4  This indicates a 4.8% annual risk of stroke. The patient's score is based upon: CHF History: 1 HTN History: 1 Diabetes History: 1 Stroke History: 0 Vascular Disease History: 0 Age Score: 0 Gender Score: 1   CrCl >140mL/min Platelet count 330K  Per office protocol, patient can hold Eliquis for 1-2 days prior to procedure.

## 2021-10-03 NOTE — Telephone Encounter (Signed)
Left message for patient to call back at 1305 on 10/03/2021.  Patient needs call back.

## 2021-10-09 NOTE — Telephone Encounter (Signed)
   Primary Cardiologist: Nelva Bush, MD  Chart reviewed as part of pre-operative protocol coverage. Given past medical history and time since last visit, based on ACC/AHA guidelines, [name] would be at acceptable risk for the planned procedure without further cardiovascular testing.   Patient with diagnosis of afib on Eliquis for anticoagulation.     Procedure: colonoscopy Date of procedure: 11/28/21   CHA2DS2-VASc Score = 4  This indicates a 4.8% annual risk of stroke. The patient's score is based upon: CHF History: 1 HTN History: 1 Diabetes History: 1 Stroke History: 0 Vascular Disease History: 0 Age Score: 0 Gender Score: 1    CrCl >122mL/min Platelet count 330K   Per office protocol, patient can hold Eliquis for 1-2 days prior to procedure.  Patient was advised that if she develops new symptoms prior to surgery to contact our office to arrange a follow-up appointment.  She verbalized understanding.  I will route this recommendation to the requesting party via Epic fax function and remove from pre-op pool.  Please call with questions.  Jossie Ng. Anaelle Dunton NP-C    10/09/2021, 2:47 PM [name] [address] Office [phone] Fax [phone]

## 2021-11-18 ENCOUNTER — Encounter (HOSPITAL_COMMUNITY): Payer: Self-pay | Admitting: Gastroenterology

## 2021-11-27 NOTE — Anesthesia Preprocedure Evaluation (Addendum)
Anesthesia Evaluation  Patient identified by MRN, date of birth, ID band Patient awake    Reviewed: Allergy & Precautions, NPO status , Patient's Chart, lab work & pertinent test results  History of Anesthesia Complications Negative for: history of anesthetic complications  Airway Mallampati: II  TM Distance: >3 FB Neck ROM: Full    Dental no notable dental hx.    Pulmonary asthma , sleep apnea and Continuous Positive Airway Pressure Ventilation , former smoker,    Pulmonary exam normal        Cardiovascular hypertension, Normal cardiovascular exam+ dysrhythmias (on Eliquis) Atrial Fibrillation      Neuro/Psych Anxiety Depression negative neurological ROS     GI/Hepatic Neg liver ROS, GERD  ,  Endo/Other  diabetes, Type 2, Oral Hypoglycemic AgentsHypothyroidism Morbid obesity (BMI 60)  Renal/GU negative Renal ROS  negative genitourinary   Musculoskeletal negative musculoskeletal ROS (+)   Abdominal   Peds  Hematology negative hematology ROS (+)   Anesthesia Other Findings Day of surgery medications reviewed with patient.  Reproductive/Obstetrics negative OB ROS                            Anesthesia Physical Anesthesia Plan  ASA: 3  Anesthesia Plan: MAC   Post-op Pain Management: Minimal or no pain anticipated   Induction:   PONV Risk Score and Plan: 2 and Treatment may vary due to age or medical condition and Propofol infusion  Airway Management Planned: Natural Airway and Nasal Cannula  Additional Equipment: None  Intra-op Plan:   Post-operative Plan:   Informed Consent: I have reviewed the patients History and Physical, chart, labs and discussed the procedure including the risks, benefits and alternatives for the proposed anesthesia with the patient or authorized representative who has indicated his/her understanding and acceptance.       Plan Discussed with:  CRNA  Anesthesia Plan Comments:        Anesthesia Quick Evaluation

## 2021-11-28 ENCOUNTER — Ambulatory Visit (HOSPITAL_COMMUNITY): Payer: 59 | Admitting: Anesthesiology

## 2021-11-28 ENCOUNTER — Ambulatory Visit (HOSPITAL_COMMUNITY)
Admission: RE | Admit: 2021-11-28 | Discharge: 2021-11-28 | Disposition: A | Discharge status: Home / Self Care | Payer: 59 | Source: Ambulatory Visit | Attending: Gastroenterology | Admitting: Gastroenterology

## 2021-11-28 ENCOUNTER — Encounter (HOSPITAL_COMMUNITY): Payer: Self-pay | Admitting: Gastroenterology

## 2021-11-28 ENCOUNTER — Other Ambulatory Visit: Payer: Self-pay

## 2021-11-28 ENCOUNTER — Encounter (HOSPITAL_COMMUNITY)
Admission: RE | Disposition: A | Discharge status: Home / Self Care | Payer: Self-pay | Source: Ambulatory Visit | Attending: Gastroenterology

## 2021-11-28 DIAGNOSIS — Z87891 Personal history of nicotine dependence: Secondary | ICD-10-CM | POA: Diagnosis not present

## 2021-11-28 DIAGNOSIS — Z1211 Encounter for screening for malignant neoplasm of colon: Secondary | ICD-10-CM | POA: Insufficient documentation

## 2021-11-28 DIAGNOSIS — G4730 Sleep apnea, unspecified: Secondary | ICD-10-CM | POA: Diagnosis not present

## 2021-11-28 DIAGNOSIS — E119 Type 2 diabetes mellitus without complications: Secondary | ICD-10-CM | POA: Diagnosis not present

## 2021-11-28 DIAGNOSIS — Z7901 Long term (current) use of anticoagulants: Secondary | ICD-10-CM | POA: Diagnosis not present

## 2021-11-28 DIAGNOSIS — K219 Gastro-esophageal reflux disease without esophagitis: Secondary | ICD-10-CM | POA: Insufficient documentation

## 2021-11-28 DIAGNOSIS — I10 Essential (primary) hypertension: Secondary | ICD-10-CM | POA: Insufficient documentation

## 2021-11-28 DIAGNOSIS — Z6841 Body Mass Index (BMI) 40.0 and over, adult: Secondary | ICD-10-CM | POA: Diagnosis not present

## 2021-11-28 DIAGNOSIS — J45909 Unspecified asthma, uncomplicated: Secondary | ICD-10-CM | POA: Insufficient documentation

## 2021-11-28 DIAGNOSIS — I4891 Unspecified atrial fibrillation: Secondary | ICD-10-CM | POA: Diagnosis not present

## 2021-11-28 DIAGNOSIS — Z7984 Long term (current) use of oral hypoglycemic drugs: Secondary | ICD-10-CM | POA: Insufficient documentation

## 2021-11-28 HISTORY — PX: COLONOSCOPY WITH PROPOFOL: SHX5780

## 2021-11-28 LAB — GLUCOSE, CAPILLARY: Glucose-Capillary: 109 mg/dL — ABNORMAL HIGH (ref 70–99)

## 2021-11-28 SURGERY — COLONOSCOPY WITH PROPOFOL
Anesthesia: Monitor Anesthesia Care

## 2021-11-28 MED ORDER — LIDOCAINE 2% (20 MG/ML) 5 ML SYRINGE
INTRAMUSCULAR | Status: DC | PRN
  Administered 2021-11-28: 40 mg via INTRAVENOUS

## 2021-11-28 MED ORDER — PROPOFOL 500 MG/50ML IV EMUL
INTRAVENOUS | Status: DC | PRN
  Administered 2021-11-28: 70 ug/kg/min via INTRAVENOUS

## 2021-11-28 MED ORDER — LACTATED RINGERS IV SOLN
INTRAVENOUS | Status: DC
  Administered 2021-11-28: 1000 mL via INTRAVENOUS

## 2021-11-28 MED ORDER — PROPOFOL 10 MG/ML IV BOLUS
INTRAVENOUS | Status: DC | PRN
  Administered 2021-11-28: 50 mg via INTRAVENOUS
  Administered 2021-11-28: 30 mg via INTRAVENOUS

## 2021-11-28 SURGICAL SUPPLY — 21 items

## 2021-11-28 NOTE — Anesthesia Postprocedure Evaluation (Signed)
Anesthesia Post Note  Patient: [name]  Procedure(s) Performed: COLONOSCOPY WITH PROPOFOL     Patient location during evaluation: PACU Anesthesia Type: MAC Level of consciousness: awake and alert and oriented Pain management: pain level controlled Vital Signs Assessment: post-procedure vital signs reviewed and stable Respiratory status: spontaneous breathing, nonlabored ventilation and respiratory function stable Cardiovascular status: blood pressure returned to baseline Postop Assessment: no apparent nausea or vomiting Anesthetic complications: no   No notable events documented.  Last Vitals:  Vitals:   11/28/21 0914 11/28/21 0920  BP: (!) 157/74 (!) 164/76  Pulse: 69 68  Resp: 16 (!) 22  Temp:    SpO2: 100% 100%    Last Pain:  Vitals:   11/28/21 0902  TempSrc: Oral  PainSc:                  [name]

## 2021-11-28 NOTE — Transfer of Care (Signed)
Immediate Anesthesia Transfer of Care Note  Patient: [name]  Procedure(s) Performed: COLONOSCOPY WITH PROPOFOL  Patient Location: PACU  Anesthesia Type:MAC  Level of Consciousness: awake, alert , oriented and patient cooperative  Airway & Oxygen Therapy: Patient Spontanous Breathing and Patient connected to face mask oxygen  Post-op Assessment: Report given to RN and Post -op Vital signs reviewed and stable  Post vital signs: Reviewed and stable  Last Vitals:  Vitals Value Taken Time  BP 187/86 11/28/21 0902  Temp 36.5 C 11/28/21 0902  Pulse 72 11/28/21 0906  Resp 18 11/28/21 0906  SpO2 100 % 11/28/21 0906  Vitals shown include unvalidated device data.  Last Pain:  Vitals:   11/28/21 0902  TempSrc: Oral  PainSc:          Complications: No notable events documented.

## 2021-11-28 NOTE — Op Note (Signed)
[facility] Patient Name: [name] Procedure Date: 11/28/2021 MRN: [id number] Attending MD: Carol Ada , MD Date of Birth: 1961-01-27 CSN: [id number] Age: 60 Admit Type: Outpatient Procedure:                Colonoscopy Indications:              Screening for colorectal malignant neoplasm Providers:                Carol Ada, MD, Grace Isaac, RN, Frazier Richards,                            Technician Referring MD:              Medicines:                 Complications:            No immediate complications. Estimated Blood Loss:     Estimated blood loss: none. Procedure:                Pre-Anesthesia Assessment:                           - Prior to the procedure, a History and Physical                            was performed, and patient medications and                            allergies were reviewed. The patient's tolerance of                            previous anesthesia was also reviewed. The risks                            and benefits of the procedure and the sedation                            options and risks were discussed with the patient.                            All questions were answered, and informed consent                            was obtained. Prior Anticoagulants: The patient has                            taken Eliquis (apixaban), last dose was 2 days                            prior to procedure. ASA Grade Assessment: III - A                            patient with severe systemic disease. After                            reviewing  the risks and benefits, the patient was                            deemed in satisfactory condition to undergo the                            procedure.                           - Sedation was administered by an anesthesia                            professional. Deep sedation was attained.                           After obtaining informed consent, the colonoscope                            was passed  under direct vision. Throughout the                            procedure, the patient's blood pressure, pulse, and                            oxygen saturations were monitored continuously. The                            CF-HQ190L ([id number]) Olympus colonoscope was                            introduced through the anus and advanced to the the                            terminal ileum. The colonoscopy was technically                            difficult and complex due to poor bowel prep.                            Successful completion of the procedure was aided by                            lavage. The patient tolerated the procedure well.                            The quality of the bowel preparation was evaluated                            using the BBPS [facility] Bowel Preparation Scale)                            with scores of: Right Colon = 2 (minor amount of  residual staining, small fragments of stool and/or                            opaque liquid, but mucosa seen well), Transverse                            Colon = 2 (minor amount of residual staining, small                            fragments of stool and/or opaque liquid, but mucosa                            seen well) and Left Colon = 2 (minor amount of                            residual staining, small fragments of stool and/or                            opaque liquid, but mucosa seen well). The total                            BBPS score equals 6. The quality of the bowel                            preparation was good. The ileocecal valve,                            appendiceal orifice, and rectum were photographed. Scope In: 8:32:07 AM Scope Out: 8:56:07 AM Scope Withdrawal Time: 0 hours 20 minutes 50 seconds  Total Procedure Duration: 0 hours 24 minutes 0 seconds  Findings:      The entire examined colon appeared normal. One and a half liters of       lavage was required to obtain good views  of the mucosa. Impression:               - The entire examined colon is normal.                           - No specimens collected. Moderate Sedation:      Not Applicable - Patient had care per Anesthesia. Recommendation:           - Patient has a contact number available for                            emergencies. The signs and symptoms of potential                            delayed complications were discussed with the                            patient. Return to normal activities tomorrow.                            Written discharge instructions were  provided to the                            patient.                           - Resume previous diet.                           - Continue present medications.                           - Repeat colonoscopy in 5 years for surveillance.                            Becuase of the prep, small lesions may be missed.                            TWO DAY PREP.                           - Resume anticoagulation Procedure Code(s):        --- Professional ---                           [phone], Colonoscopy, flexible; diagnostic, including                            collection of specimen(s) by brushing or washing,                            when performed (separate procedure) Diagnosis Code(s):        --- Professional ---                           Z12.11, Encounter for screening for malignant                            neoplasm of colon CPT copyright 2019 [employer]. All rights reserved. The codes documented in this report are preliminary and upon coder review may  be revised to meet current compliance requirements. Carol Ada, MD Carol Ada, MD 11/28/2021 9:04:25 AM This report has been signed electronically. Number of Addenda: 0

## 2021-11-28 NOTE — H&P (Signed)
[name] HPI: Her last colonoscopy was in [place] and a small polyp was removed.  Her procedure was approximately 7 years ago.  She denies any issues with her SOB.  The patient does not have any issues with chest pain or MI.  Her afib is currently in sinus and she takes Eliquis.   Past Medical History:  Diagnosis Date   Anxiety 08/23/2014   Asthma    Atrial fibrillation ([place]) 06/2014   Breast cancer ([place])    Chronic heart failure with preserved ejection fraction (HFpEF) ([place]) 04/21/2019   Cough 09/06/2018   Current moderate episode of major depressive disorder without prior episode ([place]) 12/31/2017   Dependent edema 10/05/2011   Dyspnea and respiratory abnormality 09/06/2018   GE reflux    GERD (gastroesophageal reflux disease) 12/22/1999   Formatting of this note might be different from the original. 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 Collene [place]ares[place] [facility]   FINGER SURGERY Right    MASTECTOMY Right     Family History  Problem Relation Age of Onset   Heart Problems Mother    Hypertension Mother    Angina Mother    Atrial fibrillation Mother    Heart Problems Father     Hypertension Father    Kidney Stones Sister    Kidney cancer Neg Hx    Prostate cancer Neg Hx    Bladder Cancer Neg Hx     Social History:  reports that she quit smoking about 37 years ago. Her smoking use included cigarettes. She has a 1.20 pack-year smoking history. She has never used smokeless tobacco. She reports that she does not currently use alcohol. She reports that she does not use drugs.  Allergies:  Allergies  Allergen Reactions   Erythromycin Diarrhea    Medications: Scheduled: Continuous:  No results found for this or any previous visit (from the past 24 hour(s)).   No results found.  ROS:  As stated above in the HPI otherwise negative.  Blood pressure (!) 176/91, pulse 88, temperature 98.3 F (36.8 C), temperature source Oral, resp. rate (!) 21, height 5\' 5"  (1.651 m), weight (!) 164.2 kg, SpO2 100 %.    PE: Gen: NAD, Alert and Oriented HEENT:  [place]/AT, EOMI Neck: Supple, no LAD Lungs: CTA Bilaterally CV: RRR without M/G/R ABD: Soft, NTND, morbidly obese, +BS Ext: No C/C/E  Assessment/Plan: 1) Screening colonoscopy.  [name] D 11/28/2021, 7:58 AM

## 2021-12-02 ENCOUNTER — Encounter (HOSPITAL_COMMUNITY): Payer: Self-pay | Admitting: Gastroenterology

## 2021-12-04 ENCOUNTER — Other Ambulatory Visit: Payer: Self-pay | Admitting: Internal Medicine

## 2021-12-28 ENCOUNTER — Encounter: Payer: Self-pay | Admitting: Internal Medicine

## 2022-03-03 ENCOUNTER — Other Ambulatory Visit: Payer: Self-pay | Admitting: Internal Medicine

## 2022-03-03 NOTE — Telephone Encounter (Signed)
Patient is overdue for 6 months follow up. She is on Flecainide, an antiarrythmic medication. She must be followed every 6 months. ? ?Please call to schedule to get more refills.  ? ?Thank you ?

## 2022-03-17 ENCOUNTER — Encounter: Payer: Self-pay | Admitting: Nurse Practitioner

## 2022-03-17 ENCOUNTER — Other Ambulatory Visit: Payer: Self-pay

## 2022-03-17 ENCOUNTER — Ambulatory Visit: Payer: 59 | Admitting: Nurse Practitioner

## 2022-03-17 VITALS — BP 136/72 | HR 87 | Ht 65.0 in | Wt 358.0 lb

## 2022-03-17 DIAGNOSIS — I10 Essential (primary) hypertension: Secondary | ICD-10-CM | POA: Diagnosis not present

## 2022-03-17 DIAGNOSIS — I5032 Chronic diastolic (congestive) heart failure: Secondary | ICD-10-CM | POA: Diagnosis not present

## 2022-03-17 DIAGNOSIS — I480 Paroxysmal atrial fibrillation: Secondary | ICD-10-CM | POA: Diagnosis not present

## 2022-03-17 MED ORDER — APIXABAN 5 MG PO TABS: 5.0000 mg | ORAL_TABLET | Freq: Two times a day (BID) | ORAL | 11 refills | Status: DC

## 2022-03-17 MED ORDER — FLECAINIDE ACETATE 50 MG PO TABS: 50.0000 mg | ORAL_TABLET | Freq: Two times a day (BID) | ORAL | 3 refills | Status: DC

## 2022-03-17 NOTE — Patient Instructions (Signed)
Medication Instructions:  ?No changes at this time.  ? ?*If you need a refill on your cardiac medications before your next appointment, please call your pharmacy* ? ? ?Lab Work: ?None ? ?If you have labs (blood work) drawn today and your tests are completely normal, you will receive your results only by: ?MyChart Message (if you have MyChart) OR ?A paper copy in the mail ?If you have any lab test that is abnormal or we need to change your treatment, we will call you to review the results. ? ? ?Testing/Procedures: ?None ? ? ?Follow-Up: ?At [facility], you and your health needs are our priority.  As part of our continuing mission to provide you with exceptional heart care, we have created designated Provider Care Teams.  These Care Teams include your primary Cardiologist (physician) and Advanced Practice Providers (APPs -  Physician Assistants and Nurse Practitioners) who all work together to provide you with the care you need, when you need it. ? ? ?Your next appointment:   ?6 month(s) ? ?The format for your next appointment:   ?In Person ? ?Provider:   ?Nelva Bush, MD or Murray Hodgkins, NP ?

## 2022-03-17 NOTE — Progress Notes (Signed)
? ? ?Office Visit  ?  ?Patient Name: [name] ?Date of Encounter: 03/17/2022 ? ?Primary Care Provider:  Hayden Rasmussen, MD ?Primary Cardiologist:  Nichole Bush, MD ? ?Chief Complaint  ?  ?61 year old female with a history of chronic HFpEF, nonobstructive CAD, chronic lower extremity edema, paroxysmal atrial fibrillation, hypertension, obstructive sleep apnea, diabetes, breast cancer, and obesity, who presents for follow-up related to HFpEF. ? ?Past Medical History  ?  ?Past Medical History:  ?Diagnosis Date  ? Anxiety 08/23/2014  ? Asthma   ? Atrial fibrillation ([place]) 06/2014  ? Breast cancer ([place])   ? Chronic heart failure with preserved ejection fraction (HFpEF) ([place])   ? a. 02/2000 MUGA: EF 68%; b. 06/2005 Echo: EF 55-65%; c. 06/2017 Echo: EF 60-65%; d. 08/2020 Echo: EF 60-65%, no rwma, nl RV fxn. Mild BAE. Mild MR.  ? Cough 09/06/2018  ? Current moderate episode of major depressive disorder without prior episode ([place]) 12/31/2017  ? Dependent edema 10/05/2011  ? Dyspnea and respiratory abnormality 09/06/2018  ? GE reflux   ? GERD (gastroesophageal reflux disease) 12/22/1999  ? Formatting of this note might be different from the original. Controlled by OTC meds  ? History of breast cancer 10/05/2011  ? Hypertension 10/05/2011  ? Long term current use of anticoagulant therapy 08/29/2014  ? Mild persistent asthma without complication 32/99/2426  ? Morbid obesity ([place]) 10/05/2011  ? Morbid obesity with BMI of 60.0-69.9, adult ([place]) 09/06/2018  ? Non-obstructive CAD (coronary artery disease)   ? a. 12/2017 Lexiscan PET/CT: mid anterior, apical lateral, and apical ischemia; b. 01/2018 Cath: LM nl, LAD & LCX 10-30% diff dzs throughout, RCA min irregs (<10%)-->Med rx.  ? Obstructive sleep apnea, adult 09/06/2018  ? Paroxysmal atrial fibrillation ([place]) 04/21/2019  ? Sleep apnea 06/03/2020  ? 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/2014  ? Type 2 diabetes mellitus without complication, without long-term current use of  insulin ([place]) 04/21/2019  ? Urinary bladder incontinence 06/13/2013  ? ?Past Surgical History:  ?Procedure Laterality Date  ? CARDIAC CATHETERIZATION  02/16/2018  ? No stents;[name] [facility]  ? COLONOSCOPY WITH PROPOFOL N/A 11/28/2021  ? Procedure: COLONOSCOPY WITH PROPOFOL;  Surgeon: Nichole Ada, MD;  Location: [facility];  Service: Endoscopy;  Laterality: N/A;  ? FINGER SURGERY Right   ? MASTECTOMY Right   ? ? ?Allergies ? ?Allergies  ?Allergen Reactions  ? Erythromycin Diarrhea  ? ? ?History of Present Illness  ?  ?61 year old female with the above past medical history including chronic HFpEF, chronic lower extremity edema, nonobstructive CAD, paroxysmal atrial fibrillation, hypertension, sleep apnea, diabetes, breast cancer, and obesity.  In the setting of remote breast cancer diagnosis, she had a MUGA scan in 2001 which showed normal LV function.  Subsequent echo in July 2006 showed an EF of 55 to 65%.  She was diagnosed with paroxysmal atrial fibrillation in July 2015 at which time she experienced palpitations and was admitted to a hospital in [place], where she lived at the time.  She spontaneously converted to sinus rhythm and has since been on Eliquis (CHA2DS2-VASc equals 5) and flecainide.  In 2019, she underwent stress testing, which showed mid anterior, apical lateral, and apical ischemia.  This was followed by diagnostic catheterization February 2019 which showed mild diffuse disease throughout, and she was medically managed. ? ?[name] established care with [name] in May 2020, after moving to [facility].  Her most recent echocardiogram was performed in September 2021, showing an EF  of 60 to 65% with mild mitral regurgitation.  She was last seen in cardiology clinic in June 2022, at which time she was felt to be stable from a heart failure standpoint, though her blood pressure was elevated and verapamil was increased to 360 mg daily.  Since her last visit, she  has been exercising 3 times a week at the [facility] and has tolerated this well.  She typically rides a recumbent bike, which she says is boring but is happy for the activity.  She tolerates this well without chest pain or dyspnea.  Her weight is down 4 pounds since her last visit and she hopes to continue to lose weight.  She periodically checks her blood pressure at home and notes that it typically in the 130's.  She continues to wear compression socks and has not been having any significant issues with lower extremity edema.  She denies palpitations, PND, orthopnea, dizziness, syncope, or early satiety. ? ?Home Medications  ?  ?Current Outpatient Medications  ?Medication Sig Dispense Refill  ? albuterol (PROVENTIL HFA;VENTOLIN HFA) 108 (90 Base) MCG/ACT inhaler Inhale 2 puffs into the lungs every 6 (six) hours as needed for wheezing or shortness of breath. 1 Inhaler 2  ? buPROPion (WELLBUTRIN XL) 300 MG 24 hr tablet Take 300 mg by mouth daily.    ? Cholecalciferol (VITAMIN D3) 75 MCG (3000 UT) TABS Take 3,000 mg by mouth daily.    ? DULoxetine (CYMBALTA) 20 MG capsule Take 40 mg by mouth daily.    ? fexofenadine (ALLEGRA) 180 MG tablet Take 180 mg by mouth daily.    ? fluticasone (FLONASE) 50 MCG/ACT nasal spray Place 1 spray into both nostrils daily as needed for allergies.    ? furosemide (LASIX) 40 MG tablet Take 40 mg by mouth daily.    ? lamoTRIgine (LAMICTAL) 25 MG tablet Take 25 mg by mouth every 12 (twelve) hours.    ? levothyroxine (SYNTHROID) 50 MCG tablet Take 50 mcg by mouth daily before breakfast.    ? lisinopril (PRINIVIL,ZESTRIL) 40 MG tablet Take 40 mg by mouth daily.    ? metFORMIN (GLUCOPHAGE) 1000 MG tablet Take 1,000 mg by mouth every 12 (twelve) hours.    ? montelukast (SINGULAIR) 10 MG tablet Take 10 mg by mouth at bedtime.    ? omeprazole (PRILOSEC) 20 MG capsule Take 20 mg by mouth every 12 (twelve) hours.    ? Prenatal Vit-Fe Fumarate-FA (PRENATAL MULTIVITAMIN) TABS tablet Take 1 tablet by  mouth daily. With iron    ? verapamil (CALAN-SR) 180 MG CR tablet Take 2 tablets (360 mg total) by mouth daily. 180 tablet 3  ? apixaban (ELIQUIS) 5 MG TABS tablet Take 1 tablet (5 mg total) by mouth every 12 (twelve) hours. 60 tablet 11  ? flecainide (TAMBOCOR) 50 MG tablet Take 1 tablet (50 mg total) by mouth 2 (two) times daily. 180 tablet 3  ? ?No current facility-administered medications for this visit.  ?  ? ?Review of Systems  ?  ?Chronic, mild lower extremity swelling, which is well managed with compression socks and p.o. Lasix.  She denies chest pain, dyspnea, palpitations, PND, orthopnea, dizziness, syncope, or early satiety.  All other systems reviewed and are otherwise negative except as noted above. ?  ? ?Physical Exam  ?  ?VS:  BP 140/80 (BP Location: Left Arm, Patient Position: Sitting, Cuff Size: Large)   Pulse 87   Ht '5\' 5"'$  (1.651 m)   Wt (!) 358 lb (162.4 kg)  SpO2 96%   BMI 59.57 kg/m?  , BMI Body mass index is 59.57 kg/m?. ?    ?Vitals:  ? 03/17/22 1137 03/17/22 1218  ?BP: 140/80 136/72  ?Pulse: 87   ?SpO2: 96%   ?  ?GEN: Well nourished, well developed, in no acute distress. ?HEENT: normal. ?Neck: Supple, obese, difficult to gauge JVP.  No bruits or masses.   ?Cardiac: RRR, 1/6 systolic murmur at the left lower sternal border.  No rubs or gallops. No clubbing, cyanosis, trace bilateral ankle edema.  Radials/PT 2+ and equal bilaterally.  ?Respiratory:  Respirations regular and unlabored, clear to auscultation bilaterally. ?GI: Obese, soft, nontender, nondistended, BS + x 4. ?MS: no deformity or atrophy. ?Skin: warm and dry, no rash. ?Neuro:  Strength and sensation are intact. ?Psych: Normal affect. ? ?Accessory Clinical Findings  ?  ?ECG personally reviewed by me today -regular sinus rhythm, 87, baseline artifact- no acute changes. ? ?Lab Results  ?Component Value Date  ? WBC 10.9 (H) 03/26/2020  ? HGB 11.9 (L) 03/26/2020  ? HCT 38.7 03/26/2020  ? MCV 90.2 03/26/2020  ? PLT 330 03/26/2020   ? ?Lab Results  ?Component Value Date  ? CREATININE 0.70 07/31/2020  ? BUN 15 03/26/2020  ? NA 139 03/26/2020  ? K 4.1 03/26/2020  ? CL 97 (L) 03/26/2020  ? CO2 28 03/26/2020  ? ?Lab Results  ?Component Value Date  ?

## 2022-04-17 NOTE — Telephone Encounter (Signed)
Appt complete closing encounter  ?

## 2022-05-01 ENCOUNTER — Ambulatory Visit: Payer: 59 | Admitting: Pulmonary Disease

## 2022-05-01 ENCOUNTER — Encounter: Payer: Self-pay | Admitting: Pulmonary Disease

## 2022-05-01 VITALS — BP 134/76 | HR 85 | Temp 98.3°F | Ht 65.0 in | Wt 377.8 lb

## 2022-05-01 DIAGNOSIS — E669 Obesity, unspecified: Secondary | ICD-10-CM | POA: Diagnosis not present

## 2022-05-01 DIAGNOSIS — F5105 Insomnia due to other mental disorder: Secondary | ICD-10-CM

## 2022-05-01 DIAGNOSIS — G4733 Obstructive sleep apnea (adult) (pediatric): Secondary | ICD-10-CM

## 2022-05-01 DIAGNOSIS — F418 Other specified anxiety disorders: Secondary | ICD-10-CM

## 2022-05-01 DIAGNOSIS — G4730 Sleep apnea, unspecified: Secondary | ICD-10-CM | POA: Diagnosis not present

## 2022-05-01 NOTE — Progress Notes (Signed)
? ?[name] ? ?Chief Complaint  ?Patient presents with  ? Follow-up  ?  Needing supplies   ? ? ?Past Surgical History:  ?She  has a past surgical history that includes Mastectomy (Right); Finger surgery (Right); Cardiac catheterization (02/16/2018); and Colonoscopy with propofol (N/A, 11/28/2021). ? ?Past Medical History:  ?Asthma, A fib, Breast Cancer, GERD, HTN, DM, Diastolic CHF, PAF ? ?Constitutional:  ?BP 134/76 (BP Location: Left Arm, Patient Position: Sitting, Cuff Size: Large)   Pulse 85   Temp 98.3 ?F (36.8 ?C) (Oral)   Ht '5\' 5"'$  (1.651 m)   Wt (!) 377 lb 12.8 oz (171.4 kg)   SpO2 96%   BMI 62.87 kg/m?  ? ?Brief Summary:  ?[name] is a 61 y.o. female former smoker with obstructive sleep apnea. ?  ? ? ? ?Subjective:  ? ?She uses CPAP nightly and during naps.  Recently changed to memory foam mask, but this isn't comfortable.  She will contact DME.  No issue with pressure setting. ? ?She has been feeling depressed.  This has impacted her sleep.  She doesn't go to bed or wake up at consistent times.  She naps for 2 hours in the afternoon.  She is getting about 7 to 9 hours sleep per day. ? ?Physical Exam:  ? ?Appearance - well kempt  ? ?ENMT - no sinus tenderness, no oral exudate, no LAN, Mallampati 3 airway, no stridor, triangular uvula ? ?Respiratory - equal breath sounds bilaterally, no wheezing or rales ? ?CV - s1s2 regular rate and rhythm, no murmurs ? ?Ext - no clubbing, no edema ? ?Skin - no rashes ? ?Psych - normal mood and affect ?  ?Pulmonary testing:  ?Spirometry 09/06/18 >> FEV1 2.20 (82%), FEV1% 79 ? ?Sleep Tests:  ?PSG 09/30/18 >> AHI 76, SpO2 low 72% ?Auto CPAP 04/29/20 to 05/28/20 >> used on 30 of 30 nights with average 8 hrs 34 min.  Average AHI 0.5 with 95 th percentile CPAP 13 cm H2O ? ?Cardiac Tests:  ?Echo 08/30/20 >> EF 60 to 65%, mild LA/RA dilation, mild MR ? ?Social History:  ?She  reports that she quit smoking about 38 years ago. Her  smoking use included cigarettes. She has a 1.20 pack-year smoking history. She has never used smokeless tobacco. She reports that she does not currently use alcohol. She reports that she does not use drugs. ? ?Family History:  ?Her family history includes Angina in her mother; Atrial fibrillation in her mother; Heart Problems in her father and mother; Hypertension in her father and mother; Kidney Stones in her sister. ?  ? ? ?Assessment/Plan:  ? ?Obstructive sleep apnea. ?- she is compliant with therapy and reports benefit from CPAP ?- she uses [employer] for her DME ?- continue auto CPAP 8 to 14 cm H2O ?- will arrange for replacement supplies ?- will call her with result of CPAP download ? ?Insomnia with depression. ?- she will be seeing behavioral health ?- discussed proper sleep hygiene and benefits for consolidating sleep time with consistent sleep-wake schedule ? ?Obesity. ?- discussed importance of weight loss ? ?Chronic diastolic CHF, Paroxysmal atrial fibrillation. ?- followed by Dr. Harrell Gave End with cardiology ? ?Time Spent Involved in Patient Care on Day of Examination:  ?37 minutes ? ?Follow up:  ? ?Patient Instructions  ?Will arrange for replacement CPAP supplies ? ?Follow up in 1 year ? ?Medication List:  ? ?Allergies as of 05/01/2022   ? ?   Reactions  ?  Erythromycin Diarrhea  ? ?  ? ?  ?Medication List  ?  ? ?  ? Accurate as of May 01, 2022  2:11 PM. If you have any questions, ask your nurse or doctor.  ?  ?  ? ?  ? ?albuterol 108 (90 Base) MCG/ACT inhaler ?Commonly known as: VENTOLIN HFA ?Inhale 2 puffs into the lungs every 6 (six) hours as needed for wheezing or shortness of breath. ?  ?apixaban 5 MG Tabs tablet ?Commonly known as: ELIQUIS ?Take 1 tablet (5 mg total) by mouth every 12 (twelve) hours. ?  ?buPROPion 300 MG 24 hr tablet ?Commonly known as: WELLBUTRIN XL ?Take 300 mg by mouth daily. ?  ?DULoxetine 20 MG capsule ?Commonly known as: CYMBALTA ?Take 40 mg by mouth daily. ?  ?fexofenadine 180  MG tablet ?Commonly known as: ALLEGRA ?Take 180 mg by mouth daily. ?  ?flecainide 50 MG tablet ?Commonly known as: TAMBOCOR ?Take 1 tablet (50 mg total) by mouth 2 (two) times daily. ?  ?fluticasone 50 MCG/ACT nasal spray ?Commonly known as: FLONASE ?Place 1 spray into both nostrils daily as needed for allergies. ?  ?furosemide 40 MG tablet ?Commonly known as: LASIX ?Take 40 mg by mouth daily. ?  ?lamoTRIgine 25 MG tablet ?Commonly known as: LAMICTAL ?Take 25 mg by mouth every 12 (twelve) hours. ?  ?levothyroxine 50 MCG tablet ?Commonly known as: SYNTHROID ?Take 50 mcg by mouth daily before breakfast. ?  ?lisinopril 40 MG tablet ?Commonly known as: ZESTRIL ?Take 40 mg by mouth daily. ?  ?metFORMIN 1000 MG tablet ?Commonly known as: GLUCOPHAGE ?Take 1,000 mg by mouth every 12 (twelve) hours. ?  ?montelukast 10 MG tablet ?Commonly known as: SINGULAIR ?Take 10 mg by mouth at bedtime. ?  ?omeprazole 20 MG capsule ?Commonly known as: PRILOSEC ?Take 20 mg by mouth every 12 (twelve) hours. ?  ?prenatal multivitamin Tabs tablet ?Take 1 tablet by mouth daily. With iron ?  ?verapamil 180 MG CR tablet ?Commonly known as: CALAN-SR ?Take 2 tablets (360 mg total) by mouth daily. ?  ?Vitamin D3 75 MCG (3000 UT) Tabs ?Take 3,000 mg by mouth daily. ?  ? ?  ? ? ?Signature:  ?Chesley Mires, MD ?[place] ?Pager - [phone] ?05/01/2022, 2:11 PM ?  ? ? ? ? ? ? ? ? ?

## 2022-05-01 NOTE — Patient Instructions (Signed)
Will arrange for replacement CPAP supplies ? ?Follow up in 1 year ?

## 2022-05-04 ENCOUNTER — Encounter: Payer: Self-pay | Admitting: Pulmonary Disease

## 2022-05-04 DIAGNOSIS — G4733 Obstructive sleep apnea (adult) (pediatric): Secondary | ICD-10-CM

## 2022-05-04 NOTE — Telephone Encounter (Signed)
Please let her know I have placed order through [facility]. ?

## 2022-05-04 NOTE — Telephone Encounter (Signed)
Dr. Halford Chessman please advise on the following My Chart message:  ? ?[name] Pool (supporting Chesley Mires, MD) 2 hours ago (1:44 PM)  ? ?Dr. Halford Chessman,  Following my appt. with you last Friday, I asked [facility] to release my supply order.  Today, I am informed by my insurance plan they are no longer in-network with [facility].  I have contacted [facility]. here in [place] and they [place] in network with [facility] [place].  I spoke with Corey Skains, explaining I am out of supplies, and she asks that you fax my prescription along with notes, sleep study (which I haven't had one here in [place]) and type of machine and supplies needed.  They can fill right away, once this information is faxed.    Please fax to:  [phone] ?I own my CPAP machine: ResMed AirSense 10, my cushions are F20 Medium, with memory foam, heated tubing, head gear but no filters (I have plenty).  Also, Dr. Halford Chessman,  we discussed my going back to silicone but I now remember they caused a rash on my face.  I will need memory foam cushions.  If there is any other information I can provide, please reach out to me.  Thank you so much, [name] ?  ?Thank you  ?

## 2022-06-19 ENCOUNTER — Other Ambulatory Visit: Payer: Self-pay | Admitting: Internal Medicine

## 2022-06-19 NOTE — Telephone Encounter (Signed)
Hi, Could you schedule this patient a 6 month follow up? Thank you so much.

## 2022-06-19 NOTE — Telephone Encounter (Signed)
Attempted to schedule.  

## 2022-07-18 ENCOUNTER — Encounter: Payer: Self-pay | Admitting: Emergency Medicine

## 2022-07-18 ENCOUNTER — Ambulatory Visit
Admission: EM | Admit: 2022-07-18 | Discharge: 2022-07-18 | Disposition: A | Discharge status: Home / Self Care | Payer: 59 | Attending: Family Medicine | Admitting: Family Medicine

## 2022-07-18 DIAGNOSIS — R051 Acute cough: Secondary | ICD-10-CM

## 2022-07-18 DIAGNOSIS — R062 Wheezing: Secondary | ICD-10-CM | POA: Diagnosis not present

## 2022-07-18 DIAGNOSIS — J4521 Mild intermittent asthma with (acute) exacerbation: Secondary | ICD-10-CM | POA: Diagnosis not present

## 2022-07-18 MED ORDER — AZITHROMYCIN 250 MG PO TABS: 250.0000 mg | ORAL_TABLET | Freq: Every day | ORAL | 0 refills | Status: DC

## 2022-07-18 MED ORDER — DEXAMETHASONE SODIUM PHOSPHATE 10 MG/ML IJ SOLN
10.0000 mg | Freq: Once | INTRAMUSCULAR | Status: AC
  Administered 2022-07-18: 10 mg via INTRAMUSCULAR

## 2022-07-18 NOTE — Discharge Instructions (Signed)
Meds ordered this encounter  Medications   dexamethasone (DECADRON) injection 10 mg   azithromycin (ZITHROMAX) 250 MG tablet    Sig: Take 1 tablet (250 mg total) by mouth daily. Take first 2 tablets together, then 1 every day until finished.    Dispense:  6 tablet    Refill:  0

## 2022-07-18 NOTE — ED Triage Notes (Signed)
Pt is present today with concerns for a productive cough and chest congestion. Pt sx started this morning.

## 2022-07-20 NOTE — ED Provider Notes (Signed)
[place]   [id number] 07/18/22 Arrival Time: 4163  ASSESSMENT & PLAN:  1. Mild intermittent asthma with acute exacerbation   2. Acute cough   3. Wheezing    Discussed typical duration of viral illnesses. No resp distress. OTC symptom care as needed.  Meds ordered this encounter  Medications   dexamethasone (DECADRON) injection 10 mg   azithromycin (ZITHROMAX) 250 MG tablet    Sig: Take 1 tablet (250 mg total) by mouth daily. Take first 2 tablets together, then 1 every day until finished.    Dispense:  6 tablet    Refill:  0     Follow-up Information     Hayden Rasmussen, MD.   Specialty: Family Medicine Why: As needed. Contact information: [address] [place] [address] [phone]                 Reviewed expectations re: course of current medical issues. Questions answered. Outlined signs and symptoms indicating need for more acute intervention. Understanding verbalized. After Visit Summary given.   SUBJECTIVE: History from: Patient. [name] is a 61 y.o. female. Reports: cough and wheezing; chest congestion; sev days; worse today. No fever reproted. Denies: CP. Normal PO intake without n/v/d. "Usu get a Zpak and a steroid shot with this."  Social History   Tobacco Use  Smoking Status Former   Packs/day: 0.30   Years: 4.00   Total pack years: 1.20   Types: Cigarettes   Quit date: 12/22/1983   Years since quitting: 38.6  Smokeless Tobacco Never    OBJECTIVE:  Vitals:   07/18/22 1435  BP: 129/66  Pulse: 92  Resp: 19  Temp: 98.2 F (36.8 C)  SpO2: 97%    General appearance: alert; no distress Eyes: PERRLA; EOMI; conjunctiva normal HENT: [place]; AT; with nasal congestion Neck: supple  Lungs: speaks full sentences without difficulty; unlabored; bilateral exp wheezing Extremities: no edema Skin: warm and dry Neurologic: normal gait Psychological: alert and cooperative; normal mood and affect  Allergies   Allergen Reactions   Erythromycin Diarrhea    Past Medical History:  Diagnosis Date   Anxiety 08/23/2014   Asthma    Atrial fibrillation ([place]) 06/2014   Breast cancer ([place])    Chronic heart failure with preserved ejection fraction (HFpEF) ([place])    a. 02/2000 MUGA: EF 68%; b. 06/2005 Echo: EF 55-65%; c. 06/2017 Echo: EF 60-65%; d. 08/2020 Echo: EF 60-65%, no rwma, nl RV fxn. Mild BAE. Mild MR.   Cough 09/06/2018   Current moderate episode of major depressive disorder without prior episode ([place]) 12/31/2017   Dependent edema 10/05/2011   Dyspnea and respiratory abnormality 09/06/2018   GE reflux    GERD (gastroesophageal reflux disease) 12/22/1999   Formatting of this note might be different from the original. Controlled by OTC meds   History of breast cancer 10/05/2011   Hypertension 10/05/2011   Long term current use of anticoagulant therapy 08/29/2014   Mild persistent asthma without complication 82/50/0370   Morbid obesity ([place]) 10/05/2011   Morbid obesity with BMI of 60.0-69.9, adult ([place]) 09/06/2018   Non-obstructive CAD (coronary artery disease)    a. 12/2017 Lexiscan PET/CT: mid anterior, apical lateral, and apical ischemia; b. 01/2018 Cath: LM nl, LAD & LCX 10-30% diff dzs throughout, RCA min irregs (<10%)-->Med rx.   Obstructive sleep apnea, adult 09/06/2018   Paroxysmal atrial fibrillation ([place]) 04/21/2019   Sleep apnea 06/03/2020   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/2014  Type 2 diabetes mellitus without complication, without long-term current use of insulin ([place]) 04/21/2019   Urinary bladder incontinence 06/13/2013   Social History   Socioeconomic History   Marital status: Married    Spouse name: Not on file   Number of children: Not on file   Years of education: Not on file   Highest education level: Not on file  Occupational History   Not on file  Tobacco Use   Smoking status: Former    Packs/day: 0.30    Years: 4.00    Total pack years: 1.20    Types:  Cigarettes    Quit date: 12/22/1983    Years since quitting: 38.6   Smokeless tobacco: Never  Vaping Use   Vaping Use: Never used  Substance and Sexual Activity   Alcohol use: Not Currently   Drug use: No   Sexual activity: Not on file  Other Topics Concern   Not on file  Social History Narrative   Not on file   Social Determinants of Health   Financial Resource Strain: Not on file  Food Insecurity: Not on file  Transportation Needs: Not on file  Physical Activity: Not on file  Stress: Not on file  Social Connections: Not on file  Intimate Partner Violence: Not on file   Family History  Problem Relation Age of Onset   Heart Problems Mother    Hypertension Mother    Angina Mother    Atrial fibrillation Mother    Heart Problems Father    Hypertension Father    Kidney Stones Sister    Kidney cancer Neg Hx    Prostate cancer Neg Hx    Bladder Cancer Neg Hx    Past Surgical History:  Procedure Laterality Date   CARDIAC CATHETERIZATION  02/16/2018   No stents;[place]. Collene [place] [facility]   COLONOSCOPY WITH PROPOFOL N/A 11/28/2021   Procedure: COLONOSCOPY WITH PROPOFOL;  Surgeon: Carol Ada, MD;  Location: [facility];  Service: Endoscopy;  Laterality: N/A;   FINGER SURGERY Right    MASTECTOMY Right      Vanessa Kick, MD 07/20/22 1039

## 2022-07-24 DIAGNOSIS — Z20822 Contact with and (suspected) exposure to covid-19: Secondary | ICD-10-CM | POA: Diagnosis not present

## 2022-08-14 DIAGNOSIS — G4733 Obstructive sleep apnea (adult) (pediatric): Secondary | ICD-10-CM | POA: Diagnosis not present

## 2022-09-08 NOTE — Progress Notes (Signed)
Requested PCP notes.  

## 2022-09-14 ENCOUNTER — Ambulatory Visit: Payer: 59 | Attending: Nurse Practitioner | Admitting: Nurse Practitioner

## 2022-09-14 ENCOUNTER — Encounter: Payer: Self-pay | Admitting: Nurse Practitioner

## 2022-09-14 VITALS — BP 150/78 | HR 91 | Ht 65.0 in | Wt 390.0 lb

## 2022-09-14 DIAGNOSIS — Z6841 Body Mass Index (BMI) 40.0 and over, adult: Secondary | ICD-10-CM | POA: Diagnosis not present

## 2022-09-14 DIAGNOSIS — E119 Type 2 diabetes mellitus without complications: Secondary | ICD-10-CM

## 2022-09-14 DIAGNOSIS — I480 Paroxysmal atrial fibrillation: Secondary | ICD-10-CM

## 2022-09-14 DIAGNOSIS — I5032 Chronic diastolic (congestive) heart failure: Secondary | ICD-10-CM | POA: Diagnosis not present

## 2022-09-14 DIAGNOSIS — I10 Essential (primary) hypertension: Secondary | ICD-10-CM | POA: Diagnosis not present

## 2022-09-14 MED ORDER — FLECAINIDE ACETATE 50 MG PO TABS: 50.0000 mg | ORAL_TABLET | Freq: Two times a day (BID) | ORAL | 3 refills | Status: DC

## 2022-09-14 MED ORDER — VERAPAMIL HCL ER 180 MG PO TBCR: 360.0000 mg | EXTENDED_RELEASE_TABLET | Freq: Every day | ORAL | 3 refills | Status: DC

## 2022-09-14 NOTE — Progress Notes (Signed)
Office Visit    Patient Name: [name] Date of Encounter: 09/14/2022  Primary Care Provider:  Hayden Rasmussen, MD Primary Cardiologist:  Nelva Bush, MD  Chief Complaint    61 year old female with a history of chronic HFpEF, nonobstructive CAD, chronic lower extremity edema, paroxysmal atrial fibrillation, hypertension, obstructive sleep apnea, diabetes, breast cancer, and obesity, who presents for follow-up related to HFpEF.  Past Medical History    Past Medical History:  Diagnosis Date   Anxiety 08/23/2014   Asthma    Atrial fibrillation ([place]) 06/2014   Breast cancer ([place])    Chronic heart failure with preserved ejection fraction (HFpEF) ([place])    a. 02/2000 MUGA: EF 68%; b. 06/2005 Echo: EF 55-65%; c. 06/2017 Echo: EF 60-65%; d. 08/2020 Echo: EF 60-65%, no rwma, nl RV fxn. Mild BAE. Mild MR.   Cough 09/06/2018   Current moderate episode of major depressive disorder without prior episode ([place]) 12/31/2017   Dependent edema 10/05/2011   Dyspnea and respiratory abnormality 09/06/2018   GE reflux    GERD (gastroesophageal reflux disease) 12/22/1999   Formatting of this note might be different from the original. Controlled by OTC meds   History of breast cancer 10/05/2011   Hypertension 10/05/2011   Long term current use of anticoagulant therapy 08/29/2014   Mild persistent asthma without complication 93/73/4287   Morbid obesity ([place]) 10/05/2011   Morbid obesity with BMI of 60.0-69.9, adult ([place]) 09/06/2018   Non-obstructive CAD (coronary artery disease)    a. 12/2017 Lexiscan PET/CT: mid anterior, apical lateral, and apical ischemia; b. 01/2018 Cath: LM nl, LAD & LCX 10-30% diff dzs throughout, RCA min irregs (<10%)-->Med rx.   Obstructive sleep apnea, adult 09/06/2018   Paroxysmal atrial fibrillation ([place]) 04/21/2019   Sleep apnea 06/03/2020   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/2014   Type 2 diabetes mellitus without complication, without long-term current use of  insulin ([place]) 04/21/2019   Urinary bladder incontinence 06/13/2013   Past Surgical History:  Procedure Laterality Date   CARDIAC CATHETERIZATION  02/16/2018   No stents;[place] Collene [place] [facility]   COLONOSCOPY WITH PROPOFOL N/A 11/28/2021   Procedure: COLONOSCOPY WITH PROPOFOL;  Surgeon: Carol Ada, MD;  Location: [facility];  Service: Endoscopy;  Laterality: N/A;   FINGER SURGERY Right    MASTECTOMY Right     Allergies  Allergies  Allergen Reactions   Erythromycin Diarrhea    History of Present Illness    61 year old female with the above past medical history including chronic HFpEF, chronic lower extremity edema, nonobstructive CAD, paroxysmal atrial fibrillation, hypertension, sleep apnea, diabetes, breast cancer, and obesity.  In the setting of remote breast cancer diagnosis, she had a MUGA scan in 2001, which showed normal LV function.  Subsequent echo in July 2006, showed an EF of 55 to 65%.  She was diagnosed with paroxysmal atrial fibrillation July 2015, at which time she experienced palpitations and admitted to hospital in [place], where she lived at this time.  She spontaneous converted to sinus rhythm and has since been on Eliquis (CHA2DS2-VASc equals 5) and flecainide.  In 2019, she underwent stress testing, which showed mid anterior, apical lateral, and apical ischemia.  This was followed by diagnostic catheterization in February 2019, which showed mild diffuse disease throughout.  She has been medically managed.  [name] established care with Dr. Saunders Revel in May 2020, after moving to [facility].  Most recent echo in September 2021, showed an EF of 60 to 65%  with mild mitral regurgitation.  She was last seen in cardiology clinic in March 2023, at which time she was doing well and exercising 3 times per week.  Unfortunately, since then, she has not been exercising.  She lost her mother and has had significant depression with this.  In that setting, she has  had decreased motivation and energy and her weight is up 32 pounds since her last visit.  With weight gain, her knees have been hurting more and thus her activity has been less.  She has also been renovating her her home and has been living in and out of an extended stay hotel which has only increased her stressed and changed her dietary habits.  She denies dyspnea on exertion, as long as she does not walk too fast or have to talk to somebody.  She does not experience chest pain.  She denies palpitations, PND, orthopnea, dizziness, syncope, or early satiety.  She has mild, chronic lower extremity swelling which remains well managed with compression socks and oral Lasix.  Home Medications    Current Outpatient Medications  Medication Sig Dispense Refill   albuterol (PROVENTIL HFA;VENTOLIN HFA) 108 (90 Base) MCG/ACT inhaler Inhale 2 puffs into the lungs every 6 (six) hours as needed for wheezing or shortness of breath. 1 Inhaler 2   ALPRAZolam (XANAX) 0.5 MG tablet Take 0.25-0.5 mg by mouth daily as needed.     apixaban (ELIQUIS) 5 MG TABS tablet Take 1 tablet (5 mg total) by mouth every 12 (twelve) hours. 60 tablet 11   Cholecalciferol (VITAMIN D3) 75 MCG (3000 UT) TABS Take 3,000 mg by mouth daily.     fexofenadine (ALLEGRA) 180 MG tablet Take 180 mg by mouth daily.     fluticasone (FLONASE) 50 MCG/ACT nasal spray Place 1 spray into both nostrils daily as needed for allergies.     furosemide (LASIX) 40 MG tablet Take 40 mg by mouth daily.     lamoTRIgine (LAMICTAL) 25 MG tablet Take 25 mg by mouth every 12 (twelve) hours.     levothyroxine (SYNTHROID) 50 MCG tablet Take 50 mcg by mouth daily before breakfast.     lisinopril (PRINIVIL,ZESTRIL) 40 MG tablet Take 40 mg by mouth daily.     metFORMIN (GLUCOPHAGE) 1000 MG tablet Take 1,000 mg by mouth every 12 (twelve) hours.     montelukast (SINGULAIR) 10 MG tablet Take 10 mg by mouth at bedtime.     omeprazole (PRILOSEC) 20 MG capsule Take 20 mg by  mouth every 12 (twelve) hours.     Prenatal Vit-Fe Fumarate-FA (PRENATAL MULTIVITAMIN) TABS tablet Take 1 tablet by mouth daily. With iron     flecainide (TAMBOCOR) 50 MG tablet Take 1 tablet (50 mg total) by mouth 2 (two) times daily. 180 tablet 3   verapamil (CALAN-SR) 180 MG CR tablet Take 2 tablets (360 mg total) by mouth daily. 180 tablet 3   No current facility-administered medications for this visit.     Review of Systems    Chronic, stable lower extremity edema and dyspnea on exertion.  She has been more depressed and anxious over the past several months related to her mother's death and renovating her home.  She denies chest pain, palpitations, PND, orthopnea dizziness, syncope, or early satiety.  All other systems reviewed and are otherwise negative except as noted above.    Physical Exam    VS:  BP (!) 150/80 (BP Location: Left Arm, Patient Position: Sitting, Cuff Size: Large)   Pulse 91  Ht '5\' 5"'$  (1.651 m)   Wt (!) 390 lb (176.9 kg)   SpO2 98%   BMI 64.90 kg/m  , BMI Body mass index is 64.9 kg/m.     Today's Vitals   09/14/22 0852 09/14/22 1020  BP: (!) 150/80 (!) 150/78  Pulse: 91   SpO2: 98%   Weight: (!) 390 lb (176.9 kg)   Height: '5\' 5"'$  (1.651 m)    Body mass index is 64.9 kg/m. s  GEN: Obese in no acute distress. HEENT: normal. Neck: Supple, obese, difficult to gauge JVP.  No bruits or masses. Cardiac: RRR, 2/6 systolic murmur throughout, no rubs or gallops. No clubbing, cyanosis, trace bilateral lower extremity edema-compression socks in place.  Radials 2+, PT 1+ and equal bilaterally.  Respiratory:  Respirations regular and unlabored, clear to auscultation bilaterally. GI: Obese, soft, nontender, nondistended, BS + x 4. MS: no deformity or atrophy. Skin: warm and dry, no rash. Neuro:  Strength and sensation are intact. Psych: Normal affect.  Accessory Clinical Findings    ECG personally reviewed by me today -regular sinus rhythm, 91- no acute  changes.  Lab Results  Component Value Date   WBC 10.9 (H) 03/26/2020   HGB 11.9 (L) 03/26/2020   HCT 38.7 03/26/2020   MCV 90.2 03/26/2020   PLT 330 03/26/2020   Lab Results  Component Value Date   CREATININE 0.70 07/31/2020   BUN 15 03/26/2020   NA 139 03/26/2020   K 4.1 03/26/2020   CL 97 (L) 03/26/2020   CO2 28 03/26/2020   Lab Results  Component Value Date   ALT 14 06/04/2009   AST 14 06/04/2009   ALKPHOS 57 06/04/2009   BILITOT 0.2 (L) 06/04/2009    Assessment & Plan    1.  Chronic heart failure with preserved ejection fraction: EF 60 to 65% by echo in September 2021.  Though she has not been having any significant symptoms or limitations in her activity, she has been more sedentary and her weight is up 32 pounds since her last visit.  She has chronic, mild lower extremity swelling for which she uses compression socks and also takes Lasix 40 mg once a day.  Heart rate is stable without any known recurrence of A-fib however, blood pressure is elevated today.  She has yet to take her morning medications and has not recently been following her blood pressures at home.  She agreed to go home and take her medications and begin following her blood pressure more closely.  She will contact us within the next week if she is consistently running greater than 865 systolic, at which time we will need to consider an additional agent.  She would likely benefit from spironolactone.  2.  Paroxysmal atrial fibrillation: No known recent paroxysms or palpitations.  She remains in sinus rhythm on flecainide, verapamil, and Eliquis.  Its been about 6 months since her last set of labs and we will follow-up a CBC and basic metabolic panel today as she is due for refills on flecainide and Eliquis.  3.  Essential hypertension: Blood pressure elevated today at 150/80 and 150/78 on repeat.  She has yet to take her morning medications.  Goal systolic of less than 784.  I encouraged her to go home and take  her medicines and start following her blood pressures daily.  She is to contact us if pressures remain elevated, in which case I would have spironolactone 25 mg daily.  4.  Morbid obesity: Patient's weight  is up 32 pounds since her last visit in the setting of inactivity.  She has been less able to prepare her own meals recently as she is having renovations done to her home, causing her to have to stay at a hotel.  We had a long discussion today about weight loss, nutrition, physical activity, and stress management.  I offered to refer her to a dietitian, though she prefers to speak to her primary care provider first.  Further, she will discuss potential initiation of a GLP-1 agonist with her primary care provider.  Finally, I provided her with information regarding a whole food plant-based diet.  5.  Type 2 diabetes mellitus: Followed by primary care and on metformin.  Consider GLP-1 agonist in the setting of morbid obesity.  6.  Disposition: Follow-up CBC and basic metabolic panel today.  Follow-up in clinic in 3 months or sooner if necessary.   Murray Hodgkins, NP 09/14/2022, 10:17 AM

## 2022-09-14 NOTE — Patient Instructions (Addendum)
Medication Instructions:  - Your physician recommends that you continue on your current medications as directed. Please refer to the Current Medication list given to you today.  *If you need a refill on your cardiac medications before your next appointment, please call your pharmacy*   Lab Work: - Your physician recommends that you have lab work today:  BMP/ [place] Entrance at [facility] 1st desk on the right to check in (REGISTRATION)  Lab hours: Monday- Friday (7:30 am- 5:30 pm)   If you have labs (blood work) drawn today and your tests are completely normal, you will receive your results only by: MyChart Message (if you have MyChart) OR A paper copy in the mail If you have any lab test that is abnormal or we need to change your treatment, we will call you to review the results.   Testing/Procedures: - none ordered   Follow-Up: At [facility], you and your health needs are our priority.  As part of our continuing mission to provide you with exceptional heart care, we have created designated Provider Care Teams.  These Care Teams include your primary Cardiologist (physician) and Advanced Practice Providers (APPs -  Physician Assistants and Nurse Practitioners) who all work together to provide you with the care you need, when you need it.  We recommend signing up for the patient portal called "MyChart".  Sign up information is provided on this After Visit Summary.  MyChart is used to connect with patients for Virtual Visits (Telemedicine).  Patients are able to view lab/test results, encounter notes, upcoming appointments, etc.  Non-urgent messages can be sent to your provider as well.   To learn more about what you can do with MyChart, go to [email or web address].    Your next appointment:   3 month(s)  The format for your next appointment:   In Person  Provider:   You may see Nelva Bush, MD or one of the following Advanced Practice Providers on your  designated Care Team:   Murray Hodgkins, NP     Other Instructions  Please check your blood pressure at home  How to Take Your Blood Pressure Blood pressure measures how strongly your blood is pressing against the walls of your arteries. Arteries are blood vessels that carry blood from your heart throughout your body. You can take your blood pressure at home with a machine. You may need to check your blood pressure at home: To check if you have high blood pressure (hypertension). To check your blood pressure over time. To make sure your blood pressure medicine is working. Supplies needed: Blood pressure machine, or monitor. A chair to sit in. This should be a chair where you can sit upright with your back supported. Do not sit on a soft couch or an armchair. Table or desk. Small notebook. Pencil or pen. How to prepare Avoid these things for 30 minutes before checking your blood pressure: Having drinks with caffeine in them, such as coffee or tea. Drinking alcohol. Eating. Smoking. Exercising. Do these things five minutes before checking your blood pressure: Go to the bathroom and pee (urinate). Sit in a chair. Be quiet. Do not talk. How to take your blood pressure Follow the instructions that came with your machine. If you have a digital blood pressure monitor, these may be the instructions: Sit up straight. Place your feet on the floor. Do not cross your ankles or legs. Rest your left arm at the level of your heart. You may rest it  on a table, desk, or chair. Pull up your shirt sleeve. Wrap the blood pressure cuff around the upper part of your left arm. The cuff should be 1 inch (2.5 cm) above your elbow. It is best to wrap the cuff around bare skin. Fit the cuff snugly around your arm, but not too tightly. You should be able to place only one finger between the cuff and your arm. Place the cord so that it rests in the bend of your elbow. Press the power button. Sit  quietly while the cuff fills with air and loses air. Write down the numbers on the screen. Wait 2-3 minutes and then repeat steps 1-10. What do the numbers mean? Two numbers make up your blood pressure. The first number is called systolic pressure. The second is called diastolic pressure. An example of a blood pressure reading is "120 over 80" (or 120/80). If you are an adult and do not have a medical condition, use this guide to find out if your blood pressure is normal: Normal First number: below 120. Second number: below 80. Elevated First number: 120-129. Second number: below 80. Hypertension stage 1 First number: 130-139. Second number: 80-89. Hypertension stage 2 First number: 140 or above. Second number: 63 or above. Your blood pressure is above normal even if only the first or only the second number is above normal. Follow these instructions at home: Medicines Take over-the-counter and prescription medicines only as told by your doctor. Tell your doctor if your medicine is causing side effects. General instructions Check your blood pressure as often as your doctor tells you to. Check your blood pressure at the same time every day. Take your monitor to your next doctor's appointment. Your doctor will: Make sure you are using it correctly. Make sure it is working right. Understand what your blood pressure numbers should be. Keep all follow-up visits. General tips You will need a blood pressure machine or monitor. Your doctor can suggest a monitor. You can buy one at a drugstore or online. When choosing one: Choose one with an arm cuff. Choose one that wraps around your upper arm. Only one finger should fit between your arm and the cuff. Do not choose one that measures your blood pressure from your wrist or finger. Where to find more information American Heart Association: www.heart.org Contact a doctor if: Your blood pressure keeps being high. Your blood pressure is  suddenly low. Get help right away if: Your first blood pressure number is higher than 180. Your second blood pressure number is higher than 120. These symptoms may be an emergency. Do not wait to see if the symptoms will go away. Get help right away. Call 911. Summary Check your blood pressure at the same time every day. Avoid caffeine, alcohol, smoking, and exercise for 30 minutes before checking your blood pressure. Make sure you understand what your blood pressure numbers should be. This information is not intended to replace advice given to you by your health care provider. Make sure you discuss any questions you have with your health care provider. Document Revised: 08/21/2021 Document Reviewed: 08/21/2021 Elsevier Patient Education  [place]

## 2022-09-21 ENCOUNTER — Telehealth: Payer: Self-pay | Admitting: *Deleted

## 2022-09-21 MED ORDER — APIXABAN 5 MG PO TABS: 5.0000 mg | ORAL_TABLET | Freq: Two times a day (BID) | ORAL | 0 refills | Status: DC

## 2022-09-21 NOTE — Telephone Encounter (Signed)
30 day Eliquis sent into pharmacy.

## 2022-09-21 NOTE — Telephone Encounter (Signed)
-----   Message from Theora Gianotti, NP sent at 09/18/2022  2:59 PM EDT ----- Providing 30 days sounds appropriate, until labs are checked.  Thanks. ----- Message ----- From: Valora Corporal, RN Sent: 09/18/2022  12:05 PM EDT To: Valora Corporal, RN; #  This patient was supposed to have labs before we refilled her Eliquis. She now needs refill and still no labs. Do you want me to give her 30 days worth and strongly advise her to have those done ASAP?  ----- Message ----- From: Emily Filbert, RN Sent: 09/15/2022   4:47 PM EDT To: Valora Corporal, RN  This patient saw Gerald Stabs yesterday. She was requesting a refill on her Eliquis, but she needed to have her BMP/ CBC repeated first.  She wasn't feeling well yesterday when she left (hadn't eaten or taken meds) so she was going to come back.  I just checked and she didn't come back today.   Just sending to you as an FYI that when she finally comes, she will need Eliquis refills, or a request sent to the anticoag team to refill.  Thanks!

## 2022-09-30 ENCOUNTER — Other Ambulatory Visit
Admission: RE | Admit: 2022-09-30 | Discharge: 2022-09-30 | Disposition: A | Discharge status: Home / Self Care | Payer: 59 | Attending: Nurse Practitioner | Admitting: Nurse Practitioner

## 2022-09-30 DIAGNOSIS — I480 Paroxysmal atrial fibrillation: Secondary | ICD-10-CM | POA: Diagnosis not present

## 2022-09-30 LAB — BASIC METABOLIC PANEL
Anion gap: 11 (ref 5–15)
BUN: 10 mg/dL (ref 8–23)
CO2: 28 mmol/L (ref 22–32)
Calcium: 8.9 mg/dL (ref 8.9–10.3)
Chloride: 101 mmol/L (ref 98–111)
Creatinine, Ser: 0.77 mg/dL (ref 0.44–1.00)
GFR, Estimated: >60 mL/min (ref >60)
Glucose, Bld: 127 mg/dL — ABNORMAL HIGH (ref 70–99)
Potassium: 3.7 mmol/L (ref 3.5–5.1)
Sodium: 140 mmol/L (ref 135–145)

## 2022-09-30 LAB — CBC
HCT: 37.2 % (ref 36.0–46.0)
Hemoglobin: 11.7 g/dL — ABNORMAL LOW (ref 12.0–15.0)
MCH: 27.4 pg (ref 26.0–34.0)
MCHC: 31.5 g/dL (ref 30.0–36.0)
MCV: 87.1 fL (ref 80.0–100.0)
Platelets: 368 10*3/uL (ref 150–400)
RBC: 4.27 MIL/uL (ref 3.87–5.11)
RDW: 15.5 % (ref 11.5–15.5)
WBC: 11.5 10*3/uL — ABNORMAL HIGH (ref 4.0–10.5)
nRBC: 0 % (ref 0.0–0.2)

## 2022-10-01 ENCOUNTER — Other Ambulatory Visit: Payer: Self-pay | Admitting: *Deleted

## 2022-10-01 MED ORDER — APIXABAN 5 MG PO TABS: 5.0000 mg | ORAL_TABLET | Freq: Two times a day (BID) | ORAL | 11 refills | Status: DC

## 2022-10-11 ENCOUNTER — Other Ambulatory Visit: Payer: Self-pay

## 2022-10-11 ENCOUNTER — Emergency Department: Payer: 59

## 2022-10-11 ENCOUNTER — Emergency Department
Admission: EM | Admit: 2022-10-11 | Discharge: 2022-10-12 | Disposition: A | Discharge status: Home / Self Care | Payer: 59 | Attending: Emergency Medicine | Admitting: Emergency Medicine

## 2022-10-11 DIAGNOSIS — I110 Hypertensive heart disease with heart failure: Secondary | ICD-10-CM | POA: Diagnosis not present

## 2022-10-11 DIAGNOSIS — E119 Type 2 diabetes mellitus without complications: Secondary | ICD-10-CM | POA: Diagnosis not present

## 2022-10-11 DIAGNOSIS — I10 Essential (primary) hypertension: Secondary | ICD-10-CM | POA: Diagnosis not present

## 2022-10-11 DIAGNOSIS — I509 Heart failure, unspecified: Secondary | ICD-10-CM | POA: Diagnosis not present

## 2022-10-11 DIAGNOSIS — R079 Chest pain, unspecified: Secondary | ICD-10-CM | POA: Diagnosis not present

## 2022-10-11 DIAGNOSIS — R202 Paresthesia of skin: Secondary | ICD-10-CM | POA: Diagnosis not present

## 2022-10-11 DIAGNOSIS — R0602 Shortness of breath: Secondary | ICD-10-CM | POA: Diagnosis not present

## 2022-10-11 DIAGNOSIS — J811 Chronic pulmonary edema: Secondary | ICD-10-CM | POA: Diagnosis not present

## 2022-10-11 DIAGNOSIS — R0789 Other chest pain: Secondary | ICD-10-CM | POA: Diagnosis not present

## 2022-10-11 LAB — CBC WITH DIFFERENTIAL/PLATELET
Abs Immature Granulocytes: 0.09 10*3/uL — ABNORMAL HIGH (ref 0.00–0.07)
Basophils Absolute: 0.1 10*3/uL (ref 0.0–0.1)
Basophils Relative: 1 %
Eosinophils Absolute: 0.1 10*3/uL (ref 0.0–0.5)
Eosinophils Relative: 1 %
HCT: 39 % (ref 36.0–46.0)
Hemoglobin: 12.1 g/dL (ref 12.0–15.0)
Immature Granulocytes: 1 %
Lymphocytes Relative: 20 %
Lymphs Abs: 2.2 10*3/uL (ref 0.7–4.0)
MCH: 27 pg (ref 26.0–34.0)
MCHC: 31 g/dL (ref 30.0–36.0)
MCV: 87.1 fL (ref 80.0–100.0)
Monocytes Absolute: 0.9 10*3/uL (ref 0.1–1.0)
Monocytes Relative: 8 %
Neutro Abs: 7.7 10*3/uL (ref 1.7–7.7)
Neutrophils Relative %: 69 %
Platelets: 342 10*3/uL (ref 150–400)
RBC: 4.48 MIL/uL (ref 3.87–5.11)
RDW: 15.1 % (ref 11.5–15.5)
WBC: 11.1 10*3/uL — ABNORMAL HIGH (ref 4.0–10.5)
nRBC: 0 % (ref 0.0–0.2)

## 2022-10-11 LAB — BASIC METABOLIC PANEL
Anion gap: 10 (ref 5–15)
BUN: 13 mg/dL (ref 8–23)
CO2: 30 mmol/L (ref 22–32)
Calcium: 9.3 mg/dL (ref 8.9–10.3)
Chloride: 100 mmol/L (ref 98–111)
Creatinine, Ser: 0.86 mg/dL (ref 0.44–1.00)
GFR, Estimated: >60 mL/min (ref >60)
Glucose, Bld: 110 mg/dL — ABNORMAL HIGH (ref 70–99)
Potassium: 3.9 mmol/L (ref 3.5–5.1)
Sodium: 140 mmol/L (ref 135–145)

## 2022-10-11 LAB — BRAIN NATRIURETIC PEPTIDE: B Natriuretic Peptide: 33.8 pg/mL (ref 0.0–100.0)

## 2022-10-11 LAB — TROPONIN I (HIGH SENSITIVITY)
Troponin I (High Sensitivity): 5 ng/L (ref <18)
Troponin I (High Sensitivity): 5 ng/L (ref <18)

## 2022-10-11 NOTE — ED Triage Notes (Signed)
Pt presents via EMS c/o chest pressure prior to arrival to the ED. Reports had chest pressure for 3 hrs prior to presenting. Reports SOB with ambulation. Denies chest pain currently.

## 2022-10-11 NOTE — ED Provider Triage Note (Signed)
Emergency Medicine Provider Triage Evaluation Note  [name] , a 61 y.o. female  was evaluated in triage.  Pt complains of CP that began just prior to arrival when she was sitting on the couch. Radiated to scapula. Lasted approximately 3 hours. Felt mildly SOB. No nausea or diaphoresis. Felt tingling in left arm, resolved. Called EMS, pain has improved. H/o HFpEF, CAD, pAF on eliquis. Stress test in 2019 with mid anterior, apical, lateral, ischemia.  Currently no chest pain. Feels back to baseline.   Review of Systems  Positive: CP/SOB, arm tingling Negative: Nausea/diaphoresis  Physical Exam  There were no vitals taken for this visit. Gen:   Awake, no distress   Resp:  Normal effort  MSK:   Moves extremities without difficulty  Other:    Medical Decision Making  Medically screening exam initiated at 4:08 PM.  Appropriate orders placed.  [name] was informed that the remainder of the evaluation will be completed by another provider, this initial triage assessment does not replace that evaluation, and the importance of remaining in the ED until their evaluation is complete.     Nichole Old, PA-C 10/11/22 1614

## 2022-10-12 NOTE — ED Notes (Signed)
Pt Dc to home. Dc instructions reviewed with all questions answered. Pt verbalizes understanding. Iv removed, cath intact, pressure dressing applied. No bleeding noted at site. Pt assisted out of dept via wheelchair with family member

## 2022-10-12 NOTE — ED Provider Notes (Signed)
[facility] Provider Note    Event Date/Time   First MD Initiated Contact with Patient 10/11/22 2341     (approximate)  History   Chief Complaint: Chest Pain  HPI  [name] is a 61 y.o. female the past medical history of anxiety, CHF on furosemide, hypertension, obesity, paroxysmal atrial fibrillation, diabetes, presents emergency department for chest pain.  Earlier today patient states she developed a pressure sensation in the center of her chest.  Patient denies any chest pain.  States she was having some mild shortness of breath at that time.  Patient states a history of CHF also states a history of anxiety and she was not sure what was causing her discomfort.  Patient states since arriving to the emergency department (now 8 hours ago) her symptoms resolved.  Patient denies any chest pain denies any shortness of breath denies any symptoms whatsoever currently.  Blood pressure initially elevated per patient currently 139/50 on recheck it is 145/72.  Physical Exam   Triage Vital Signs: ED Triage Vitals [10/11/22 1612]  Enc Vitals Group     BP (!) 171/82     Pulse Rate 77     Resp 16     Temp 98.5 F (36.9 C)     Temp Source Oral     SpO2 98 %     Weight (!) 384 lb (174.2 kg)     Height '5\' 5"'$  (1.651 m)     Head Circumference      Peak Flow      Pain Score 0     Pain Loc      Pain Edu?      Excl. in [place]?     Most recent vital signs: Vitals:   10/11/22 1612 10/11/22 2017  BP: (!) 171/82 (!) 139/50  Pulse: 77 77  Resp: 16 20  Temp: 98.5 F (36.9 C) 99.3 F (37.4 C)  SpO2: 98% 96%    General: Awake, no distress.  CV:  Good peripheral perfusion.  Regular rate and rhythm  Resp:  Normal effort.  Equal breath sounds bilaterally.  Abd:  No distention.  Soft, nontender.  No rebound or guarding. Other:  Very slight lower extremity edema compression stockings in place.   ED Results / Procedures / Treatments   EKG  EKG viewed and interpreted  by myself shows a normal sinus rhythm at 79 bpm with a narrow QRS, normal axis, normal intervals, no concerning ST changes.  RADIOLOGY  I have reviewed and interpreted the x-ray images I do not see any obvious consolidation on my evaluation. Radiology states CHF with mild edema.   MEDICATIONS ORDERED IN ED: Medications - No data to display   IMPRESSION / MDM / [place] / ED COURSE  I reviewed the triage vital signs and the nursing notes.  Patient's presentation is most consistent with acute presentation with potential threat to life or bodily function.  Patient presents emergency department for chest pressure earlier today.  Symptoms have since resolved.  Patient denies any chest pain denies any shortness of breath.  Patient's work-up is reassuring CBC is normal, chemistry reassuring and troponin is negative x2.  Chest x-ray shows signs of mild edema patient currently satting 99 to 100% on room air throughout my evaluation denies any shortness of breath.  Patient takes 40 mg of Lasix each morning I discussed with the patient for the next 3 days to increase that to 60 mg and follow-up with her  doctor.  Given the patient's reassuring work-up reassuring vitals and as she is symptom-free we will discharge the patient with outpatient follow-up 50% increase in Lasix for the next 3 days.  Patient agreeable to plan.  FINAL CLINICAL IMPRESSION(S) / ED DIAGNOSES   CHF Chest pain    Note:  This document was prepared using Dragon voice recognition software and may include unintentional dictation errors.   Harvest Dark, MD 10/12/22 [phone]

## 2022-10-12 NOTE — Discharge Instructions (Signed)
As we discussed for the next 3 days please take 60 mg (1.5 tablets) of your furosemide each morning, then resume your normal 40 mg dosing.  Please call your doctor tomorrow to arrange a follow-up appointment for recheck/reevaluation.  Return to the emergency department for any return of/worsening chest pain, shortness of breath or any other symptom personally concerning to yourself.

## 2022-10-13 DIAGNOSIS — Z7901 Long term (current) use of anticoagulants: Secondary | ICD-10-CM | POA: Diagnosis not present

## 2022-10-13 DIAGNOSIS — I5032 Chronic diastolic (congestive) heart failure: Secondary | ICD-10-CM | POA: Diagnosis not present

## 2022-10-13 DIAGNOSIS — I4891 Unspecified atrial fibrillation: Secondary | ICD-10-CM | POA: Diagnosis not present

## 2022-10-21 ENCOUNTER — Ambulatory Visit (INDEPENDENT_AMBULATORY_CARE_PROVIDER_SITE_OTHER): Payer: 59 | Admitting: Orthopaedic Surgery

## 2022-10-21 ENCOUNTER — Ambulatory Visit (INDEPENDENT_AMBULATORY_CARE_PROVIDER_SITE_OTHER): Payer: 59

## 2022-10-21 ENCOUNTER — Ambulatory Visit: Payer: Self-pay

## 2022-10-21 VITALS — Ht 65.0 in | Wt 384.0 lb

## 2022-10-21 DIAGNOSIS — G8929 Other chronic pain: Secondary | ICD-10-CM

## 2022-10-21 DIAGNOSIS — M1711 Unilateral primary osteoarthritis, right knee: Secondary | ICD-10-CM | POA: Diagnosis not present

## 2022-10-21 DIAGNOSIS — M25561 Pain in right knee: Secondary | ICD-10-CM | POA: Diagnosis not present

## 2022-10-21 DIAGNOSIS — Z6841 Body Mass Index (BMI) 40.0 and over, adult: Secondary | ICD-10-CM | POA: Diagnosis not present

## 2022-10-21 DIAGNOSIS — M25562 Pain in left knee: Secondary | ICD-10-CM

## 2022-10-21 DIAGNOSIS — M1712 Unilateral primary osteoarthritis, left knee: Secondary | ICD-10-CM | POA: Diagnosis not present

## 2022-10-21 MED ORDER — LIDOCAINE HCL 1 % IJ SOLN
3.0000 mL | INTRAMUSCULAR | Status: AC | PRN
  Administered 2022-10-21: 3 mL

## 2022-10-21 MED ORDER — METHYLPREDNISOLONE ACETATE 40 MG/ML IJ SUSP
40.0000 mg | INTRAMUSCULAR | Status: AC | PRN
  Administered 2022-10-21: 40 mg via INTRA_ARTICULAR

## 2022-10-21 NOTE — Progress Notes (Signed)
Office Visit Note   Patient: [name]           Date of Birth: 07/29/1961           MRN: [id number] Visit Date: 10/21/2022              Requested by: Hayden Rasmussen, MD [address] [place],  [place] [id number] PCP: Hayden Rasmussen, MD   Assessment & Plan: Visit Diagnoses:  1. Chronic pain of left knee   2. Chronic pain of right knee   3. Unilateral primary osteoarthritis, left knee   4. Unilateral primary osteoarthritis, right knee   5. Morbid obesity with BMI of 60.0-69.9, adult ([place])     Plan: The patient does have severe end-stage arthritis in both her knees.  We did talk about her weight and how this is prohibitive for [place] proceeding with any type of joint replacement surgery.  She will still work on a recumbent bike or even a pedaling device to help with strengthening her knees and burning calories.  Obviously we recommended weight loss and she is seeing some type of specialist from a nutrition standpoint and weight loss standpoint that is nonoperative in the near future.  I did recommend a steroid injection in both knees since its been so long since he has had an injection in either knee and given the fact that she is under excellent blood glucose control.  I did place a steroid in both knees without difficulty.  I would like to see her back in 3 months to see how she is doing overall and for repeat weight and BMI calculation.  She does agree with this treatment plan.  All questions and concerns were answered and addressed.  Follow-Up Instructions: Return in about 3 months (around 01/21/2023).   Orders:  Orders Placed This Encounter  Procedures   Large Joint Inj   Large Joint Inj   XR Knee 1-2 Views Right   XR Knee 1-2 Views Left   No orders of the defined types were placed in this encounter.     Procedures: Large Joint Inj: R knee on 10/21/2022 11:21 AM Indications: diagnostic evaluation and pain Details: 22 G 1.5 in needle, superolateral  approach  Arthrogram: No  Medications: 3 mL lidocaine 1 %; 40 mg methylPREDNISolone acetate 40 MG/ML Outcome: tolerated well, no immediate complications Procedure, treatment alternatives, risks and benefits explained, specific risks discussed. Consent was given by the patient. Immediately prior to procedure a time out was called to verify the correct patient, procedure, equipment, support staff and site/side marked as required. Patient was prepped and draped in the usual sterile fashion.    Large Joint Inj: L knee on 10/21/2022 11:21 AM Indications: diagnostic evaluation and pain Details: 22 G 1.5 in needle, superolateral approach  Arthrogram: No  Medications: 3 mL lidocaine 1 %; 40 mg methylPREDNISolone acetate 40 MG/ML Outcome: tolerated well, no immediate complications Procedure, treatment alternatives, risks and benefits explained, specific risks discussed. Consent was given by the patient. Immediately prior to procedure a time out was called to verify the correct patient, procedure, equipment, support staff and site/side marked as required. Patient was prepped and draped in the usual sterile fashion.       Clinical Data: No additional findings.   Subjective: Chief Complaint  Patient presents with   Right Knee - Pain   Left Knee - Pain  The patient is a very pleasant 61 year old female with debilitating pain of both her  knees.  She does ambulate using a cane.  She has seen other orthopedic practice in town but they do not take her insurance anymore.  She has had injections in the past but the last ones were she believes in January of a year ago.  She says they do lock up at night on her and the pain in both knees wakes her up at night.  She is a prediabetic with a good hemoglobin A1c of below 6.  She is morbidly obese with a weight today of 384 pounds and a BMI of 63.9.  HPI  Review of Systems There is no listed fever, chills, nausea, vomiting  Objective: Vital Signs: Ht 5'  5" (1.651 m)   Wt (!) 384 lb (174.2 kg)   BMI 63.90 kg/m   Physical Exam She is alert and orient x3 and in no acute distress.  She does ambulate using a cane. Ortho Exam Examination of both knees shows varus malalignment that is slightly correctable.  She has significant patellofemoral crepitation arc of motion.  There is no effusion that is significant with either knee.  Both knees have pain throughout arc of motion and significant medial and lateral joint line tenderness. Specialty Comments:  No specialty comments available.  Imaging: No results found.   PMFS History: Patient Active Problem List   Diagnosis Date Noted   Gross hematuria 08/10/2020   Sleep apnea 06/03/2020   Paroxysmal atrial fibrillation ([place]) 04/21/2019   Chronic heart failure with preserved ejection fraction (HFpEF) ([place]) 04/21/2019   Type 2 diabetes mellitus without complication, without long-term current use of insulin ([place]) 04/21/2019   Cough 09/06/2018   Dyspnea and respiratory abnormality 09/06/2018   Mild persistent asthma without complication 74/25/9563   Morbid obesity with BMI of 60.0-69.9, adult ([place]) 09/06/2018   Need for influenza vaccination 09/06/2018   Obstructive sleep apnea, adult 09/06/2018   Abnormal stress test 01/25/2018   Current moderate episode of major depressive disorder without prior episode ([place]) 12/31/2017   Low serum vitamin D 12/31/2017   Depression 09/20/2014   Long term current use of anticoagulant therapy 08/29/2014   Long-term use of high-risk medication 08/29/2014   Anxiety 08/23/2014   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 02/2000 MUGA: EF 68%; b. 06/2005 Echo: EF 55-65%; c. 06/2017 Echo: EF 60-65%; d. 08/2020 Echo: EF 60-65%, no rwma, nl RV fxn. Mild BAE. Mild MR.   Cough 09/06/2018   Current moderate episode of major depressive disorder without prior episode ([place]) 12/31/2017   Dependent edema 10/05/2011   Dyspnea and respiratory abnormality 09/06/2018   GE reflux    GERD (gastroesophageal reflux disease) 12/22/1999   Formatting of this note might be different from the original. Controlled by OTC meds   History of breast cancer 10/05/2011   Hypertension 10/05/2011   Long term current use of anticoagulant therapy 08/29/2014   Mild persistent asthma without complication 87/56/4332   Morbid obesity ([place]) 10/05/2011   Morbid obesity with BMI of 60.0-69.9,  adult ([place]) 09/06/2018   Non-obstructive CAD (coronary artery disease)    a. 12/2017 Lexiscan PET/CT: mid anterior, apical lateral, and apical ischemia; b. 01/2018 Cath: LM nl, LAD & LCX 10-30% diff dzs throughout, RCA min irregs (<10%)-->Med rx.   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 Collene [place]ares, [place] [facility]    COLONOSCOPY WITH PROPOFOL N/A 11/28/2021   Procedure: COLONOSCOPY WITH PROPOFOL;  Surgeon: Carol Ada, MD;  Location: [facility] ENDOSCOPY;  Service: Endoscopy;  Laterality: N/A;   FINGER SURGERY Right    MASTECTOMY Right    Social History   Occupational History   Not on file  Tobacco Use   Smoking status: Former    Packs/day: 0.30    Years: 4.00    Total pack years: 1.20    Types: Cigarettes    Quit date: 12/22/1983    Years since quitting: 38.8   Smokeless tobacco: Never  Vaping Use   Vaping Use: Never used  Substance and Sexual Activity   Alcohol use: Not Currently   Drug use: No   Sexual activity: Not on file

## 2022-10-30 ENCOUNTER — Emergency Department
Admission: EM | Admit: 2022-10-30 | Discharge: 2022-10-30 | Disposition: A | Discharge status: Home / Self Care | Payer: 59 | Attending: Student in an Organized Health Care Education/Training Program | Admitting: Student in an Organized Health Care Education/Training Program

## 2022-10-30 ENCOUNTER — Emergency Department: Payer: 59

## 2022-10-30 ENCOUNTER — Other Ambulatory Visit: Payer: Self-pay

## 2022-10-30 DIAGNOSIS — R0789 Other chest pain: Secondary | ICD-10-CM | POA: Insufficient documentation

## 2022-10-30 DIAGNOSIS — R55 Syncope and collapse: Secondary | ICD-10-CM | POA: Diagnosis not present

## 2022-10-30 DIAGNOSIS — R002 Palpitations: Secondary | ICD-10-CM | POA: Insufficient documentation

## 2022-10-30 DIAGNOSIS — R0689 Other abnormalities of breathing: Secondary | ICD-10-CM | POA: Diagnosis not present

## 2022-10-30 DIAGNOSIS — I4891 Unspecified atrial fibrillation: Secondary | ICD-10-CM | POA: Diagnosis not present

## 2022-10-30 DIAGNOSIS — T68XXXA Hypothermia, initial encounter: Secondary | ICD-10-CM | POA: Diagnosis not present

## 2022-10-30 DIAGNOSIS — Z7901 Long term (current) use of anticoagulants: Secondary | ICD-10-CM | POA: Diagnosis not present

## 2022-10-30 DIAGNOSIS — I480 Paroxysmal atrial fibrillation: Secondary | ICD-10-CM | POA: Diagnosis not present

## 2022-10-30 DIAGNOSIS — R42 Dizziness and giddiness: Secondary | ICD-10-CM | POA: Diagnosis not present

## 2022-10-30 DIAGNOSIS — Z743 Need for continuous supervision: Secondary | ICD-10-CM | POA: Diagnosis not present

## 2022-10-30 LAB — BASIC METABOLIC PANEL
Anion gap: 10 (ref 5–15)
BUN: 13 mg/dL (ref 8–23)
CO2: 24 mmol/L (ref 22–32)
Calcium: 8.5 mg/dL — ABNORMAL LOW (ref 8.9–10.3)
Chloride: 104 mmol/L (ref 98–111)
Creatinine, Ser: 0.63 mg/dL (ref 0.44–1.00)
GFR, Estimated: >60 mL/min (ref >60)
Glucose, Bld: 102 mg/dL — ABNORMAL HIGH (ref 70–99)
Potassium: 3.7 mmol/L (ref 3.5–5.1)
Sodium: 138 mmol/L (ref 135–145)

## 2022-10-30 LAB — CBC
HCT: 38.9 % (ref 36.0–46.0)
Hemoglobin: 12.7 g/dL (ref 12.0–15.0)
MCH: 28.2 pg (ref 26.0–34.0)
MCHC: 32.6 g/dL (ref 30.0–36.0)
MCV: 86.3 fL (ref 80.0–100.0)
Platelets: 378 10*3/uL (ref 150–400)
RBC: 4.51 MIL/uL (ref 3.87–5.11)
RDW: 14.9 % (ref 11.5–15.5)
WBC: 16.1 10*3/uL — ABNORMAL HIGH (ref 4.0–10.5)
nRBC: 0 % (ref 0.0–0.2)

## 2022-10-30 LAB — TSH: TSH: 1.401 u[IU]/mL (ref 0.350–4.500)

## 2022-10-30 LAB — CBG MONITORING, ED: Glucose-Capillary: 95 mg/dL (ref 70–99)

## 2022-10-30 LAB — TROPONIN I (HIGH SENSITIVITY): Troponin I (High Sensitivity): 3 ng/L (ref <18)

## 2022-10-30 MED ORDER — FLECAINIDE ACETATE 100 MG PO TABS: 100.0000 mg | ORAL_TABLET | Freq: Two times a day (BID) | ORAL | 1 refills | Status: DC

## 2022-10-30 NOTE — ED Triage Notes (Signed)
Pt from home via EMS. Pt states she went to fire station due to lightheadedness/ fluttering sensation. Pt took a xanax and BP meds at 11 am today. Pt AOX4, NAD noted. HR was 140-150's initially and came down to 110 en route. BP 110/86, 100% RA.

## 2022-10-30 NOTE — Discharge Instructions (Addendum)
As we discussed, double your Flecainide dose from '50mg'$  twice daily to '100mg'$  twice daily.  Follow up with cardiology Monday.  Return for any worsening symptoms, concerns or questions.

## 2022-10-30 NOTE — ED Provider Notes (Signed)
[facility] Provider Note    Event Date/Time   First MD Initiated Contact with Patient 10/30/22 1531     (approximate)   History   Near Syncope   HPI  [name] is a 61 y.o. female with a history of A-fib on Eliquis flecainide and verapamil presents to the ER for evaluation of palpitations and some discomfort in her chest that occurred around 11 AM today.  Felt like her heart was skipping beats and irregular and sometimes tachycardic in the 120s.  She did feel lightheaded but never lost consciousness.  At rest she feels well.  She is been compliant with all of her medications no recent fevers or chills.  No cough or congestion.     Physical Exam   Triage Vital Signs: ED Triage Vitals  Enc Vitals Group     BP 10/30/22 1519 110/70     Pulse Rate 10/30/22 1519 84     Resp 10/30/22 1519 18     Temp 10/30/22 1519 97.6 F (36.4 C)     Temp Source 10/30/22 1519 Oral     SpO2 10/30/22 1519 97 %     Weight 10/30/22 1516 (!) 385 lb 12.9 oz (175 kg)     Height 10/30/22 1516 '5\' 5"'$  (1.651 m)     Head Circumference --      Peak Flow --      Pain Score 10/30/22 1516 0     Pain Loc --      Pain Edu? --      Excl. in [place]? --     Most recent vital signs: Vitals:   10/30/22 1519  BP: 110/70  Pulse: 84  Resp: 18  Temp: 97.6 F (36.4 C)  SpO2: 97%     Constitutional: Alert  Eyes: Conjunctivae are normal.  Head: Atraumatic. Nose: No congestion/rhinnorhea. Mouth/Throat: Mucous membranes are moist.   Neck: Painless ROM.  Cardiovascular:   Good peripheral circulation. Irregularly irregular Respiratory: Normal respiratory effort.  No retractions.  Gastrointestinal: Soft and nontender.  Musculoskeletal:  no deformity Neurologic:  MAE spontaneously. No gross focal neurologic deficits are appreciated.  Skin:  Skin is warm, dry and intact. No rash noted. Psychiatric: Mood and affect are normal. Speech and behavior are normal.    ED Results /  Procedures / Treatments   Labs (all labs ordered are listed, but only abnormal results are displayed) Labs Reviewed  BASIC METABOLIC PANEL - Abnormal; Notable for the following components:      Result Value   Glucose, Bld 102 (*)    Calcium 8.5 (*)    All other components within normal limits  CBC - Abnormal; Notable for the following components:   WBC 16.1 (*)    All other components within normal limits  TSH  URINALYSIS, ROUTINE W REFLEX MICROSCOPIC  CBG MONITORING, ED  TROPONIN I (HIGH SENSITIVITY)  TROPONIN I (HIGH SENSITIVITY)     EKG  ED ECG REPORT I, Merlyn Lot, the attending physician, personally viewed and interpreted this ECG.   Date: 10/30/2022  EKG Time: 15:23  Rate: 95  Rhythm: afib  Axis: normal  Intervals: normal qt  ST&T Change: no stemi, no depressions    RADIOLOGY Please see ED Course for my review and interpretation.  I personally reviewed all radiographic images ordered to evaluate for the above acute complaints and reviewed radiology reports and findings.  These findings were personally discussed with the patient.  Please see medical record for radiology  report.    PROCEDURES:  Critical Care performed: No  Procedures   MEDICATIONS ORDERED IN ED: Medications - No data to display   IMPRESSION / MDM / [place] / ED COURSE  I reviewed the triage vital signs and the nursing notes.                              Differential diagnosis includes, but is not limited to, dysrhythmia, electrolyte abnormality, CHF, pneumonia, ACS, hypothyroid  Patient presenting to the ER for evaluation of symptoms as described above.  Based on symptoms, risk factors and considered above differential, this presenting complaint could reflect a potentially life-threatening illness therefore the patient will be placed on continuous pulse oximetry and telemetry for monitoring.  Laboratory evaluation will be sent to evaluate for the above complaints.       Clinical Course as of 10/30/22 1744  Fri Oct 30, 2022  1626 Chest x-ray on my review and rotation does not show any evidence of consolidation or effusion. [PR]  1741 Patient reassessed.  Remains well-appearing in no acute distress.  Troponin negative.  Electrolytes normal.  Mild leukocytosis but no fever no findings to suggest pneumonia.  She denies any chest pain shortness of breath.  Despite her symptoms secondary to symptomatic A-fib with probable RVR prior to arrival.  She remains rate controlled now well-appearing.  I discussed case in consultation with Dr. Rockey Situ of cardiology.  He has recommended increasing her flecainide.  We discussed option for as needed metoprolol but patient states that she did not tolerate this well previously and is agreeable to the plan for increasing her flecainide and close outpatient follow-up with cardiology on Monday.  Discussed signs and symptoms for which she should return to the ER. [PR]    Clinical Course User Index [PR] Merlyn Lot, MD    FINAL CLINICAL IMPRESSION(S) / ED DIAGNOSES   Final diagnoses:  Light headedness  Atrial fibrillation, unspecified type [facility])     Rx / DC Orders   ED Discharge Orders     None        Note:  This document was prepared using Dragon voice recognition software and may include unintentional dictation errors.    Merlyn Lot, MD 10/30/22 1745

## 2022-10-30 NOTE — ED Notes (Signed)
E signature pad not working. Pt educated on discharge instructions and verbalized understanding.  

## 2022-11-02 ENCOUNTER — Encounter: Payer: Self-pay | Admitting: Internal Medicine

## 2022-11-08 ENCOUNTER — Other Ambulatory Visit: Payer: Self-pay

## 2022-11-08 ENCOUNTER — Emergency Department: Payer: 59

## 2022-11-08 ENCOUNTER — Emergency Department
Admission: EM | Admit: 2022-11-08 | Discharge: 2022-11-08 | Disposition: A | Discharge status: Home / Self Care | Payer: 59 | Attending: Emergency Medicine | Admitting: Emergency Medicine

## 2022-11-08 DIAGNOSIS — I4891 Unspecified atrial fibrillation: Secondary | ICD-10-CM | POA: Diagnosis not present

## 2022-11-08 DIAGNOSIS — Z743 Need for continuous supervision: Secondary | ICD-10-CM | POA: Diagnosis not present

## 2022-11-08 DIAGNOSIS — J811 Chronic pulmonary edema: Secondary | ICD-10-CM | POA: Diagnosis not present

## 2022-11-08 DIAGNOSIS — Z20822 Contact with and (suspected) exposure to covid-19: Secondary | ICD-10-CM | POA: Insufficient documentation

## 2022-11-08 DIAGNOSIS — Z7901 Long term (current) use of anticoagulants: Secondary | ICD-10-CM | POA: Diagnosis not present

## 2022-11-08 DIAGNOSIS — R609 Edema, unspecified: Secondary | ICD-10-CM

## 2022-11-08 DIAGNOSIS — J810 Acute pulmonary edema: Secondary | ICD-10-CM | POA: Diagnosis not present

## 2022-11-08 DIAGNOSIS — R0789 Other chest pain: Secondary | ICD-10-CM

## 2022-11-08 DIAGNOSIS — R079 Chest pain, unspecified: Secondary | ICD-10-CM | POA: Diagnosis not present

## 2022-11-08 DIAGNOSIS — D72829 Elevated white blood cell count, unspecified: Secondary | ICD-10-CM | POA: Insufficient documentation

## 2022-11-08 DIAGNOSIS — R600 Localized edema: Secondary | ICD-10-CM

## 2022-11-08 DIAGNOSIS — R064 Hyperventilation: Secondary | ICD-10-CM | POA: Diagnosis not present

## 2022-11-08 DIAGNOSIS — R0602 Shortness of breath: Secondary | ICD-10-CM | POA: Insufficient documentation

## 2022-11-08 DIAGNOSIS — R0989 Other specified symptoms and signs involving the circulatory and respiratory systems: Secondary | ICD-10-CM

## 2022-11-08 LAB — BASIC METABOLIC PANEL
Anion gap: 10 (ref 5–15)
BUN: 13 mg/dL (ref 8–23)
CO2: 29 mmol/L (ref 22–32)
Calcium: 9 mg/dL (ref 8.9–10.3)
Chloride: 102 mmol/L (ref 98–111)
Creatinine, Ser: 0.68 mg/dL (ref 0.44–1.00)
GFR, Estimated: >60 mL/min (ref >60)
Glucose, Bld: 95 mg/dL (ref 70–99)
Potassium: 3.2 mmol/L — ABNORMAL LOW (ref 3.5–5.1)
Sodium: 141 mmol/L (ref 135–145)

## 2022-11-08 LAB — CBC
HCT: 38.5 % (ref 36.0–46.0)
Hemoglobin: 12.5 g/dL (ref 12.0–15.0)
MCH: 27.8 pg (ref 26.0–34.0)
MCHC: 32.5 g/dL (ref 30.0–36.0)
MCV: 85.6 fL (ref 80.0–100.0)
Platelets: 314 10*3/uL (ref 150–400)
RBC: 4.5 MIL/uL (ref 3.87–5.11)
RDW: 14.5 % (ref 11.5–15.5)
WBC: 13.2 10*3/uL — ABNORMAL HIGH (ref 4.0–10.5)
nRBC: 0 % (ref 0.0–0.2)

## 2022-11-08 LAB — RESP PANEL BY RT-PCR (FLU A&B, COVID) ARPGX2
Influenza A by PCR: NEGATIVE
Influenza B by PCR: NEGATIVE
SARS Coronavirus 2 by RT PCR: NEGATIVE

## 2022-11-08 LAB — BRAIN NATRIURETIC PEPTIDE: B Natriuretic Peptide: 38.3 pg/mL (ref 0.0–100.0)

## 2022-11-08 LAB — TROPONIN I (HIGH SENSITIVITY)
Troponin I (High Sensitivity): 4 ng/L (ref <18)
Troponin I (High Sensitivity): 5 ng/L (ref <18)

## 2022-11-08 MED ORDER — POTASSIUM CHLORIDE CRYS ER 20 MEQ PO TBCR
40.0000 meq | EXTENDED_RELEASE_TABLET | Freq: Once | ORAL | Status: AC
  Administered 2022-11-08: 40 meq via ORAL
  Filled 2022-11-08: qty 2

## 2022-11-08 MED ORDER — FUROSEMIDE 10 MG/ML IJ SOLN
40.0000 mg | Freq: Once | INTRAMUSCULAR | Status: AC
  Administered 2022-11-08: 40 mg via INTRAVENOUS
  Filled 2022-11-08: qty 4

## 2022-11-08 NOTE — ED Triage Notes (Signed)
Pt presents to the ED via EMS due to Chest pressure that started after checking her BP. Pt  states she repeatedly took her BP to see if it will go down but it did not. Pt then states she took her xanax to help and '325mg'$  ASA. Pt has hx anxiety and hypertension. Pt states she was recently taken off her anxiety medications. Pt denies CP and N/V/D. Pt A&Ox4.

## 2022-11-08 NOTE — ED Notes (Signed)
Pt with CP at 2/10. Pt alert and oriented, pt SOB with ambulation, but states this is not new. Pt with hx of CHF.

## 2022-11-08 NOTE — ED Provider Notes (Signed)
[facility] Provider Note    Event Date/Time   First MD Initiated Contact with Patient 11/08/22 1423     (approximate)   History   Chest Pain   HPI  [name] is a 61 y.o. female history of atrial fibrillation on Eliquis takes flecainide and verapamil.  She does not have a history of coronary disease  Today after church patient checked her blood pressure at home and noted it was elevated to 154 which is higher than her typical 1 30-1 40.  At about the same time she reports that she started feeling a slight sensation hard to describe behind her breastbone.  No shortness of breath.  She denies that it is painful, but reports rather just can of a strange sensation.  She is experienced it before due to "A-fib" but did not notice any racing heart or palpitations  She has noticed a little bit of increased swelling in both of her lower feet and it will come and go for the last several months and she typically takes furosemide for it.  She was seen in the ER when she had a episode of lightheadedness not long ago and increased her furosemide dose for 3 days, and is now back on her typical dosing  She continues to take her blood thinner daily     Physical Exam   Triage Vital Signs: ED Triage Vitals  Enc Vitals Group     BP 11/08/22 1337 (!) 144/65     Pulse Rate 11/08/22 1337 75     Resp 11/08/22 1337 18     Temp 11/08/22 1337 97.7 F (36.5 C)     Temp Source 11/08/22 1337 Oral     SpO2 11/08/22 1337 93 %     Weight 11/08/22 1338 (!) 385 lb 12.9 oz (175 kg)     Height 11/08/22 1338 '5\' 5"'$  (1.651 m)     Head Circumference --      Peak Flow --      Pain Score 11/08/22 1338 0     Pain Loc --      Pain Edu? --      Excl. in [place]? --     Most recent vital signs: Vitals:   11/08/22 1545 11/08/22 1730  BP:  (!) 157/77  Pulse: 68 74  Resp: 19 19  Temp:  97.7 F (36.5 C)  SpO2:  96%     General: Awake, no distress.  Very pleasant No obvious jugular  venous distention, hard to gauge though due to neck physiology/thickness CV:  Good peripheral perfusion.  Normal heart tones.  No irregularity.  Tones are normal and regular at this time.  No murmur Resp:  Normal effort.  Normal work of breathing.  Clear lungs.  No noted Klier Ackles or rales Abd:  No distention.  Soft nontender nondistended. Other:  Trace pitting lower extremity edema bilateral, very mild and patient is also wearing compression stockings.   ED Results / Procedures / Treatments   Labs (all labs ordered are listed, but only abnormal results are displayed) Labs Reviewed  BASIC METABOLIC PANEL - Abnormal; Notable for the following components:      Result Value   Potassium 3.2 (*)    All other components within normal limits  CBC - Abnormal; Notable for the following components:   WBC 13.2 (*)    All other components within normal limits  RESP PANEL BY RT-PCR (FLU A&B, COVID) ARPGX2  BRAIN NATRIURETIC PEPTIDE  TROPONIN I (HIGH SENSITIVITY)  TROPONIN I (HIGH SENSITIVITY)     EKG  Reviewed inter by me at 1345 heart rate 80 QRS 85 QTc 440 Normal sinus rhythm with a short PR interval.  Patient does have a history of atrial fibrillation but this appears quite regular, favor sinus rhythm.  There is mild nonspecific T wave abnormality accompanying which is SIMILAR in nature to 10/30/22   RADIOLOGY  Chest x-ray interpreted by me is suspicious for mild vascular congestion    PROCEDURES:  Critical Care performed: No  Procedures   MEDICATIONS ORDERED IN ED: Medications  furosemide (LASIX) injection 40 mg (40 mg Intravenous Given 11/08/22 1557)  potassium chloride SA (KLOR-CON M) CR tablet 40 mEq (40 mEq Oral Given 11/08/22 1559)     IMPRESSION / MDM / ASSESSMENT AND PLAN / ED COURSE  I reviewed the triage vital signs and the nursing notes.                              Differential diagnosis includes, but is not limited to, evaluation to exclude ACS, CHF,  pneumonia, pneumothorax, arrhythmia, electrolyte imbalance or abnormality, chondritis etc.  Reassuring that the patient has no associated significant sharp chest pains or pressures, reports rather strange hard to define discomfort or strange feeling behind her breastbone that is hard to categorize.  Atypical of ACS or pulmonary embolism.  She is actively anticoagulated decreasing her risk for PE and she has no tachycardia and denies any chest pain does not have any unilateral exam findings to be suggestive of thromboembolism etc.  She does have trace peripheral edema, but no known history of CHF but he has had some imaging findings that been concerning for possible volume overload.  She does also appear to have evidence of mild volume overload today.  We will trial Lasix and given her elevated blood pressure from baseline and may be that she is slightly volume overloaded.  She has had 2 troponins now both normal, making ACS highly unlikely.  Very low risk at this time.  Atypical symptoms.  Viral studies negative for pathology.  Labs reassuring, potassium repleted.  Very mild leukocytosis but no associated fever or productive cough, doubt acute bacterial pneumonia.  She also seems to have a history of slightly elevated white blood cell count at baseline.  Patient's presentation is most consistent with acute complicated illness / injury requiring diagnostic workup.  The patient is on the cardiac monitor to evaluate for evidence of arrhythmia and/or significant heart rate changes.  Vitals:   11/08/22 1545 11/08/22 1730  BP:  (!) 157/77  Pulse: 68 74  Resp: 19 19  Temp:  97.7 F (36.5 C)  SpO2:  96%   I do believe the patient has a small amount of volume overload and peripheral edema.  Discussed with her and she will plan to take an extra dose of her furosemide, 40 mg daily for the next 2 days.  She will be following up closely and calling Dr. Darnelle Bos office, I have also placed an urgent request for  follow-up with our CHF clinic  Return precautions and treatment recommendations and follow-up discussed with the patient who is agreeable with the plan.      FINAL CLINICAL IMPRESSION(S) / ED DIAGNOSES   Final diagnoses:  Atypical chest pain  Pulmonary vascular congestion  Peripheral edema     Rx / DC Orders   ED Discharge Orders  Ordered    AMB referral to CHF clinic       Comments: ER follow-up. Feel patient needs assessment and an ECHO.   11/08/22 1740             Note:  This document was prepared using Dragon voice recognition software and may include unintentional dictation errors.   Delman Kitten, MD 11/08/22 1744

## 2022-11-09 ENCOUNTER — Encounter: Payer: Self-pay | Admitting: Internal Medicine

## 2022-11-09 NOTE — Telephone Encounter (Signed)
LVM with patient in attempt to get her scheduled for a new patient appointment with the [facility] after receiving a referral.   Kortlynn Poust, NT

## 2022-11-15 DIAGNOSIS — G4733 Obstructive sleep apnea (adult) (pediatric): Secondary | ICD-10-CM | POA: Diagnosis not present

## 2022-11-16 DIAGNOSIS — G4733 Obstructive sleep apnea (adult) (pediatric): Secondary | ICD-10-CM | POA: Diagnosis not present

## 2022-11-20 ENCOUNTER — Other Ambulatory Visit
Admission: RE | Admit: 2022-11-20 | Discharge: 2022-11-20 | Disposition: A | Discharge status: Home / Self Care | Payer: 59 | Source: Ambulatory Visit | Attending: Nurse Practitioner | Admitting: Nurse Practitioner

## 2022-11-20 ENCOUNTER — Encounter: Payer: Self-pay | Admitting: Nurse Practitioner

## 2022-11-20 ENCOUNTER — Ambulatory Visit: Payer: 59 | Attending: Nurse Practitioner | Admitting: Nurse Practitioner

## 2022-11-20 ENCOUNTER — Encounter: Payer: 59 | Admitting: Family

## 2022-11-20 VITALS — BP 122/73 | HR 71 | Ht 65.0 in | Wt 367.0 lb

## 2022-11-20 DIAGNOSIS — I480 Paroxysmal atrial fibrillation: Secondary | ICD-10-CM

## 2022-11-20 DIAGNOSIS — E119 Type 2 diabetes mellitus without complications: Secondary | ICD-10-CM | POA: Diagnosis not present

## 2022-11-20 DIAGNOSIS — I5032 Chronic diastolic (congestive) heart failure: Secondary | ICD-10-CM

## 2022-11-20 DIAGNOSIS — E876 Hypokalemia: Secondary | ICD-10-CM

## 2022-11-20 DIAGNOSIS — I10 Essential (primary) hypertension: Secondary | ICD-10-CM | POA: Diagnosis not present

## 2022-11-20 DIAGNOSIS — R072 Precordial pain: Secondary | ICD-10-CM | POA: Diagnosis not present

## 2022-11-20 LAB — BASIC METABOLIC PANEL
Anion gap: 10 (ref 5–15)
BUN: 42 mg/dL — ABNORMAL HIGH (ref 8–23)
CO2: 29 mmol/L (ref 22–32)
Calcium: 9.3 mg/dL (ref 8.9–10.3)
Chloride: 100 mmol/L (ref 98–111)
Creatinine, Ser: 0.95 mg/dL (ref 0.44–1.00)
GFR, Estimated: >60 mL/min (ref >60)
Glucose, Bld: 112 mg/dL — ABNORMAL HIGH (ref 70–99)
Potassium: 4.1 mmol/L (ref 3.5–5.1)
Sodium: 139 mmol/L (ref 135–145)

## 2022-11-20 MED ORDER — METOPROLOL TARTRATE 100 MG PO TABS: 100.0000 mg | ORAL_TABLET | Freq: Once | ORAL | 0 refills | Status: DC

## 2022-11-20 NOTE — Patient Instructions (Addendum)
Medication Instructions:  Take metoprolol tartrate 100 mg by mouth 2 hours prior to cardiac CT.  Your physician recommends that you continue on your current medications as directed. Please refer to the Current Medication list given to you today.  *If you need a refill on your cardiac medications before your next appointment, please call your pharmacy*   Lab Work: BMP today - Please go to the [facility]. You will check in at the front desk to the right as you walk into the atrium. Valet Parking is offered if needed. - No appointment needed. You may go any day between 7 am and 6 pm.  If you have labs (blood work) drawn today and your tests are completely normal, you will receive your results only by: [place] (if you have MyChart) OR A paper copy in the mail If you have any lab test that is abnormal or we need to change your treatment, we will call you to review the results.   Testing/Procedures:   Your cardiac CT will be scheduled at one of the below locations:   [facility] [address] [place], [place] [address] (336) [place] [address] [place], [place] [address] [phone]  [facility] [place], [place] [address] [phone]  If scheduled at [facility], please arrive at the [facility] Entrance (Entrance C2) of [facility] 30 minutes prior to test start time. You can use the FREE valet parking offered at entrance C (encouraged to control the heart rate for the test)  Proceed to the [facility] Radiology Department (first floor) to check-in and test prep.  All radiology patients and guests should use entrance C2 at [facility], accessed from [facility], even though the hospital's physical address listed is [address]    If scheduled at [facility] or [facility], please arrive 15 mins early for check-in and test prep.   Please follow these instructions carefully (unless otherwise directed):  Hold all erectile dysfunction medications at least 3 days (72 hrs) prior to test. (Ie viagra, cialis, sildenafil, tadalafil, etc) We will administer nitroglycerin during this exam.   On the Night Before the Test: Be sure to Drink plenty of water. Do not consume any caffeinated/decaffeinated beverages or chocolate 12 hours prior to your test. Do not take any antihistamines 12 hours prior to your test. If the patient has contrast allergy: Patient will need a prescription for Prednisone and very clear instructions (as follows): Prednisone 50 mg - take 13 hours prior to test Take another Prednisone 50 mg 7 hours prior to test Take another Prednisone 50 mg 1 hour prior to test Take Benadryl 50 mg 1 hour prior to test Patient must complete all four doses of above prophylactic medications. Patient will need a ride after test due to Benadryl.  On the Day of the Test: Drink plenty of water until 1 hour prior to the test. Do not eat any food 1 hour prior to test. You may take your regular medications prior to the test.  Take metoprolol (Lopressor) two hours prior to test. HOLD Furosemide/Hydrochlorothiazide morning of the test. FEMALES- please wear underwire-free bra if available, avoid dresses & tight clothing  After the Test: Drink plenty of water. After receiving IV contrast, you may experience a mild flushed feeling. This is normal. On occasion,  you may experience a mild rash up to 24 hours after the test. This is not dangerous. If this occurs, you can take Benadryl 25 mg and increase your fluid intake. If you experience trouble breathing, this can be serious. If it is severe call 911 IMMEDIATELY. If it is mild, please call our office. If you take any of these medications: Glipizide/Metformin, Avandament, Glucavance,  please do not take 48 hours after completing test unless otherwise instructed.  We will call to schedule your test 2-4 weeks out understanding that some insurance companies will need an authorization prior to the service being performed.   For non-scheduling related questions, please contact the cardiac imaging nurse navigator should you have any questions/concerns: Marchia Bond, Cardiac Imaging Nurse Navigator Gordy Clement, Cardiac Imaging Nurse Navigator [facility] Direct Office Dial: [phone]   For scheduling needs, including cancellations and rescheduling, please call [place], [phone].    Follow-Up: At [facility], you and your health needs are our priority.  As part of our continuing mission to provide you with exceptional heart care, we have created designated Provider Care Teams.  These Care Teams include your primary Cardiologist (physician) and Advanced Practice Providers (APPs -  Physician Assistants and Nurse Practitioners) who all work together to provide you with the care you need, when you need it.  We recommend signing up for the patient portal called "MyChart".  Sign up information is provided on this After Visit Summary.  MyChart is used to connect with patients for Virtual Visits (Telemedicine).  Patients are able to view lab/test results, encounter notes, upcoming appointments, etc.  Non-urgent messages can be sent to your provider as well.   To learn more about what you can do with MyChart, go to [email or web address].    Your next appointment:    Keep scheduled follow-up in January 2024.  The format for your next appointment:   In Person  Provider:   You may see Nelva Bush, MD or one of the following Advanced Practice Providers on your designated Care Team:   Murray Hodgkins, NP Christell Faith, PA-C Cadence Kathlen Mody, PA-C Gerrie Nordmann, NP  Important Information About Sugar

## 2022-11-20 NOTE — Progress Notes (Signed)
Office Visit    Patient Name: NARALY FRITCHER Date of Encounter: 11/20/2022  Primary Care Provider:  Hayden Rasmussen, MD Primary Cardiologist:  Nelva Bush, MD  Chief Complaint    61 year old female with history of chronic HFpEF, nonobstructive CAD, chronic lower extremity edema, paroxysmal atrial fibrillation, hypertension, sleep apnea, diabetes, breast cancer, and obesity, presents for follow-up related to chest pain.  Past Medical History    Past Medical History:  Diagnosis Date   Anxiety 08/23/2014   Asthma    Atrial fibrillation ([place]) 06/2014   Breast cancer ([place])    Chronic heart failure with preserved ejection fraction (HFpEF) ([place])    a. 02/2000 MUGA: EF 68%; b. 06/2005 Echo: EF 55-65%; c. 06/2017 Echo: EF 60-65%; d. 08/2020 Echo: EF 60-65%, no rwma, nl RV fxn. Mild BAE. Mild MR.   Cough 09/06/2018   Current moderate episode of major depressive disorder without prior episode ([place]) 12/31/2017   Dependent edema 10/05/2011   Dyspnea and respiratory abnormality 09/06/2018   GE reflux    GERD (gastroesophageal reflux disease) 12/22/1999   Formatting of this note might be different from the original. Controlled by OTC meds   History of breast cancer 10/05/2011   Hypertension 10/05/2011   Long term current use of anticoagulant therapy 08/29/2014   Mild persistent asthma without complication 54/65/0354   Morbid obesity ([place]) 10/05/2011   Morbid obesity with BMI of 60.0-69.9, adult ([place]) 09/06/2018   Non-obstructive CAD (coronary artery disease)    a. 12/2017 Lexiscan PET/CT: mid anterior, apical lateral, and apical ischemia; b. 01/2018 Cath: LM nl, LAD & LCX 10-30% diff dzs throughout, RCA min irregs (<10%)-->Med rx.   Obstructive sleep apnea, adult 09/06/2018   Paroxysmal atrial fibrillation ([place]) 04/21/2019   Sleep apnea 06/03/2020   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/2014   Type 2 diabetes mellitus without complication, without long-term current use of insulin ([place])  04/21/2019   Urinary bladder incontinence 06/13/2013   Past Surgical History:  Procedure Laterality Date   CARDIAC CATHETERIZATION  02/16/2018   No stents;Dr. Collene [place]ares, [place] [facility]   COLONOSCOPY WITH PROPOFOL N/A 11/28/2021   Procedure: COLONOSCOPY WITH PROPOFOL;  Surgeon: Carol Ada, MD;  Location: [facility] ENDOSCOPY;  Service: Endoscopy;  Laterality: N/A;   FINGER SURGERY Right    MASTECTOMY Right     Allergies  Allergies  Allergen Reactions   Erythromycin Diarrhea    History of Present Illness    61 year old female with the above past medical history including chronic HFpEF, chronic lower extremity edema, nonobstructive CAD, paroxysmal atrial fibrillation, hypertension, sleep apnea, diabetes, breast cancer, and obesity.  In the setting of remote breast cancer diagnosis, she had a MUGA scan in 2001, which showed normal LV function.  Subsequent echo July 2006 showed an EF of 55 to 65%.  She was diagnosed with paroxysmal atrial fibrillation in July 2015, at which time she experienced palpitations and was hospitalized in [place], where she was at the time.  She spontaneously converted to sinus rhythm and has been on Eliquis (CHA2DS2-VASc equals 5) and flecainide since.  2019, she underwent stress testing, which showed mid anterior, apical lateral, and apical ischemia.  She has been medically managed.  After moving to [facility], she established care with Dr. Saunders Revel in May 2020.  She underwent echocardiogram in September 2021 which showed an EF of 60 to 65% with mild MR.  She was last seen in cardiology clinic in September 2023, at which time weight was up  32 pounds in the setting of losing her mother and subsequent low energy and depression.  No medication changes were made.    Since her last office visit, she has had 3 ED visits: 1) October 23-seen for chest pain and anxiety.  Chest x-ray suggested mild edema and she was advised to increase Lasix to 60 mg daily for 3 days  and follow-up with her outpatient providers.  2) November 10th after experiencing irregular tachypalpitations, lightheadedness, and chest discomfort.  She was found to be in atrial fibrillation in the ED and her flecainide was increased to 100 mg twice daily.  3) November 19, with complaints of mild discomfort in her chest and elevated blood pressure.  She was felt to be mild volume overloaded and chest x-ray suggested pulmonary edema, and she was given Lasix.  Troponins were normal.  She has not had any recurrent chest pain.  She has follow-up with her primary care provider was initially placed on sertraline as she believes her symptoms may be related to stress and panic.  This was subsequently discontinued given concomitant flecainide therapy.  She was prescribed clonazepam today.  She does not typically experience dyspnea on exertion and has not had any recurrent palpitations since the November 10 ED visit.  She denies PND, orthopnea, dizziness, syncope, or early satiety.  She sometimes has mild lower extremity swelling.  Home Medications    Current Outpatient Medications  Medication Sig Dispense Refill   albuterol (PROVENTIL HFA;VENTOLIN HFA) 108 (90 Base) MCG/ACT inhaler Inhale 2 puffs into the lungs every 6 (six) hours as needed for wheezing or shortness of breath. 1 Inhaler 2   apixaban (ELIQUIS) 5 MG TABS tablet Take 1 tablet (5 mg total) by mouth every 12 (twelve) hours. 60 tablet 11   Cholecalciferol (VITAMIN D3) 75 MCG (3000 UT) TABS Take 3,000 mg by mouth daily.     clonazePAM (KLONOPIN) 0.5 MG tablet Take 0.5 mg by mouth daily.     fexofenadine (ALLEGRA) 180 MG tablet Take 180 mg by mouth daily.     flecainide (TAMBOCOR) 100 MG tablet Take 1 tablet (100 mg total) by mouth 2 (two) times daily. 60 tablet 1   fluticasone (FLONASE) 50 MCG/ACT nasal spray Place 1 spray into both nostrils daily as needed for allergies.     furosemide (LASIX) 40 MG tablet Take 40 mg by mouth daily.      lamoTRIgine (LAMICTAL) 100 MG tablet Take 25 mg by mouth every 12 (twelve) hours.     levothyroxine (SYNTHROID) 50 MCG tablet Take 50 mcg by mouth daily before breakfast.     lisinopril (PRINIVIL,ZESTRIL) 40 MG tablet Take 40 mg by mouth daily.     metFORMIN (GLUCOPHAGE) 1000 MG tablet Take 1,000 mg by mouth every 12 (twelve) hours.     metoprolol tartrate (LOPRESSOR) 100 MG tablet Take 1 tablet (100 mg total) by mouth once for 1 dose. Take 2 hours prior to cardiac CT 1 tablet 0   montelukast (SINGULAIR) 10 MG tablet Take 10 mg by mouth at bedtime.     omeprazole (PRILOSEC) 20 MG capsule Take 20 mg by mouth every 12 (twelve) hours.     Prenatal Vit-Fe Fumarate-FA (PRENATAL MULTIVITAMIN) TABS tablet Take 1 tablet by mouth daily. With iron     verapamil (CALAN-SR) 180 MG CR tablet Take 2 tablets (360 mg total) by mouth daily. 180 tablet 3   ALPRAZolam (XANAX) 0.5 MG tablet Take 0.25-0.5 mg by mouth daily as needed. (Patient not taking: Reported  on 11/20/2022)     No current facility-administered medications for this visit.     Review of Systems    Chest pain episodes as outlined above along with an episode of palpitations.  She has some degree of mild chronic ankle edema.  She denies PND, orthopnea, dizziness, syncope, or early satiety.  All other systems reviewed and are otherwise negative except as noted above.    Physical Exam    VS:  BP 122/73 (BP Location: Left Arm, Patient Position: Sitting, Cuff Size: Normal)   Pulse 71   Ht '5\' 5"'$  (1.651 m)   Wt (!) 367 lb (166.5 kg)   SpO2 99%   BMI 61.07 kg/m  , BMI Body mass index is 61.07 kg/m.     GEN: Well nourished, well developed, in no acute distress. HEENT: normal. Neck: Supple, obese, difficult to gauge JVP.  No bruits or masses.   Cardiac: RRR, 1/6 systolic murmur at the left upper sternal border, no rubs or gallops. No clubbing, cyanosis, edema.  Radials 2+/PT 2+ and equal bilaterally.  Respiratory:  Respirations regular and  unlabored, clear to auscultation bilaterally. GI: Soft, nontender, nondistended, BS + x 4. MS: no deformity or atrophy. Skin: warm and dry, no rash. Neuro:  Strength and sensation are intact. Psych: Normal affect.  Accessory Clinical Findings    ECG personally reviewed by me today -sinus rhythm with short PR, 71- no acute changes.  Lab Results  Component Value Date   WBC 13.2 (H) 11/08/2022   HGB 12.5 11/08/2022   HCT 38.5 11/08/2022   MCV 85.6 11/08/2022   PLT 314 11/08/2022   Lab Results  Component Value Date   CREATININE 0.95 11/20/2022   BUN 42 (H) 11/20/2022   NA 139 11/20/2022   K 4.1 11/20/2022   CL 100 11/20/2022   CO2 29 11/20/2022   Lab Results  Component Value Date   ALT 14 06/04/2009   AST 14 06/04/2009   ALKPHOS 57 06/04/2009   BILITOT 0.2 (L) 06/04/2009   Assessment & Plan    1.  Precordial chest pain: Patient with 3 recent ED visits in the setting of chest pain.  On 1 episode, symptoms were very sharp, while on another, symptoms were associated with palpitations and documented atrial fibrillation.  She believes symptoms may be related to anxiety as on each occasion, troponins were normal.  She does have ongoing risk factors for coronary artery disease and had nonobstructive disease on catheterization in 2019.  We agreed to pursue a coronary CT angiogram to reevaluate coronary anatomy.  2.  Paroxysmal atrial fibrillation: Recent paroxysms associated with tachycardia landing her in the emergency department.  Flecainide was increased to 100 mg twice daily at that time and she is maintaining sinus rhythm with short PR interval.  She is anticoagulated with Eliquis.  She did not previously tolerate beta-blocker therapy secondary to profound fatigue and as result, she is on verapamil.  3.  Chronic HFpEF: On 2 of the 3 recent ED evaluations, she had mild edema in 1 instance, she was treated with IV Lasix and on the other, she was advised to take an additional half of  Lasix daily.  She is currently taking 40 mg once a day.  She was hypokalemic during her ER visit and I did follow-up labs today showing normal potassium and creatinine with a rise in BUN.  Adequate hydration advised.  Today, she appears euvolemic, though body habitus makes exam challenging.  Her weight is down 18  pounds since November 19 on our scale and she has not had any recurrent dyspnea or significant edema.  Heart rate and blood pressure currently well controlled.  4.  Essential hypertension: Stable on verapamil and lisinopril.  5.  Type 2 diabetes mellitus: On metformin therapy.  This is followed by primary care.  In the setting of HFpEF, would likely benefit from addition of GLP-1 agonist if not otherwise contraindicated or cost prohibitive.  Defer to primary care.  6.  Anxiety: Placed on clonazepam today.  She feels that this is the root of her recent symptoms.  7.  Hypokalemia: Potassium was recently 3.2 in the ED.  4.1 today.  8.  Disposition: Follow-up coronary CT angiogram.  Follow-up in clinic in 1 month or sooner if necessary.  Murray Hodgkins, NP 11/20/2022, 5:51 PM

## 2022-11-25 NOTE — Addendum Note (Signed)
Addended by: James Ivanoff D on: 11/25/2022 04:50 PM   Modules accepted: Orders

## 2022-11-26 ENCOUNTER — Other Ambulatory Visit: Payer: Self-pay | Admitting: Nurse Practitioner

## 2022-11-26 MED ORDER — FLECAINIDE ACETATE 100 MG PO TABS: 100.0000 mg | ORAL_TABLET | Freq: Two times a day (BID) | ORAL | 3 refills | Status: DC

## 2022-11-29 ENCOUNTER — Emergency Department
Admission: EM | Admit: 2022-11-29 | Discharge: 2022-11-29 | Disposition: A | Discharge status: Home / Self Care | Payer: 59 | Attending: Emergency Medicine | Admitting: Emergency Medicine

## 2022-11-29 ENCOUNTER — Emergency Department: Payer: 59

## 2022-11-29 ENCOUNTER — Encounter: Payer: Self-pay | Admitting: Pharmacy Technician

## 2022-11-29 ENCOUNTER — Other Ambulatory Visit: Payer: Self-pay

## 2022-11-29 DIAGNOSIS — I110 Hypertensive heart disease with heart failure: Secondary | ICD-10-CM | POA: Insufficient documentation

## 2022-11-29 DIAGNOSIS — E119 Type 2 diabetes mellitus without complications: Secondary | ICD-10-CM | POA: Insufficient documentation

## 2022-11-29 DIAGNOSIS — Z7901 Long term (current) use of anticoagulants: Secondary | ICD-10-CM | POA: Insufficient documentation

## 2022-11-29 DIAGNOSIS — R531 Weakness: Secondary | ICD-10-CM | POA: Diagnosis not present

## 2022-11-29 DIAGNOSIS — D72829 Elevated white blood cell count, unspecified: Secondary | ICD-10-CM | POA: Diagnosis not present

## 2022-11-29 DIAGNOSIS — I6782 Cerebral ischemia: Secondary | ICD-10-CM | POA: Diagnosis not present

## 2022-11-29 DIAGNOSIS — I509 Heart failure, unspecified: Secondary | ICD-10-CM | POA: Insufficient documentation

## 2022-11-29 DIAGNOSIS — R42 Dizziness and giddiness: Secondary | ICD-10-CM | POA: Diagnosis not present

## 2022-11-29 DIAGNOSIS — R944 Abnormal results of kidney function studies: Secondary | ICD-10-CM | POA: Diagnosis not present

## 2022-11-29 DIAGNOSIS — I4891 Unspecified atrial fibrillation: Secondary | ICD-10-CM | POA: Insufficient documentation

## 2022-11-29 DIAGNOSIS — R55 Syncope and collapse: Secondary | ICD-10-CM | POA: Diagnosis not present

## 2022-11-29 DIAGNOSIS — R2681 Unsteadiness on feet: Secondary | ICD-10-CM | POA: Diagnosis not present

## 2022-11-29 DIAGNOSIS — Z743 Need for continuous supervision: Secondary | ICD-10-CM | POA: Diagnosis not present

## 2022-11-29 DIAGNOSIS — R609 Edema, unspecified: Secondary | ICD-10-CM | POA: Diagnosis not present

## 2022-11-29 DIAGNOSIS — Z1152 Encounter for screening for COVID-19: Secondary | ICD-10-CM | POA: Diagnosis not present

## 2022-11-29 LAB — COMPREHENSIVE METABOLIC PANEL
ALT: 38 U/L (ref 0–44)
AST: 48 U/L — ABNORMAL HIGH (ref 15–41)
Albumin: 3.7 g/dL (ref 3.5–5.0)
Alkaline Phosphatase: 59 U/L (ref 38–126)
Anion gap: 14 (ref 5–15)
BUN: 38 mg/dL — ABNORMAL HIGH (ref 8–23)
CO2: 25 mmol/L (ref 22–32)
Calcium: 9.3 mg/dL (ref 8.9–10.3)
Chloride: 96 mmol/L — ABNORMAL LOW (ref 98–111)
Creatinine, Ser: 1.65 mg/dL — ABNORMAL HIGH (ref 0.44–1.00)
GFR, Estimated: 35 mL/min — ABNORMAL LOW (ref >60)
Glucose, Bld: 96 mg/dL (ref 70–99)
Potassium: 3.9 mmol/L (ref 3.5–5.1)
Sodium: 135 mmol/L (ref 135–145)
Total Bilirubin: 0.5 mg/dL (ref 0.3–1.2)
Total Protein: 7.7 g/dL (ref 6.5–8.1)

## 2022-11-29 LAB — CBC WITH DIFFERENTIAL/PLATELET
Abs Immature Granulocytes: 0.24 10*3/uL — ABNORMAL HIGH (ref 0.00–0.07)
Basophils Absolute: 0.1 10*3/uL (ref 0.0–0.1)
Basophils Relative: 1 %
Eosinophils Absolute: 0.1 10*3/uL (ref 0.0–0.5)
Eosinophils Relative: 1 %
HCT: 36.5 % (ref 36.0–46.0)
Hemoglobin: 11.5 g/dL — ABNORMAL LOW (ref 12.0–15.0)
Immature Granulocytes: 1 %
Lymphocytes Relative: 16 %
Lymphs Abs: 2.8 10*3/uL (ref 0.7–4.0)
MCH: 27.7 pg (ref 26.0–34.0)
MCHC: 31.5 g/dL (ref 30.0–36.0)
MCV: 88 fL (ref 80.0–100.0)
Monocytes Absolute: 1.2 10*3/uL — ABNORMAL HIGH (ref 0.1–1.0)
Monocytes Relative: 7 %
Neutro Abs: 12.9 10*3/uL — ABNORMAL HIGH (ref 1.7–7.7)
Neutrophils Relative %: 74 %
Platelets: 365 10*3/uL (ref 150–400)
RBC: 4.15 MIL/uL (ref 3.87–5.11)
RDW: 14.7 % (ref 11.5–15.5)
WBC: 17.3 10*3/uL — ABNORMAL HIGH (ref 4.0–10.5)
nRBC: 0 % (ref 0.0–0.2)

## 2022-11-29 LAB — TROPONIN I (HIGH SENSITIVITY)
Troponin I (High Sensitivity): 3 ng/L (ref <18)
Troponin I (High Sensitivity): 4 ng/L (ref <18)

## 2022-11-29 LAB — URINALYSIS, ROUTINE W REFLEX MICROSCOPIC
Bilirubin Urine: NEGATIVE
Glucose, UA: NEGATIVE mg/dL
Hgb urine dipstick: NEGATIVE
Ketones, ur: NEGATIVE mg/dL
Leukocytes,Ua: NEGATIVE
Nitrite: NEGATIVE
Protein, ur: NEGATIVE mg/dL
Specific Gravity, Urine: 1.008 (ref 1.005–1.030)
pH: 5 (ref 5.0–8.0)

## 2022-11-29 LAB — RESP PANEL BY RT-PCR (RSV, FLU A&B, COVID)  RVPGX2
Influenza A by PCR: NEGATIVE
Influenza B by PCR: NEGATIVE
Resp Syncytial Virus by PCR: NEGATIVE
SARS Coronavirus 2 by RT PCR: NEGATIVE

## 2022-11-29 LAB — BRAIN NATRIURETIC PEPTIDE: B Natriuretic Peptide: 35.1 pg/mL (ref 0.0–100.0)

## 2022-11-29 MED ORDER — SODIUM CHLORIDE 0.9 % IV BOLUS
500.0000 mL | Freq: Once | INTRAVENOUS | Status: AC
  Administered 2022-11-29: 500 mL via INTRAVENOUS

## 2022-11-29 NOTE — ED Triage Notes (Signed)
Pt to er, pt states that everything was normal and then her vision got "splotchy" states that she took her blood pressure and it was low.  Per EMS pt is here for dizziness and near syncope

## 2022-11-29 NOTE — Discharge Instructions (Signed)
Take all of your normal medications as prescribed.  Make sure to maintain good fluid intake and stay hydrated.  Follow-up with your primary care doctor, the nutritionist tomorrow as discussed, and with your cardiologist.  Return to the ER for new, worsening, or persistent severe weakness or lightheadedness, severe headache, vomiting, chest pain, difficulty breathing, abnormal blood pressure readings especially low readings below 035 systolic or high readings above 597 systolic or 416 diastolic, or any other new or worsening symptoms that concern you.

## 2022-11-29 NOTE — ED Provider Notes (Signed)
[facility] Provider Note    Event Date/Time   First MD Initiated Contact with Patient 11/29/22 1757     (approximate)   History   Near Syncope   HPI  [name] is a 61 y.o. female with history of atrial fibrillation on Eliquis, hypertension, CHF, diabetes, and obesity who presents with near syncope this afternoon.  The patient states that she was sitting at her desk when she started to feel that her vision got "splotchy" and then felt very weak and lightheaded.  The patient states that she felt like she was about to pass out.  She took her blood pressure and it was around 315 systolic.  She denies associated chest pain, difficulty breathing, headache, nausea or vomiting, or other acute symptoms.  She states that she was feeling well earlier today and yesterday.  She states she is feeling somewhat better now but still weak.  I reviewed the past medical records.  The patient was most recently seen in the ED on 11/19 for elevated blood pressure and chest discomfort.  She subsequently followed up with cardiology on 12/1 and was ordered for a coronary CT.  She is on verapamil for her atrial fibrillation.   Physical Exam   Triage Vital Signs: ED Triage Vitals  Enc Vitals Group     BP 11/29/22 1801 (!) 122/54     Pulse Rate 11/29/22 1801 71     Resp 11/29/22 1801 18     Temp 11/29/22 1801 98.1 F (36.7 C)     Temp Source 11/29/22 1801 Oral     SpO2 11/29/22 1801 96 %     Weight --      Height --      Head Circumference --      Peak Flow --      Pain Score 11/29/22 1802 0     Pain Loc --      Pain Edu? --      Excl. in [place]? --     Most recent vital signs: Vitals:   11/29/22 2130 11/29/22 2200  BP: (!) 122/49 (!) 116/49  Pulse: 71 69  Resp: 16 17  Temp:    SpO2: 100% 100%     General: Alert and oriented, weak appearing but in no acute distress. CV:  Good peripheral perfusion.  Normal heart sounds. Resp:  Normal effort.  Lungs CTAB. Abd:  No  distention.  Soft and nontender. Other:  EOMI.  PERRLA.  Motor intact in all extremities.  Normal coordination.  No ataxia.  Slightly dry mucous membranes.   ED Results / Procedures / Treatments   Labs (all labs ordered are listed, but only abnormal results are displayed) Labs Reviewed  COMPREHENSIVE METABOLIC PANEL - Abnormal; Notable for the following components:      Result Value   Chloride 96 (*)    BUN 38 (*)    Creatinine, Ser 1.65 (*)    AST 48 (*)    GFR, Estimated 35 (*)    All other components within normal limits  CBC WITH DIFFERENTIAL/PLATELET - Abnormal; Notable for the following components:   WBC 17.3 (*)    Hemoglobin 11.5 (*)    Neutro Abs 12.9 (*)    Monocytes Absolute 1.2 (*)    Abs Immature Granulocytes 0.24 (*)    All other components within normal limits  URINALYSIS, ROUTINE W REFLEX MICROSCOPIC - Abnormal; Notable for the following components:   Color, Urine YELLOW (*)  APPearance HAZY (*)    All other components within normal limits  RESP PANEL BY RT-PCR (RSV, FLU A&B, COVID)  RVPGX2  BRAIN NATRIURETIC PEPTIDE  TROPONIN I (HIGH SENSITIVITY)  TROPONIN I (HIGH SENSITIVITY)     EKG  ED ECG REPORT I, Arta Silence, the attending physician, personally viewed and interpreted this ECG.  Date: 11/29/2022 EKG Time: 1804 Rate: 71 Rhythm: normal sinus rhythm QRS Axis: normal Intervals: normal ST/T Wave abnormalities: normal Narrative Interpretation: no evidence of acute ischemia    RADIOLOGY  CT head: I independently viewed and interpreted the images; there is no ICH.  Radiology report indicates no acute abnormality.   PROCEDURES:  Critical Care performed: No  Procedures   MEDICATIONS ORDERED IN ED: Medications  sodium chloride 0.9 % bolus 500 mL (0 mLs Intravenous Stopped 11/29/22 2213)     IMPRESSION / MDM / ASSESSMENT AND PLAN / ED COURSE  I reviewed the triage vital signs and the nursing notes.  61 year old female with PMH  as noted above presents with generalized weakness and near syncope this afternoon now somewhat improved.  It was associated with borderline low BP which has now also resolved.  The patient denies other focal symptoms except for transient splotches in her vision which are now resolved.  Physical exam is unremarkable; the vital signs are normal.  Neurologic exam is also normal.  Differential diagnosis includes, but is not limited to, vasovagal episode, dehydration/hypovolemia, electrolyte abnormality, other metabolic disturbance, side effects of antihypertensives, other medication side effect, UTI, viral syndrome, or other infection, or less likely cardiac cause.  I have a low suspicion for CNS etiology but given the transient vision changes we will obtain a CT for further evaluation as well as labs.  Patient's presentation is most consistent with acute presentation with potential threat to life or bodily function.  The patient is on the cardiac monitor to evaluate for evidence of arrhythmia and/or significant heart rate changes.   ----------------------------------------- 9:54 PM on 11/29/2022 -----------------------------------------  On reassessment, the patient is much more comfortable and energetic appearing.  Her vital signs have remained stable throughout her ED stay.  She states she is feeling significantly better.  Lab workup is overall unremarkable.  Troponins are negative x 2.  Respiratory panel is negative.  Urinalysis shows no evidence of UTI.  CMP shows a slightly elevated creatinine from baseline consistent with mild AKI but her electrolytes are normal.  CBC shows leukocytosis of 17 which is nonspecific.  The patient appears to have somewhat chronic leukocytosis.  She has no focal signs or symptoms to suggest infection.  CT is negative for acute findings.  I did consider whether the patient may benefit from admission for further monitoring given her near syncope.  I did offer admission  to the patient, however, she states she feels significantly better and would prefer to go home.  Given her normal neuro exam, stable vital signs, and negative workup I think that this is reasonable.  I advised the patient to continue her normal medications, stay hydrated, and follow-up with her regular doctor.  I gave her strict return precautions and she expressed understanding.   FINAL CLINICAL IMPRESSION(S) / ED DIAGNOSES   Final diagnoses:  Near syncope     Rx / DC Orders   ED Discharge Orders     None        Note:  This document was prepared using Dragon voice recognition software and may include unintentional dictation errors.    Arta Silence,  MD 11/29/22 2340

## 2022-11-30 ENCOUNTER — Ambulatory Visit: Payer: 59 | Admitting: Dietician

## 2022-12-04 ENCOUNTER — Telehealth (HOSPITAL_COMMUNITY): Payer: Self-pay | Admitting: Emergency Medicine

## 2022-12-04 NOTE — Telephone Encounter (Signed)
Reaching out to patient to offer assistance regarding upcoming cardiac imaging study; pt verbalizes understanding of appt date/time, parking situation and where to check in, pre-test NPO status and medications ordered, and verified current allergies; name and call back number provided for further questions should they arise Marchia Bond RN Navigator Cardiac Imaging [facility] [phone] office [phone] cell   Arrival 115 '100mg'$  metoprolol  R arm restriction

## 2022-12-06 ENCOUNTER — Observation Stay (HOSPITAL_COMMUNITY)
Admission: EM | Admit: 2022-12-06 | Discharge: 2022-12-08 | Disposition: A | Discharge status: Home / Self Care | Payer: 59 | Attending: Internal Medicine | Admitting: Internal Medicine

## 2022-12-06 ENCOUNTER — Other Ambulatory Visit: Payer: Self-pay

## 2022-12-06 ENCOUNTER — Encounter (HOSPITAL_COMMUNITY): Payer: Self-pay

## 2022-12-06 ENCOUNTER — Emergency Department (HOSPITAL_COMMUNITY): Payer: 59

## 2022-12-06 DIAGNOSIS — I10 Essential (primary) hypertension: Secondary | ICD-10-CM | POA: Diagnosis present

## 2022-12-06 DIAGNOSIS — E039 Hypothyroidism, unspecified: Secondary | ICD-10-CM | POA: Diagnosis not present

## 2022-12-06 DIAGNOSIS — K8020 Calculus of gallbladder without cholecystitis without obstruction: Secondary | ICD-10-CM | POA: Diagnosis not present

## 2022-12-06 DIAGNOSIS — Z853 Personal history of malignant neoplasm of breast: Secondary | ICD-10-CM | POA: Insufficient documentation

## 2022-12-06 DIAGNOSIS — S0990XA Unspecified injury of head, initial encounter: Secondary | ICD-10-CM | POA: Diagnosis not present

## 2022-12-06 DIAGNOSIS — K529 Noninfective gastroenteritis and colitis, unspecified: Secondary | ICD-10-CM | POA: Diagnosis not present

## 2022-12-06 DIAGNOSIS — G4733 Obstructive sleep apnea (adult) (pediatric): Secondary | ICD-10-CM | POA: Diagnosis present

## 2022-12-06 DIAGNOSIS — I2510 Atherosclerotic heart disease of native coronary artery without angina pectoris: Secondary | ICD-10-CM | POA: Diagnosis not present

## 2022-12-06 DIAGNOSIS — R519 Headache, unspecified: Secondary | ICD-10-CM | POA: Diagnosis not present

## 2022-12-06 DIAGNOSIS — Z6841 Body Mass Index (BMI) 40.0 and over, adult: Secondary | ICD-10-CM | POA: Insufficient documentation

## 2022-12-06 DIAGNOSIS — Z743 Need for continuous supervision: Secondary | ICD-10-CM | POA: Diagnosis not present

## 2022-12-06 DIAGNOSIS — R4182 Altered mental status, unspecified: Secondary | ICD-10-CM | POA: Diagnosis not present

## 2022-12-06 DIAGNOSIS — R112 Nausea with vomiting, unspecified: Secondary | ICD-10-CM

## 2022-12-06 DIAGNOSIS — I110 Hypertensive heart disease with heart failure: Secondary | ICD-10-CM | POA: Insufficient documentation

## 2022-12-06 DIAGNOSIS — Z87891 Personal history of nicotine dependence: Secondary | ICD-10-CM | POA: Diagnosis not present

## 2022-12-06 DIAGNOSIS — Z7901 Long term (current) use of anticoagulants: Secondary | ICD-10-CM | POA: Insufficient documentation

## 2022-12-06 DIAGNOSIS — R197 Diarrhea, unspecified: Secondary | ICD-10-CM | POA: Diagnosis not present

## 2022-12-06 DIAGNOSIS — Z1152 Encounter for screening for COVID-19: Secondary | ICD-10-CM | POA: Insufficient documentation

## 2022-12-06 DIAGNOSIS — E119 Type 2 diabetes mellitus without complications: Secondary | ICD-10-CM

## 2022-12-06 DIAGNOSIS — R55 Syncope and collapse: Secondary | ICD-10-CM | POA: Diagnosis not present

## 2022-12-06 DIAGNOSIS — Z66 Do not resuscitate: Secondary | ICD-10-CM

## 2022-12-06 DIAGNOSIS — Z7984 Long term (current) use of oral hypoglycemic drugs: Secondary | ICD-10-CM | POA: Diagnosis not present

## 2022-12-06 DIAGNOSIS — I499 Cardiac arrhythmia, unspecified: Secondary | ICD-10-CM | POA: Diagnosis not present

## 2022-12-06 DIAGNOSIS — J45909 Unspecified asthma, uncomplicated: Secondary | ICD-10-CM | POA: Insufficient documentation

## 2022-12-06 DIAGNOSIS — Z79899 Other long term (current) drug therapy: Secondary | ICD-10-CM | POA: Diagnosis not present

## 2022-12-06 DIAGNOSIS — G4730 Sleep apnea, unspecified: Secondary | ICD-10-CM | POA: Diagnosis present

## 2022-12-06 DIAGNOSIS — I5032 Chronic diastolic (congestive) heart failure: Secondary | ICD-10-CM | POA: Diagnosis not present

## 2022-12-06 DIAGNOSIS — I959 Hypotension, unspecified: Secondary | ICD-10-CM | POA: Insufficient documentation

## 2022-12-06 DIAGNOSIS — I480 Paroxysmal atrial fibrillation: Secondary | ICD-10-CM | POA: Diagnosis not present

## 2022-12-06 DIAGNOSIS — F32A Depression, unspecified: Secondary | ICD-10-CM | POA: Diagnosis present

## 2022-12-06 HISTORY — DX: Do not resuscitate: Z66

## 2022-12-06 LAB — COMPREHENSIVE METABOLIC PANEL
ALT: 29 U/L (ref 0–44)
AST: 36 U/L (ref 15–41)
Albumin: 3.1 g/dL — ABNORMAL LOW (ref 3.5–5.0)
Alkaline Phosphatase: 57 U/L (ref 38–126)
Anion gap: 14 (ref 5–15)
BUN: 15 mg/dL (ref 8–23)
CO2: 23 mmol/L (ref 22–32)
Calcium: 8.8 mg/dL — ABNORMAL LOW (ref 8.9–10.3)
Chloride: 98 mmol/L (ref 98–111)
Creatinine, Ser: 0.94 mg/dL (ref 0.44–1.00)
GFR, Estimated: >60 mL/min (ref >60)
Glucose, Bld: 174 mg/dL — ABNORMAL HIGH (ref 70–99)
Potassium: 3.4 mmol/L — ABNORMAL LOW (ref 3.5–5.1)
Sodium: 135 mmol/L (ref 135–145)
Total Bilirubin: 0.6 mg/dL (ref 0.3–1.2)
Total Protein: 6.7 g/dL (ref 6.5–8.1)

## 2022-12-06 LAB — CBG MONITORING, ED
Glucose-Capillary: 127 mg/dL — ABNORMAL HIGH (ref 70–99)
Glucose-Capillary: 112 mg/dL — ABNORMAL HIGH (ref 70–99)
Glucose-Capillary: 103 mg/dL — ABNORMAL HIGH (ref 70–99)

## 2022-12-06 LAB — CBC WITH DIFFERENTIAL/PLATELET
Abs Immature Granulocytes: 0.17 10*3/uL — ABNORMAL HIGH (ref 0.00–0.07)
Basophils Absolute: 0.1 10*3/uL (ref 0.0–0.1)
Basophils Relative: 0 %
Eosinophils Absolute: 0.1 10*3/uL (ref 0.0–0.5)
Eosinophils Relative: 0 %
HCT: 35.5 % — ABNORMAL LOW (ref 36.0–46.0)
Hemoglobin: 11.7 g/dL — ABNORMAL LOW (ref 12.0–15.0)
Immature Granulocytes: 1 %
Lymphocytes Relative: 3 %
Lymphs Abs: 0.7 10*3/uL (ref 0.7–4.0)
MCH: 29.2 pg (ref 26.0–34.0)
MCHC: 33 g/dL (ref 30.0–36.0)
MCV: 88.5 fL (ref 80.0–100.0)
Monocytes Absolute: 0.9 10*3/uL (ref 0.1–1.0)
Monocytes Relative: 4 %
Neutro Abs: 19.7 10*3/uL — ABNORMAL HIGH (ref 1.7–7.7)
Neutrophils Relative %: 92 %
Platelets: 316 10*3/uL (ref 150–400)
RBC: 4.01 MIL/uL (ref 3.87–5.11)
RDW: 14.6 % (ref 11.5–15.5)
WBC: 21.5 10*3/uL — ABNORMAL HIGH (ref 4.0–10.5)
nRBC: 0 % (ref 0.0–0.2)

## 2022-12-06 LAB — URINALYSIS, ROUTINE W REFLEX MICROSCOPIC
Bilirubin Urine: NEGATIVE
Glucose, UA: NEGATIVE mg/dL
Hgb urine dipstick: NEGATIVE
Ketones, ur: NEGATIVE mg/dL
Leukocytes,Ua: NEGATIVE
Nitrite: NEGATIVE
Protein, ur: NEGATIVE mg/dL
Specific Gravity, Urine: >1.046 — ABNORMAL HIGH (ref 1.005–1.030)
pH: 5 (ref 5.0–8.0)

## 2022-12-06 LAB — RESP PANEL BY RT-PCR (RSV, FLU A&B, COVID)  RVPGX2
Influenza A by PCR: NEGATIVE
Influenza B by PCR: NEGATIVE
Resp Syncytial Virus by PCR: NEGATIVE
SARS Coronavirus 2 by RT PCR: NEGATIVE

## 2022-12-06 LAB — LIPASE, BLOOD: Lipase: 33 U/L (ref 11–51)

## 2022-12-06 LAB — C DIFFICILE QUICK SCREEN W PCR REFLEX
C Diff antigen: NEGATIVE
C Diff interpretation: NOT DETECTED
C Diff toxin: NEGATIVE

## 2022-12-06 LAB — LACTIC ACID, PLASMA: Lactic Acid, Venous: 3.2 mmol/L (ref 0.5–1.9)

## 2022-12-06 LAB — HIV ANTIBODY (ROUTINE TESTING W REFLEX): HIV Screen 4th Generation wRfx: NONREACTIVE

## 2022-12-06 MED ORDER — OLOPATADINE HCL 0.1 % OP SOLN
1.0000 [drp] | Freq: Every day | OPHTHALMIC | Status: DC
  Filled 2022-12-06: qty 5

## 2022-12-06 MED ORDER — ACETAMINOPHEN 650 MG RE SUPP: 650.0000 mg | Freq: Four times a day (QID) | RECTAL | Status: DC | PRN

## 2022-12-06 MED ORDER — ONDANSETRON HCL 4 MG PO TABS
4.0000 mg | ORAL_TABLET | Freq: Four times a day (QID) | ORAL | Status: DC | PRN
  Administered 2022-12-08: 4 mg via ORAL
  Filled 2022-12-06: qty 1

## 2022-12-06 MED ORDER — IOHEXOL 350 MG/ML SOLN
100.0000 mL | Freq: Once | INTRAVENOUS | Status: AC | PRN
  Administered 2022-12-06: 100 mL via INTRAVENOUS

## 2022-12-06 MED ORDER — CLONAZEPAM 0.5 MG PO TABS
0.5000 mg | ORAL_TABLET | Freq: Two times a day (BID) | ORAL | Status: DC
  Administered 2022-12-07 – 2022-12-08 (×3): 0.5 mg via ORAL
  Filled 2022-12-06 (×4): qty 1

## 2022-12-06 MED ORDER — MONTELUKAST SODIUM 10 MG PO TABS
10.0000 mg | ORAL_TABLET | Freq: Every day | ORAL | Status: DC
  Administered 2022-12-06 – 2022-12-07 (×2): 10 mg via ORAL
  Filled 2022-12-06 (×2): qty 1

## 2022-12-06 MED ORDER — PANTOPRAZOLE SODIUM 40 MG PO TBEC
40.0000 mg | DELAYED_RELEASE_TABLET | Freq: Every day | ORAL | Status: DC
  Administered 2022-12-06 – 2022-12-08 (×3): 40 mg via ORAL
  Filled 2022-12-06 (×3): qty 1

## 2022-12-06 MED ORDER — ACETAMINOPHEN 325 MG PO TABS
650.0000 mg | ORAL_TABLET | Freq: Four times a day (QID) | ORAL | Status: DC | PRN
  Administered 2022-12-08: 650 mg via ORAL
  Filled 2022-12-06: qty 2

## 2022-12-06 MED ORDER — ONDANSETRON HCL 4 MG/2ML IJ SOLN: 4.0000 mg | Freq: Four times a day (QID) | INTRAMUSCULAR | Status: DC | PRN

## 2022-12-06 MED ORDER — LAMOTRIGINE 100 MG PO TABS
150.0000 mg | ORAL_TABLET | Freq: Two times a day (BID) | ORAL | Status: DC
  Administered 2022-12-06 – 2022-12-08 (×4): 150 mg via ORAL
  Filled 2022-12-06: qty 3
  Filled 2022-12-06 (×2): qty 6
  Filled 2022-12-06: qty 2
  Filled 2022-12-06: qty 6
  Filled 2022-12-06: qty 2

## 2022-12-06 MED ORDER — ALBUTEROL SULFATE (2.5 MG/3ML) 0.083% IN NEBU: 3.0000 mL | INHALATION_SOLUTION | Freq: Four times a day (QID) | RESPIRATORY_TRACT | Status: DC | PRN

## 2022-12-06 MED ORDER — HYDRALAZINE HCL 20 MG/ML IJ SOLN: 5.0000 mg | INTRAMUSCULAR | Status: DC | PRN

## 2022-12-06 MED ORDER — FLECAINIDE ACETATE 100 MG PO TABS
100.0000 mg | ORAL_TABLET | Freq: Two times a day (BID) | ORAL | Status: DC
  Administered 2022-12-06 – 2022-12-08 (×4): 100 mg via ORAL
  Filled 2022-12-06 (×2): qty 2
  Filled 2022-12-06 (×3): qty 1

## 2022-12-06 MED ORDER — SODIUM CHLORIDE 0.9 % IV BOLUS
500.0000 mL | Freq: Once | INTRAVENOUS | Status: AC
  Administered 2022-12-06: 500 mL via INTRAVENOUS

## 2022-12-06 MED ORDER — INSULIN ASPART 100 UNIT/ML IJ SOLN
0.0000 [IU] | Freq: Three times a day (TID) | INTRAMUSCULAR | Status: DC
  Administered 2022-12-06: 3 [IU] via SUBCUTANEOUS

## 2022-12-06 MED ORDER — LACTATED RINGERS IV BOLUS
1000.0000 mL | Freq: Once | INTRAVENOUS | Status: AC
  Administered 2022-12-06: 1000 mL via INTRAVENOUS

## 2022-12-06 MED ORDER — INSULIN ASPART 100 UNIT/ML IJ SOLN: 0.0000 [IU] | Freq: Every day | INTRAMUSCULAR | Status: DC

## 2022-12-06 MED ORDER — APIXABAN 5 MG PO TABS
5.0000 mg | ORAL_TABLET | Freq: Two times a day (BID) | ORAL | Status: DC
  Administered 2022-12-06 – 2022-12-08 (×5): 5 mg via ORAL
  Filled 2022-12-06 (×5): qty 1

## 2022-12-06 MED ORDER — SODIUM CHLORIDE 0.9% FLUSH
3.0000 mL | Freq: Two times a day (BID) | INTRAVENOUS | Status: DC
  Administered 2022-12-06 – 2022-12-08 (×2): 3 mL via INTRAVENOUS

## 2022-12-06 MED ORDER — LORATADINE 10 MG PO TABS
10.0000 mg | ORAL_TABLET | Freq: Every day | ORAL | Status: DC
  Administered 2022-12-07 – 2022-12-08 (×2): 10 mg via ORAL
  Filled 2022-12-06 (×2): qty 1

## 2022-12-06 MED ORDER — LEVOTHYROXINE SODIUM 50 MCG PO TABS
50.0000 ug | ORAL_TABLET | Freq: Every day | ORAL | Status: DC
  Administered 2022-12-07 – 2022-12-08 (×2): 50 ug via ORAL
  Filled 2022-12-06 (×2): qty 1

## 2022-12-06 MED ORDER — LACTATED RINGERS IV SOLN: INTRAVENOUS | Status: DC

## 2022-12-06 NOTE — ED Notes (Signed)
Patient given water with ice chips. Pt informed RN that she drank 2 cups of water on day shift without nausea and reports no symptoms of nausea after drinking water.

## 2022-12-06 NOTE — ED Triage Notes (Signed)
Pt bib [facility] from home. Called because had syncopal episode while vomiting. Vomited 6 times to day. Syncopal episode was unwitnessed. On blood thinners.   102/53 bp, 250 LR and '4mg'$  zofran, 20 g  in lac, hr 80s  with ems.

## 2022-12-06 NOTE — ED Notes (Addendum)
Error in charting.

## 2022-12-06 NOTE — ED Provider Notes (Signed)
[facility] EMERGENCY DEPARTMENT Provider Note   CSN: [id number] Arrival date & time: 12/06/22  0110     History  Chief Complaint  Patient presents with   Emesis   Loss of Consciousness    [name] is a 61 y.o. female.  Patient brought to the emergency department by ambulance for syncopal episode.  Patient reports that she has had multiple episodes of vomiting.  Just before EMS was called she had another episode of emesis and then passed out.  Patient reports that she has had diarrhea as well.  She is experiencing some pain on her right side of her abdomen.       Home Medications Prior to Admission medications   Medication Sig Start Date End Date Taking? Authorizing Provider  albuterol (PROVENTIL HFA;VENTOLIN HFA) 108 (90 Base) MCG/ACT inhaler Inhale 2 puffs into the lungs every 6 (six) hours as needed for wheezing or shortness of breath. 04/10/17   Barry Dienes, NP  ALPRAZolam Duanne Moron) 0.5 MG tablet Take 0.25-0.5 mg by mouth daily as needed. Patient not taking: Reported on 11/20/2022 08/20/22   [provider]  apixaban (ELIQUIS) 5 MG TABS tablet Take 1 tablet (5 mg total) by mouth every 12 (twelve) hours. 10/01/22   Theora Gianotti, NP  Cholecalciferol (VITAMIN D3) 75 MCG (3000 UT) TABS Take 3,000 mg by mouth daily.    [provider]  clonazePAM (KLONOPIN) 0.5 MG tablet Take 0.5 mg by mouth daily. 11/19/22   [provider]  fexofenadine (ALLEGRA) 180 MG tablet Take 180 mg by mouth daily.    [provider]  flecainide (TAMBOCOR) 100 MG tablet Take 1 tablet (100 mg total) by mouth 2 (two) times daily. 11/26/22   Theora Gianotti, NP  fluticasone (FLONASE) 50 MCG/ACT nasal spray Place 1 spray into both nostrils daily as needed for allergies.    [provider]  furosemide (LASIX) 40 MG tablet Take 40 mg by mouth daily.    [provider]  lamoTRIgine (LAMICTAL) 100 MG tablet Take 25 mg by  mouth every 12 (twelve) hours.    [provider]  levothyroxine (SYNTHROID) 50 MCG tablet Take 50 mcg by mouth daily before breakfast.    [provider]  lisinopril (PRINIVIL,ZESTRIL) 40 MG tablet Take 40 mg by mouth daily.    [provider]  metFORMIN (GLUCOPHAGE) 1000 MG tablet Take 1,000 mg by mouth every 12 (twelve) hours. 07/04/20   [provider]  metoprolol tartrate (LOPRESSOR) 100 MG tablet Take 1 tablet (100 mg total) by mouth once for 1 dose. Take 2 hours prior to cardiac CT 11/20/22 11/20/22  Theora Gianotti, NP  montelukast (SINGULAIR) 10 MG tablet Take 10 mg by mouth at bedtime.    [provider]  omeprazole (PRILOSEC) 20 MG capsule Take 20 mg by mouth every 12 (twelve) hours.    [provider]  Prenatal Vit-Fe Fumarate-FA (PRENATAL MULTIVITAMIN) TABS tablet Take 1 tablet by mouth daily. With iron    [provider]  verapamil (CALAN-SR) 180 MG CR tablet Take 2 tablets (360 mg total) by mouth daily. 09/14/22   Theora Gianotti, NP      Allergies    Erythromycin    Review of Systems   Review of Systems  Physical Exam Updated Vital Signs BP 115/64   Pulse 95   Temp 97.6 F (36.4 C) (Oral)   Resp 16   SpO2 96%  Physical Exam Vitals and nursing note  reviewed.  Constitutional:      General: She is in acute distress.     Appearance: She is well-developed. She is diaphoretic.  HENT:     Head: Normocephalic and atraumatic.     Mouth/Throat:     Mouth: Mucous membranes are moist.  Eyes:     General: Vision grossly intact. Gaze aligned appropriately.     Extraocular Movements: Extraocular movements intact.     Conjunctiva/sclera: Conjunctivae normal.  Cardiovascular:     Rate and Rhythm: Regular rhythm. Tachycardia present.     Pulses: Normal pulses.     Heart sounds: Normal heart sounds, S1 normal and S2 normal. No murmur heard.    No friction rub. No gallop.  Pulmonary:     Effort:  Pulmonary effort is normal. No respiratory distress.     Breath sounds: Normal breath sounds.  Abdominal:     General: Bowel sounds are normal.     Palpations: Abdomen is soft.     Tenderness: There is generalized abdominal tenderness. There is no guarding or rebound.     Hernia: No hernia is present.  Musculoskeletal:        General: No swelling.     Cervical back: Full passive range of motion without pain, normal range of motion and neck supple. No spinous process tenderness or muscular tenderness. Normal range of motion.     Right lower leg: No edema.     Left lower leg: No edema.  Skin:    General: Skin is warm.     Capillary Refill: Capillary refill takes less than 2 seconds.     Findings: No ecchymosis, erythema, rash or wound.  Neurological:     General: No focal deficit present.     Mental Status: She is alert and oriented to person, place, and time.     GCS: GCS eye subscore is 4. GCS verbal subscore is 5. GCS motor subscore is 6.     Cranial Nerves: Cranial nerves 2-12 are intact.     Sensory: Sensation is intact.     Motor: Motor function is intact.     Coordination: Coordination is intact.  Psychiatric:        Attention and Perception: Attention normal.        Mood and Affect: Mood normal.        Speech: Speech normal.        Behavior: Behavior normal.     ED Results / Procedures / Treatments   Labs (all labs ordered are listed, but only abnormal results are displayed) Labs Reviewed  CBC WITH DIFFERENTIAL/PLATELET - Abnormal; Notable for the following components:      Result Value   WBC 21.5 (*)    Hemoglobin 11.7 (*)    HCT 35.5 (*)    Neutro Abs 19.7 (*)    Abs Immature Granulocytes 0.17 (*)    All other components within normal limits  COMPREHENSIVE METABOLIC PANEL - Abnormal; Notable for the following components:   Potassium 3.4 (*)    Glucose, Bld 174 (*)    Calcium 8.8 (*)    Albumin 3.1 (*)    All other components within normal limits  LACTIC ACID,  PLASMA - Abnormal; Notable for the following components:   Lactic Acid, Venous 3.2 (*)    All other components within normal limits  URINALYSIS, ROUTINE W REFLEX MICROSCOPIC - Abnormal; Notable for the following components:   APPearance HAZY (*)    Specific Gravity, Urine >1.046 (*)  All other components within normal limits  RESP PANEL BY RT-PCR (RSV, FLU A&B, COVID)  RVPGX2  LIPASE, BLOOD    EKG EKG Interpretation  Date/Time:  Sunday December 06 2022 01:19:53 EST Ventricular Rate:  82 PR Interval:  146 QRS Duration: 101 QT Interval:  409 QTC Calculation: 478 R Axis:   66 Text Interpretation: Sinus rhythm Abnormal R-wave progression, early transition Confirmed by Orpah Greek [phone]) on 12/06/2022 4:53:59 AM  Radiology CT ABDOMEN PELVIS W CONTRAST  Result Date: 12/06/2022 CLINICAL DATA:  61 year old female with history of acute onset of nonlocalized abdominal pain. EXAM: CT ABDOMEN AND PELVIS WITH CONTRAST TECHNIQUE: Multidetector CT imaging of the abdomen and pelvis was performed using the standard protocol following bolus administration of intravenous contrast. RADIATION DOSE REDUCTION: This exam was performed according to the departmental dose-optimization program which includes automated exposure control, adjustment of the mA and/or kV according to patient size and/or use of iterative reconstruction technique. CONTRAST:  189m OMNIPAQUE IOHEXOL 350 MG/ML SOLN COMPARISON:  CT of the abdomen and pelvis 07/31/2020. FINDINGS: Lower chest: Unremarkable. Hepatobiliary: No suspicious cystic or solid hepatic lesions. No intra or extrahepatic biliary ductal dilatation. Numerous calcified gallstones lying dependently in the gallbladder. Gallbladder is moderately distended. Gallbladder wall thickness appears normal. No pericholecystic fluid or surrounding inflammatory changes. Pancreas: No pancreatic mass. No pancreatic ductal dilatation. No pancreatic or peripancreatic fluid  collections or inflammatory changes. Spleen: Unremarkable. Adrenals/Urinary Tract: Bilateral kidneys and bilateral adrenal glands are normal in appearance. No hydroureteronephrosis. Urinary bladder is nearly decompressed, but otherwise unremarkable in appearance. Stomach/Bowel: The appearance of the stomach is normal. There is no pathologic dilatation of small bowel or colon. The appendix is not confidently identified and may be surgically absent. Regardless, there are no inflammatory changes noted adjacent to the cecum to suggest the presence of an acute appendicitis at this time. Vascular/Lymphatic: Minimal atherosclerotic calcifications are noted in the abdominal aorta. No lymphadenopathy noted in the abdomen or pelvis. Reproductive: Uterus and ovaries are unremarkable in appearance. Other: No significant volume of ascites.  No pneumoperitoneum. Musculoskeletal: There are no aggressive appearing lytic or blastic lesions noted in the visualized portions of the skeleton. IMPRESSION: 1. No acute findings are noted in the abdomen or pelvis to account for the patient's symptoms. 2. Cholelithiasis without evidence of acute cholecystitis. 3. Mild aortic atherosclerosis. Electronically Signed   By: DVinnie LangtonM.D.   On: 12/06/2022 05:12   CT HEAD WO CONTRAST (5MM)  Result Date: 12/06/2022 CLINICAL DATA:  61year old female with altered mental status. Abdominal pain. EXAM: CT HEAD WITHOUT CONTRAST TECHNIQUE: Contiguous axial images were obtained from the base of the skull through the vertex without intravenous contrast. RADIATION DOSE REDUCTION: This exam was performed according to the departmental dose-optimization program which includes automated exposure control, adjustment of the mA and/or kV according to patient size and/or use of iterative reconstruction technique. COMPARISON:  Head CT 11/29/2022. FINDINGS: Brain: Cavum septum pellucidum, normal variant. Stable cerebral volume. No midline shift,  ventriculomegaly, mass effect, evidence of mass lesion, intracranial hemorrhage or evidence of cortically based acute infarction. Small and round left frontal lobe subcortical white matter hypodensity on series 3, image 24 is stable and nonspecific, with otherwise normal for age gray-white differentiation throughout the brain. This could be a small perivascular space (normal variant). Vascular: Mild Calcified atherosclerosis at the skull base. No suspicious intracranial vascular hyperdensity. Skull: No acute osseous abnormality identified. Sinuses/Orbits: Visualized paranasal sinuses and mastoids are stable and well aerated. Other: No acute  orbit or scalp soft tissue finding. Chronic left vertex scalp soft tissue scarring. IMPRESSION: No acute intracranial abnormality. Stable and essentially normal for age non contrast CT appearance of the brain. Electronically Signed   By: Genevie Ann M.D.   On: 12/06/2022 04:54    Procedures Procedures    Medications Ordered in ED Medications  sodium chloride 0.9 % bolus 500 mL (0 mLs Intravenous Stopped 12/06/22 0258)  lactated ringers bolus 1,000 mL (0 mLs Intravenous Stopped 12/06/22 0556)  iohexol (OMNIPAQUE) 350 MG/ML injection 100 mL (100 mLs Intravenous Contrast Given 12/06/22 0417)    ED Course/ Medical Decision Making/ A&P                           Medical Decision Making Amount and/or Complexity of Data Reviewed Labs: ordered. Radiology: ordered.  Risk Prescription drug management.   Patient brought to the ER for evaluation after syncopal episode.  Patient comes to the ER from home after passing out in the bathroom.  She has been experiencing multiple episodes of emesis.  Patient hypotensive initially for EMS, administered IV fluids with improvement.  Blood pressure did drop again here in the department but has responded to fluid resuscitation.  Patient is on Eliquis, it is unclear if she hit her head when she passed out.  CT head does not show any  injury.  Patient with a leukocytosis of 21.  She also has a lactic acidosis.  Etiology unclear.  She has afebrile.  CT abdomen and pelvis performed to further evaluate for etiology of leukocytosis in setting of abdominal pain, nausea, vomiting, diarrhea.  No acute abnormality is noted.  Patient had an episode of hypotension and near syncope 1 week ago.  She was seen at [facility] at that time.  Patient had cardiac evaluation and was discharged to follow-up with her cardiologist.  She has a cardiac CT pending this week.  Symptoms tonight were certainly more profound than what was described at previous visit.  Unclear what is causing these periods of low blood pressure, near-syncope and syncope.  Will ask hospitalist for admission.        Final Clinical Impression(s) / ED Diagnoses Final diagnoses:  Nausea vomiting and diarrhea  Syncope, unspecified syncope type    Rx / DC Orders ED Discharge Orders     None         Lurline Caver, Gwenyth Allegra, MD 12/06/22 [phone]

## 2022-12-06 NOTE — ED Notes (Signed)
Pt utilized bedside commode to urinate with no issues or dizziness

## 2022-12-06 NOTE — ED Notes (Signed)
RT called for CPAP. Ordered for bedtime but patient is sleeping now and desat in 80s on RA. SaO2 improves back to 96% when awoken.

## 2022-12-06 NOTE — ED Notes (Signed)
Pt assisted up to [facility]. Tolerated well. Requested privacy while using BSC. Call light within reach, verbalizes understanding to call for assistance when ready to get back to bed.

## 2022-12-06 NOTE — ED Notes (Signed)
Md pollina called pt hypotensive, more fluid ordered.

## 2022-12-06 NOTE — Progress Notes (Signed)
RT came to place patient on CPAP. Patient already on CPAP. 2L bleed in needed. Patient tolerating well at this time.

## 2022-12-06 NOTE — Progress Notes (Signed)
CPAP set up and at bedside. Pt stated she was not ready to go on for the night, RT informed pt to call for RT when ready to go on cpap for the night

## 2022-12-06 NOTE — ED Notes (Signed)
Pt refusing bedside commode or bedpan.

## 2022-12-06 NOTE — H&P (Signed)
History and Physical    Patient: [name] AVW:[id number] DOB: [birth year] DOA: 12/06/2022 DOS: the patient was seen and examined on 12/06/2022 PCP: Hayden Rasmussen, MD  Patient coming from: Home - lives with husband; NOK: Husband, [phone]   Chief Complaint: Syncope  HPI: [name] is a 61 y.o. female with medical history significant of afib; remote breast cancer; chronic diastolic CHF;  HTN; depression/anxiety; asthma; super morbid obesity; CAD; OSA; and DM presenting with syncope while vomiting. She reports that she started feeling very nauseated about 8-9pm.  She developed diarrhea and then vomiting.  About 1030, she realized she hadn't eaten or taken her dinner medications - but was too nauseated to do that.  She is not sure what time she last went to the bathroom but she passed out and her husband called 911.  EMS had her husband give chest compressions.  He turned her on her side and she vomited extensively.  She had diarrhea in the ER as recently as a little while ago, sample sent.  No sick contacts - she doesn't go out much.  She made a pot of goulash yesterday and ate it around 5pm; her husband ate it too and is feeling fine.    She went to the ER last Sunday.  Everything seemed fine but she stood up to get something off the shelf and she was very weak.  Her BP was 121/- earlier in the day, dropped to 90/53.  She went to the kitchen but was too weak to lift a bowl.  She had to sit down and her vision got splotchy.  She felt like she was going to pass out.  She was not having GI symptoms then at all.  She has been checking her BP through the week, once a day (not more often due to anxiety), at the same time every day.  She hasn't really checked it since last Sunday.    ER Course:  2nd ER visit in a week, at [facility] on 12/10 with low BP and near syncope.  On Flecainide for afib, due to coronary CT next week.  N/V/D, hypotension last night.  WBC 21, lactate elevated.  BP improved with  IVF.  CT A/P unremarkable.  Head CT negative.  BP dropped again when IVF stopped, responded to more fluids.     Review of Systems: As mentioned in the history of present illness. All other systems reviewed and are negative. Past Medical History:  Diagnosis Date   Anxiety 08/23/2014   Chronic heart failure with preserved ejection fraction (HFpEF) ([place])    a. 02/2000 MUGA: EF 68%; b. 06/2005 Echo: EF 55-65%; c. 06/2017 Echo: EF 60-65%; d. 08/2020 Echo: EF 60-65%, no rwma, nl RV fxn. Mild BAE. Mild MR.   Current moderate episode of major depressive disorder without prior episode ([place]) 12/31/2017   Dependent edema 10/05/2011   DNR (do not resuscitate) 12/06/2022   GERD (gastroesophageal reflux disease) 12/22/1999   Formatting of this note might be different from the original. Controlled by OTC meds   History of breast cancer 10/05/2011   Hypertension 10/05/2011   Long term current use of anticoagulant therapy 08/29/2014   Mild persistent asthma without complication 65/78/4696   Morbid obesity with BMI of 60.0-69.9, adult ([place]) 09/06/2018   Non-obstructive CAD (coronary artery disease)    a. 12/2017 Lexiscan PET/CT: mid anterior, apical lateral, and apical ischemia; b. 01/2018 Cath: LM nl, LAD & LCX 10-30% diff dzs throughout, RCA min irregs (<10%)-->Med  rx.   Obstructive sleep apnea, adult 09/06/2018   Paroxysmal atrial fibrillation ([place]) 04/21/2019   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/2014   Type 2 diabetes mellitus without complication, without long-term current use of insulin ([place]) 04/21/2019   Urinary bladder incontinence 06/13/2013   Past Surgical History:  Procedure Laterality Date   CARDIAC CATHETERIZATION  02/16/2018   No stents;Dr. Collene [place]ares, [place] [facility]   COLONOSCOPY WITH PROPOFOL N/A 11/28/2021   Procedure: COLONOSCOPY WITH PROPOFOL;  Surgeon: Carol Ada, MD;  Location: [facility];  Service: Endoscopy;  Laterality: N/A;   FINGER SURGERY Right    MASTECTOMY  Right    Social History:  reports that she quit smoking about 38 years ago. Her smoking use included cigarettes. She has a 1.20 pack-year smoking history. She has never used smokeless tobacco. She reports current alcohol use. She reports that she does not use drugs.  Allergies  Allergen Reactions   Erythromycin Diarrhea    Family History  Problem Relation Age of Onset   Heart Problems Mother    Hypertension Mother    Angina Mother    Atrial fibrillation Mother    Heart Problems Father    Hypertension Father    Kidney Stones Sister    Kidney cancer Neg Hx    Prostate cancer Neg Hx    Bladder Cancer Neg Hx     Prior to Admission medications   Medication Sig Start Date End Date Taking? Authorizing Provider  albuterol (PROVENTIL HFA;VENTOLIN HFA) 108 (90 Base) MCG/ACT inhaler Inhale 2 puffs into the lungs every 6 (six) hours as needed for wheezing or shortness of breath. 04/10/17   Barry Dienes, NP  ALPRAZolam Duanne Moron) 0.5 MG tablet Take 0.25-0.5 mg by mouth daily as needed. Patient not taking: Reported on 11/20/2022 08/20/22   [provider]  apixaban (ELIQUIS) 5 MG TABS tablet Take 1 tablet (5 mg total) by mouth every 12 (twelve) hours. 10/01/22   Theora Gianotti, NP  Cholecalciferol (VITAMIN D3) 75 MCG (3000 UT) TABS Take 3,000 mg by mouth daily.    [provider]  clonazePAM (KLONOPIN) 0.5 MG tablet Take 0.5 mg by mouth daily. 11/19/22   [provider]  fexofenadine (ALLEGRA) 180 MG tablet Take 180 mg by mouth daily.    [provider]  flecainide (TAMBOCOR) 100 MG tablet Take 1 tablet (100 mg total) by mouth 2 (two) times daily. 11/26/22   Theora Gianotti, NP  fluticasone (FLONASE) 50 MCG/ACT nasal spray Place 1 spray into both nostrils daily as needed for allergies.    [provider]  furosemide (LASIX) 40 MG tablet Take 40 mg by mouth daily.    [provider]  lamoTRIgine (LAMICTAL) 100 MG tablet Take 25 mg  by mouth every 12 (twelve) hours.    [provider]  levothyroxine (SYNTHROID) 50 MCG tablet Take 50 mcg by mouth daily before breakfast.    [provider]  lisinopril (PRINIVIL,ZESTRIL) 40 MG tablet Take 40 mg by mouth daily.    [provider]  metFORMIN (GLUCOPHAGE) 1000 MG tablet Take 1,000 mg by mouth every 12 (twelve) hours. 07/04/20   [provider]  metoprolol tartrate (LOPRESSOR) 100 MG tablet Take 1 tablet (100 mg total) by mouth once for 1 dose. Take 2 hours prior to cardiac CT 11/20/22 11/20/22  Theora Gianotti, NP  montelukast (SINGULAIR) 10 MG tablet Take 10 mg by mouth at bedtime.    [provider]  omeprazole (PRILOSEC) 20 MG  capsule Take 20 mg by mouth every 12 (twelve) hours.    [provider]  Prenatal Vit-Fe Fumarate-FA (PRENATAL MULTIVITAMIN) TABS tablet Take 1 tablet by mouth daily. With iron    [provider]  verapamil (CALAN-SR) 180 MG CR tablet Take 2 tablets (360 mg total) by mouth daily. 09/14/22   Theora Gianotti, NP    Physical Exam: Vitals:   12/06/22 1045 12/06/22 1051 12/06/22 1100 12/06/22 1500  BP: (!) 106/51  128/66 100/76  Pulse: 87  96 97  Resp: (!) '25  17 19  '$ Temp:  99.1 F (37.3 C)  99.6 F (37.6 C)  TempSrc:  Oral  Oral  SpO2: 92%  96% 97%   General:  Appears calm and comfortable and is in NAD, sedentary Eyes:  EOMI, normal lids, iris ENT:  grossly normal hearing, lips & tongue, mildly dry mm Neck:  no LAD, masses or thyromegaly Cardiovascular:  RRR, no m/r/g. No LE edema.  Respiratory:   CTA bilaterally with no wheezes/rales/rhonchi.  Normal respiratory effort. Abdomen:  soft, NT, ND Skin:  no rash or induration seen on limited exam Musculoskeletal:  grossly normal tone BUE/BLE, good ROM, no bony abnormality Psychiatric:  blunted mood and affect, speech fluent and appropriate, AOx3 Neurologic:  CN 2-12 grossly intact, moves all extremities in coordinated  fashion   Radiological Exams on Admission: Independently reviewed - see discussion in A/P where applicable  CT ABDOMEN PELVIS W CONTRAST  Result Date: 12/06/2022 CLINICAL DATA:  61 year old female with history of acute onset of nonlocalized abdominal pain. EXAM: CT ABDOMEN AND PELVIS WITH CONTRAST TECHNIQUE: Multidetector CT imaging of the abdomen and pelvis was performed using the standard protocol following bolus administration of intravenous contrast. RADIATION DOSE REDUCTION: This exam was performed according to the departmental dose-optimization program which includes automated exposure control, adjustment of the mA and/or kV according to patient size and/or use of iterative reconstruction technique. CONTRAST:  118m OMNIPAQUE IOHEXOL 350 MG/ML SOLN COMPARISON:  CT of the abdomen and pelvis 07/31/2020. FINDINGS: Lower chest: Unremarkable. Hepatobiliary: No suspicious cystic or solid hepatic lesions. No intra or extrahepatic biliary ductal dilatation. Numerous calcified gallstones lying dependently in the gallbladder. Gallbladder is moderately distended. Gallbladder wall thickness appears normal. No pericholecystic fluid or surrounding inflammatory changes. Pancreas: No pancreatic mass. No pancreatic ductal dilatation. No pancreatic or peripancreatic fluid collections or inflammatory changes. Spleen: Unremarkable. Adrenals/Urinary Tract: Bilateral kidneys and bilateral adrenal glands are normal in appearance. No hydroureteronephrosis. Urinary bladder is nearly decompressed, but otherwise unremarkable in appearance. Stomach/Bowel: The appearance of the stomach is normal. There is no pathologic dilatation of small bowel or colon. The appendix is not confidently identified and may be surgically absent. Regardless, there are no inflammatory changes noted adjacent to the cecum to suggest the presence of an acute appendicitis at this time. Vascular/Lymphatic: Minimal atherosclerotic calcifications are noted  in the abdominal aorta. No lymphadenopathy noted in the abdomen or pelvis. Reproductive: Uterus and ovaries are unremarkable in appearance. Other: No significant volume of ascites.  No pneumoperitoneum. Musculoskeletal: There are no aggressive appearing lytic or blastic lesions noted in the visualized portions of the skeleton. IMPRESSION: 1. No acute findings are noted in the abdomen or pelvis to account for the patient's symptoms. 2. Cholelithiasis without evidence of acute cholecystitis. 3. Mild aortic atherosclerosis. Electronically Signed   By: DVinnie LangtonM.D.   On: 12/06/2022 05:12   CT HEAD WO CONTRAST (5MM)  Result Date: 12/06/2022 CLINICAL DATA:  61year old female with altered mental  status. Abdominal pain. EXAM: CT HEAD WITHOUT CONTRAST TECHNIQUE: Contiguous axial images were obtained from the base of the skull through the vertex without intravenous contrast. RADIATION DOSE REDUCTION: This exam was performed according to the departmental dose-optimization program which includes automated exposure control, adjustment of the mA and/or kV according to patient size and/or use of iterative reconstruction technique. COMPARISON:  Head CT 11/29/2022. FINDINGS: Brain: Cavum septum pellucidum, normal variant. Stable cerebral volume. No midline shift, ventriculomegaly, mass effect, evidence of mass lesion, intracranial hemorrhage or evidence of cortically based acute infarction. Small and round left frontal lobe subcortical white matter hypodensity on series 3, image 24 is stable and nonspecific, with otherwise normal for age gray-white differentiation throughout the brain. This could be a small perivascular space (normal variant). Vascular: Mild Calcified atherosclerosis at the skull base. No suspicious intracranial vascular hyperdensity. Skull: No acute osseous abnormality identified. Sinuses/Orbits: Visualized paranasal sinuses and mastoids are stable and well aerated. Other: No acute orbit or scalp  soft tissue finding. Chronic left vertex scalp soft tissue scarring. IMPRESSION: No acute intracranial abnormality. Stable and essentially normal for age non contrast CT appearance of the brain. Electronically Signed   By: Genevie Ann M.D.   On: 12/06/2022 04:54    EKG: Independently reviewed.  NSR with rate 82; no evidence of acute ischemia   Labs on Admission: I have personally reviewed the available labs and imaging studies at the time of the admission.  Pertinent labs:    K+ 3.4 Glucose 174 Albumin 3.1 Lactate 3.2 WBC 21.5 Hgb 11.7 COVID/flu/RSV negative UA unremarkable   Assessment and Plan: Principal Problem:   Gastroenteritis Active Problems:   Essential hypertension   Paroxysmal atrial fibrillation (H[facility])   Chronic heart failure with preserved ejection fraction (HFpEF) (H[facility])   Type 2 diabetes mellitus without complication, without long-term current use of insulin ([facility])   Sleep apnea   Depression   Morbid obesity with BMI of 60.0-69.9, adult ([place])   DNR (do not resuscitate)    Gastroenteritis -Patient presenting with acute onset of n/v/d yesterday -She was already having mild hypotension and this appears to have exacerbated her issue -She had a resultant episode of syncope while at home with a short period of chest compressions by her husband -She has had recurrent volume-responsive hypotension in the ER -Will observe overnight on telemetry with IVF -stool studies are pending  Hypotension, h/o HTN -Patient with h/o HTN but marginal BPs throughout her ER stay -She is volume responsive and likely recurrently hypovolemic from GI illness -Will hold home BP medications (lisinopril, verapamil)  Afib -Rate controlled with flecainide (continue) and verapamil (hold due to hypotension) -Continue Eliquis  DM -Will check A1c -hold Glucophage -Cover with resistant-scale SSI   Chronic diastolic CHF -Normal echo in 2021 -Hold Lasix  CAD -She is due for a coronary CTA  tomorrow afternoon -Ideally, she will be discharged in the AM and can attend this appointment -Otherwise, it will need to be rescheduled  Depression/anxiety -No thoughts of self-harm but she does feel like her life is very difficult and she would welcome death if it comes -Continue Klonopin, Lamictal  Hypothyroidism -Continue Synthroid  OSA -Continue CPAP  Morbid obesity -BMI 61.1 -Weight loss should be encouraged -Outpatient PCP/bariatric medicine f/u encouraged  -She reports that she is minimally able to leave the house at this point  DNR -I have discussed code status with the patient and her husband and  they are in agreement that the patient would not desire resuscitation  and would prefer to die a natural death should that situation arise. -She will need a gold out of facility DNR form at the time of discharge      Advance Care Planning:   Code Status: DNR   Consults: PT/OT; nutrition; TOC team  DVT Prophylaxis: Eliquis  Family Communication: Husband was present throughout evaluation  Severity of Illness: The appropriate patient status for this patient is OBSERVATION. Observation status is judged to be reasonable and necessary in order to provide the required intensity of service to ensure the patient's safety. The patient's presenting symptoms, physical exam findings, and initial radiographic and laboratory data in the context of their medical condition is felt to place them at decreased risk for further clinical deterioration. Furthermore, it is anticipated that the patient will be medically stable for discharge from the hospital within 2 midnights of admission.   Author: Karmen Bongo, MD 12/06/2022 3:44 PM  For on call review [email or web address].

## 2022-12-06 NOTE — ED Notes (Signed)
Called lab to have A1C added to previous specimen

## 2022-12-07 ENCOUNTER — Ambulatory Visit: Admission: RE | Admit: 2022-12-07 | Payer: 59 | Source: Ambulatory Visit

## 2022-12-07 DIAGNOSIS — K529 Noninfective gastroenteritis and colitis, unspecified: Secondary | ICD-10-CM | POA: Diagnosis not present

## 2022-12-07 LAB — BASIC METABOLIC PANEL
Anion gap: 10 (ref 5–15)
Anion gap: 10 (ref 5–15)
BUN: 12 mg/dL (ref 8–23)
BUN: 9 mg/dL (ref 8–23)
CO2: 26 mmol/L (ref 22–32)
CO2: 25 mmol/L (ref 22–32)
Calcium: 8 mg/dL — ABNORMAL LOW (ref 8.9–10.3)
Calcium: 8.5 mg/dL — ABNORMAL LOW (ref 8.9–10.3)
Chloride: 102 mmol/L (ref 98–111)
Chloride: 101 mmol/L (ref 98–111)
Creatinine, Ser: 0.7 mg/dL (ref 0.44–1.00)
Creatinine, Ser: 0.76 mg/dL (ref 0.44–1.00)
GFR, Estimated: >60 mL/min (ref >60)
GFR, Estimated: >60 mL/min (ref >60)
Glucose, Bld: 107 mg/dL — ABNORMAL HIGH (ref 70–99)
Glucose, Bld: 99 mg/dL (ref 70–99)
Potassium: 3.3 mmol/L — ABNORMAL LOW (ref 3.5–5.1)
Potassium: 3.7 mmol/L (ref 3.5–5.1)
Sodium: 138 mmol/L (ref 135–145)
Sodium: 136 mmol/L (ref 135–145)

## 2022-12-07 LAB — GLUCOSE, CAPILLARY
Glucose-Capillary: 90 mg/dL (ref 70–99)
Glucose-Capillary: 90 mg/dL (ref 70–99)
Glucose-Capillary: 116 mg/dL — ABNORMAL HIGH (ref 70–99)

## 2022-12-07 LAB — GASTROINTESTINAL PANEL BY PCR, STOOL (REPLACES STOOL CULTURE)

## 2022-12-07 LAB — CBC
HCT: 31.8 % — ABNORMAL LOW (ref 36.0–46.0)
Hemoglobin: 10.7 g/dL — ABNORMAL LOW (ref 12.0–15.0)
MCH: 29.2 pg (ref 26.0–34.0)
MCHC: 33.6 g/dL (ref 30.0–36.0)
MCV: 86.6 fL (ref 80.0–100.0)
Platelets: 259 10*3/uL (ref 150–400)
RBC: 3.67 MIL/uL — ABNORMAL LOW (ref 3.87–5.11)
RDW: 14.7 % (ref 11.5–15.5)
WBC: 6.6 10*3/uL (ref 4.0–10.5)
nRBC: 0 % (ref 0.0–0.2)

## 2022-12-07 LAB — CBG MONITORING, ED: Glucose-Capillary: 117 mg/dL — ABNORMAL HIGH (ref 70–99)

## 2022-12-07 LAB — HEMOGLOBIN A1C
Hgb A1c MFr Bld: 6.2 % — ABNORMAL HIGH (ref 4.8–5.6)
Mean Plasma Glucose: 131 mg/dL

## 2022-12-07 MED ORDER — KCL-LACTATED RINGERS 20 MEQ/L IV SOLN
INTRAVENOUS | Status: DC
  Filled 2022-12-07 (×2): qty 1000

## 2022-12-07 MED ORDER — POTASSIUM CHLORIDE 2 MEQ/ML IV SOLN
INTRAVENOUS | Status: DC
  Filled 2022-12-07 (×2): qty 1000

## 2022-12-07 NOTE — Evaluation (Signed)
Occupational Therapy Evaluation Patient Details Name: [name] MRN: [id number] DOB: 09-08-1961 Today's Date: 12/07/2022   History of Present Illness Pt is a 61 y/o female presenting on 12/17 with syncope while vomiting. Admitted with acute gastroenteritis with mild hypotension. CT negative. PMH includes: anxiety, HTN, morbid obesity, DM type 2, CHF, s/p R mastectomy.   Clinical Impression   PTA patient reports independent. Admitted for above and presents with problem list below.  She completes bed mobility, transfers and mobility using hiking stick with min guard assist; ADLS with min to min guard assist.  VSS during session, pt reports feeling generalized weakness.  Based on performance today, believe she will best benefit from further OT services acutely to optimize independence and return to PLOF, but anticipate no further needs after dc home.       Recommendations for follow up therapy are one component of a multi-disciplinary discharge planning process, led by the attending physician.  Recommendations may be updated based on patient status, additional functional criteria and insurance authorization.   Follow Up Recommendations  No OT follow up     Assistance Recommended at Discharge Intermittent Supervision/Assistance  Patient can return home with the following A little help with walking and/or transfers;A little help with bathing/dressing/bathroom;Help with stairs or ramp for entrance;Assistance with cooking/housework    Functional Status Assessment  Patient has had a recent decline in their functional status and demonstrates the ability to make significant improvements in function in a reasonable and predictable amount of time.  Equipment Recommendations  None recommended by OT    Recommendations for Other Services       Precautions / Restrictions Precautions Precautions: Fall Restrictions Weight Bearing Restrictions: No      Mobility Bed Mobility                General bed mobility comments: edge of stretcher upon entry, returned back to long sitting with min guard for safety    Transfers Overall transfer level: Needs assistance   Transfers: Sit to/from Stand Sit to Stand: Min guard           General transfer comment: for safety/balance      Balance Overall balance assessment: Mild deficits observed, not formally tested                                         ADL either performed or assessed with clinical judgement   ADL Overall ADL's : Needs assistance/impaired     Grooming: Set up;Sitting           Upper Body Dressing : Set up;Sitting   Lower Body Dressing: Min guard;Sit to/from stand   Toilet Transfer: Min guard;Ambulation Toilet Transfer Details (indicate cue type and reason): using hiking stick in room Toileting- Clothing Manipulation and Hygiene: Minimal assistance;Sit to/from stand Toileting - Clothing Manipulation Details (indicate cue type and reason): pt reports needing assist for hygiene since admission     Functional mobility during ADLs: Min guard General ADL Comments: using hiking stick, pt limited by decreased activity tolerance and generalized weakness     Vision   Vision Assessment?: No apparent visual deficits     Perception     Praxis      Pertinent Vitals/Pain Pain Assessment Pain Assessment: No/denies pain     Hand Dominance     Extremity/Trunk Assessment Upper Extremity Assessment Upper Extremity Assessment: Overall WFL for tasks  assessed   Lower Extremity Assessment Lower Extremity Assessment: Defer to PT evaluation   Cervical / Trunk Assessment Cervical / Trunk Assessment: Other exceptions Cervical / Trunk Exceptions: increased body habitus   Communication Communication Communication: No difficulties   Cognition Arousal/Alertness: Awake/alert Behavior During Therapy: WFL for tasks assessed/performed Overall Cognitive Status: Within Functional Limits for  tasks assessed                                       General Comments  spouse present and supportive    Exercises     Shoulder Instructions      Home Living Family/patient expects to be discharged to:: Private residence Living Arrangements: Spouse/significant other Available Help at Discharge: Family Type of Home: House Home Access: Ramped entrance     Home Layout: One level     Bathroom Shower/Tub: Occupational psychologist: Handicapped height     Home Equipment: Grab bars - toilet;Grab bars - tub/shower;Other (comment) (hiking stick)          Prior Functioning/Environment Prior Level of Function : Independent/Modified Independent             Mobility Comments: using hiking stick for mobility ADLs Comments: independent ADLs, IADLs        OT Problem List: Decreased strength;Decreased activity tolerance;Impaired balance (sitting and/or standing);Obesity      OT Treatment/Interventions: Self-care/ADL training;Therapeutic exercise;DME and/or AE instruction;Therapeutic activities;Patient/family education;Balance training    OT Goals(Current goals can be found in the care plan section) Acute Rehab OT Goals Patient Stated Goal: home, feel better OT Goal Formulation: With patient Time For Goal Achievement: 12/21/22 Potential to Achieve Goals: Good  OT Frequency: Min 2X/week    Co-evaluation              AM-PAC OT "6 Clicks" Daily Activity     Outcome Measure Help from another person eating meals?: None Help from another person taking care of personal grooming?: A Little Help from another person toileting, which includes using toliet, bedpan, or urinal?: A Little Help from another person bathing (including washing, rinsing, drying)?: A Little Help from another person to put on and taking off regular upper body clothing?: A Little Help from another person to put on and taking off regular lower body clothing?: A Little 6 Click  Score: 19   End of Session Equipment Utilized During Treatment: Other (comment) (hiking stick) Nurse Communication: Mobility status  Activity Tolerance: Patient tolerated treatment well Patient left: with call bell/phone within reach;with family/visitor present;in bed  OT Visit Diagnosis: Other abnormalities of gait and mobility (R26.89);Muscle weakness (generalized) (M62.81)                Time: 6283-1517 OT Time Calculation (min): 20 min Charges:  OT General Charges $OT Visit: 1 Visit OT Evaluation $OT Eval Low Complexity: [place], OT Acute Rehabilitation Services Office [phone]   [name] 12/07/2022, 11:35 AM

## 2022-12-07 NOTE — Assessment & Plan Note (Signed)
Home metformin on hold since received IV contrast in ER Continue to follow CBGs and provide SSI Hemoglobin A1c pending

## 2022-12-07 NOTE — Assessment & Plan Note (Addendum)
Lasix and ACE inhibitor on hold until GI symptoms have resolved

## 2022-12-07 NOTE — ED Notes (Signed)
Patient sleeping in bed wearing CPAP.

## 2022-12-07 NOTE — Assessment & Plan Note (Addendum)
Sleep apnea on chronic CPAP Staff made aware that patient needs bariatric bed Given significantly elevated BMI rehydration needs are greater than the average patient

## 2022-12-07 NOTE — Hospital Course (Signed)
61 y.o. female with medical history significant of afib; remote breast cancer; chronic diastolic CHF;  HTN; depression/anxiety; asthma; super morbid obesity; CAD; OSA; and DM presenting with syncope while vomiting. She reports that she started feeling very nauseated about 8-9pm.  She developed diarrhea and then vomiting.  About 1030, she realized she hadn't eaten or taken her dinner medications - but was too nauseated to do that.  She is not sure what time she last went to the bathroom but she passed out and her husband called 911.  EMS had her husband give chest compressions.  He turned her on her side and she vomited extensively.  She had diarrhea in the ER as recently as a little while ago, sample sent.  No sick contacts - she doesn't go out much.  She made a pot of goulash yesterday and ate it around 5pm; her husband ate it too and is feeling fine.     She went to the ER last Sunday.  Everything seemed fine but she stood up to get something off the shelf and she was very weak.  Her BP was 121/- earlier in the day, dropped to 90/53.  She went to the kitchen but was too weak to lift a bowl.  She had to sit down and her vision got splotchy.  She felt like she was going to pass out.  She was not having GI symptoms then at all.  She has been checking her BP through the week, once a day (not more often due to anxiety), at the same time every day.  She hasn't really checked it since last Sunday.  Since admission patient's WBC has decreased from 21,000 to 6000 after rehydration.  GI symptoms have improved so plan is to advance to clear liquid diet and consider advancing to full liquids.  Follow-up lactic acid pending.  Potassium remains less than 4.0 plan is to replete IV.  Patient continues to have mild GI symptoms and with recent diarrhea plan is to avoid oral replacement of potassium.

## 2022-12-07 NOTE — Evaluation (Signed)
Physical Therapy Evaluation Patient Details Name: [name] MRN: [id number] DOB: 02/09/1961 Today's Date: 12/07/2022  History of Present Illness  Pt is a 61 y/o female presenting on 12/17 with syncope while vomiting. Admitted with acute gastroenteritis with mild hypotension.  Pt found to have Norovirus. CT negative. PMH includes: anxiety, HTN, morbid obesity, DM type 2, CHF, s/p R mastectomy.  Clinical Impression  Pt admitted with above diagnosis. At baseline, pt independent and ambulatory with use of hiking stick.  She reports significant improvement since admission and was able to ambulate 4' with min guard for safety and VSS.  Pt with mild decrease in balance and mobility.  Will maintain on caseload to advance to baseline, but likely no PT needs at d/c. Pt currently with functional limitations due to the deficits listed below (see PT Problem List). Pt will benefit from skilled PT to increase their independence and safety with mobility to allow discharge to the venue listed below.          Recommendations for follow up therapy are one component of a multi-disciplinary discharge planning process, led by the attending physician.  Recommendations may be updated based on patient status, additional functional criteria and insurance authorization.  Follow Up Recommendations No PT follow up      Assistance Recommended at Discharge PRN  Patient can return home with the following  Assistance with cooking/housework;Help with stairs or ramp for entrance    Equipment Recommendations None recommended by PT  Recommendations for Other Services       Functional Status Assessment Patient has had a recent decline in their functional status and demonstrates the ability to make significant improvements in function in a reasonable and predictable amount of time.     Precautions / Restrictions Precautions Precautions: Fall      Mobility  Bed Mobility Overal bed mobility: Independent              General bed mobility comments: Sitting EOB at arrival    Transfers Overall transfer level: Needs assistance Equipment used: None Transfers: Sit to/from Stand Sit to Stand: Supervision                Ambulation/Gait Ambulation/Gait assistance: Min guard Gait Distance (Feet): 80 Feet Assistive device:  (hiking stick) Gait Pattern/deviations: Step-through pattern, Decreased stride length, Wide base of support Gait velocity: decreased     General Gait Details: Pt ambulates with hiking stick; mild instability requiring min guard for safety but no overt LOB  Stairs            Wheelchair Mobility    Modified Rankin (Stroke Patients Only)       Balance Overall balance assessment: Needs assistance Sitting-balance support: No upper extremity supported Sitting balance-Leahy Scale: Good     Standing balance support: No upper extremity supported, Single extremity supported Standing balance-Leahy Scale: Fair Standing balance comment: cane/hiking stick to ambulate but could stand without support                             Pertinent Vitals/Pain Pain Assessment Pain Assessment: No/denies pain    Home Living Family/patient expects to be discharged to:: Private residence Living Arrangements: Spouse/significant other Available Help at Discharge: Family Type of Home: House Home Access: Ramped entrance       Home Layout: One level Home Equipment: Grab bars - toilet;Grab bars - tub/shower;Other (comment) Additional Comments: hiking stick    Prior Function Prior Level of Function :  Independent/Modified Independent             Mobility Comments: using hiking stick for mobility ; ambulates short community distances ADLs Comments: independent ADLs, IADLs     Hand Dominance        Extremity/Trunk Assessment   Upper Extremity Assessment Upper Extremity Assessment: Overall WFL for tasks assessed    Lower Extremity Assessment Lower  Extremity Assessment: Overall WFL for tasks assessed    Cervical / Trunk Assessment Cervical / Trunk Assessment: Other exceptions Cervical / Trunk Exceptions: increased body habitus  Communication   Communication: No difficulties  Cognition Arousal/Alertness: Awake/alert Behavior During Therapy: WFL for tasks assessed/performed Overall Cognitive Status: Within Functional Limits for tasks assessed                                          General Comments      Exercises     Assessment/Plan    PT Assessment Patient needs continued PT services  PT Problem List Decreased strength;Cardiopulmonary status limiting activity;Decreased activity tolerance;Decreased knowledge of use of DME;Decreased balance;Decreased mobility;Decreased knowledge of precautions       PT Treatment Interventions DME instruction;Therapeutic exercise;Gait training;Balance training;Functional mobility training;Therapeutic activities;Patient/family education    PT Goals (Current goals can be found in the Care Plan section)  Acute Rehab PT Goals Patient Stated Goal: return home PT Goal Formulation: With patient Time For Goal Achievement: 12/21/22 Potential to Achieve Goals: Good    Frequency Min 3X/week     Co-evaluation               AM-PAC PT "6 Clicks" Mobility  Outcome Measure Help needed turning from your back to your side while in a flat bed without using bedrails?: None Help needed moving from lying on your back to sitting on the side of a flat bed without using bedrails?: None Help needed moving to and from a bed to a chair (including a wheelchair)?: A Little Help needed standing up from a chair using your arms (e.g., wheelchair or bedside chair)?: A Little Help needed to walk in hospital room?: A Little Help needed climbing 3-5 steps with a railing? : A Little 6 Click Score: 20    End of Session   Activity Tolerance: Patient tolerated treatment well Patient left: in  bed;with call bell/phone within reach Nurse Communication: Mobility status PT Visit Diagnosis: Other abnormalities of gait and mobility (R26.89)    Time: 7902-4097 PT Time Calculation (min) (ACUTE ONLY): 17 min   Charges:   PT Evaluation $PT Eval Low Complexity: 1 Low          Gustaf Mccarter, PT Acute Rehab [facility] [phone]   [name] 12/07/2022, 4:18 PM

## 2022-12-07 NOTE — Assessment & Plan Note (Addendum)
Associated severe dehydration and hypokalemia Initial serum lactate 3.2 with repeat pending Influenza A/B, RSV and SARS PCR negative Initial urinalysis consistent with dehydration with urine specific gravity greater than 1.046 Initial stool test negative for C. difficile, GI panel pending Potassium remains below 4 at 3.3 despite lactated Ringer's infusion-will add potassium to LR and continue at 100 cc/h States is hungry and has tolerated sips of clears so we will begin clear liquid diet and if tolerates can advance to full liquid diet

## 2022-12-07 NOTE — Progress Notes (Signed)
Progress Note   Patient: [name] EQA:[id number] DOB: Apr 20, 1961 DOA: 12/06/2022     0 DOS: the patient was seen and examined on 12/07/2022   Brief hospital course:  61 y.o. female with medical history significant of afib; remote breast cancer; chronic diastolic CHF;  HTN; depression/anxiety; asthma; super morbid obesity; CAD; OSA; and DM presenting with syncope while vomiting. She reports that she started feeling very nauseated about 8-9pm.  She developed diarrhea and then vomiting.  About 1030, she realized she hadn't eaten or taken her dinner medications - but was too nauseated to do that.  She is not sure what time she last went to the bathroom but she passed out and her husband called 911.  EMS had her husband give chest compressions.  He turned her on her side and she vomited extensively.  She had diarrhea in the ER as recently as a little while ago, sample sent.  No sick contacts - she doesn't go out much.  She made a pot of goulash yesterday and ate it around 5pm; her husband ate it too and is feeling fine.     She went to the ER last Sunday.  Everything seemed fine but she stood up to get something off the shelf and she was very weak.  Her BP was 121/- earlier in the day, dropped to 90/53.  She went to the kitchen but was too weak to lift a bowl.  She had to sit down and her vision got splotchy.  She felt like she was going to pass out.  She was not having GI symptoms then at all.  She has been checking her BP through the week, once a day (not more often due to anxiety), at the same time every day.  She hasn't really checked it since last Sunday.  Since admission patient's WBC has decreased from 21,000 to 6000 after rehydration.  GI symptoms have improved so plan is to advance to clear liquid diet and consider advancing to full liquids.  Follow-up lactic acid pending.  Potassium remains less than 4.0 plan is to replete IV.  Patient continues to have mild GI symptoms and with recent diarrhea  plan is to avoid oral replacement of potassium.  Assessment and Plan: * Gastroenteritis Associated severe dehydration and hypokalemia Initial serum lactate 3.2 with repeat pending Influenza A/B, RSV and SARS PCR negative Initial urinalysis consistent with dehydration with urine specific gravity greater than 1.046 Initial stool test negative for C. difficile, GI panel pending Potassium remains below 4 at 3.3 despite lactated Ringer's infusion-will add potassium to LR and continue at 100 cc/h States is hungry and has tolerated sips of clears so we will begin clear liquid diet and if tolerates can advance to full liquid diet    Morbid obesity with BMI of 60.0-69.9, adult (HCC) Sleep apnea on chronic CPAP Staff made aware that patient needs bariatric bed Given significantly elevated BMI rehydration needs are greater than the average patient  Type 2 diabetes mellitus without complication, without long-term current use of insulin (HCC) Home metformin on hold since received IV contrast in ER Continue to follow CBGs and provide SSI Hemoglobin A1c pending  Chronic heart failure with preserved ejection fraction (HFpEF) (HCC) Lasix and ACE inhibitor on hold until GI symptoms have resolved  Paroxysmal atrial fibrillation (HCC) Currently rate controlled Continue to hold verapamil until GI symptoms have improved Continue Eliquis Goal is a potassium of greater than 4.0  Essential hypertension Current blood pressure controlled Continue as  needed IV hydralazine Continue to hold Prinivil until stable enough for discharge         Subjective: Continues to report some nausea.  Last diarrheal stool around 6 PM last night.  Describes as looking like "algae water".  Denies abdominal pain at this time  Physical Exam: Vitals:   12/07/22 0400 12/07/22 0555 12/07/22 0700 12/07/22 0800  BP: 117/72 130/70 128/68 131/72  Pulse: 70 82 82 78  Resp: '20 19  18  '$ Temp:  98.4 F (36.9 C)    TempSrc:   Axillary    SpO2: 100% 98% 100% 98%   Constitutional: NAD, calm, comfortable Respiratory: clear to auscultation bilaterally, no wheezing, no crackles. Normal respiratory effort. No accessory muscle use.  Reports difficulty tolerating hospital provided CPAP mask Cardiovascular: Regular rate and rhythm, no murmurs / rubs / gallops. No extremity edema. 2+ pedal pulses.  Extremities warm to touch Abdomen: no tenderness, no masses palpated. Bowel sounds positive but are hypoactive.   Skin: no rashes, lesions, ulcers. No induration Neurologic: CN 2-12 grossly intact. Sensation intact, DTR normal. Strength 5/5 x all 4 extremities.  Psychiatric: Normal judgment and insight. Alert and oriented x 3. Normal mood.   Data Reviewed:  See documentation in the notes  Family Communication: Patient only  VTE prophylaxis: Eliquis  Disposition: Status is: Observation The patient remains OBS appropriate and will d/c before 2 midnights.  Planned Discharge Destination: Home    Time spent: 35 minutes  Author: Erin Hearing, NP 12/07/2022 9:29 AM  For on call review [email or web address].

## 2022-12-07 NOTE — ED Notes (Signed)
Sitting on the side of the bed

## 2022-12-07 NOTE — Assessment & Plan Note (Addendum)
Current blood pressure controlled Continue as needed IV hydralazine Continue to hold Prinivil until stable enough for discharge

## 2022-12-07 NOTE — Progress Notes (Addendum)
Patient feeling better-- will advance diet as she hungry.  Suspect can be d/c'd home in AM if continues.  Patient did disclose that she wanted to be a full code.  She would not want peg tube or trach-- encouraged her to have conversation with her husband.  GI pathogen panel- norovirus.  Eulogio Bear DO

## 2022-12-07 NOTE — Assessment & Plan Note (Addendum)
Currently rate controlled Continue to hold verapamil until GI symptoms have improved Continue Eliquis Goal is a potassium of greater than 4.0

## 2022-12-08 ENCOUNTER — Encounter: Payer: Self-pay | Admitting: Internal Medicine

## 2022-12-08 DIAGNOSIS — K529 Noninfective gastroenteritis and colitis, unspecified: Secondary | ICD-10-CM | POA: Diagnosis not present

## 2022-12-08 LAB — CBC
HCT: 30.5 % — ABNORMAL LOW (ref 36.0–46.0)
Hemoglobin: 10.3 g/dL — ABNORMAL LOW (ref 12.0–15.0)
MCH: 29.3 pg (ref 26.0–34.0)
MCHC: 33.8 g/dL (ref 30.0–36.0)
MCV: 86.9 fL (ref 80.0–100.0)
Platelets: 215 10*3/uL (ref 150–400)
RBC: 3.51 MIL/uL — ABNORMAL LOW (ref 3.87–5.11)
RDW: 14.6 % (ref 11.5–15.5)
WBC: 7.3 10*3/uL (ref 4.0–10.5)
nRBC: 0 % (ref 0.0–0.2)

## 2022-12-08 LAB — BASIC METABOLIC PANEL
Anion gap: 10 (ref 5–15)
BUN: 7 mg/dL — ABNORMAL LOW (ref 8–23)
CO2: 26 mmol/L (ref 22–32)
Calcium: 8.5 mg/dL — ABNORMAL LOW (ref 8.9–10.3)
Chloride: 101 mmol/L (ref 98–111)
Creatinine, Ser: 0.73 mg/dL (ref 0.44–1.00)
GFR, Estimated: >60 mL/min (ref >60)
Glucose, Bld: 105 mg/dL — ABNORMAL HIGH (ref 70–99)
Potassium: 3.5 mmol/L (ref 3.5–5.1)
Sodium: 137 mmol/L (ref 135–145)

## 2022-12-08 LAB — GLUCOSE, CAPILLARY
Glucose-Capillary: 111 mg/dL — ABNORMAL HIGH (ref 70–99)
Glucose-Capillary: 115 mg/dL — ABNORMAL HIGH (ref 70–99)

## 2022-12-08 MED ORDER — VERAPAMIL HCL ER 180 MG PO TBCR: 360.0000 mg | EXTENDED_RELEASE_TABLET | Freq: Every day | ORAL | 3 refills | Status: DC

## 2022-12-08 MED ORDER — LISINOPRIL 40 MG PO TABS: 40.0000 mg | ORAL_TABLET | Freq: Every day | ORAL | Status: DC

## 2022-12-08 MED ORDER — FUROSEMIDE 40 MG PO TABS: 40.0000 mg | ORAL_TABLET | Freq: Every day | ORAL | Status: DC

## 2022-12-08 MED ORDER — VERAPAMIL HCL ER 180 MG PO TBCR
180.0000 mg | EXTENDED_RELEASE_TABLET | Freq: Every day | ORAL | Status: DC
  Administered 2022-12-08: 180 mg via ORAL
  Filled 2022-12-08: qty 1

## 2022-12-08 MED ORDER — ONDANSETRON 4 MG PO TBDP: 4.0000 mg | ORAL_TABLET | Freq: Three times a day (TID) | ORAL | 0 refills | Status: DC | PRN

## 2022-12-08 NOTE — Discharge Summary (Signed)
Physician Discharge Summary  [name]:[id number] DOB: 1961/01/12 DOA: 12/06/2022  PCP: Hayden Rasmussen, MD  Admit date: 12/06/2022 Discharge date: 12/08/2022  Admitted From: home Discharge disposition: home   Recommendations for Outpatient Follow-Up:   Supportive care for norovirus Staggered start of BP meds to avoid hypotension   Discharge Diagnosis:   Principal Problem:   Gastroenteritis Active Problems:   Essential hypertension   Paroxysmal atrial fibrillation (HCC)   Chronic heart failure with preserved ejection fraction (HFpEF) ([place])   Type 2 diabetes mellitus without complication, without long-term current use of insulin (HCC)   Sleep apnea   Depression   Morbid obesity with BMI of 60.0-69.9, adult [facility])    Discharge Condition: Improved.  Diet recommendation: Low sodium, heart healthy.  Carbohydrate-modified.  Wound care: None.  Code status: Full.   History of Present Illness:   [name] is a 61 y.o. female with medical history significant of afib; remote breast cancer; chronic diastolic CHF;  HTN; depression/anxiety; asthma; super morbid obesity; CAD; OSA; and DM presenting with syncope while vomiting. She reports that she started feeling very nauseated about 8-9pm.  She developed diarrhea and then vomiting.  About 1030, she realized she hadn't eaten or taken her dinner medications - but was too nauseated to do that.  She is not sure what time she last went to the bathroom but she passed out and her husband called 911.  EMS had her husband give chest compressions.  He turned her on her side and she vomited extensively.  She had diarrhea in the ER as recently as a little while ago, sample sent.  No sick contacts - she doesn't go out much.  She made a pot of goulash yesterday and ate it around 5pm; her husband ate it too and is feeling fine.     She went to the ER last Sunday.  Everything seemed fine but she stood up to get something off the  shelf and she was very weak.  Her BP was 121/- earlier in the day, dropped to 90/53.  She went to the kitchen but was too weak to lift a bowl.  She had to sit down and her vision got splotchy.  She felt like she was going to pass out.  She was not having GI symptoms then at all.  She has been checking her BP through the week, once a day (not more often due to anxiety), at the same time every day.  She hasn't really checked it since last Sunday.   Hospital Course by Problem:    Gastroenteritis due to norovirus with Associated severe dehydration and hypokalemia -improved    Morbid obesity with BMI of 60.0-69.9, adult (HCC) Sleep apnea on chronic CPAP -outpatient follow up   Type 2 diabetes mellitus without complication, without long-term current use of insulin ([place]) -resume home meds   Chronic heart failure with preserved ejection fraction (HFpEF) ([place]) -resume home meds over time   Paroxysmal atrial fibrillation ([place]) Currently rate controlled    Essential hypertension Current blood pressure controlled           Medical Consultants:      Discharge Exam:   Vitals:   12/08/22 0536 12/08/22 0912  BP: 138/81 131/71  Pulse: 75 74  Resp: 18 18  Temp: 99.1 F (37.3 C) 98.7 F (37.1 C)  SpO2: 100% 97%   Vitals:   12/07/22 2053 12/07/22 2129 12/08/22 0536 12/08/22 0912  BP: 133/80  138/81 131/71  Pulse: 81 88 75 74  Resp: '18  18 18  '$ Temp: 98.5 F (36.9 C)  99.1 F (37.3 C) 98.7 F (37.1 C)  TempSrc: Oral  Oral Oral  SpO2: 96% 97% 100% 97%  Weight:      Height:        General exam: Appears calm and comfortable.    The results of significant diagnostics from this hospitalization (including imaging, microbiology, ancillary and laboratory) are listed below for reference.     Procedures and Diagnostic Studies:   CT ABDOMEN PELVIS W CONTRAST  Result Date: 12/06/2022 CLINICAL DATA:  61 year old female with history of acute onset of nonlocalized abdominal pain.  EXAM: CT ABDOMEN AND PELVIS WITH CONTRAST TECHNIQUE: Multidetector CT imaging of the abdomen and pelvis was performed using the standard protocol following bolus administration of intravenous contrast. RADIATION DOSE REDUCTION: This exam was performed according to the departmental dose-optimization program which includes automated exposure control, adjustment of the mA and/or kV according to patient size and/or use of iterative reconstruction technique. CONTRAST:  134m OMNIPAQUE IOHEXOL 350 MG/ML SOLN COMPARISON:  CT of the abdomen and pelvis 07/31/2020. FINDINGS: Lower chest: Unremarkable. Hepatobiliary: No suspicious cystic or solid hepatic lesions. No intra or extrahepatic biliary ductal dilatation. Numerous calcified gallstones lying dependently in the gallbladder. Gallbladder is moderately distended. Gallbladder wall thickness appears normal. No pericholecystic fluid or surrounding inflammatory changes. Pancreas: No pancreatic mass. No pancreatic ductal dilatation. No pancreatic or peripancreatic fluid collections or inflammatory changes. Spleen: Unremarkable. Adrenals/Urinary Tract: Bilateral kidneys and bilateral adrenal glands are normal in appearance. No hydroureteronephrosis. Urinary bladder is nearly decompressed, but otherwise unremarkable in appearance. Stomach/Bowel: The appearance of the stomach is normal. There is no pathologic dilatation of small bowel or colon. The appendix is not confidently identified and may be surgically absent. Regardless, there are no inflammatory changes noted adjacent to the cecum to suggest the presence of an acute appendicitis at this time. Vascular/Lymphatic: Minimal atherosclerotic calcifications are noted in the abdominal aorta. No lymphadenopathy noted in the abdomen or pelvis. Reproductive: Uterus and ovaries are unremarkable in appearance. Other: No significant volume of ascites.  No pneumoperitoneum. Musculoskeletal: There are no aggressive appearing lytic or  blastic lesions noted in the visualized portions of the skeleton. IMPRESSION: 1. No acute findings are noted in the abdomen or pelvis to account for the patient's symptoms. 2. Cholelithiasis without evidence of acute cholecystitis. 3. Mild aortic atherosclerosis. Electronically Signed   By: DVinnie LangtonM.D.   On: 12/06/2022 05:12   CT HEAD WO CONTRAST (5MM)  Result Date: 12/06/2022 CLINICAL DATA:  61year old female with altered mental status. Abdominal pain. EXAM: CT HEAD WITHOUT CONTRAST TECHNIQUE: Contiguous axial images were obtained from the base of the skull through the vertex without intravenous contrast. RADIATION DOSE REDUCTION: This exam was performed according to the departmental dose-optimization program which includes automated exposure control, adjustment of the mA and/or kV according to patient size and/or use of iterative reconstruction technique. COMPARISON:  Head CT 11/29/2022. FINDINGS: Brain: Cavum septum pellucidum, normal variant. Stable cerebral volume. No midline shift, ventriculomegaly, mass effect, evidence of mass lesion, intracranial hemorrhage or evidence of cortically based acute infarction. Small and round left frontal lobe subcortical white matter hypodensity on series 3, image 24 is stable and nonspecific, with otherwise normal for age gray-white differentiation throughout the brain. This could be a small perivascular space (normal variant). Vascular: Mild Calcified atherosclerosis at the skull base. No suspicious intracranial vascular hyperdensity. Skull: No acute osseous abnormality  identified. Sinuses/Orbits: Visualized paranasal sinuses and mastoids are stable and well aerated. Other: No acute orbit or scalp soft tissue finding. Chronic left vertex scalp soft tissue scarring. IMPRESSION: No acute intracranial abnormality. Stable and essentially normal for age non contrast CT appearance of the brain. Electronically Signed   By: Genevie Ann M.D.   On: 12/06/2022 04:54      Labs:   Basic Metabolic Panel: Recent Labs  Lab 12/06/22 0123 12/07/22 0445 12/07/22 1619 12/08/22 0335  NA 135 138 136 137  K 3.4* 3.3* 3.7 3.5  CL 98 102 101 101  CO2 '23 26 25 26  '$ GLUCOSE 174* 107* 99 105*  BUN '15 12 9 '$ 7*  CREATININE 0.94 0.70 0.76 0.73  CALCIUM 8.8* 8.0* 8.5* 8.5*   GFR Estimated Creatinine Clearance: 117 mL/min (by C-G formula based on SCr of 0.73 mg/dL). Liver Function Tests: Recent Labs  Lab 12/06/22 0123  AST 36  ALT 29  ALKPHOS 57  BILITOT 0.6  PROT 6.7  ALBUMIN 3.1*   Recent Labs  Lab 12/06/22 0123  LIPASE 33   No results for input(s): "AMMONIA" in the last 168 hours. Coagulation profile No results for input(s): "INR", "PROTIME" in the last 168 hours.  CBC: Recent Labs  Lab 12/06/22 0123 12/07/22 0445 12/08/22 0335  WBC 21.5* 6.6 7.3  NEUTROABS 19.7*  --   --   HGB 11.7* 10.7* 10.3*  HCT 35.5* 31.8* 30.5*  MCV 88.5 86.6 86.9  PLT 316 259 215   Cardiac Enzymes: No results for input(s): "CKTOTAL", "CKMB", "CKMBINDEX", "TROPONINI" in the last 168 hours. BNP: Invalid input(s): "POCBNP" CBG: Recent Labs  Lab 12/07/22 0855 12/07/22 1214 12/07/22 1634 12/07/22 2054 12/08/22 0909  GLUCAP 117* 90 90 116* 111*   D-Dimer No results for input(s): "DDIMER" in the last 72 hours. Hgb A1c Recent Labs    12/06/22 0123  HGBA1C 6.2*   Lipid Profile No results for input(s): "CHOL", "HDL", "LDLCALC", "TRIG", "CHOLHDL", "LDLDIRECT" in the last 72 hours. Thyroid function studies No results for input(s): "TSH", "T4TOTAL", "T3FREE", "THYROIDAB" in the last 72 hours.  Invalid input(s): "FREET3" Anemia work up No results for input(s): "VITAMINB12", "FOLATE", "FERRITIN", "TIBC", "IRON", "RETICCTPCT" in the last 72 hours. Microbiology Recent Results (from the past 240 hour(s))  Resp panel by RT-PCR (RSV, Flu A&B, Covid) Anterior Nasal Swab     Status: None   Collection Time: 11/29/22  6:27 PM   Specimen: Anterior Nasal Swab   Result Value Ref Range Status   SARS Coronavirus 2 by RT PCR NEGATIVE NEGATIVE Final    Comment: (NOTE) SARS-CoV-2 target nucleic acids are NOT DETECTED.  The SARS-CoV-2 RNA is generally detectable in upper respiratory specimens during the acute phase of infection. The lowest concentration of SARS-CoV-2 viral copies this assay can detect is 138 copies/mL. A negative result does not preclude SARS-Cov-2 infection and should not be used as the sole basis for treatment or other patient management decisions. A negative result may occur with  improper specimen collection/handling, submission of specimen other than nasopharyngeal swab, presence of viral mutation(s) within the areas targeted by this assay, and inadequate number of viral copies(<138 copies/mL). A negative result must be combined with clinical observations, patient history, and epidemiological information. The expected result is Negative.  Fact Sheet for Patients:  EntrepreneurPulse.com.au  Fact Sheet for Healthcare Providers:  IncredibleEmployment.be  This test is no t yet approved or cleared by the [place] FDA and  has been authorized for detection and/or diagnosis  of SARS-CoV-2 by FDA under an Emergency Use Authorization (EUA). This EUA will remain  in effect (meaning this test can be used) for the duration of the COVID-19 declaration under Section 564(b)(1) of the Act, 21 U.S.C.section 360bbb-3(b)(1), unless the authorization is terminated  or revoked sooner.       Influenza A by PCR NEGATIVE NEGATIVE Final   Influenza B by PCR NEGATIVE NEGATIVE Final    Comment: (NOTE) The Xpert Xpress SARS-CoV-2/FLU/RSV plus assay is intended as an aid in the diagnosis of influenza from Nasopharyngeal swab specimens and should not be used as a sole basis for treatment. Nasal washings and aspirates are unacceptable for Xpert Xpress SARS-CoV-2/FLU/RSV testing.  Fact Sheet for  Patients: EntrepreneurPulse.com.au  Fact Sheet for Healthcare Providers: IncredibleEmployment.be  This test is not yet approved or cleared by the [place] FDA and has been authorized for detection and/or diagnosis of SARS-CoV-2 by FDA under an Emergency Use Authorization (EUA). This EUA will remain in effect (meaning this test can be used) for the duration of the COVID-19 declaration under Section 564(b)(1) of the Act, 21 U.S.C. section 360bbb-3(b)(1), unless the authorization is terminated or revoked.     Resp Syncytial Virus by PCR NEGATIVE NEGATIVE Final    Comment: (NOTE) Fact Sheet for Patients: EntrepreneurPulse.com.au  Fact Sheet for Healthcare Providers: IncredibleEmployment.be  This test is not yet approved or cleared by the [place] FDA and has been authorized for detection and/or diagnosis of SARS-CoV-2 by FDA under an Emergency Use Authorization (EUA). This EUA will remain in effect (meaning this test can be used) for the duration of the COVID-19 declaration under Section 564(b)(1) of the Act, 21 U.S.C. section 360bbb-3(b)(1), unless the authorization is terminated or revoked.  Performed at [facility], [address] [place], [place] [address]   Resp panel by RT-PCR (RSV, Flu A&B, Covid) Anterior Nasal Swab     Status: None   Collection Time: 12/06/22  4:55 AM   Specimen: Anterior Nasal Swab  Result Value Ref Range Status   SARS Coronavirus 2 by RT PCR NEGATIVE NEGATIVE Final    Comment: (NOTE) SARS-CoV-2 target nucleic acids are NOT DETECTED.  The SARS-CoV-2 RNA is generally detectable in upper respiratory specimens during the acute phase of infection. The lowest concentration of SARS-CoV-2 viral copies this assay can detect is 138 copies/mL. A negative result does not preclude SARS-Cov-2 infection and should not be used as the sole basis for treatment or other  patient management decisions. A negative result may occur with  improper specimen collection/handling, submission of specimen other than nasopharyngeal swab, presence of viral mutation(s) within the areas targeted by this assay, and inadequate number of viral copies(<138 copies/mL). A negative result must be combined with clinical observations, patient history, and epidemiological information. The expected result is Negative.  Fact Sheet for Patients:  EntrepreneurPulse.com.au  Fact Sheet for Healthcare Providers:  IncredibleEmployment.be  This test is no t yet approved or cleared by the [place] FDA and  has been authorized for detection and/or diagnosis of SARS-CoV-2 by FDA under an Emergency Use Authorization (EUA). This EUA will remain  in effect (meaning this test can be used) for the duration of the COVID-19 declaration under Section 564(b)(1) of the Act, 21 U.S.C.section 360bbb-3(b)(1), unless the authorization is terminated  or revoked sooner.       Influenza A by PCR NEGATIVE NEGATIVE Final   Influenza B by PCR NEGATIVE NEGATIVE Final    Comment: (NOTE) The Xpert Xpress SARS-CoV-2/FLU/RSV plus  assay is intended as an aid in the diagnosis of influenza from Nasopharyngeal swab specimens and should not be used as a sole basis for treatment. Nasal washings and aspirates are unacceptable for Xpert Xpress SARS-CoV-2/FLU/RSV testing.  Fact Sheet for Patients: EntrepreneurPulse.com.au  Fact Sheet for Healthcare Providers: IncredibleEmployment.be  This test is not yet approved or cleared by the [place] FDA and has been authorized for detection and/or diagnosis of SARS-CoV-2 by FDA under an Emergency Use Authorization (EUA). This EUA will remain in effect (meaning this test can be used) for the duration of the COVID-19 declaration under Section 564(b)(1) of the Act, 21 U.S.C. section  360bbb-3(b)(1), unless the authorization is terminated or revoked.     Resp Syncytial Virus by PCR NEGATIVE NEGATIVE Final    Comment: (NOTE) Fact Sheet for Patients: EntrepreneurPulse.com.au  Fact Sheet for Healthcare Providers: IncredibleEmployment.be  This test is not yet approved or cleared by the [place] FDA and has been authorized for detection and/or diagnosis of SARS-CoV-2 by FDA under an Emergency Use Authorization (EUA). This EUA will remain in effect (meaning this test can be used) for the duration of the COVID-19 declaration under Section 564(b)(1) of the Act, 21 U.S.C. section 360bbb-3(b)(1), unless the authorization is terminated or revoked.  Performed at [facility], [place] [address] [place], [place] [address]   C Difficile Quick Screen w PCR reflex     Status: None   Collection Time: 12/06/22  7:51 AM   Specimen: STOOL  Result Value Ref Range Status   C Diff antigen NEGATIVE NEGATIVE Final   C Diff toxin NEGATIVE NEGATIVE Final   C Diff interpretation No C. difficile detected.  Final    Comment: Performed at [facility], [place] [address] [place], [place] [address]  Gastrointestinal Panel by PCR , Stool     Status: Abnormal   Collection Time: 12/06/22  7:51 AM   Specimen: STOOL  Result Value Ref Range Status   Campylobacter species NOT DETECTED NOT DETECTED Final   Plesimonas shigelloides NOT DETECTED NOT DETECTED Final   Salmonella species NOT DETECTED NOT DETECTED Final   Yersinia enterocolitica NOT DETECTED NOT DETECTED Final   Vibrio species NOT DETECTED NOT DETECTED Final   Vibrio cholerae NOT DETECTED NOT DETECTED Final   Enteroaggregative E coli (EAEC) NOT DETECTED NOT DETECTED Final   Enteropathogenic E coli (EPEC) NOT DETECTED NOT DETECTED Final   Enterotoxigenic E coli (ETEC) NOT DETECTED NOT DETECTED Final   Shiga like toxin producing E coli (STEC) NOT DETECTED NOT DETECTED Final    Shigella/Enteroinvasive E coli (EIEC) NOT DETECTED NOT DETECTED Final   Cryptosporidium NOT DETECTED NOT DETECTED Final   Cyclospora cayetanensis NOT DETECTED NOT DETECTED Final   Entamoeba histolytica NOT DETECTED NOT DETECTED Final   Giardia lamblia NOT DETECTED NOT DETECTED Final   Adenovirus F40/41 NOT DETECTED NOT DETECTED Final   Astrovirus NOT DETECTED NOT DETECTED Final   Norovirus GI/GII DETECTED (A) NOT DETECTED Final    Comment: RESULT CALLED TO, READ BACK BY AND VERIFIED WITH: SAVANNAH SMALLWOOD 12/07/22 1320 MW    Rotavirus A NOT DETECTED NOT DETECTED Final   Sapovirus (I, II, IV, and V) NOT DETECTED NOT DETECTED Final    Comment: Performed at [facility], [address] [place], [place] [address]     Discharge Instructions:   Discharge Instructions     Diet - low sodium heart healthy   Complete by: As directed    Diet Carb  Modified   Complete by: As directed    Discharge instructions   Complete by: As directed    I am resuming your BP medications in a staggered approach.  If your BP is consistently high restart your meds sooner.  If you are still having diarrhea hold your lasix until improved   Increase activity slowly   Complete by: As directed       Allergies as of 12/08/2022       Reactions   Erythromycin Diarrhea        Medication List     STOP taking these medications    metoprolol tartrate 100 MG tablet Commonly known as: LOPRESSOR       TAKE these medications    albuterol 108 (90 Base) MCG/ACT inhaler Commonly known as: VENTOLIN HFA Inhale 2 puffs into the lungs every 6 (six) hours as needed for wheezing or shortness of breath.   apixaban 5 MG Tabs tablet Commonly known as: ELIQUIS Take 1 tablet (5 mg total) by mouth every 12 (twelve) hours.   Cholecalciferol 125 MCG (5000 UT) Chew Chew 5,000 Units by mouth daily.   clonazePAM 0.5 MG tablet Commonly known as: KLONOPIN Take 0.5 mg by mouth 2 (two) times daily.    fexofenadine 180 MG tablet Commonly known as: ALLEGRA Take 180 mg by mouth daily.   flecainide 100 MG tablet Commonly known as: TAMBOCOR Take 1 tablet (100 mg total) by mouth 2 (two) times daily.   furosemide 40 MG tablet Commonly known as: LASIX Take 1 tablet (40 mg total) by mouth daily for 1 day.   lamoTRIgine 100 MG tablet Commonly known as: LAMICTAL Take 150 mg by mouth every 12 (twelve) hours.   levothyroxine 50 MCG tablet Commonly known as: SYNTHROID Take 50 mcg by mouth daily before breakfast.   lisinopril 40 MG tablet Commonly known as: ZESTRIL Take 1 tablet (40 mg total) by mouth daily. Start taking on: December 14, 2022 What changed: These instructions start on December 14, 2022. If you are unsure what to do until then, ask your doctor or other care provider.   metFORMIN 500 MG tablet Commonly known as: GLUCOPHAGE Take 1,000 mg by mouth 2 (two) times daily.   montelukast 10 MG tablet Commonly known as: SINGULAIR Take 10 mg by mouth at bedtime.   omeprazole 20 MG capsule Commonly known as: PRILOSEC Take 20 mg by mouth every 12 (twelve) hours.   PATADAY OP Place 1 drop into both eyes daily.   prenatal multivitamin Tabs tablet Take 1 tablet by mouth daily. With iron   verapamil 180 MG CR tablet Commonly known as: CALAN-SR Take 2 tablets (360 mg total) by mouth daily. Start taking on: December 09, 2022          Time coordinating discharge: 45 min  Signed:  Geradine Girt DO  [facility] 12/08/2022, 9:22 AM

## 2022-12-08 NOTE — TOC Transition Note (Signed)
Transition of Care [facility]) - CM/SW Discharge Note   Patient Details  Name: [name] MRN: [id number] Date of Birth: Jul 31, 1961  [facility] CM/SW Contact:  Tom-Johnson, Renea Ee, RN Phone Number: 12/08/2022, 9:45 AM   Clinical Narrative:     Patient is scheduled  for discharge today. No PT/OT f/u noted. No TOC needs or recommendations noted. Family to transport at discharge. No further TOC needs noted.     Final next level of care: Home/Self Care Barriers to Discharge: Barriers Resolved   Patient Goals and CMS Choice Patient states their goals for this hospitalization and ongoing recovery are:: To return home CMS Medicare.gov Compare Post Acute Care list provided to:: Patient Choice offered to / list presented to : NA  Discharge Placement                Patient to be transferred to facility by: Family      Discharge Plan and Services                DME Arranged: N/A DME Agency: NA       HH Arranged: NA HH Agency: NA        Social Determinants of Health (SDOH) Interventions Transportation Interventions: Intervention Not Indicated, Inpatient TOC, Patient Resources (Friends/Family)   Readmission Risk Interventions     No data to display

## 2022-12-08 NOTE — Progress Notes (Signed)
DISCHARGE NOTE HOME MANJU KULKARNI to be discharged Home per MD order. Discussed prescriptions and follow up appointments with the patient. Prescriptions given to patient; medication list explained in detail. Patient verbalized understanding.  Skin clean, dry and intact without evidence of skin break down, no evidence of skin tears noted. IV catheter discontinued intact. Site without signs and symptoms of complications. Dressing and pressure applied. Pt denies pain at the site currently. No complaints noted.  Patient free of lines, drains, and wounds.   An After Visit Summary (AVS) was printed and given to the patient. Patient escorted via wheelchair, and discharged home via private auto.  Keenan Bachelor, [place] NOTE HOME NAVEH RICKLES to be discharged Home per MD order. Discussed prescriptions and follow up appointments with the patient. Prescriptions given to patient; medication list explained in detail. Patient verbalized understanding.  Skin clean, dry and intact without evidence of skin break down, no evidence of skin tears noted. IV catheter discontinued intact. Site without signs and symptoms of complications. Dressing and pressure applied. Pt denies pain at the site currently. No complaints noted.  Patient free of lines, drains, and wounds.   An After Visit Summary (AVS) was printed and given to the patient. Patient escorted via wheelchair, and discharged home via private auto.  Keenan Bachelor, RN

## 2022-12-09 ENCOUNTER — Encounter: Payer: Self-pay | Admitting: Internal Medicine

## 2022-12-23 ENCOUNTER — Encounter: Payer: 59 | Attending: Family Medicine | Admitting: Dietician

## 2022-12-23 DIAGNOSIS — R7303 Prediabetes: Secondary | ICD-10-CM | POA: Insufficient documentation

## 2022-12-23 DIAGNOSIS — Z713 Dietary counseling and surveillance: Secondary | ICD-10-CM | POA: Insufficient documentation

## 2022-12-23 NOTE — Progress Notes (Signed)
Medical Nutrition Therapy  Appointment Start time:  [phone]  Appointment End time:  [phone]  Primary concerns today: Pt states because of recent heart concerns including CHF and atrial fibrillation, and HTN she wants to learn how to have a more nutritious low sodium diet.   Referral diagnosis: E66.01 Preferred learning style: no preference indicated Learning readiness: ready   NUTRITION ASSESSMENT   Anthropometrics  Ht: 65in Wt: not assessed  Clinical Medical Hx: anxiety, asthma, depression, prediabetes, GERD, HTN, sleep apnea, CHF.  Medications: metformin, levothyroxine Labs: 12/08/22: hemoglobin 10.3, 12/06/22: A1c 6.2%,  Notable Signs/Symptoms: none indicated Food Allergies: none  Lifestyle & Dietary Hx Pt is present today with her husband. Pt reports her husband helps her with a lot of duties around the house.  Pt states she has been having a hard time standing for long periods of time so cooking has been difficult.   Pt states she has put herself on a low sodium diet since her concerns of CHF. She has been label reading and choosing low-sodium food options.   Pt states she was recently at the hospital for norovirus after passing out at home and breaking her ribs.   Pt states her doctor wants her to exercise on a recumbent bike or water aerobics does not want her heart rate above 100.   Pt wants to be able to walk comfortably in the future.   Estimated daily fluid intake: not assessed Supplements: iron, prenatal, vitamin D.  Sleep: 12am-8am, interrupted, wears C-pap. Stress / self-care: moderate stress Current average weekly physical activity: ADLs  24-Hr Dietary Recall First Meal: 10:30am: leftover spaghetti OR chicken casserole OR egg sandwich with [place] bacon OR omelette with [place] bacon and cheese OR 3 slices cheese toast (daves bread) Snack: none OR fruit OR homemade trail mix with unsalted nuts, pretzels, craisins, dark chocolate m&m's Second Meal: leftovers:  spaghetti, chicken casserole OR restaurant [employer] with vegetables OR cookout grilled chicken sandwich with cole slaw and unsalted fries OR [place] and cheese sandwich with unsalted chips Snack: none OR fruit OR homemade trail mix  Third Meal: chopped chicken sandwich and cole slaw OR zaxbys salad OR cookout grilled chicken sandwich with fries and cole slaw.  Snack: none Beverages: water, sparkling ice flavored water, body armor, ginger ale or diet dr pepper.    NUTRITION DIAGNOSIS  [place]-2.2 Altered nutrition-related laboratory As related to prediabetes.  As evidenced by A1c 6.2%.   NUTRITION INTERVENTION  Nutrition education (E-1) on the following topics:  Saturated vs unsaturated fat Whole vs refined grains Building balanced meals and snacks Plant based meal options Importance of adequate sleep Impact of physical and mental stress on blood glucose Benefits of physical activity and goals Low sodium options Reducing processed food intake  Handouts Provided Include  My Placemat for Diabetes Snack Sheet  Learning Style & Readiness for Change Teaching method utilized: Visual & Auditory  Demonstrated degree of understanding via: Teach Back  Barriers to learning/adherence to lifestyle change: none  Goals Established by Pt Goal: I will do a chair workout for 10 minutes 3x/wk. Goal: I will make 1/2 of my plate vegetables at least once per day.    MONITORING & EVALUATION Dietary intake, weekly physical activity, and follow up in 3 months.  Next Steps  Patient is to call for questions.

## 2022-12-24 ENCOUNTER — Ambulatory Visit: Payer: 59 | Admitting: Internal Medicine

## 2022-12-24 DIAGNOSIS — E039 Hypothyroidism, unspecified: Secondary | ICD-10-CM | POA: Diagnosis not present

## 2022-12-24 DIAGNOSIS — I10 Essential (primary) hypertension: Secondary | ICD-10-CM | POA: Diagnosis not present

## 2022-12-24 DIAGNOSIS — E559 Vitamin D deficiency, unspecified: Secondary | ICD-10-CM | POA: Diagnosis not present

## 2022-12-24 DIAGNOSIS — R69 Illness, unspecified: Secondary | ICD-10-CM | POA: Diagnosis not present

## 2022-12-24 DIAGNOSIS — R600 Localized edema: Secondary | ICD-10-CM | POA: Diagnosis not present

## 2022-12-30 ENCOUNTER — Encounter: Payer: 59 | Admitting: Dietician

## 2023-01-01 ENCOUNTER — Telehealth (HOSPITAL_COMMUNITY): Payer: Self-pay | Admitting: Emergency Medicine

## 2023-01-01 NOTE — Telephone Encounter (Signed)
Reaching out to patient to offer assistance regarding upcoming cardiac imaging study; pt verbalizes understanding of appt date/time, parking situation and where to check in, pre-test NPO status and medications ordered, and verified current allergies; name and call back number provided for further questions should they arise Marchia Bond RN Navigator Cardiac Imaging [facility] [phone] office [phone] cell   Arrival 1230  [facility]  R arm restriction '100mg'$  metoprolol tart  Holding lasix

## 2023-01-04 ENCOUNTER — Inpatient Hospital Stay
Admission: EM | Admit: 2023-01-04 | Discharge: 2023-01-08 | DRG: 917 | Disposition: A | Discharge status: Home / Self Care | Payer: 59 | Attending: Family Medicine | Admitting: Family Medicine

## 2023-01-04 ENCOUNTER — Other Ambulatory Visit: Payer: Self-pay

## 2023-01-04 ENCOUNTER — Ambulatory Visit
Admission: RE | Admit: 2023-01-04 | Discharge: 2023-01-04 | Disposition: A | Discharge status: Home / Self Care | Payer: 59 | Source: Ambulatory Visit | Attending: Nurse Practitioner | Admitting: Nurse Practitioner

## 2023-01-04 ENCOUNTER — Inpatient Hospital Stay: Payer: 59

## 2023-01-04 ENCOUNTER — Encounter: Payer: Self-pay | Admitting: Pulmonary Disease

## 2023-01-04 DIAGNOSIS — F419 Anxiety disorder, unspecified: Secondary | ICD-10-CM | POA: Diagnosis present

## 2023-01-04 DIAGNOSIS — R32 Unspecified urinary incontinence: Secondary | ICD-10-CM | POA: Diagnosis present

## 2023-01-04 DIAGNOSIS — Z79890 Hormone replacement therapy (postmenopausal): Secondary | ICD-10-CM

## 2023-01-04 DIAGNOSIS — R001 Bradycardia, unspecified: Secondary | ICD-10-CM | POA: Diagnosis present

## 2023-01-04 DIAGNOSIS — R579 Shock, unspecified: Secondary | ICD-10-CM | POA: Diagnosis not present

## 2023-01-04 DIAGNOSIS — K219 Gastro-esophageal reflux disease without esophagitis: Secondary | ICD-10-CM | POA: Diagnosis present

## 2023-01-04 DIAGNOSIS — R570 Cardiogenic shock: Secondary | ICD-10-CM | POA: Diagnosis not present

## 2023-01-04 DIAGNOSIS — Z853 Personal history of malignant neoplasm of breast: Secondary | ICD-10-CM

## 2023-01-04 DIAGNOSIS — R072 Precordial pain: Secondary | ICD-10-CM | POA: Insufficient documentation

## 2023-01-04 DIAGNOSIS — Z87891 Personal history of nicotine dependence: Secondary | ICD-10-CM | POA: Diagnosis not present

## 2023-01-04 DIAGNOSIS — I480 Paroxysmal atrial fibrillation: Secondary | ICD-10-CM | POA: Diagnosis not present

## 2023-01-04 DIAGNOSIS — Z9011 Acquired absence of right breast and nipple: Secondary | ICD-10-CM | POA: Diagnosis not present

## 2023-01-04 DIAGNOSIS — Z888 Allergy status to other drugs, medicaments and biological substances status: Secondary | ICD-10-CM

## 2023-01-04 DIAGNOSIS — I952 Hypotension due to drugs: Secondary | ICD-10-CM | POA: Diagnosis not present

## 2023-01-04 DIAGNOSIS — I2510 Atherosclerotic heart disease of native coronary artery without angina pectoris: Secondary | ICD-10-CM | POA: Diagnosis present

## 2023-01-04 DIAGNOSIS — I5032 Chronic diastolic (congestive) heart failure: Secondary | ICD-10-CM | POA: Diagnosis present

## 2023-01-04 DIAGNOSIS — G4733 Obstructive sleep apnea (adult) (pediatric): Secondary | ICD-10-CM | POA: Diagnosis not present

## 2023-01-04 DIAGNOSIS — Z8249 Family history of ischemic heart disease and other diseases of the circulatory system: Secondary | ICD-10-CM | POA: Diagnosis not present

## 2023-01-04 DIAGNOSIS — Z6841 Body Mass Index (BMI) 40.0 and over, adult: Secondary | ICD-10-CM

## 2023-01-04 DIAGNOSIS — Z95810 Presence of automatic (implantable) cardiac defibrillator: Secondary | ICD-10-CM | POA: Diagnosis not present

## 2023-01-04 DIAGNOSIS — J4530 Mild persistent asthma, uncomplicated: Secondary | ICD-10-CM | POA: Diagnosis present

## 2023-01-04 DIAGNOSIS — Z66 Do not resuscitate: Secondary | ICD-10-CM | POA: Diagnosis present

## 2023-01-04 DIAGNOSIS — Z7984 Long term (current) use of oral hypoglycemic drugs: Secondary | ICD-10-CM | POA: Diagnosis not present

## 2023-01-04 DIAGNOSIS — E119 Type 2 diabetes mellitus without complications: Secondary | ICD-10-CM | POA: Diagnosis present

## 2023-01-04 DIAGNOSIS — T447X1A Poisoning by beta-adrenoreceptor antagonists, accidental (unintentional), initial encounter: Principal | ICD-10-CM

## 2023-01-04 DIAGNOSIS — Z79899 Other long term (current) drug therapy: Secondary | ICD-10-CM

## 2023-01-04 DIAGNOSIS — Y929 Unspecified place or not applicable: Secondary | ICD-10-CM | POA: Diagnosis not present

## 2023-01-04 DIAGNOSIS — Z7901 Long term (current) use of anticoagulants: Secondary | ICD-10-CM

## 2023-01-04 DIAGNOSIS — Z452 Encounter for adjustment and management of vascular access device: Secondary | ICD-10-CM | POA: Diagnosis not present

## 2023-01-04 DIAGNOSIS — Z1152 Encounter for screening for COVID-19: Secondary | ICD-10-CM

## 2023-01-04 DIAGNOSIS — I4891 Unspecified atrial fibrillation: Secondary | ICD-10-CM | POA: Diagnosis not present

## 2023-01-04 DIAGNOSIS — I4819 Other persistent atrial fibrillation: Secondary | ICD-10-CM | POA: Diagnosis not present

## 2023-01-04 DIAGNOSIS — I959 Hypotension, unspecified: Secondary | ICD-10-CM | POA: Diagnosis not present

## 2023-01-04 DIAGNOSIS — I517 Cardiomegaly: Secondary | ICD-10-CM | POA: Diagnosis not present

## 2023-01-04 DIAGNOSIS — I110 Hypertensive heart disease with heart failure: Secondary | ICD-10-CM | POA: Diagnosis present

## 2023-01-04 LAB — BASIC METABOLIC PANEL
Anion gap: 7 (ref 5–15)
BUN: 13 mg/dL (ref 8–23)
CO2: 25 mmol/L (ref 22–32)
Calcium: 8.3 mg/dL — ABNORMAL LOW (ref 8.9–10.3)
Chloride: 101 mmol/L (ref 98–111)
Creatinine, Ser: 0.67 mg/dL (ref 0.44–1.00)
GFR, Estimated: >60 mL/min (ref >60)
Glucose, Bld: 118 mg/dL — ABNORMAL HIGH (ref 70–99)
Potassium: 4.2 mmol/L (ref 3.5–5.1)
Sodium: 133 mmol/L — ABNORMAL LOW (ref 135–145)

## 2023-01-04 LAB — CBC WITH DIFFERENTIAL/PLATELET
Abs Immature Granulocytes: 0.11 10*3/uL — ABNORMAL HIGH (ref 0.00–0.07)
Basophils Absolute: 0.1 10*3/uL (ref 0.0–0.1)
Basophils Relative: 1 %
Eosinophils Absolute: 0.2 10*3/uL (ref 0.0–0.5)
Eosinophils Relative: 1 %
HCT: 31 % — ABNORMAL LOW (ref 36.0–46.0)
Hemoglobin: 10 g/dL — ABNORMAL LOW (ref 12.0–15.0)
Immature Granulocytes: 1 %
Lymphocytes Relative: 21 %
Lymphs Abs: 2.3 10*3/uL (ref 0.7–4.0)
MCH: 29.3 pg (ref 26.0–34.0)
MCHC: 32.3 g/dL (ref 30.0–36.0)
MCV: 90.9 fL (ref 80.0–100.0)
Monocytes Absolute: 0.8 10*3/uL (ref 0.1–1.0)
Monocytes Relative: 8 %
Neutro Abs: 7.7 10*3/uL (ref 1.7–7.7)
Neutrophils Relative %: 68 %
Platelets: 284 10*3/uL (ref 150–400)
RBC: 3.41 MIL/uL — ABNORMAL LOW (ref 3.87–5.11)
RDW: 15.7 % — ABNORMAL HIGH (ref 11.5–15.5)
WBC: 11.1 10*3/uL — ABNORMAL HIGH (ref 4.0–10.5)
nRBC: 0 % (ref 0.0–0.2)

## 2023-01-04 LAB — GLUCOSE, CAPILLARY
Glucose-Capillary: 274 mg/dL — ABNORMAL HIGH (ref 70–99)
Glucose-Capillary: 323 mg/dL — ABNORMAL HIGH (ref 70–99)
Glucose-Capillary: 286 mg/dL — ABNORMAL HIGH (ref 70–99)

## 2023-01-04 LAB — TSH: TSH: 2.71 u[IU]/mL (ref 0.350–4.500)

## 2023-01-04 LAB — TROPONIN I (HIGH SENSITIVITY)
Troponin I (High Sensitivity): 4 ng/L (ref <18)
Troponin I (High Sensitivity): 4 ng/L (ref <18)

## 2023-01-04 LAB — MAGNESIUM: Magnesium: 1.9 mg/dL (ref 1.7–2.4)

## 2023-01-04 LAB — MRSA NEXT GEN BY PCR, NASAL: MRSA by PCR Next Gen: NOT DETECTED

## 2023-01-04 LAB — PHOSPHORUS: Phosphorus: 3.5 mg/dL (ref 2.5–4.6)

## 2023-01-04 LAB — D-DIMER, QUANTITATIVE: D-Dimer, Quant: 0.35 ug/mL-FEU (ref 0.00–0.50)

## 2023-01-04 MED ORDER — SODIUM CHLORIDE 0.9 % IV SOLN
250.0000 mL | INTRAVENOUS | Status: DC
  Administered 2023-01-04 – 2023-01-06 (×2): 250 mL via INTRAVENOUS

## 2023-01-04 MED ORDER — NITROGLYCERIN 0.4 MG SL SUBL: 0.8000 mg | SUBLINGUAL_TABLET | SUBLINGUAL | Status: DC | PRN

## 2023-01-04 MED ORDER — ACETAMINOPHEN 325 MG PO TABS
650.0000 mg | ORAL_TABLET | ORAL | Status: DC | PRN
  Administered 2023-01-06 – 2023-01-08 (×2): 650 mg via ORAL
  Filled 2023-01-04 (×2): qty 2

## 2023-01-04 MED ORDER — ALBUTEROL SULFATE (2.5 MG/3ML) 0.083% IN NEBU: 3.0000 mL | INHALATION_SOLUTION | Freq: Four times a day (QID) | RESPIRATORY_TRACT | Status: DC | PRN

## 2023-01-04 MED ORDER — NOREPINEPHRINE 4 MG/250ML-% IV SOLN
2.0000 ug/min | INTRAVENOUS | Status: DC
  Administered 2023-01-04: 2 ug/min via INTRAVENOUS
  Filled 2023-01-04: qty 250

## 2023-01-04 MED ORDER — ATROPINE SULFATE 1 MG/10ML IJ SOSY
PREFILLED_SYRINGE | INTRAMUSCULAR | Status: AC
  Administered 2023-01-04: 1 mg
  Filled 2023-01-04: qty 10

## 2023-01-04 MED ORDER — GLUCAGON HCL RDNA (DIAGNOSTIC) 1 MG IJ SOLR
5.0000 mg/h | INTRAVENOUS | Status: DC
  Administered 2023-01-04 (×4): 5 mg/h via INTRAVENOUS
  Filled 2023-01-04 (×7): qty 5

## 2023-01-04 MED ORDER — GLUCAGON HCL RDNA (DIAGNOSTIC) 1 MG IJ SOLR
5.0000 mg/h | INTRAVENOUS | Status: DC
  Administered 2023-01-04 – 2023-01-05 (×3): 5 mg/h via INTRAVENOUS
  Filled 2023-01-04 (×3): qty 15

## 2023-01-04 MED ORDER — GLUCAGON HCL RDNA (DIAGNOSTIC) 1 MG IJ SOLR
5.0000 mg | Freq: Once | INTRAVENOUS | Status: AC
  Administered 2023-01-04: 5 mg via INTRAVENOUS
  Filled 2023-01-04: qty 5

## 2023-01-04 MED ORDER — DOCUSATE SODIUM 100 MG PO CAPS: 100.0000 mg | ORAL_CAPSULE | Freq: Two times a day (BID) | ORAL | Status: DC | PRN

## 2023-01-04 MED ORDER — IOHEXOL 350 MG/ML SOLN
125.0000 mL | Freq: Once | INTRAVENOUS | Status: AC | PRN
  Administered 2023-01-04: 100 mL via INTRAVENOUS

## 2023-01-04 MED ORDER — ENOXAPARIN SODIUM 100 MG/ML IJ SOSY
0.5000 mg/kg | PREFILLED_SYRINGE | INTRAMUSCULAR | Status: DC
  Administered 2023-01-04: 82.5 mg via SUBCUTANEOUS
  Filled 2023-01-04: qty 0.82

## 2023-01-04 MED ORDER — EPINEPHRINE 1 MG/10ML IJ SOSY
PREFILLED_SYRINGE | INTRAMUSCULAR | Status: AC
  Filled 2023-01-04: qty 10

## 2023-01-04 MED ORDER — EPINEPHRINE HCL 5 MG/250ML IV SOLN IN NS
0.5000 ug/min | INTRAVENOUS | Status: DC
  Administered 2023-01-04: 0.5 ug/min via INTRAVENOUS
  Filled 2023-01-04: qty 250

## 2023-01-04 MED ORDER — NOREPINEPHRINE 16 MG/250ML-% IV SOLN
0.0000 ug/min | INTRAVENOUS | Status: DC
  Administered 2023-01-04: 4 ug/min via INTRAVENOUS
  Filled 2023-01-04: qty 250

## 2023-01-04 MED ORDER — EPINEPHRINE 1 MG/10ML IJ SOSY
PREFILLED_SYRINGE | INTRAMUSCULAR | Status: AC
  Administered 2023-01-04: 1 mg
  Filled 2023-01-04: qty 10

## 2023-01-04 MED ORDER — POLYETHYLENE GLYCOL 3350 17 G PO PACK: 17.0000 g | PACK | Freq: Every day | ORAL | Status: DC | PRN

## 2023-01-04 MED ORDER — GLUCAGON HCL RDNA (DIAGNOSTIC) 1 MG IJ SOLR: 5.0000 mg | Freq: Once | INTRAVENOUS | Status: DC

## 2023-01-04 MED ORDER — INSULIN ASPART 100 UNIT/ML IJ SOLN
0.0000 [IU] | INTRAMUSCULAR | Status: DC
  Administered 2023-01-04: 8 [IU] via SUBCUTANEOUS
  Administered 2023-01-04: 11 [IU] via SUBCUTANEOUS
  Administered 2023-01-05: 3 [IU] via SUBCUTANEOUS
  Administered 2023-01-06: 2 [IU] via SUBCUTANEOUS
  Administered 2023-01-06: 3 [IU] via SUBCUTANEOUS
  Administered 2023-01-06 – 2023-01-08 (×4): 2 [IU] via SUBCUTANEOUS
  Filled 2023-01-04 (×11): qty 1

## 2023-01-04 MED ORDER — GLUCAGON HCL RDNA (DIAGNOSTIC) 1 MG IJ SOLR
INTRAMUSCULAR | Status: AC
  Filled 2023-01-04: qty 1

## 2023-01-04 MED ORDER — EPINEPHRINE 1 MG/10ML IJ SOSY
1.0000 mg | PREFILLED_SYRINGE | Freq: Once | INTRAMUSCULAR | Status: AC
  Administered 2023-01-04: 1 mg via INTRAVENOUS

## 2023-01-04 MED ORDER — METOPROLOL TARTRATE 5 MG/5ML IV SOLN
10.0000 mg | Freq: Once | INTRAVENOUS | Status: DC
  Filled 2023-01-04: qty 10

## 2023-01-04 NOTE — ED Notes (Signed)
Time out for central line placement

## 2023-01-04 NOTE — Progress Notes (Signed)
Pt HR 46 bpm, BP 75/32- Dr. Mylo Red contacted regarding vital signs. Pt endorses dizziness. 500cc bolus ordered- verbal order from Dr. Mylo Red

## 2023-01-04 NOTE — Consult Note (Signed)
Cardiology Consultation   Patient ID: [name] MRN: [id number]; DOB: 1961-04-26  Admit date: 01/04/2023 Date of Consult: 01/04/2023  PCP:  Hayden Rasmussen, MD   [place] [facility] Cardiologist:  Nelva Bush, MD        Patient Profile:   [name] is a 61 y.o. female with a hx of paroxysmal atrial fibrillation, essential hypertension, HFpEF, nonobstructive coronary artery disease,Type II diabetes, sleep apnea, morbid obesity, depression, recent gastroenteritis who is being seen 01/04/2023 for the evaluation of symptomatic bradycardia and hypotension after coronary CTA at the request of Dr. Cherylann Banas.  History of Present Illness:   [name] is a 62 year old female with the previously mentioned past medical history of paroxysmal atrial fibrillation diagnosed in 2015 on metoprolol and flecainide, essential hypertension, HFpEF with last LVEF of  60-65%,nonobstructive coronary artery disease on LHC completed in 2019, type II diabetes, sleep apnea, morbid obesity, breast cancer, and depression.   She had had three emergency department visits within the last few months. 10/23 seen for chest pain and anxiety. CXR revealed mild edema, furosemide was increased to 60 mg daily for three days and she was advised to follow-up with outpatient providers. 10/30/2022 after having irregular tachy palpitations, lightheadedness, and chest discomfort. Found to be in atrial fibrillation in the emergency department with flecainide increased to 100 mg bid. Again on 11/08/22 with complaints of mild chest discomfort and having an elevated blood pressure. CXR revealed mild fluid overload likely pulmonary edema, she was treated with furosemide. HS troponins were normal. At her last outpatient follow-up in clinic with her three recent visits to the emergency department she was scheduled for a coronary CTA.  Patient had taken her home medication of metoprolol 100 mg and flecainide 100 mg prior to  her coronary CTA this morning. Initial vital signs were recorded as heart rate of 69-80 bpm and blood pressure 115/43, patient was ordered an additional dose of IV 10 mg Lopressor. Roughly 30 minutes later heart rate was recorded at 46 bpm and hypotensive with blood pressure of 75/32. Patient received 556m bolus of NS. She has received 0.8 mg of nitroglycerin and became more hypotensive and going in and out of consciousness. She was transported to the emergency department where she was found to be in a junctional rhythm with a rate in the 30's and blood pressure as low as 50 systolic. Cardiology consulted for abnormal presentation with hypotension and junctional rhythm after coronary CTA.  Initial vital signs: blood pressure 106/94, pulse 38, resp 20  Pertinent labs: WBC 11.1, hgb 10, hct 31, sodium 133, calcium 8.3, glucose 118, HS troponin 4    Past Medical History:  Diagnosis Date   Anxiety 08/23/2014   Chronic heart failure with preserved ejection fraction (HFpEF) ([place]    a. 02/2000 MUGA: EF 68%; b. 06/2005 Echo: EF 55-65%; c. 06/2017 Echo: EF 60-65%; d. 08/2020 Echo: EF 60-65%, no rwma, nl RV fxn. Mild BAE. Mild MR.   Current moderate episode of major depressive disorder without prior episode ([facility] 12/31/2017   Dependent edema 10/05/2011   DNR (do not resuscitate) 12/06/2022   GERD (gastroesophageal reflux disease) 12/22/1999   Formatting of this note might be different from the original. Controlled by OTC meds   History of breast cancer 10/05/2011   Hypertension 10/05/2011   Long term current use of anticoagulant therapy 08/29/2014   Mild persistent asthma without complication 070/12/7492  Morbid obesity with BMI of 60.0-69.9, adult ([facility] 09/06/2018  Non-obstructive CAD (coronary artery disease)    a. 12/2017 Lexiscan PET/CT: mid anterior, apical lateral, and apical ischemia; b. 01/2018 Cath: LM nl, LAD & LCX 10-30% diff dzs throughout, RCA min irregs (<10%)-->Med rx.   Obstructive sleep  apnea, adult 09/06/2018   Paroxysmal atrial fibrillation ([place]) 04/21/2019   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/2014   Type 2 diabetes mellitus without complication, without long-term current use of insulin ([place]) 04/21/2019   Urinary bladder incontinence 06/13/2013    Past Surgical History:  Procedure Laterality Date   CARDIAC CATHETERIZATION  02/16/2018   No stents;Dr. Collene Mares, [place] [facility]   COLONOSCOPY WITH PROPOFOL N/A 11/28/2021   Procedure: COLONOSCOPY WITH PROPOFOL;  Surgeon: Carol Ada, MD;  Location: [facility] ENDOSCOPY;  Service: Endoscopy;  Laterality: N/A;   FINGER SURGERY Right    MASTECTOMY Right      Home Medications:  Prior to Admission medications   Medication Sig Start Date End Date Taking? Authorizing Provider  albuterol (PROVENTIL HFA;VENTOLIN HFA) 108 (90 Base) MCG/ACT inhaler Inhale 2 puffs into the lungs every 6 (six) hours as needed for wheezing or shortness of breath. 04/10/17   Barry Dienes, NP  apixaban (ELIQUIS) 5 MG TABS tablet Take 1 tablet (5 mg total) by mouth every 12 (twelve) hours. 10/01/22   Theora Gianotti, NP  Cholecalciferol 125 MCG (5000 UT) CHEW Chew 5,000 Units by mouth daily.    [provider]  clonazePAM (KLONOPIN) 0.5 MG tablet Take 0.5 mg by mouth 2 (two) times daily. 11/19/22   [provider]  fexofenadine (ALLEGRA) 180 MG tablet Take 180 mg by mouth daily.    [provider]  flecainide (TAMBOCOR) 100 MG tablet Take 1 tablet (100 mg total) by mouth 2 (two) times daily. 11/26/22   Theora Gianotti, NP  furosemide (LASIX) 40 MG tablet Take 1 tablet (40 mg total) by mouth daily for 1 day. 12/08/22 12/09/22  Geradine Girt, DO  lamoTRIgine (LAMICTAL) 100 MG tablet Take 150 mg by mouth every 12 (twelve) hours.    [provider]  levothyroxine (SYNTHROID) 50 MCG tablet Take 50 mcg by mouth daily before breakfast.    [provider]  lisinopril (ZESTRIL) 40 MG tablet  Take 1 tablet (40 mg total) by mouth daily. 12/14/22   Geradine Girt, DO  metFORMIN (GLUCOPHAGE) 500 MG tablet Take 1,000 mg by mouth 2 (two) times daily. 11/16/22   [provider]  montelukast (SINGULAIR) 10 MG tablet Take 10 mg by mouth at bedtime.    [provider]  Olopatadine HCl (PATADAY OP) Place 1 drop into both eyes daily.    [provider]  omeprazole (PRILOSEC) 20 MG capsule Take 20 mg by mouth every 12 (twelve) hours.    [provider]  ondansetron (ZOFRAN-ODT) 4 MG disintegrating tablet Take 1 tablet (4 mg total) by mouth every 8 (eight) hours as needed for nausea or vomiting. 12/08/22   Geradine Girt, DO  Prenatal Vit-Fe Fumarate-FA (PRENATAL MULTIVITAMIN) TABS tablet Take 1 tablet by mouth daily. With iron    [provider]  verapamil (CALAN-SR) 180 MG CR tablet Take 2 tablets (360 mg total) by mouth daily. 12/09/22   Geradine Girt, DO    Inpatient Medications: Scheduled Meds:  atropine       Continuous Infusions:  glucagon (human recombinant) (GLUCAGEN) 5 mg in dextrose 5 % 50 mL (0.1 mg/mL) infusion     PRN Meds: atropine  Allergies:  Allergies  Allergen Reactions   Erythromycin Diarrhea    Social History:   Social History   Socioeconomic History   Marital status: Married    Spouse name: Not on file   Number of children: Not on file   Years of education: Not on file   Highest education level: Not on file  Occupational History   Not on file  Tobacco Use   Smoking status: Former    Packs/day: 0.30    Years: 4.00    Total pack years: 1.20    Types: Cigarettes    Quit date: 12/22/1983    Years since quitting: 39.0   Smokeless tobacco: Never  Vaping Use   Vaping Use: Never used  Substance and Sexual Activity   Alcohol use: Yes    Comment: "not enough"   Drug use: No   Sexual activity: Not on file  Other Topics Concern   Not on file  Social History Narrative   Not on file   Social Determinants  of Health   Financial Resource Strain: Not on file  Food Insecurity: No Food Insecurity (12/07/2022)   Hunger Vital Sign    Worried About Running Out of Food in the Last Year: Never true    Ran Out of Food in the Last Year: Never true  Transportation Needs: No Transportation Needs (12/07/2022)   PRAPARE - Hydrologist (Medical): No    Lack of Transportation (Non-Medical): No  Physical Activity: Not on file  Stress: Not on file  Social Connections: Not on file  Intimate Partner Violence: Not At Risk (12/07/2022)   Humiliation, Afraid, Rape, and Kick questionnaire    Fear of Current or Ex-Partner: No    Emotionally Abused: No    Physically Abused: No    Sexually Abused: No    Family History:    Family History  Problem Relation Age of Onset   Heart Problems Mother    Hypertension Mother    Angina Mother    Atrial fibrillation Mother    Heart Problems Father    Hypertension Father    Kidney Stones Sister    Kidney cancer Neg Hx    Prostate cancer Neg Hx    Bladder Cancer Neg Hx      ROS:  Please see the history of present illness.  Review of Systems  Reason unable to perform ROS: due to patient condition with altered LOC.    All other ROS reviewed and negative.     Physical Exam/Data:   Vitals:   01/04/23 1440 01/04/23 1500 01/04/23 1530 01/04/23 1545  BP: (!) 84/59 (!) 87/62 (!) 47/35 (!) 95/58  Pulse: (!) 37 (!) 36 (!) 34 63  Resp: (!) 29 (!) 22 (!) 21 (!) 28  SpO2: 96% 94% 98% 93%  Weight:      Height:       No intake or output data in the 24 hours ending 01/04/23 1604    01/04/2023    2:12 PM 12/07/2022   12:09 PM 11/20/2022    2:39 PM  Last 3 Weights  Weight (lbs) 364 lb 13.8 oz 364 lb 13.8 oz 367 lb  Weight (kg) 165.5 kg 165.5 kg 166.47 kg     Body mass index is 60.72 kg/m.  General:  Well nourished, well developed, altered LOC HEENT: normal, glasses on Neck: no JVD Vascular: No carotid bruits; Distal pulses 2+  bilaterally Cardiac:  normal S1, S2; RRR; I/VI murmur  Lungs:  clear  to auscultation bilaterally, no wheezing, rhonchi or rales  Abd: soft, nontender,obese, no hepatomegaly  Ext: no edema Musculoskeletal:  No deformities, BUE and BLE strength normal and equal Skin: warm and dry  Neuro:  CNs 2-12 intact, no focal abnormalities noted Psych:  Moans to name but is not currently following commands or answering questions  EKG:  The EKG was personally reviewed and demonstrates:  junctional rate of 38 with ST elevation noted throughout Telemetry:  Telemetry was personally reviewed and demonstrates:  junctional rate in the 30's with peaked t waves  Relevant CV Studies: Coronary CTA 01/04/23 1. Normal coronary calcium score of 0.  Patient is low risk.   2. Normal coronary origin with right dominance.   3. No evidence of CAD.   4. CAD-RADS 0. Consider non-atherosclerotic causes of chest pain.   5. Image quality degraded by obesity related artifacts.  TTE 08/30/20 1. Left ventricular ejection fraction, by estimation, is 60 to 65%. The  left ventricle has normal function. The left ventricle has no regional  wall motion abnormalities. Left ventricular diastolic parameters were  normal.   2. Right ventricular systolic function is normal. The right ventricular  size is normal. Tricuspid regurgitation signal is inadequate for assessing  PA pressure.   3. Left atrial size was mildly dilated.   4. Right atrial size was mildly dilated.   5. Mild mitral valve regurgitation.   Laboratory Data:  High Sensitivity Troponin:   Recent Labs  Lab 01/04/23 1410  TROPONINIHS 4     Chemistry Recent Labs  Lab 01/04/23 1410  NA 133*  K 4.2  CL 101  CO2 25  GLUCOSE 118*  BUN 13  CREATININE 0.67  CALCIUM 8.3*  GFRNONAA >60  ANIONGAP 7    No results for input(s): "PROT", "ALBUMIN", "AST", "ALT", "ALKPHOS", "BILITOT" in the last 168 hours. Lipids No results for input(s): "CHOL", "TRIG", "HDL",  "LABVLDL", "LDLCALC", "CHOLHDL" in the last 168 hours.  Hematology Recent Labs  Lab 01/04/23 1410  WBC 11.1*  RBC 3.41*  HGB 10.0*  HCT 31.0*  MCV 90.9  MCH 29.3  MCHC 32.3  RDW 15.7*  PLT 284   Thyroid No results for input(s): "TSH", "FREET4" in the last 168 hours.  BNPNo results for input(s): "BNP", "PROBNP" in the last 168 hours.  DDimer No results for input(s): "DDIMER" in the last 168 hours.   Radiology/Studies:  CT CORONARY MORPH W/CTA COR W/SCORE W/CA W/CM &/OR WO/CM  Addendum Date: 01/04/2023   ADDENDUM REPORT: 01/04/2023 15:44 ADDENDUM: The following report is an over-read performed by radiologist Dr. Dahlia Bailiff of [facility], [place] on January 04, 2022. This over-read does not include interpretation of cardiac or coronary anatomy or pathology. The coronary CTA interpretation by the cardiologist is attached. COMPARISON:  None. FINDINGS: Vascular: No acute non-cardiac vascular finding. Mediastinum/Nodes: No pathologically enlarged mediastinal, or hilar lymph nodes, in the visualized portions of the chest. Distal esophagus is grossly unremarkable. Lungs/Pleura: Mosaic attenuation of the lungs. No pleural effusion. No pneumothorax. Upper Abdomen: No acute abnormality. Musculoskeletal: Multilevel degenerative changes spine. IMPRESSION: Mosaic attenuation of the lungs, which can be seen in the setting of small airways disease or study related artifact. Electronically Signed   By: Dahlia Bailiff M.D.   On: 01/04/2023 15:44   Result Date: 01/04/2023 CLINICAL DATA:  Chest pain EXAM: Cardiac/Coronary  CTA TECHNIQUE: The patient was scanned on a [employer] scanner. : A prospective scan was triggered in the descending thoracic aorta. Axial non-contrast  3 mm slices were carried out through the heart. The data set was analyzed on a dedicated work station and scored using the [place]. Gantry rotation speed was 66 msecs and collimation was .6 mm. '100mg'$  of metoprolol and  0.8 mg of sl NTG was given. The 3D data set was reconstructed in 5% intervals of the 60-95 % of the R-R cycle. Diastolic phases were analyzed on a dedicated work station using MPR, MIP and VRT modes. The patient received 125 cc of contrast. FINDINGS: Aorta:  Normal size.  No calcifications.  No dissection. Aortic Valve:  Trileaflet.  No calcifications. Coronary Arteries:  Normal coronary origin.  Right dominance. RCA is a dominant artery that gives rise to PDA and PLA. There is no plaque. Left main gives rise to LAD and LCX arteries.  LM has no disease. LAD has no plaque. LCX is a non-dominant artery.  There is no plaque. Other findings: Normal pulmonary vein drainage into the left atrium. Normal left atrial appendage without a thrombus. Normal size of the pulmonary artery. IMPRESSION: 1. Normal coronary calcium score of 0.  Patient is low risk. 2. Normal coronary origin with right dominance. 3. No evidence of CAD. 4. CAD-RADS 0. Consider non-atherosclerotic causes of chest pain. 5. Image quality degraded by obesity related artifacts. Electronically Signed: By: Kate Sable M.D. On: 01/04/2023 15:33     Assessment and Plan:   Possible beta blocker toxicity -Presented to the emergency department status post coronary CTA with bradycardia, hypotension, and junctional rhythm with altered level of consciousness -She had already received a 500 cc bolus of normal saline prior to arrival in the emergency department -It was decided that she will be treated with glucagon 5 mg bolus and if needed can be started on drip -She was also given 1 mg of epinephrine without marked change in heart rate of blood pressure  -Will need to monitor for hypocalcemia and hyperglycemia -Continue with cardiac monitoring -Monitor/trend/replete electrolytes as needed, pharmacy is already been consulted for electrolyte management -Daily BMP -Continue to monitor LOC and ensure adequate airway maintenance  Paroxysmal atrial  fibrillation -Currently in a junctional rhythm with rate 30s and 40s -Had previously been maintaining sinus on metoprolol and flecainide -Continue with cardiac monitoring -Continue apixaban 5 mg twice daily for CHA2DS2-VASc of at least 4 -PTA metoprolol will need to be discontinued  Chronic HFpEF -LVEF of 60-65% on echocardiogram in 2021 -Appears euvolemic but body habitus makes exam challenging -Recommend repeat echocardiogram after recent events -Daily weight, I's and O's, low-sodium diet -PTA diuretics currently on hold due to hypotension  Essential hypertension with marked hypotension -Continued hypotension with systolic blood pressures in the 60s -Recommend Levophed drip to titrate to maintain MAP 65 or greater -Vital signs per unit protocol  Type II diabetes -Serum glucose 118 -Hemoglobin A1c 6.2 12/06/2022 -Management per primary team   Risk Assessment/Risk Scores:          CHA2DS2-VASc Score = 4   This indicates a 4.8% annual risk of stroke. The patient's score is based upon: CHF History: 1 HTN History: 1 Diabetes History: 0 Stroke History: 0 Vascular Disease History: 1 Age Score: 0 Gender Score: 1         For questions or updates, please contact [place] Please consult [email or web address] for contact info under    Signed, Mel Tadros, NP  01/04/2023 4:04 PM

## 2023-01-04 NOTE — Procedures (Signed)
Central Venous Catheter Insertion Procedure Note  [name]  [id number]  November 25, 1961  Date:01/04/23  Time:6:32 PM   Provider Performing:Patrici Minnis Micheline Chapman   Procedure: Insertion of Non-tunneled Central Venous [id number]) with US guidance ([id number])   Indication(s) Medication administration  Consent Risks of the procedure as well as the alternatives and risks of each were explained to the patient and/or caregiver.  Consent for the procedure was obtained and is signed in the bedside chart  Anesthesia Topical only with 1% lidocaine   Timeout Verified patient identification, verified procedure, site/side was marked, verified correct patient position, special equipment/implants available, medications/allergies/relevant history reviewed, required imaging and test results available.  Sterile Technique Maximal sterile technique including full sterile barrier drape, hand hygiene, sterile gown, sterile gloves, mask, hair covering, sterile ultrasound probe cover (if used).  Procedure Description Area of catheter insertion was cleaned with chlorhexidine and draped in sterile fashion.  With real-time ultrasound guidance a central venous catheter was placed into the right internal jugular vein. Nonpulsatile blood flow and easy flushing noted in all ports.  The catheter was sutured in place and sterile dressing applied.  Complications/Tolerance None; patient tolerated the procedure well. Chest X-ray is ordered to verify placement for internal jugular or subclavian cannulation.   Chest x-ray is not ordered for femoral cannulation.  EBL Minimal  Specimen(s) None  Donell Beers, AGNP  Pulmonary/Critical Care Pager [phone] (please enter 7 digits) PCCM Consult Pager [phone] (please enter 7 digits)

## 2023-01-04 NOTE — Consult Note (Signed)
PHARMACY CONSULT NOTE - FOLLOW UP  Pharmacy Consult for Electrolyte Monitoring and Replacement   Recent Labs: Potassium (mmol/L)  Date Value  01/04/2023 4.2   Calcium (mg/dL)  Date Value  01/04/2023 8.3 (L)   Albumin (g/dL)  Date Value  12/06/2022 3.1 (L)   Sodium (mmol/L)  Date Value  01/04/2023 133 (L)     Assessment: 62year old female presents to ED with hypotension and tachycardia. Pharmacy consutled for management of electrolytes.  Goal of Therapy:  Electrolytes WNL  Plan:  No repletion warranted at this time. Continue to monitor BMP daily with AM labs  Darrick Penna ,PharmD Clinical Pharmacist 01/04/2023 4:23 PM

## 2023-01-04 NOTE — Progress Notes (Signed)
PHARMACIST - PHYSICIAN COMMUNICATION  CONCERNING:  Enoxaparin (Lovenox) for DVT Prophylaxis   RECOMMENDATION: Patient was prescribed enoxaparin '40mg'$  q24 hours for VTE prophylaxis.   Filed Weights   01/04/23 1412  Weight: (!) 165.5 kg (364 lb 13.8 oz)    Body mass index is 60.72 kg/m.  Estimated Creatinine Clearance: 117 mL/min (by C-G formula based on SCr of 0.67 mg/dL).  Based on [place] patient is candidate for enoxaparin 0.'5mg'$ /kg TBW SQ every 24 hours based on BMI being >30.  DESCRIPTION: Pharmacy has adjusted enoxaparin dose per [facility] policy.  Patient is now receiving enoxaparin 82.5 mg every 24 hours   Benita Gutter 01/04/2023 4:24 PM

## 2023-01-04 NOTE — ED Provider Notes (Addendum)
[facility] Provider Note    Event Date/Time   First MD Initiated Contact with Patient 01/04/23 1618     (approximate)   History   Hypotension   HPI  [name] is a 62 y.o. female with a history of hypertension, paroxysmal atrial fibrillation, CHF, diabetes, sleep apnea, and obesity who presents with bradycardia, hypotension, and altered mental status after a cardiac procedure.  The patient came for a coronary CTA today.  She took 100 mg of flecainide and 100 mg of metoprolol prior to the CTA as instructed.  She received a 10 mg dose of IV metoprolol and then became bradycardic and hypotensive.  She was given 500 mL normal saline on her way to the ED.  On my initial assessment the patient was somnolent but arousable and denied any acute complaints other than feeling weak.  She was unable to give much other history.  I reviewed the past medical records.  The patient was admitted last month for gastroenteritis, vomiting, and syncope.   Physical Exam   Triage Vital Signs: ED Triage Vitals  Enc Vitals Group     BP 01/04/23 1410 (!) 106/94     Pulse Rate 01/04/23 1415 (!) 38     Resp 01/04/23 1415 20     Temp --      Temp src --      SpO2 01/04/23 1415 (!) 83 %     Weight 01/04/23 1412 (!) 364 lb 13.8 oz (165.5 kg)     Height 01/04/23 1412 '5\' 5"'$  (1.651 m)     Head Circumference --      Peak Flow --      Pain Score 01/04/23 1412 0     Pain Loc --      Pain Edu? --      Excl. in [place]? --     Most recent vital signs: Vitals:   01/04/23 1630 01/04/23 1635  BP:  (!) 125/90  Pulse: (!) 47 (!) 46  Resp: 19 18  SpO2: 95% 98%     General: Somnolent but arousable, weak appearing. CV:  Good peripheral perfusion.  Bradycardic, regular rhythm. Resp:  Normal effort.  Abd:  No distention.  Other:  Motor intact in all extremities.  Normal speech.   ED Results / Procedures / Treatments   Labs (all labs ordered are listed, but only abnormal results  are displayed) Labs Reviewed  BASIC METABOLIC PANEL - Abnormal; Notable for the following components:      Result Value   Sodium 133 (*)    Glucose, Bld 118 (*)    Calcium 8.3 (*)    All other components within normal limits  CBC WITH DIFFERENTIAL/PLATELET - Abnormal; Notable for the following components:   WBC 11.1 (*)    RBC 3.41 (*)    Hemoglobin 10.0 (*)    HCT 31.0 (*)    RDW 15.7 (*)    Abs Immature Granulocytes 0.11 (*)    All other components within normal limits  CBC  CREATININE, SERUM  MAGNESIUM  PHOSPHORUS  TROPONIN I (HIGH SENSITIVITY)  TROPONIN I (HIGH SENSITIVITY)     EKG  ED ECG REPORT I, Arta Silence, the attending physician, personally viewed and interpreted this ECG.  Date: 01/04/2023 EKG Time: 1425 Rate: 37 Rhythm: Sinus bradycardia  QRS Axis: normal Intervals: Short PR ST/T Wave abnormalities: Nonspecific ST abnormalities Narrative Interpretation: no evidence of acute ischemia    RADIOLOGY     PROCEDURES:  Critical Care performed: Yes, see critical care procedure note(s)  .Critical Care  Performed by: Arta Silence, MD Authorized by: Arta Silence, MD   Critical care provider statement:    Critical care time (minutes):  75   Critical care time was exclusive of:  Separately billable procedures and treating other patients   Critical care was necessary to treat or prevent imminent or life-threatening deterioration of the following conditions:  Cardiac failure, circulatory failure and shock   Critical care was time spent personally by me on the following activities:  Development of treatment plan with patient or surrogate, discussions with consultants, evaluation of patient's response to treatment, examination of patient, ordering and review of laboratory studies, ordering and review of radiographic studies, ordering and performing treatments and interventions, pulse oximetry, re-evaluation of patient's condition, review of  old charts and obtaining history from patient or surrogate   Care discussed with: admitting provider      MEDICATIONS ORDERED IN ED: Medications  glucagon (human recombinant) (GLUCAGEN) 5 mg in dextrose 5 % 50 mL (0.1 mg/mL) infusion (has no administration in time range)  docusate sodium (COLACE) capsule 100 mg (has no administration in time range)  polyethylene glycol (MIRALAX / GLYCOLAX) packet 17 g (has no administration in time range)  enoxaparin (LOVENOX) injection 82.5 mg (has no administration in time range)  acetaminophen (TYLENOL) tablet 650 mg (has no administration in time range)  0.9 %  sodium chloride infusion (has no administration in time range)  norepinephrine (LEVOPHED) '4mg'$  in 246m (0.016 mg/mL) premix infusion (2 mcg/min Intravenous New Bag/Given 01/04/23 1633)  atropine 1 MG/10ML injection (1 mg  Given 01/04/23 1500)  glucagon (human recombinant) (GLUCAGEN) 5 mg in dextrose 5 % 50 mL IVPB (0 mg Intravenous Stopped 01/04/23 1547)  EPINEPHrine (ADRENALIN) 1 MG/10ML injection 1 mg (1 mg Intravenous Given 01/04/23 1539)     IMPRESSION / MDM / [place]/ ED COURSE  I reviewed the triage vital signs and the nursing notes.  62year old female with PMH as noted above presented from a coronary CT angiogram with bradycardia and hypotension after having received p.o. and IV metoprolol.   Differential diagnosis includes, but is not limited to, beta-blocker overdose, vasovagal syncope, dehydration, electrolyte abnormality, ACS.  On arrival to the ED the patient was somnolent but arousable.  She appeared more awake after being moved to the ED stretcher.  She had a heart rate in the 30s and blood pressure initially in the 70s and then 878Esystolic.  She started to become more alert.  She was given an additional 500 mL fluid bolus and appeared to be improving.  After short while, the patient became more hypotensive with a systolic as low as the 442P became diaphoretic and more  somnolent.  Heart rate remained in the 30s.  A second liter of fluid was initiated.    I consulted and discussed the case with Dr. AFletcher Anonfrom cardiology.  We ordered a glucagon bolus and infusion for presentation consistent with beta-blocker overdose.  While the glucagon was being prepared, the patient remained significantly hypotensive and altered.  We gave a dose of IV epinephrine which immediately improved the patient's blood pressure and heart rate and she became alert and oriented.  Glucagon bolus was given.  The blood pressure stabilized in the 80s and the patient remained alert.  We will continue to monitor closely, initiate the glucagon infusion, and plan for ICU admission.  Patient's presentation is most consistent with acute presentation with potential threat  to life or bodily function.  The patient is on the cardiac monitor to evaluate for evidence of arrhythmia and/or significant heart rate changes.  ----------------------------------------- 4:39 PM on 01/04/2023 -----------------------------------------    I consulted and discussed the case with Dr. Lanney Gins from the ICU.  He agrees to admit the patient.  FINAL CLINICAL IMPRESSION(S) / ED DIAGNOSES   Final diagnoses:  Overdose of beta-adrenergic antagonist drug, accidental or unintentional, initial encounter     Rx / DC Orders   ED Discharge Orders     None        Note:  This document was prepared using Dragon voice recognition software and may include unintentional dictation errors.    Arta Silence, MD 01/04/23 1654    Arta Silence, MD 01/04/23 1654

## 2023-01-04 NOTE — ED Notes (Signed)
Levophed titrated up 50mg

## 2023-01-04 NOTE — ED Notes (Signed)
Amp of epi given

## 2023-01-04 NOTE — Progress Notes (Signed)
Pt HR still ranging from 69-80. BP is 115/43. Dr. Mylo Red contacted by this RN and placed verbal order for '10mg'$  of metoprolol. Provider aware of vitals.

## 2023-01-04 NOTE — ED Notes (Signed)
Report given to ICU RN Levada Dy

## 2023-01-04 NOTE — H&P (Signed)
NAME:  [name], MRN:  [id number], DOB:  1961-08-19, LOS: 0 ADMISSION DATE:  01/04/2023, CONSULTATION DATE: 01/04/23 REFERRING MD: Dr. Cherylann Banas , CHIEF COMPLAINT: Symptomatic Bradycardia and Hypotension   History of Present Illness:  This is a 62 yo female who presented to [facility] for a scheduled coronary CTA the morning of 01/15.  She reports she took her 100 mg of flecainide and 100 mg of metoprolol prior to the CTA as instructed.  Upon arrival to [facility] her initial heart rate ranged between 69-80 bpm and bp 115/43.  Cardiologist notified and pt received an additional 10 mg of iv metoprolol.  30 minutes following administration pt became bradycardic hr 46 and hypotensive bp 75/32.  She received 500 ml iv NS bolus.  She subsequently received 0.8 mg of nitroglycerin and became more hypotensive with  intermittent loss of consciousness.  Pt transported to the ER and en route telemetry reading junctional rhythm with hr in the 30's, and pt remained hypotensive sbp 50.    ED Course  In the ER pt remained bradycardic, hypotensive, and somnolent.  She received 5 mg iv bolus of glucagon and 1 mg of epinephrine with brief improvement in blood pressure and heart rate.  However, she immediately became hypotensive and bradycardic again therefore glucagon gtt ordered.  PCCM team contacted for ICU admission and levophed gtt ordered.   EKG: Junctional bradycardia, heart rate 38 with nonspecific ST abnormalities  Significant labs: Na+ 133/glucose 118/calcium 8.3/wbc 11.1/hgb 10.0 Coronary CTA: . Normal coronary calcium score of 0.  Patient is low risk. Normal coronary origin with right dominance. No evidence of CAD. CAD-RADS 0. Consider non-atherosclerotic causes of chest pain. Image quality degraded by obesity related artifacts.  Pertinent  Medical History  Anxiety  Chronic HFpEF: (Echo 08/30/2020: EF 60 to 65%)  Dependant Edema  GERD  Mild Persistent Asthma  Morbid Obesity  Non-Obstructive  CAD OSA Paroxysmal Atrial Fibrillation: on Eliquis  Type II Diabetes Mellitus  Urinary Bladder Incontinence   Significant Hospital Events: Including procedures, antibiotic start and stop dates in addition to other pertinent events   01/15: Pt admitted to ICU with symptomatic bradycardia and hypotension suspected secondary to beta blocker overdose requiring vasopressor and glucagon gtt   Interim History / Subjective:  Pt hypotensive despite levophed gtt infusing '@10'$  mcg/min and intermittent amps of epinephrine.  She also reports visual disturbance "seeing dark blotches"  Objective   Blood pressure (!) 95/58, pulse 63, resp. rate (!) 28, height '5\' 5"'$  (1.651 m), weight (!) 165.5 kg, SpO2 93 %.       No intake or output data in the 24 hours ending 01/04/23 1636 Filed Weights   01/04/23 1412  Weight: (!) 165.5 kg    Examination: General: Acutely-ill obese appearing female, nauseated resting on stretcher  HENT: Supple, difficult to assess for JVD due to body habitus   Lungs: Clear throughout, even, non labored  Cardiovascular: Junctional bradycardia, no r/g, 2+ radial/1+ distal pulses, no edema  Abdomen: +BS x4, obese, soft, non tender, non distended  Extremities: Normal bulk and tone, moves all extremities  Neuro: Alert and oriented, following commands, PERRLA  GU: Deferred   Resolved Hospital Problem list     Assessment & Plan:  Symptomatic bradycardia and hypotension possibly secondary to beta blocker toxicity  Hx: HFpEF, Paroxysmal Atrial Fibrillation, Essential HTN, and Non-Obstructive CAD - Continuous telemetry monitoring  - Prn epinephrine or levophed gtt to maintain map 65 or higher - Hold outpatient beta blocker and antihypertensives  for now  - Repeat EKG  - Replete electrolytes as indicated  - Trend troponin's until peaked  - D-dimer pending  - TSH and free T3 results pending  - Echo pending   OSA  Hx: Mild persistent asthma  - Supplemental O2 for dyspnea and/or  hypoxia  - Prn bronchodilator therapy  - CPAP qhs   Type II Diabetes Mellitus  - CBG's q4hrs  - Moderate SSI  - Target range 140 to 180  Best Practice (right click and "Reselect all SmartList Selections" daily)   Diet/type: NPO; Ice chips only DVT prophylaxis: LMWH GI prophylaxis: N/A Lines: Central line Foley:  N/A Code Status:  full code Last date of multidisciplinary goals of care discussion [N/A]  01/04/23: Discussed code status with pt and she states she wants to be a Full Code.  Also, discussed plan of care and pts condition with the pt and family member at bedside all questions were answered.   Labs   CBC: Recent Labs  Lab 01/04/23 1410  WBC 11.1*  NEUTROABS 7.7  HGB 10.0*  HCT 31.0*  MCV 90.9  PLT 017    Basic Metabolic Panel: Recent Labs  Lab 01/04/23 1410  NA 133*  K 4.2  CL 101  CO2 25  GLUCOSE 118*  BUN 13  CREATININE 0.67  CALCIUM 8.3*   GFR: Estimated Creatinine Clearance: 117 mL/min (by C-G formula based on SCr of 0.67 mg/dL). Recent Labs  Lab 01/04/23 1410  WBC 11.1*    Liver Function Tests: No results for input(s): "AST", "ALT", "ALKPHOS", "BILITOT", "PROT", "ALBUMIN" in the last 168 hours. No results for input(s): "LIPASE", "AMYLASE" in the last 168 hours. No results for input(s): "AMMONIA" in the last 168 hours.  ABG No results found for: "PHART", "PCO2ART", "PO2ART", "HCO3", "TCO2", "ACIDBASEDEF", "O2SAT"   Coagulation Profile: No results for input(s): "INR", "PROTIME" in the last 168 hours.  Cardiac Enzymes: No results for input(s): "CKTOTAL", "CKMB", "CKMBINDEX", "TROPONINI" in the last 168 hours.  HbA1C: Hgb A1c MFr Bld  Date/Time Value Ref Range Status  12/06/2022 01:23 AM 6.2 (H) 4.8 - 5.6 % Final    Comment:    (NOTE)         Prediabetes: 5.7 - 6.4         Diabetes: >6.4         Glycemic control for adults with diabetes: <7.0     CBG: No results for input(s): "GLUCAP" in the last 168 hours.  Review of  Systems: Positives in BOLD   Gen: fever, chills, weight change, fatigue, night sweats HEENT: blurred vision, double vision, hearing loss, tinnitus, sinus congestion, rhinorrhea, sore throat, neck stiffness, dysphagia PULM: Denies shortness of breath, cough, sputum production, hemoptysis, wheezing CV: Denies chest pain, edema, orthopnea, paroxysmal nocturnal dyspnea, palpitations GI: abdominal pain, nausea, vomiting, diarrhea, hematochezia, melena, constipation, change in bowel habits GU: Denies dysuria, hematuria, polyuria, oliguria, urethral discharge Endocrine: Denies hot or cold intolerance, polyuria, polyphagia or appetite change Derm: Denies rash, dry skin, scaling or peeling skin change Heme: Denies easy bruising, bleeding, bleeding gums Neuro: headache, numbness, weakness, slurred speech, loss of memory or consciousness  Past Medical History:  She,  has a past medical history of Anxiety (08/23/2014), Chronic heart failure with preserved ejection fraction (HFpEF) ([place]), Current moderate episode of major depressive disorder without prior episode ([place]) (12/31/2017), Dependent edema (10/05/2011), DNR (do not resuscitate) (12/06/2022), GERD (gastroesophageal reflux disease) (12/22/1999), History of breast cancer (10/05/2011), Hypertension (10/05/2011), Long term current use of anticoagulant  therapy (08/29/2014), Mild persistent asthma without complication (76/22/6333), Morbid obesity with BMI of 60.0-69.9, adult ([place]) (09/06/2018), Non-obstructive CAD (coronary artery disease), Obstructive sleep apnea, adult (09/06/2018), Paroxysmal atrial fibrillation ([place]) (04/21/2019), 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   Surgical History:   Past Surgical History:  Procedure Laterality Date   CARDIAC CATHETERIZATION  02/16/2018   No stents;Dr.  Collene Mares, [place] [facility]   COLONOSCOPY WITH PROPOFOL N/A 11/28/2021   Procedure: COLONOSCOPY WITH PROPOFOL;  Surgeon: Carol Ada, MD;  Location: [facility] ENDOSCOPY;  Service: Endoscopy;  Laterality: N/A;   FINGER SURGERY Right    MASTECTOMY Right      Social History:   reports that she quit smoking about 39 years ago. Her smoking use included cigarettes. She has a 1.20 pack-year smoking history. She has never used smokeless tobacco. She reports current alcohol use. She reports that she does not use drugs.   Family History:  Her family history includes Angina in her mother; Atrial fibrillation in her mother; Heart Problems in her father and mother; Hypertension in her father and mother; Kidney Stones in her sister. There is no history of Kidney cancer, Prostate cancer, or Bladder Cancer.   Allergies Allergies  Allergen Reactions   Erythromycin Diarrhea     Home Medications  Prior to Admission medications   Medication Sig Start Date End Date Taking? Authorizing Provider  albuterol (PROVENTIL HFA;VENTOLIN HFA) 108 (90 Base) MCG/ACT inhaler Inhale 2 puffs into the lungs every 6 (six) hours as needed for wheezing or shortness of breath. 04/10/17   Barry Dienes, NP  apixaban (ELIQUIS) 5 MG TABS tablet Take 1 tablet (5 mg total) by mouth every 12 (twelve) hours. 10/01/22   Theora Gianotti, NP  Cholecalciferol 125 MCG (5000 UT) CHEW Chew 5,000 Units by mouth daily.    [provider]  clonazePAM (KLONOPIN) 0.5 MG tablet Take 0.5 mg by mouth 2 (two) times daily. 11/19/22   [provider]  fexofenadine (ALLEGRA) 180 MG tablet Take 180 mg by mouth daily.    [provider]  flecainide (TAMBOCOR) 100 MG tablet Take 1 tablet (100 mg total) by mouth 2 (two) times daily. 11/26/22   Theora Gianotti, NP  furosemide (LASIX) 40 MG tablet Take 1 tablet (40 mg total) by mouth daily for 1 day. 12/08/22 12/09/22  Geradine Girt, DO  lamoTRIgine  (LAMICTAL) 100 MG tablet Take 150 mg by mouth every 12 (twelve) hours.    [provider]  levothyroxine (SYNTHROID) 50 MCG tablet Take 50 mcg by mouth daily before breakfast.    [provider]  lisinopril (ZESTRIL) 40 MG tablet Take 1 tablet (40 mg total) by mouth daily. 12/14/22   Geradine Girt, DO  metFORMIN (GLUCOPHAGE) 500 MG tablet Take 1,000 mg by mouth 2 (two) times daily. 11/16/22   [provider]  montelukast (SINGULAIR) 10 MG tablet Take 10 mg by mouth at bedtime.    [provider]  Olopatadine HCl (PATADAY OP) Place 1 drop into both eyes daily.    [provider]  omeprazole (PRILOSEC) 20 MG capsule Take 20 mg by mouth every 12 (twelve) hours.    [provider]  ondansetron (ZOFRAN-ODT) 4 MG disintegrating tablet Take 1 tablet (4 mg total) by mouth every 8 (eight) hours as needed for nausea or vomiting. 12/08/22   Geradine Girt, DO  Prenatal Vit-Fe  Fumarate-FA (PRENATAL MULTIVITAMIN) TABS tablet Take 1 tablet by mouth daily. With iron    [provider]  verapamil (CALAN-SR) 180 MG CR tablet Take 2 tablets (360 mg total) by mouth daily. 12/09/22   Geradine Girt, DO     Critical care time: 29 minutes      Donell Beers, [place] Pager [phone] (please enter 7 digits) PCCM Consult Pager [phone] (please enter 7 digits)

## 2023-01-04 NOTE — ED Notes (Signed)
Pt to have central line place Per provider

## 2023-01-04 NOTE — ED Notes (Signed)
Levophed titrated up 30mg

## 2023-01-04 NOTE — ED Notes (Signed)
Levophed titrated up 95mg

## 2023-01-04 NOTE — ED Triage Notes (Signed)
Pt was at her cardiac scan this afternoon and received 0.'8mg'$  of nitroglycerin and shortly after became hypotensive and going in and out of consciousness. Pt taken to ED.

## 2023-01-04 NOTE — ED Notes (Signed)
Levophed titrated up 35mg

## 2023-01-04 NOTE — ED Provider Notes (Signed)
Dr. Cherylann Banas managed this patient. I did not particpate in her ED care.   Delman Kitten, MD 01/04/23 1419

## 2023-01-04 NOTE — ED Notes (Signed)
Levophed titrated up 53mg

## 2023-01-04 NOTE — Progress Notes (Signed)
Addressed order for cpap. Patient states she wears cpap at night. States she is ok using o2 at 2 liters for now as she hopes she will go home tomorrow. Willing to revisit subject matter if she is not discharged or if she changes her mind before tomorrow.

## 2023-01-05 ENCOUNTER — Inpatient Hospital Stay (HOSPITAL_COMMUNITY)
Admit: 2023-01-05 | Discharge: 2023-01-05 | Disposition: A | Discharge status: Home / Self Care | Payer: 59 | Attending: Critical Care Medicine | Admitting: Critical Care Medicine

## 2023-01-05 DIAGNOSIS — R001 Bradycardia, unspecified: Secondary | ICD-10-CM

## 2023-01-05 DIAGNOSIS — I5032 Chronic diastolic (congestive) heart failure: Secondary | ICD-10-CM

## 2023-01-05 DIAGNOSIS — R579 Shock, unspecified: Secondary | ICD-10-CM | POA: Diagnosis not present

## 2023-01-05 DIAGNOSIS — I4891 Unspecified atrial fibrillation: Secondary | ICD-10-CM

## 2023-01-05 LAB — ECHOCARDIOGRAM COMPLETE
AR max vel: 2.06 cm2
AV Area VTI: 2.2 cm2
AV Area mean vel: 2.03 cm2
AV Mean grad: 6.7 mmHg
AV Peak grad: 12.2 mmHg
Ao pk vel: 1.74 m/s
Area-P 1/2: 4.68 cm2
Calc EF: 60.4 %
Height: 65 in
MV M vel: 3.01 m/s
MV Peak grad: 36.2 mmHg
S' Lateral: 3.5 cm
Single Plane A2C EF: 48.5 %
Single Plane A4C EF: 65.5 %
Weight: 5975.35 oz

## 2023-01-05 LAB — BASIC METABOLIC PANEL
Anion gap: 7 (ref 5–15)
BUN: 15 mg/dL (ref 8–23)
CO2: 27 mmol/L (ref 22–32)
Calcium: 8.6 mg/dL — ABNORMAL LOW (ref 8.9–10.3)
Chloride: 101 mmol/L (ref 98–111)
Creatinine, Ser: 0.73 mg/dL (ref 0.44–1.00)
GFR, Estimated: >60 mL/min (ref >60)
Glucose, Bld: 166 mg/dL — ABNORMAL HIGH (ref 70–99)
Potassium: 3.1 mmol/L — ABNORMAL LOW (ref 3.5–5.1)
Sodium: 135 mmol/L (ref 135–145)

## 2023-01-05 LAB — CBC
HCT: 31.8 % — ABNORMAL LOW (ref 36.0–46.0)
Hemoglobin: 10.5 g/dL — ABNORMAL LOW (ref 12.0–15.0)
MCH: 29.3 pg (ref 26.0–34.0)
MCHC: 33 g/dL (ref 30.0–36.0)
MCV: 88.8 fL (ref 80.0–100.0)
Platelets: 329 10*3/uL (ref 150–400)
RBC: 3.58 MIL/uL — ABNORMAL LOW (ref 3.87–5.11)
RDW: 15.8 % — ABNORMAL HIGH (ref 11.5–15.5)
WBC: 20.7 10*3/uL — ABNORMAL HIGH (ref 4.0–10.5)
nRBC: 0 % (ref 0.0–0.2)

## 2023-01-05 LAB — URINALYSIS, COMPLETE (UACMP) WITH MICROSCOPIC
Bilirubin Urine: NEGATIVE
Glucose, UA: NEGATIVE mg/dL
Hgb urine dipstick: NEGATIVE
Ketones, ur: 5 mg/dL — AB
Leukocytes,Ua: NEGATIVE
Nitrite: NEGATIVE
Protein, ur: NEGATIVE mg/dL
Specific Gravity, Urine: 1.012 (ref 1.005–1.030)
pH: 6 (ref 5.0–8.0)

## 2023-01-05 LAB — GLUCOSE, CAPILLARY
Glucose-Capillary: 164 mg/dL — ABNORMAL HIGH (ref 70–99)
Glucose-Capillary: 53 mg/dL — ABNORMAL LOW (ref 70–99)
Glucose-Capillary: 85 mg/dL (ref 70–99)
Glucose-Capillary: 82 mg/dL (ref 70–99)

## 2023-01-05 LAB — RESP PANEL BY RT-PCR (RSV, FLU A&B, COVID)  RVPGX2
Influenza A by PCR: NEGATIVE
Influenza B by PCR: NEGATIVE
Resp Syncytial Virus by PCR: NEGATIVE
SARS Coronavirus 2 by RT PCR: NEGATIVE

## 2023-01-05 LAB — C DIFFICILE QUICK SCREEN W PCR REFLEX
C Diff antigen: NEGATIVE
C Diff interpretation: NOT DETECTED
C Diff toxin: NEGATIVE

## 2023-01-05 LAB — MAGNESIUM
Magnesium: 1.9 mg/dL (ref 1.7–2.4)
Magnesium: 2 mg/dL (ref 1.7–2.4)

## 2023-01-05 LAB — POTASSIUM: Potassium: 4.2 mmol/L (ref 3.5–5.1)

## 2023-01-05 LAB — PHOSPHORUS: Phosphorus: 2.4 mg/dL — ABNORMAL LOW (ref 2.5–4.6)

## 2023-01-05 LAB — PROCALCITONIN: Procalcitonin: 2.11 ng/mL

## 2023-01-05 MED ORDER — LEVOTHYROXINE SODIUM 50 MCG PO TABS
50.0000 ug | ORAL_TABLET | Freq: Every day | ORAL | Status: DC
  Administered 2023-01-06 – 2023-01-08 (×3): 50 ug via ORAL
  Filled 2023-01-05 (×3): qty 1

## 2023-01-05 MED ORDER — MONTELUKAST SODIUM 10 MG PO TABS
10.0000 mg | ORAL_TABLET | Freq: Every day | ORAL | Status: DC
  Administered 2023-01-05 – 2023-01-07 (×3): 10 mg via ORAL
  Filled 2023-01-05 (×3): qty 1

## 2023-01-05 MED ORDER — CHLORHEXIDINE GLUCONATE CLOTH 2 % EX PADS
6.0000 | MEDICATED_PAD | Freq: Every day | CUTANEOUS | Status: DC
  Administered 2023-01-05 – 2023-01-08 (×3): 6 via TOPICAL

## 2023-01-05 MED ORDER — PROCHLORPERAZINE EDISYLATE 10 MG/2ML IJ SOLN
10.0000 mg | Freq: Once | INTRAMUSCULAR | Status: AC
  Administered 2023-01-05: 10 mg via INTRAVENOUS
  Filled 2023-01-05: qty 2

## 2023-01-05 MED ORDER — PANTOPRAZOLE SODIUM 40 MG PO TBEC
40.0000 mg | DELAYED_RELEASE_TABLET | Freq: Every day | ORAL | Status: DC
  Administered 2023-01-05 – 2023-01-08 (×4): 40 mg via ORAL
  Filled 2023-01-05 (×4): qty 1

## 2023-01-05 MED ORDER — LORATADINE 10 MG PO TABS
10.0000 mg | ORAL_TABLET | Freq: Every day | ORAL | Status: DC
  Administered 2023-01-05 – 2023-01-08 (×4): 10 mg via ORAL
  Filled 2023-01-05 (×4): qty 1

## 2023-01-05 MED ORDER — OLOPATADINE HCL 0.1 % OP SOLN: 1.0000 [drp] | Freq: Every day | OPHTHALMIC | Status: DC | PRN

## 2023-01-05 MED ORDER — APIXABAN 5 MG PO TABS
5.0000 mg | ORAL_TABLET | Freq: Two times a day (BID) | ORAL | Status: DC
  Administered 2023-01-05 – 2023-01-08 (×7): 5 mg via ORAL
  Filled 2023-01-05 (×7): qty 1

## 2023-01-05 MED ORDER — PIPERACILLIN-TAZOBACTAM 3.375 G IVPB
3.3750 g | Freq: Three times a day (TID) | INTRAVENOUS | Status: DC
  Administered 2023-01-05 – 2023-01-07 (×5): 3.375 g via INTRAVENOUS
  Filled 2023-01-05 (×5): qty 50

## 2023-01-05 MED ORDER — POTASSIUM CHLORIDE 10 MEQ/100ML IV SOLN
10.0000 meq | INTRAVENOUS | Status: AC
  Administered 2023-01-05 (×4): 10 meq via INTRAVENOUS
  Filled 2023-01-05 (×4): qty 100

## 2023-01-05 MED ORDER — ONDANSETRON HCL 4 MG/2ML IJ SOLN: 4.0000 mg | Freq: Four times a day (QID) | INTRAMUSCULAR | Status: DC | PRN

## 2023-01-05 NOTE — H&P (Signed)
NAME:  [name], MRN:  [id number], DOB:  Sep 26, 1961, LOS: 1 ADMISSION DATE:  01/04/2023, CONSULTATION DATE: 01/04/23 REFERRING MD: Dr. Cherylann Banas , CHIEF COMPLAINT: Symptomatic Bradycardia and Hypotension   History of Present Illness:  This is a 62 yo female who presented to [facility] for a scheduled coronary CTA the morning of 01/15.  She reports she took her 100 mg of flecainide and 100 mg of metoprolol prior to the CTA as instructed.  Upon arrival to [facility] her initial heart rate ranged between 69-80 bpm and bp 115/43.  Cardiologist notified and pt received an additional 10 mg of iv metoprolol.  30 minutes following administration pt became bradycardic hr 46 and hypotensive bp 75/32.  She received 500 ml iv NS bolus.  She subsequently received 0.8 mg of nitroglycerin and became more hypotensive with  intermittent loss of consciousness.  Pt transported to the ER and en route telemetry reading junctional rhythm with hr in the 30's, and pt remained hypotensive sbp 50.    ED Course  In the ER pt remained bradycardic, hypotensive, and somnolent.  She received 5 mg iv bolus of glucagon and 1 mg of epinephrine with brief improvement in blood pressure and heart rate.  However, she immediately became hypotensive and bradycardic again therefore glucagon gtt ordered.  PCCM team contacted for ICU admission and levophed gtt ordered.   EKG: Junctional bradycardia, heart rate 38 with nonspecific ST abnormalities  Significant labs: Na+ 133/glucose 118/calcium 8.3/wbc 11.1/hgb 10.0 Coronary CTA: . Normal coronary calcium score of 0.  Patient is low risk. Normal coronary origin with right dominance. No evidence of CAD. CAD-RADS 0. Consider non-atherosclerotic causes of chest pain. Image quality degraded by obesity related artifacts.  Pertinent  Medical History  Anxiety  Chronic HFpEF: (Echo 08/30/2020: EF 60 to 65%)  Dependant Edema  GERD  Mild Persistent Asthma  Morbid Obesity  Non-Obstructive  CAD OSA Paroxysmal Atrial Fibrillation: on Eliquis  Type II Diabetes Mellitus  Urinary Bladder Incontinence   Significant Hospital Events: Including procedures, antibiotic start and stop dates in addition to other pertinent events   01/15: Pt admitted to ICU with symptomatic bradycardia and hypotension suspected secondary to beta blocker overdose requiring vasopressor and glucagon gtt  01/05/23- patient is improved with weaning off glucagon gtt and levophed.  Plan for medical optimization and transfer to [facility].   Interim History / Subjective:  Pt hypotensive despite levophed gtt infusing '@10'$  mcg/min and intermittent amps of epinephrine.  She also reports visual disturbance "seeing dark blotches"  Objective   Blood pressure 121/63, pulse 73, temperature 98.3 F (36.8 C), temperature source Oral, resp. rate (!) 21, height '5\' 5"'$  (1.651 m), weight (!) 169.4 kg, SpO2 100 %.        Intake/Output Summary (Last 24 hours) at 01/05/2023 1340 Last data filed at 01/05/2023 1300 Gross per 24 hour  Intake 1312.47 ml  Output 1200 ml  Net 112.47 ml   Filed Weights   01/04/23 1412 01/05/23 0500  Weight: (!) 165.5 kg (!) 169.4 kg    Examination: General: Acutely-ill obese appearing female, nauseated resting on stretcher  HENT: Supple, difficult to assess for JVD due to body habitus   Lungs: Clear throughout, even, non labored  Cardiovascular: Junctional bradycardia, no r/g, 2+ radial/1+ distal pulses, no edema  Abdomen: +BS x4, obese, soft, non tender, non distended  Extremities: Normal bulk and tone, moves all extremities  Neuro: Alert and oriented, following commands, PERRLA  GU: Deferred   Resolved Hospital Problem  list     Assessment & Plan:  Symptomatic bradycardia and hypotension possibly secondary to beta blocker toxicity  Hx: HFpEF, Paroxysmal Atrial Fibrillation, Essential HTN, and Non-Obstructive CAD - Continuous telemetry monitoring  - Prn epinephrine or levophed gtt to maintain  map 65 or higher - Hold outpatient beta blocker and antihypertensives for now  - Repeat EKG  - Replete electrolytes as indicated  - Trend troponin's until peaked  - D-dimer pending  - TSH and free T3 results pending  - Echo pending   OSA  Hx: Mild persistent asthma  - Supplemental O2 for dyspnea and/or hypoxia  - Prn bronchodilator therapy  - CPAP qhs   Type II Diabetes Mellitus  - CBG's q4hrs  - Moderate SSI  - Target range 140 to 180  Best Practice (right click and "Reselect all SmartList Selections" daily)   Diet/type: NPO; Ice chips only DVT prophylaxis: LMWH GI prophylaxis: N/A Lines: Central line Foley:  N/A Code Status:  full code Last date of multidisciplinary goals of care discussion [N/A]  01/04/23: Discussed code status with pt and she states she wants to be a Full Code.  Also, discussed plan of care and pts condition with the pt and family member at bedside all questions were answered.   Labs   CBC: Recent Labs  Lab 01/04/23 1410 01/05/23 0340  WBC 11.1* 20.7*  NEUTROABS 7.7  --   HGB 10.0* 10.5*  HCT 31.0* 31.8*  MCV 90.9 88.8  PLT 284 329     Basic Metabolic Panel: Recent Labs  Lab 01/04/23 1410 01/05/23 0340  NA 133* 135  K 4.2 3.1*  CL 101 101  CO2 25 27  GLUCOSE 118* 166*  BUN 13 15  CREATININE 0.67 0.73  CALCIUM 8.3* 8.6*  MG 1.9 1.9  PHOS 3.5 2.4*    GFR: Estimated Creatinine Clearance: 118.9 mL/min (by C-G formula based on SCr of 0.73 mg/dL). Recent Labs  Lab 01/04/23 1410 01/05/23 0340  PROCALCITON  --  2.11  WBC 11.1* 20.7*     Liver Function Tests: No results for input(s): "AST", "ALT", "ALKPHOS", "BILITOT", "PROT", "ALBUMIN" in the last 168 hours. No results for input(s): "LIPASE", "AMYLASE" in the last 168 hours. No results for input(s): "AMMONIA" in the last 168 hours.  ABG No results found for: "PHART", "PCO2ART", "PO2ART", "HCO3", "TCO2", "ACIDBASEDEF", "O2SAT"   Coagulation Profile: No results for  input(s): "INR", "PROTIME" in the last 168 hours.  Cardiac Enzymes: No results for input(s): "CKTOTAL", "CKMB", "CKMBINDEX", "TROPONINI" in the last 168 hours.  HbA1C: Hgb A1c MFr Bld  Date/Time Value Ref Range Status  12/06/2022 01:23 AM 6.2 (H) 4.8 - 5.6 % Final    Comment:    (NOTE)         Prediabetes: 5.7 - 6.4         Diabetes: >6.4         Glycemic control for adults with diabetes: <7.0     CBG: Recent Labs  Lab 01/04/23 2317 01/05/23 0328 01/05/23 0739 01/05/23 0826 01/05/23 1124  GLUCAP 286* 164* 53* 85 82    Review of Systems: Positives in BOLD   Gen: fever, chills, weight change, fatigue, night sweats HEENT: blurred vision, double vision, hearing loss, tinnitus, sinus congestion, rhinorrhea, sore throat, neck stiffness, dysphagia PULM: Denies shortness of breath, cough, sputum production, hemoptysis, wheezing CV: Denies chest pain, edema, orthopnea, paroxysmal nocturnal dyspnea, palpitations GI: abdominal pain, nausea, vomiting, diarrhea, hematochezia, melena, constipation, change in bowel habits GU: Denies  dysuria, hematuria, polyuria, oliguria, urethral discharge Endocrine: Denies hot or cold intolerance, polyuria, polyphagia or appetite change Derm: Denies rash, dry skin, scaling or peeling skin change Heme: Denies easy bruising, bleeding, bleeding gums Neuro: headache, numbness, weakness, slurred speech, loss of memory or consciousness  Past Medical History:  She,  has a past medical history of Anxiety (08/23/2014), Chronic heart failure with preserved ejection fraction (HFpEF) ([place]), Current moderate episode of major depressive disorder without prior episode ([place]) (12/31/2017), Dependent edema (10/05/2011), DNR (do not resuscitate) (12/06/2022), GERD (gastroesophageal reflux disease) (12/22/1999), History of breast cancer (10/05/2011), Hypertension (10/05/2011), Long term current use of anticoagulant therapy (08/29/2014), Mild persistent asthma without  complication (83/38/2505), Morbid obesity with BMI of 60.0-69.9, adult ([place]) (09/06/2018), Non-obstructive CAD (coronary artery disease), Obstructive sleep apnea, adult (09/06/2018), Paroxysmal atrial fibrillation ([place]) (04/21/2019), 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   Surgical History:   Past Surgical History:  Procedure Laterality Date   CARDIAC CATHETERIZATION  02/16/2018   No stents;Dr. Collene Mares, [place] [facility]   COLONOSCOPY WITH PROPOFOL N/A 11/28/2021   Procedure: COLONOSCOPY WITH PROPOFOL;  Surgeon: Carol Ada, MD;  Location: [facility] ENDOSCOPY;  Service: Endoscopy;  Laterality: N/A;   FINGER SURGERY Right    MASTECTOMY Right      Social History:   reports that she quit smoking about 39 years ago. Her smoking use included cigarettes. She has a 1.20 pack-year smoking history. She has never used smokeless tobacco. She reports current alcohol use. She reports that she does not use drugs.   Family History:  Her family history includes Angina in her mother; Atrial fibrillation in her mother; Heart Problems in her father and mother; Hypertension in her father and mother; Kidney Stones in her sister. There is no history of Kidney cancer, Prostate cancer, or Bladder Cancer.   Allergies Allergies  Allergen Reactions   Erythromycin Diarrhea     Home Medications  Prior to Admission medications   Medication Sig Start Date End Date Taking? Authorizing Provider  albuterol (PROVENTIL HFA;VENTOLIN HFA) 108 (90 Base) MCG/ACT inhaler Inhale 2 puffs into the lungs every 6 (six) hours as needed for wheezing or shortness of breath. 04/10/17   Barry Dienes, NP  apixaban (ELIQUIS) 5 MG TABS tablet Take 1 tablet (5 mg total) by mouth every 12 (twelve) hours. 10/01/22   Theora Gianotti, NP  Cholecalciferol 125 MCG (5000  UT) CHEW Chew 5,000 Units by mouth daily.    [provider]  clonazePAM (KLONOPIN) 0.5 MG tablet Take 0.5 mg by mouth 2 (two) times daily. 11/19/22   [provider]  fexofenadine (ALLEGRA) 180 MG tablet Take 180 mg by mouth daily.    [provider]  flecainide (TAMBOCOR) 100 MG tablet Take 1 tablet (100 mg total) by mouth 2 (two) times daily. 11/26/22   Theora Gianotti, NP  furosemide (LASIX) 40 MG tablet Take 1 tablet (40 mg total) by mouth daily for 1 day. 12/08/22 12/09/22  Geradine Girt, DO  lamoTRIgine (LAMICTAL) 100 MG tablet Take 150 mg by mouth every 12 (twelve) hours.    [provider]  levothyroxine (SYNTHROID) 50 MCG tablet Take 50 mcg by mouth daily before breakfast.    [provider]  lisinopril (ZESTRIL) 40 MG tablet Take 1 tablet (40 mg total) by mouth daily. 12/14/22   Geradine Girt, DO  metFORMIN (GLUCOPHAGE) 500 MG tablet Take  1,000 mg by mouth 2 (two) times daily. 11/16/22   [provider]  montelukast (SINGULAIR) 10 MG tablet Take 10 mg by mouth at bedtime.    [provider]  Olopatadine HCl (PATADAY OP) Place 1 drop into both eyes daily.    [provider]  omeprazole (PRILOSEC) 20 MG capsule Take 20 mg by mouth every 12 (twelve) hours.    [provider]  ondansetron (ZOFRAN-ODT) 4 MG disintegrating tablet Take 1 tablet (4 mg total) by mouth every 8 (eight) hours as needed for nausea or vomiting. 12/08/22   Geradine Girt, DO  Prenatal Vit-Fe Fumarate-FA (PRENATAL MULTIVITAMIN) TABS tablet Take 1 tablet by mouth daily. With iron    [provider]  verapamil (CALAN-SR) 180 MG CR tablet Take 2 tablets (360 mg total) by mouth daily. 12/09/22   Geradine Girt, DO     Critical care provider statement:   Total critical care time: 33 minutes   Performed by: Lanney Gins MD   Critical care time was exclusive of separately billable procedures and treating other patients.    Critical care was necessary to treat or prevent imminent or life-threatening deterioration.   Critical care was time spent personally by me on the following activities: development of treatment plan with patient and/or surrogate as well as nursing, discussions with consultants, evaluation of patient's response to treatment, examination of patient, obtaining history from patient or surrogate, ordering and performing treatments and interventions, ordering and review of laboratory studies, ordering and review of radiographic studies, pulse oximetry and re-evaluation of patient's condition.    Ottie Glazier, M.D.  Pulmonary & Critical Care Medicine

## 2023-01-05 NOTE — Progress Notes (Signed)
Rounding Note    Patient Name: [name] Date of Encounter: 01/05/2023  [place] Cardiologist: Nelva Bush, MD   Subjective   [name] is feeling much better but still has some generalized weakness.  No chest pain, shortness of breath, palpitations, lightheadedness, or vision changes.  She has been weaned off glucagon after my discussion with critical care this morning.  She has remained on low-dose norepinephrine to maintain her blood pressure.  Inpatient Medications    Scheduled Meds:  apixaban  5 mg Oral Q12H   Chlorhexidine Gluconate Cloth  6 each Topical Daily   insulin aspart  0-15 Units Subcutaneous Q4H   [START ON 01/06/2023] levothyroxine  50 mcg Oral Q0600   loratadine  10 mg Oral Daily   montelukast  10 mg Oral QHS   pantoprazole  40 mg Oral Daily   Continuous Infusions:  sodium chloride Stopped (01/05/23 1209)   norepinephrine (LEVOPHED) Adult infusion 2 mcg/min (01/05/23 1300)   PRN Meds: acetaminophen, albuterol, docusate sodium, olopatadine, ondansetron (ZOFRAN) IV, polyethylene glycol   Vital Signs    Vitals:   01/05/23 1230 01/05/23 1245 01/05/23 1300 01/05/23 1400  BP: (!) 110/59 (!) 109/59 121/63 (!) 121/58  Pulse: 71 70 73 73  Resp: (!) 29 (!) 28 (!) 21 15  Temp:      TempSrc:      SpO2: 100% 100% 100% 100%  Weight:      Height:        Intake/Output Summary (Last 24 hours) at 01/05/2023 1452 Last data filed at 01/05/2023 1300 Gross per 24 hour  Intake 1312.47 ml  Output 1200 ml  Net 112.47 ml      01/05/2023    5:00 AM 01/04/2023    2:12 PM 12/07/2022   12:09 PM  Last 3 Weights  Weight (lbs) 373 lb 7.4 oz 364 lb 13.8 oz 364 lb 13.8 oz  Weight (kg) 169.4 kg 165.5 kg 165.5 kg      Telemetry    Sinus bradycardia with gradually increasing heart rate overnight, now sinus rhythm in the 70s.- Personally Reviewed  ECG   No new tracing.  Physical Exam   GEN: No acute distress.   Neck: Unable to assess JVP due to  body habitus. Cardiac: RRR, no murmurs, rubs, or gallops.  Respiratory: Clear to auscultation bilaterally. GI: Soft, nontender, non-distended  MS: 1-2+ chronic appearing bilateral calf edema; No deformity. Neuro:  Nonfocal  Psych: Normal affect   Labs    High Sensitivity Troponin:   Recent Labs  Lab 01/04/23 1410 01/04/23 1854  TROPONINIHS 4 4     Chemistry Recent Labs  Lab 01/04/23 1410 01/05/23 0340  NA 133* 135  K 4.2 3.1*  CL 101 101  CO2 25 27  GLUCOSE 118* 166*  BUN 13 15  CREATININE 0.67 0.73  CALCIUM 8.3* 8.6*  MG 1.9 1.9  GFRNONAA >60 >60  ANIONGAP 7 7    Lipids No results for input(s): "CHOL", "TRIG", "HDL", "LABVLDL", "LDLCALC", "CHOLHDL" in the last 168 hours.  Hematology Recent Labs  Lab 01/04/23 1410 01/05/23 0340  WBC 11.1* 20.7*  RBC 3.41* 3.58*  HGB 10.0* 10.5*  HCT 31.0* 31.8*  MCV 90.9 88.8  MCH 29.3 29.3  MCHC 32.3 33.0  RDW 15.7* 15.8*  PLT 284 329   Thyroid  Recent Labs  Lab 01/04/23 1854  TSH 2.710    BNPNo results for input(s): "BNP", "PROBNP" in the last 168 hours.  DDimer  Recent  Labs  Lab 01/04/23 1854  DDIMER 0.35     Radiology    ECHOCARDIOGRAM COMPLETE  Result Date: 01/05/2023    ECHOCARDIOGRAM REPORT   Patient Name:   [name] Date of Exam: 01/05/2023 Medical Rec #:  [id number]    Height:       65.0 in Accession #:    [id number]   Weight:       373.5 lb Date of Birth:  02/18/1961    BSA:          2.579 m Patient Age:    62 years     BP:           108/65 mmHg Patient Gender: F            HR:           61 bpm. Exam Location:  [facility] Procedure: 2D Echo Indications:     atrial fibrillation  History:         Patient has prior history of Echocardiogram examinations, most                  recent 08/30/2020. CHF; Risk Factors:Hypertension and Diabetes.  Sonographer:     Harvie Junior Referring Phys:  [id number] Dreama Saa NELSON Diagnosing Phys: Nelva Bush MD  Sonographer Comments: Technically difficult study due to poor echo  windows and patient is obese. Image acquisition challenging due to patient body habitus. IMPRESSIONS  1. Left ventricular ejection fraction, by estimation, is 60 to 65%. The left ventricle has normal function. Left ventricular endocardial border not optimally defined to evaluate regional wall motion. The left ventricular internal cavity size was mildly dilated. There is mild left ventricular hypertrophy. Left ventricular diastolic parameters were normal.  2. Right ventricular systolic function is normal. The right ventricular size is mildly enlarged. There is moderately elevated pulmonary artery systolic pressure.  3. Right atrial size was mildly dilated.  4. The mitral valve is grossly normal. Trivial mitral valve regurgitation.  5. The aortic valve was not well visualized. Aortic valve regurgitation is not visualized. No aortic stenosis is present.  6. The inferior vena cava is dilated in size with <50% respiratory variability, suggesting right atrial pressure of 15 mmHg. FINDINGS  Left Ventricle: Left ventricular ejection fraction, by estimation, is 60 to 65%. The left ventricle has normal function. Left ventricular endocardial border not optimally defined to evaluate regional wall motion. The left ventricular internal cavity size was mildly dilated. There is mild left ventricular hypertrophy. Left ventricular diastolic parameters were normal. Right Ventricle: The right ventricular size is mildly enlarged. No increase in right ventricular wall thickness. Right ventricular systolic function is normal. There is moderately elevated pulmonary artery systolic pressure. The tricuspid regurgitant velocity is 2.95 m/s, and with an assumed right atrial pressure of 15 mmHg, the estimated right ventricular systolic pressure is 71.6 mmHg. Left Atrium: Left atrial size was normal in size. Right Atrium: Right atrial size was mildly dilated. Pericardium: There is no evidence of pericardial effusion. Mitral Valve: The mitral  valve is grossly normal. Trivial mitral valve regurgitation. Tricuspid Valve: The tricuspid valve is not well visualized. Tricuspid valve regurgitation is trivial. Aortic Valve: The aortic valve was not well visualized. Aortic valve regurgitation is not visualized. No aortic stenosis is present. Aortic valve mean gradient measures 6.7 mmHg. Aortic valve peak gradient measures 12.2 mmHg. Aortic valve area, by VTI measures 2.20 cm. Pulmonic Valve: The pulmonic valve was not well visualized. Pulmonic valve regurgitation is trivial. No evidence  of pulmonic stenosis. Aorta: The aortic root is normal in size and structure. Pulmonary Artery: The pulmonary artery is not well seen. Venous: The inferior vena cava is dilated in size with less than 50% respiratory variability, suggesting right atrial pressure of 15 mmHg. IAS/Shunts: The interatrial septum was not well visualized.  LEFT VENTRICLE PLAX 2D LVIDd:         5.40 cm      Diastology LVIDs:         3.50 cm      LV e' medial:    9.79 cm/s LV PW:         1.10 cm      LV E/e' medial:  13.6 LV IVS:        1.10 cm      LV e' lateral:   11.90 cm/s LVOT diam:     1.80 cm      LV E/e' lateral: 11.2 LV SV:         77 LV SV Index:   30 LVOT Area:     2.54 cm                              3D Volume EF: LV Volumes (MOD)            3D EF:        61 % LV vol d, MOD A2C: 67.8 ml  LV EDV:       409 ml LV vol d, MOD A4C: 115.0 ml LV ESV:       161 ml LV vol s, MOD A2C: 34.9 ml  LV SV:        248 ml LV vol s, MOD A4C: 39.7 ml LV SV MOD A2C:     32.9 ml LV SV MOD A4C:     115.0 ml LV SV MOD BP:      56.1 ml RIGHT VENTRICLE RV Basal diam:  4.30 cm RV Mid diam:    3.30 cm RV S prime:     16.30 cm/s TAPSE (M-mode): 2.5 cm LEFT ATRIUM           Index        RIGHT ATRIUM           Index LA diam:      3.60 cm 1.40 cm/m   RA Area:     24.80 cm LA Vol (A2C): 44.8 ml 17.37 ml/m  RA Volume:   78.50 ml  30.43 ml/m LA Vol (A4C): 58.6 ml 22.72 ml/m  AORTIC VALVE                     PULMONIC  VALVE AV Area (Vmax):    2.06 cm      PV Vmax:       1.19 m/s AV Area (Vmean):   2.03 cm      PV Peak grad:  5.7 mmHg AV Area (VTI):     2.20 cm AV Vmax:           174.33 cm/s AV Vmean:          116.667 cm/s AV VTI:            0.349 m AV Peak Grad:      12.2 mmHg AV Mean Grad:      6.7 mmHg LVOT Vmax:         141.00 cm/s LVOT Vmean:  92.900 cm/s LVOT VTI:          0.302 m LVOT/AV VTI ratio: 0.86  AORTA Ao Root diam: 2.80 cm MITRAL VALVE                TRICUSPID VALVE MV Area (PHT): 4.68 cm     TR Peak grad:   34.8 mmHg MV Decel Time: 162 msec     TR Vmax:        295.00 cm/s MR Peak grad: 36.2 mmHg MR Vmax:      301.00 cm/s   SHUNTS MV E velocity: 133.00 cm/s  Systemic VTI:  0.30 m MV A velocity: 90.10 cm/s   Systemic Diam: 1.80 cm MV E/A ratio:  1.48 Harrell Gave Charlsie Fleeger MD Electronically signed by Nelva Bush MD Signature Date/Time: 01/05/2023/2:36:56 PM    Final    DG Chest Portable 1 View  Result Date: 01/04/2023 CLINICAL DATA:  Central line placement EXAM: PORTABLE CHEST 1 VIEW COMPARISON:  11/08/2022 FINDINGS: Single frontal view of the chest demonstrates an enlarged cardiac silhouette. Defibrillator pad overlies the cardiac silhouette. Right internal jugular catheter tip overlies superior vena cava. No airspace disease, effusion, or pneumothorax. No acute bony abnormality. IMPRESSION: 1. No complication after right internal jugular catheter placement. 2. Stable enlarged cardiac silhouette. Electronically Signed   By: Randa Ngo M.D.   On: 01/04/2023 18:05   CT CORONARY MORPH W/CTA COR W/SCORE W/CA W/CM &/OR WO/CM  Addendum Date: 01/04/2023   ADDENDUM REPORT: 01/04/2023 15:44 ADDENDUM: The following report is an over-read performed by radiologist Dr. Dahlia Bailiff of [facility], [place] on January 04, 2022. This over-read does not include interpretation of cardiac or coronary anatomy or pathology. The coronary CTA interpretation by the cardiologist is attached. COMPARISON:  None.  FINDINGS: Vascular: No acute non-cardiac vascular finding. Mediastinum/Nodes: No pathologically enlarged mediastinal, or hilar lymph nodes, in the visualized portions of the chest. Distal esophagus is grossly unremarkable. Lungs/Pleura: Mosaic attenuation of the lungs. No pleural effusion. No pneumothorax. Upper Abdomen: No acute abnormality. Musculoskeletal: Multilevel degenerative changes spine. IMPRESSION: Mosaic attenuation of the lungs, which can be seen in the setting of small airways disease or study related artifact. Electronically Signed   By: Dahlia Bailiff M.D.   On: 01/04/2023 15:44   Result Date: 01/04/2023 CLINICAL DATA:  Chest pain EXAM: Cardiac/Coronary  CTA TECHNIQUE: The patient was scanned on a [employer] scanner. : A prospective scan was triggered in the descending thoracic aorta. Axial non-contrast 3 mm slices were carried out through the heart. The data set was analyzed on a dedicated work station and scored using the [place]. Gantry rotation speed was 66 msecs and collimation was .6 mm. '100mg'$  of metoprolol and 0.8 mg of sl NTG was given. The 3D data set was reconstructed in 5% intervals of the 60-95 % of the R-R cycle. Diastolic phases were analyzed on a dedicated work station using MPR, MIP and VRT modes. The patient received 125 cc of contrast. FINDINGS: Aorta:  Normal size.  No calcifications.  No dissection. Aortic Valve:  Trileaflet.  No calcifications. Coronary Arteries:  Normal coronary origin.  Right dominance. RCA is a dominant artery that gives rise to PDA and PLA. There is no plaque. Left main gives rise to LAD and LCX arteries.  LM has no disease. LAD has no plaque. LCX is a non-dominant artery.  There is no plaque. Other findings: Normal pulmonary vein drainage into the left atrium. Normal left atrial appendage without a thrombus. Normal  size of the pulmonary artery. IMPRESSION: 1. Normal coronary calcium score of 0.  Patient is low risk. 2. Normal coronary  origin with right dominance. 3. No evidence of CAD. 4. CAD-RADS 0. Consider non-atherosclerotic causes of chest pain. 5. Image quality degraded by obesity related artifacts. Electronically Signed: By: Kate Sable M.D. On: 01/04/2023 15:33    Cardiac Studies   See TTE above  Patient Profile     62 y.o. female woman with history of chronic HFpEF, paroxysmal atrial fibrillation, hypertension, nonobstructive coronary artery disease, type 2 diabetes mellitus, morbid obesity, obstructive sleep apnea, and recent gastroenteritis, whom we are seeing due to symptomatic bradycardia and hypotension following cardiac CT yesterday.  Assessment & Plan    Symptomatic bradycardia and shock: Bradycardia and shock felt to be iatrogenic in the setting of beta-blocker, calcium channel blocker, flecainide, and nitroglycerin administration yesterday for coronary CTA.  Heart rates have normalized and remain pain appropriate with discontinuation of glucagon.  Patient remains slightly hypotensive on low-dose norepinephrine. -Wean norepinephrine as tolerated. -Discontinue standing IV fluids, as the patient has chronic HFpEF and appears volume overloaded based on her leg edema as well as echo findings today. -EP to evaluate patient tomorrow regarding feasibility of reinitiation of flecainide and verapamil when blood pressure allows or if alternative therapy for PAF is indicated. -Continue to hold lisinopril in the setting of hypotension.  Chronic HFpEF: Patient appears volume overloaded.  I's and O's suggest that she is even, though most IV fluid administered yesterday has not been recorded. -Minimize IV fluid, as possible, as patient has edema on exam.  Echo also shows dilated IVC suggesting elevated CVP.  Paroxysmal atrial fibrillation: Patient maintaining sinus rhythm. -Continue apixaban 5 mg twice daily. -Verapamil on hold given bradycardia and shock. -Will have EP evaluate patient tomorrow to provide  further recommendations about long-term atrial fibrillation management.     For questions or updates, please contact [place] Please consult [email or web address] for contact info under [facility].    Signed, Nelva Bush, MD  01/05/2023, 2:52 PM

## 2023-01-05 NOTE — Progress Notes (Signed)
Pt does not want to use CPAP tonight. Pt is aware that at anytime a CPAP can be made available if she changes her mind.

## 2023-01-05 NOTE — Consult Note (Signed)
PHARMACY CONSULT NOTE - ELECTROLYTES  Pharmacy Consult for Electrolyte Monitoring and Replacement   Recent Labs: Potassium (mmol/L)  Date Value  01/05/2023 3.1 (L)   Magnesium (mg/dL)  Date Value  01/05/2023 1.9   Calcium (mg/dL)  Date Value  01/05/2023 8.6 (L)   Albumin (g/dL)  Date Value  12/06/2022 3.1 (L)   Phosphorus (mg/dL)  Date Value  01/05/2023 2.4 (L)   Sodium (mmol/L)  Date Value  01/05/2023 135    Assessment  [name] is a 62 y.o. female presenting with bradycardia and hypotension. PMH significant for anxiety, HFpEF (EF 60-65%, 08/2020), GERD, morbid obesity, CAD, OSA, pAFib (on Eliquis), T2DM. Pharmacy has been consulted to monitor and replace electrolytes.  Diet: Regular  Goal of Therapy: Electrolytes WNL  Plan:  Potassium: 4.2 >> 3.1, give KCl 10 mEq IV Q1H x4 Magnesium: 1.9 >> 1.9, no replacement needed Phosphorus: 3.5 >> 2.4, no replacement needed Check BMP, Mg, Phos with AM labs  Thank you for allowing pharmacy to be a part of this patient's care.  Gretel Acre, PharmD PGY1 Pharmacy Resident 01/05/2023 7:15 AM

## 2023-01-05 NOTE — Progress Notes (Signed)
  Echocardiogram 2D Echocardiogram has been performed.  [name] 01/05/2023, 8:38 AM

## 2023-01-05 NOTE — Consult Note (Signed)
Pharmacy Antibiotic Note  [name] is a 62 y.o. female admitted on 01/04/2023 with  aspiration pneumonia .  Pharmacy has been consulted for Zosyn dosing.  Assessment: 62 yo F with PMH HTN, pAF presents for scheduled coronary CTA and subsequently became hypotensive and bradycardic prior to the CTA. Pt was transported to ED where she remained bradycardic, hypotensive, and somnolent. Patient currently in the CCU. Patient is afebrile, HR 70-90s, but tachypneic. WBCs have jumped from 11.1 >> 20.7.  Plan: Initiate Zosyn 3.375 g IV q8H Follow up culture results to assess for antibiotic optimization Monitor renal function to assess for any necessary antibiotic dosing changes   Height: '5\' 5"'$  (165.1 cm) Weight: (!) 169.4 kg (373 lb 7.4 oz) IBW/kg (Calculated) : 57  Temp (24hrs), Avg:98.2 F (36.8 C), Min:97.6 F (36.4 C), Max:98.8 F (37.1 C)  Recent Labs  Lab 01/04/23 1410 01/05/23 0340  WBC 11.1* 20.7*  CREATININE 0.67 0.73    Estimated Creatinine Clearance: 118.9 mL/min (by C-G formula based on SCr of 0.73 mg/dL).    Allergies  Allergen Reactions   Erythromycin Diarrhea    Antimicrobials this admission: Zosyn 1/16 >>   Dose adjustments this admission: N/A  Microbiology results: 1/16 COVID/Flu/RSV: Negative 1/16 BCx: sent 1/15 MRSA PCR: Negative  Thank you for allowing pharmacy to be a part of this patient's care.  Will M. Ouida Sills, PharmD PGY-1 Pharmacy Resident 01/05/2023 7:48 PM

## 2023-01-06 DIAGNOSIS — I480 Paroxysmal atrial fibrillation: Secondary | ICD-10-CM | POA: Diagnosis not present

## 2023-01-06 DIAGNOSIS — I4819 Other persistent atrial fibrillation: Secondary | ICD-10-CM | POA: Diagnosis not present

## 2023-01-06 DIAGNOSIS — I952 Hypotension due to drugs: Secondary | ICD-10-CM | POA: Diagnosis not present

## 2023-01-06 LAB — CBC WITH DIFFERENTIAL/PLATELET
Abs Immature Granulocytes: 0.09 10*3/uL — ABNORMAL HIGH (ref 0.00–0.07)
Basophils Absolute: 0.1 10*3/uL (ref 0.0–0.1)
Basophils Relative: 1 %
Eosinophils Absolute: 0.2 10*3/uL (ref 0.0–0.5)
Eosinophils Relative: 2 %
HCT: 31.2 % — ABNORMAL LOW (ref 36.0–46.0)
Hemoglobin: 10.1 g/dL — ABNORMAL LOW (ref 12.0–15.0)
Immature Granulocytes: 1 %
Lymphocytes Relative: 18 %
Lymphs Abs: 1.8 10*3/uL (ref 0.7–4.0)
MCH: 29 pg (ref 26.0–34.0)
MCHC: 32.4 g/dL (ref 30.0–36.0)
MCV: 89.7 fL (ref 80.0–100.0)
Monocytes Absolute: 0.8 10*3/uL (ref 0.1–1.0)
Monocytes Relative: 8 %
Neutro Abs: 7.2 10*3/uL (ref 1.7–7.7)
Neutrophils Relative %: 70 %
Platelets: 257 10*3/uL (ref 150–400)
RBC: 3.48 MIL/uL — ABNORMAL LOW (ref 3.87–5.11)
RDW: 16.1 % — ABNORMAL HIGH (ref 11.5–15.5)
WBC: 10.1 10*3/uL (ref 4.0–10.5)
nRBC: 0 % (ref 0.0–0.2)

## 2023-01-06 LAB — GLUCOSE, CAPILLARY
Glucose-Capillary: 105 mg/dL — ABNORMAL HIGH (ref 70–99)
Glucose-Capillary: 106 mg/dL — ABNORMAL HIGH (ref 70–99)
Glucose-Capillary: 131 mg/dL — ABNORMAL HIGH (ref 70–99)
Glucose-Capillary: 78 mg/dL (ref 70–99)
Glucose-Capillary: 92 mg/dL (ref 70–99)
Glucose-Capillary: 117 mg/dL — ABNORMAL HIGH (ref 70–99)

## 2023-01-06 LAB — BASIC METABOLIC PANEL
Anion gap: 7 (ref 5–15)
BUN: 10 mg/dL (ref 8–23)
CO2: 26 mmol/L (ref 22–32)
Calcium: 9 mg/dL (ref 8.9–10.3)
Chloride: 102 mmol/L (ref 98–111)
Creatinine, Ser: 0.59 mg/dL (ref 0.44–1.00)
GFR, Estimated: >60 mL/min (ref >60)
Glucose, Bld: 125 mg/dL — ABNORMAL HIGH (ref 70–99)
Potassium: 4.3 mmol/L (ref 3.5–5.1)
Sodium: 135 mmol/L (ref 135–145)

## 2023-01-06 LAB — MAGNESIUM: Magnesium: 1.9 mg/dL (ref 1.7–2.4)

## 2023-01-06 LAB — PHOSPHORUS: Phosphorus: 4.3 mg/dL (ref 2.5–4.6)

## 2023-01-06 LAB — T3, FREE: T3, Free: 2.8 pg/mL (ref 2.0–4.4)

## 2023-01-06 MED ORDER — CLONAZEPAM 0.5 MG PO TABS
0.5000 mg | ORAL_TABLET | Freq: Two times a day (BID) | ORAL | Status: DC
  Administered 2023-01-06 – 2023-01-08 (×5): 0.5 mg via ORAL
  Filled 2023-01-06 (×5): qty 1

## 2023-01-06 MED ORDER — LAMOTRIGINE 100 MG PO TABS
150.0000 mg | ORAL_TABLET | Freq: Two times a day (BID) | ORAL | Status: DC
  Administered 2023-01-06 – 2023-01-08 (×5): 150 mg via ORAL
  Filled 2023-01-06: qty 6
  Filled 2023-01-06 (×2): qty 2
  Filled 2023-01-06 (×2): qty 6

## 2023-01-06 NOTE — Progress Notes (Signed)
Dr. Dwyane Dee gave order to discontinue enteric precautions. C-diff negative.

## 2023-01-06 NOTE — Progress Notes (Signed)
PROGRESS NOTE    [name]  YNW:[id number] DOB: August 11, 1961 DOA: 01/04/2023  PCP: Hayden Rasmussen, MD   Brief Narrative:  This 62 years old female presented in [facility] for scheduled coronary CTA in the morning on 01/04/23.  She reports she has taken her 100 mg of flecainide and 100 mg metoprolol prior to CTA as instructed.  Upon arrival at [facility],  her initial heart rate ranged between 69-80. Cardiology was notified, Patient received an additional 10 mg IV metoprolol.  30 minutes following administration, Patient became bradycardic with heart rate in46 and hypotensive BP 75/32.  She has received IV fluid bolus, she subsequently received 0.8 mg of nitroglycerin and became more hypotensive and developed intermittent loss of consciousness.  Patient was transported to the ED, she remained hypotensive.  Patient was given 5 mg IV bolus of glucagon and 1 mg of epinephrine with brief improvement in her blood pressure and heart rate.  However immediately became hypotensive and bradycardic again therefore started on glucagon gtt.  Patient was admitted in the ICU and started on Levophed. She is now improved with weaning of glucagon gtt. and Levophed.  Patient is medically optimized and transferred to [facility] 01/06/2023  Assessment & Plan:   Principal Problem:   Hypotension Active Problems:   PAF (paroxysmal atrial fibrillation) (HCC)   Overdose of beta-adrenergic antagonist drug   Symptomatic bradycardia and hypotension possibly beta blocker toxicity: Patient was given glucagon with brief improvement in heart rate and blood pressure. She was started on glucagon infusion.  Continue telemetry monitoring. Continue epinephrine as needed and Levophed gtt. to maintain MAP above 65. Hold outpatient beta-blocker and antihypertensives for now. Echo shows LVEF 60 to 65%, RWMA not visualized. Heart rate has improved.  Blood pressure has normalized.  Levophed weaned off. Continue to hold AV nodal agents. EP evaluation  for further recommendation to what medication to continue moving forward.  Paroxysmal A-fib: Maintaining sinus rhythm. Continue Eliquis. Hold AV nodal agents.  Type 2 diabetes Continue regular insulin sliding scale. Carb modified diet.  Mild persistent asthma: Continue supplemental oxygen. Continue bronchodilators as needed.  Chronic HFp EF: Appears euvolemic today. Monitor intake output charting. Echo shows dilated IVC suggesting elevated CVP.  DVT prophylaxis: Lovenox Code Status: Full code. Family Communication: No family at bed side. Disposition Plan:   Status is: Inpatient Remains inpatient appropriate because: Admitted for symptomatic bradycardia and hypotension following beta-blocker overdose.  Now improved.    Consultants:  Cardiology  Procedures: None  Antimicrobials: None  Subjective: Patient was seen and examined at bedside.  Overnight events noted.   Patient reports doing much better.  Denies any chest pain, she appears anxious and worried.  Objective: Vitals:   01/06/23 0700 01/06/23 0800 01/06/23 1200 01/06/23 1600  BP:  128/72 (!) 140/79 (!) 141/68  Pulse: 76 90 93 87  Resp: (!) 24 15 (!) 28 17  Temp:   99.5 F (37.5 C) 99.2 F (37.3 C)  TempSrc:   Oral Oral  SpO2: 98% 100% 99% 99%  Weight:      Height:        Intake/Output Summary (Last 24 hours) at 01/06/2023 1642 Last data filed at 01/06/2023 1000 Gross per 24 hour  Intake 100.04 ml  Output 900 ml  Net -799.96 ml   Filed Weights   01/04/23 1412 01/05/23 0500 01/06/23 0500  Weight: (!) 165.5 kg (!) 169.4 kg (!) 167.9 kg    Examination:  General exam: Appears comfortable, not in any acute distress.  Deconditioned Respiratory system: CTA Bilaterally, respiratory effort normal, RR 15. Cardiovascular system: S1 & S2 heard, Irregular rhythm, no murmur. Gastrointestinal system: Abdomen is soft, non tender, non distended, BS+ Central nervous system: Alert and oriented x 3. No focal  neurological deficits. Extremities: No edema, no cyanosis, no clubbing. Skin: No rashes, lesions or ulcers Psychiatry: Judgement and insight appear normal. Mood & affect appropriate.     Data Reviewed: I have personally reviewed following labs and imaging studies  CBC: Recent Labs  Lab 01/04/23 1410 01/05/23 0340 01/06/23 0505  WBC 11.1* 20.7* 10.1  NEUTROABS 7.7  --  7.2  HGB 10.0* 10.5* 10.1*  HCT 31.0* 31.8* 31.2*  MCV 90.9 88.8 89.7  PLT 284 329 956   Basic Metabolic Panel: Recent Labs  Lab 01/04/23 1410 01/05/23 0340 01/05/23 2034 01/06/23 0505  NA 133* 135  --  135  K 4.2 3.1* 4.2 4.3  CL 101 101  --  102  CO2 25 27  --  26  GLUCOSE 118* 166*  --  125*  BUN 13 15  --  10  CREATININE 0.67 0.73  --  0.59  CALCIUM 8.3* 8.6*  --  9.0  MG 1.9 1.9 2.0 1.9  PHOS 3.5 2.4*  --  4.3   GFR: Estimated Creatinine Clearance: 118.2 mL/min (by C-G formula based on SCr of 0.59 mg/dL). Liver Function Tests: No results for input(s): "AST", "ALT", "ALKPHOS", "BILITOT", "PROT", "ALBUMIN" in the last 168 hours. No results for input(s): "LIPASE", "AMYLASE" in the last 168 hours. No results for input(s): "AMMONIA" in the last 168 hours. Coagulation Profile: No results for input(s): "INR", "PROTIME" in the last 168 hours. Cardiac Enzymes: No results for input(s): "CKTOTAL", "CKMB", "CKMBINDEX", "TROPONINI" in the last 168 hours. BNP (last 3 results) No results for input(s): "PROBNP" in the last 8760 hours. HbA1C: No results for input(s): "HGBA1C" in the last 72 hours. CBG: Recent Labs  Lab 01/05/23 2010 01/05/23 2336 01/06/23 0414 01/06/23 0801 01/06/23 1231  GLUCAP 106* 92 131* 105* 117*   Lipid Profile: No results for input(s): "CHOL", "HDL", "LDLCALC", "TRIG", "CHOLHDL", "LDLDIRECT" in the last 72 hours. Thyroid Function Tests: Recent Labs    01/04/23 1854  TSH 2.710  T3FREE 2.8   Anemia Panel: No results for input(s): "VITAMINB12", "FOLATE", "FERRITIN",  "TIBC", "IRON", "RETICCTPCT" in the last 72 hours. Sepsis Labs: Recent Labs  Lab 01/05/23 0340  PROCALCITON 2.11    Recent Results (from the past 240 hour(s))  MRSA Next Gen by PCR, Nasal     Status: None   Collection Time: 01/04/23  6:55 PM   Specimen: Nasal Mucosa; Nasal Swab  Result Value Ref Range Status   MRSA by PCR Next Gen NOT DETECTED NOT DETECTED Final    Comment: (NOTE) The GeneXpert MRSA Assay (FDA approved for NASAL specimens only), is one component of a comprehensive MRSA colonization surveillance program. It is not intended to diagnose MRSA infection nor to guide or monitor treatment for MRSA infections. Test performance is not FDA approved in patients less than 8 years old. Performed at [facility], [address] [place], [place] [address]   Culture, blood (Routine X 2) w Reflex to ID Panel     Status: None (Preliminary result)   Collection Time: 01/05/23  1:46 PM   Specimen: BLOOD LEFT ARM  Result Value Ref Range Status   Specimen Description BLOOD LEFT ARM LAC  Final   Special Requests   Final    BOTTLES DRAWN  AEROBIC AND ANAEROBIC Blood Culture results may not be optimal due to an excessive volume of blood received in culture bottles   Culture   Final    NO GROWTH < 24 HOURS Performed at [facility], [address] [place], [place] [address]    Report Status PENDING  Incomplete  Culture, blood (Routine X 2) w Reflex to ID Panel     Status: None (Preliminary result)   Collection Time: 01/05/23  2:12 PM   Specimen: BLOOD  Result Value Ref Range Status   Specimen Description BLOOD BLOOD LEFT ARM  Final   Special Requests   Final    BOTTLES DRAWN AEROBIC AND ANAEROBIC Blood Culture adequate volume   Culture   Final    NO GROWTH < 24 HOURS Performed at [facility], [address], [place], [place] [address]    Report Status PENDING  Incomplete  Resp panel by RT-PCR (RSV, Flu A&B, Covid) Anterior Nasal Swab     Status:  None   Collection Time: 01/05/23  3:39 PM   Specimen: Anterior Nasal Swab  Result Value Ref Range Status   SARS Coronavirus 2 by RT PCR NEGATIVE NEGATIVE Final    Comment: (NOTE) SARS-CoV-2 target nucleic acids are NOT DETECTED.  The SARS-CoV-2 RNA is generally detectable in upper respiratory specimens during the acute phase of infection. The lowest concentration of SARS-CoV-2 viral copies this assay can detect is 138 copies/mL. A negative result does not preclude SARS-Cov-2 infection and should not be used as the sole basis for treatment or other patient management decisions. A negative result may occur with  improper specimen collection/handling, submission of specimen other than nasopharyngeal swab, presence of viral mutation(s) within the areas targeted by this assay, and inadequate number of viral copies(<138 copies/mL). A negative result must be combined with clinical observations, patient history, and epidemiological information. The expected result is Negative.  Fact Sheet for Patients:  EntrepreneurPulse.com.au  Fact Sheet for Healthcare Providers:  IncredibleEmployment.be  This test is no t yet approved or cleared by the [place] FDA and  has been authorized for detection and/or diagnosis of SARS-CoV-2 by FDA under an Emergency Use Authorization (EUA). This EUA will remain  in effect (meaning this test can be used) for the duration of the COVID-19 declaration under Section 564(b)(1) of the Act, 21 U.S.C.section 360bbb-3(b)(1), unless the authorization is terminated  or revoked sooner.       Influenza A by PCR NEGATIVE NEGATIVE Final   Influenza B by PCR NEGATIVE NEGATIVE Final    Comment: (NOTE) The Xpert Xpress SARS-CoV-2/FLU/RSV plus assay is intended as an aid in the diagnosis of influenza from Nasopharyngeal swab specimens and should not be used as a sole basis for treatment. Nasal washings and aspirates are unacceptable  for Xpert Xpress SARS-CoV-2/FLU/RSV testing.  Fact Sheet for Patients: EntrepreneurPulse.com.au  Fact Sheet for Healthcare Providers: IncredibleEmployment.be  This test is not yet approved or cleared by the [place] FDA and has been authorized for detection and/or diagnosis of SARS-CoV-2 by FDA under an Emergency Use Authorization (EUA). This EUA will remain in effect (meaning this test can be used) for the duration of the COVID-19 declaration under Section 564(b)(1) of the Act, 21 U.S.C. section 360bbb-3(b)(1), unless the authorization is terminated or revoked.     Resp Syncytial Virus by PCR NEGATIVE NEGATIVE Final    Comment: (NOTE) Fact Sheet for Patients: EntrepreneurPulse.com.au  Fact Sheet for Healthcare Providers: IncredibleEmployment.be  This test is not yet approved  or cleared by the [place] and has been authorized for detection and/or diagnosis of SARS-CoV-2 by FDA under an Emergency Use Authorization (EUA). This EUA will remain in effect (meaning this test can be used) for the duration of the COVID-19 declaration under Section 564(b)(1) of the Act, 21 U.S.C. section 360bbb-3(b)(1), unless the authorization is terminated or revoked.  Performed at [facility], [place], [place] [address]   C Difficile Quick Screen w PCR reflex     Status: None   Collection Time: 01/05/23  7:38 PM   Specimen: STOOL  Result Value Ref Range Status   C Diff antigen NEGATIVE NEGATIVE Final   C Diff toxin NEGATIVE NEGATIVE Final   C Diff interpretation No C. difficile detected.  Final    Comment: Performed at [facility], [address] [place], [place] [address]         Radiology Studies: ECHOCARDIOGRAM COMPLETE  Result Date: 01/05/2023    ECHOCARDIOGRAM REPORT   Patient Name:   ALEGRA ROST Date of Exam: 01/05/2023 Medical Rec #:  [id number]    Height:        65.0 in Accession #:    [id number]   Weight:       373.5 lb Date of Birth:  1961/10/11    BSA:          2.579 m Patient Age:    66 years     BP:           108/65 mmHg Patient Gender: F            HR:           61 bpm. Exam Location:  [facility] Procedure: 2D Echo Indications:     atrial fibrillation  History:         Patient has prior history of Echocardiogram examinations, most                  recent 08/30/2020. CHF; Risk Factors:Hypertension and Diabetes.  Sonographer:     Harvie Junior Referring Phys:  [id number] Dreama Saa NELSON Diagnosing Phys: Nelva Bush MD  Sonographer Comments: Technically difficult study due to poor echo windows and patient is obese. Image acquisition challenging due to patient body habitus. IMPRESSIONS  1. Left ventricular ejection fraction, by estimation, is 60 to 65%. The left ventricle has normal function. Left ventricular endocardial border not optimally defined to evaluate regional wall motion. The left ventricular internal cavity size was mildly dilated. There is mild left ventricular hypertrophy. Left ventricular diastolic parameters were normal.  2. Right ventricular systolic function is normal. The right ventricular size is mildly enlarged. There is moderately elevated pulmonary artery systolic pressure.  3. Right atrial size was mildly dilated.  4. The mitral valve is grossly normal. Trivial mitral valve regurgitation.  5. The aortic valve was not well visualized. Aortic valve regurgitation is not visualized. No aortic stenosis is present.  6. The inferior vena cava is dilated in size with <50% respiratory variability, suggesting right atrial pressure of 15 mmHg. FINDINGS  Left Ventricle: Left ventricular ejection fraction, by estimation, is 60 to 65%. The left ventricle has normal function. Left ventricular endocardial border not optimally defined to evaluate regional wall motion. The left ventricular internal cavity size was mildly dilated. There is mild left ventricular  hypertrophy. Left ventricular diastolic parameters were normal. Right Ventricle: The right ventricular size is mildly enlarged. No increase in right ventricular wall thickness. Right ventricular systolic function is  normal. There is moderately elevated pulmonary artery systolic pressure. The tricuspid regurgitant velocity is 2.95 m/s, and with an assumed right atrial pressure of 15 mmHg, the estimated right ventricular systolic pressure is 72.5 mmHg. Left Atrium: Left atrial size was normal in size. Right Atrium: Right atrial size was mildly dilated. Pericardium: There is no evidence of pericardial effusion. Mitral Valve: The mitral valve is grossly normal. Trivial mitral valve regurgitation. Tricuspid Valve: The tricuspid valve is not well visualized. Tricuspid valve regurgitation is trivial. Aortic Valve: The aortic valve was not well visualized. Aortic valve regurgitation is not visualized. No aortic stenosis is present. Aortic valve mean gradient measures 6.7 mmHg. Aortic valve peak gradient measures 12.2 mmHg. Aortic valve area, by VTI measures 2.20 cm. Pulmonic Valve: The pulmonic valve was not well visualized. Pulmonic valve regurgitation is trivial. No evidence of pulmonic stenosis. Aorta: The aortic root is normal in size and structure. Pulmonary Artery: The pulmonary artery is not well seen. Venous: The inferior vena cava is dilated in size with less than 50% respiratory variability, suggesting right atrial pressure of 15 mmHg. IAS/Shunts: The interatrial septum was not well visualized.  LEFT VENTRICLE PLAX 2D LVIDd:         5.40 cm      Diastology LVIDs:         3.50 cm      LV e' medial:    9.79 cm/s LV PW:         1.10 cm      LV E/e' medial:  13.6 LV IVS:        1.10 cm      LV e' lateral:   11.90 cm/s LVOT diam:     1.80 cm      LV E/e' lateral: 11.2 LV SV:         77 LV SV Index:   30 LVOT Area:     2.54 cm                              3D Volume EF: LV Volumes (MOD)            3D EF:        61 % LV  vol d, MOD A2C: 67.8 ml  LV EDV:       409 ml LV vol d, MOD A4C: 115.0 ml LV ESV:       161 ml LV vol s, MOD A2C: 34.9 ml  LV SV:        248 ml LV vol s, MOD A4C: 39.7 ml LV SV MOD A2C:     32.9 ml LV SV MOD A4C:     115.0 ml LV SV MOD BP:      56.1 ml RIGHT VENTRICLE RV Basal diam:  4.30 cm RV Mid diam:    3.30 cm RV S prime:     16.30 cm/s TAPSE (M-mode): 2.5 cm LEFT ATRIUM           Index        RIGHT ATRIUM           Index LA diam:      3.60 cm 1.40 cm/m   RA Area:     24.80 cm LA Vol (A2C): 44.8 ml 17.37 ml/m  RA Volume:   78.50 ml  30.43 ml/m LA Vol (A4C): 58.6 ml 22.72 ml/m  AORTIC VALVE  PULMONIC VALVE AV Area (Vmax):    2.06 cm      PV Vmax:       1.19 m/s AV Area (Vmean):   2.03 cm      PV Peak grad:  5.7 mmHg AV Area (VTI):     2.20 cm AV Vmax:           174.33 cm/s AV Vmean:          116.667 cm/s AV VTI:            0.349 m AV Peak Grad:      12.2 mmHg AV Mean Grad:      6.7 mmHg LVOT Vmax:         141.00 cm/s LVOT Vmean:        92.900 cm/s LVOT VTI:          0.302 m LVOT/AV VTI ratio: 0.86  AORTA Ao Root diam: 2.80 cm MITRAL VALVE                TRICUSPID VALVE MV Area (PHT): 4.68 cm     TR Peak grad:   34.8 mmHg MV Decel Time: 162 msec     TR Vmax:        295.00 cm/s MR Peak grad: 36.2 mmHg MR Vmax:      301.00 cm/s   SHUNTS MV E velocity: 133.00 cm/s  Systemic VTI:  0.30 m MV A velocity: 90.10 cm/s   Systemic Diam: 1.80 cm MV E/A ratio:  1.48 Harrell Gave End MD Electronically signed by Nelva Bush MD Signature Date/Time: 01/05/2023/2:36:56 PM    Final    DG Chest Portable 1 View  Result Date: 01/04/2023 CLINICAL DATA:  Central line placement EXAM: PORTABLE CHEST 1 VIEW COMPARISON:  11/08/2022 FINDINGS: Single frontal view of the chest demonstrates an enlarged cardiac silhouette. Defibrillator pad overlies the cardiac silhouette. Right internal jugular catheter tip overlies superior vena cava. No airspace disease, effusion, or pneumothorax. No acute bony abnormality.  IMPRESSION: 1. No complication after right internal jugular catheter placement. 2. Stable enlarged cardiac silhouette. Electronically Signed   By: Randa Ngo M.D.   On: 01/04/2023 18:05    Scheduled Meds:  apixaban  5 mg Oral Q12H   Chlorhexidine Gluconate Cloth  6 each Topical Daily   clonazePAM  0.5 mg Oral BID   insulin aspart  0-15 Units Subcutaneous Q4H   lamoTRIgine  150 mg Oral BID   levothyroxine  50 mcg Oral Q0600   loratadine  10 mg Oral Daily   montelukast  10 mg Oral QHS   pantoprazole  40 mg Oral Daily   Continuous Infusions:  sodium chloride Stopped (01/05/23 1541)   piperacillin-tazobactam (ZOSYN)  IV 3.375 g (01/06/23 1438)     LOS: 2 days    Time spent: 50 mins    Amiee Wiley, MD [facility]   If 7PM-7AM, please contact night-coverage

## 2023-01-06 NOTE — Consult Note (Signed)
ELECTROPHYSIOLOGY CONSULT NOTE    Patient ID: [name] MRN: [id number], DOB/AGE: 62-Apr-1962 62 y.o.  Admit date: 01/04/2023 Date of Consult: 01/06/2023  Primary Physician: Hayden Rasmussen, MD Primary Cardiologist: Nelva Bush, MD  Electrophysiologist: none, new to  Dr. Quentin Ore   Referring Provider: '@ATTENDING'$ @  Patient Profile: [name] is a 62 y.o. female with a history of chronic HFpEF, parox Afib, HTN, CAD (nonobstructive), DM, obesity, OSA who is being seen today for the evaluation of AF at the request of Dr. Dwyane Dee.  HPI:  [name] is a 62 y.o. female with above past medical history. She was in her usual state of health and presented for coronary CTA. Her parox afib has been well-controlled on flecainide and verapamil for many years. Oletta Cohn, on the morning of her imaging, she took her usual flecainide and verapamil doses. Two hours prior to CTA, she took '100mg'$  lopressor as instructed. At the CTA procedure, her pulse was greater than 60, so she was given an additional '10mg'$  IV lopressor. She then became bradycardic at 46 and hypotensive. She was given IV fluids and nitro and became more hypotensive with some loss of consciousness. She was then transported to the ER and found to be in a junctional rhythm in the 30s.   She was admitted to the ICU, briefly on epinephrine, switched to norepi and has since been weaned off. Today, she feels relatively well and is ready to leave hospital.  EP has been asked to weigh in on when to resume flecainide and verapamil for parox afib, if at all.    Labs Potassium4.3 (01/17 0505) Magnesium  1.9 (01/17 0505) Creatinine, ser  0.59 (01/17 0505) PLT  257 (01/17 0505) HGB  10.1* (01/17 0505) WBC 10.1 (01/17 0505) Troponin I (High Sensitivity)4 (01/15 1854).     Past Medical History:  Diagnosis Date   Anxiety 08/23/2014   Chronic heart failure with preserved ejection fraction (HFpEF) ([place])    a. 02/2000 MUGA: EF 68%; b. 06/2005  Echo: EF 55-65%; c. 06/2017 Echo: EF 60-65%; d. 08/2020 Echo: EF 60-65%, no rwma, nl RV fxn. Mild BAE. Mild MR.   Current moderate episode of major depressive disorder without prior episode ([place]) 12/31/2017   Dependent edema 10/05/2011   DNR (do not resuscitate) 12/06/2022   GERD (gastroesophageal reflux disease) 12/22/1999   Formatting of this note might be different from the original. Controlled by OTC meds   History of breast cancer 10/05/2011   Hypertension 10/05/2011   Long term current use of anticoagulant therapy 08/29/2014   Mild persistent asthma without complication 09/73/5329   Morbid obesity with BMI of 60.0-69.9, adult ([place]) 09/06/2018   Non-obstructive CAD (coronary artery disease)    a. 12/2017 Lexiscan PET/CT: mid anterior, apical lateral, and apical ischemia; b. 01/2018 Cath: LM nl, LAD & LCX 10-30% diff dzs throughout, RCA min irregs (<10%)-->Med rx.   Obstructive sleep apnea, adult 09/06/2018   Paroxysmal atrial fibrillation ([place]) 04/21/2019   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/2014   Type 2 diabetes mellitus without complication, without long-term current use of insulin ([place]) 04/21/2019   Urinary bladder incontinence 06/13/2013     Surgical History:  Past Surgical History:  Procedure Laterality Date   CARDIAC CATHETERIZATION  02/16/2018   No stents;Dr. Collene Mares, [place] [facility]   COLONOSCOPY WITH PROPOFOL N/A 11/28/2021   Procedure: COLONOSCOPY WITH PROPOFOL;  Surgeon: Carol Ada, MD;  Location: [facility] END[facility]COPY;  Service: Endoscopy;  Laterality: N/A;   FINGER  SURGERY Right    MASTECTOMY Right      Medications Prior to Admission  Medication Sig Dispense Refill Last Dose   albuterol (PROVENTIL HFA;VENTOLIN HFA) 108 (90 Base) MCG/ACT inhaler Inhale 2 puffs into the lungs every 6 (six) hours as needed for wheezing or shortness of breath. 1 Inhaler 2 prn   apixaban (ELIQUIS) 5 MG TABS tablet Take 1 tablet (5 mg total) by mouth every 12 (twelve) hours. 60  tablet 11 01/04/2023 at 0830   Cholecalciferol 125 MCG (5000 UT) CHEW Chew 5,000 Units by mouth daily.   01/04/2023   clonazePAM (KLONOPIN) 0.5 MG tablet Take 0.5 mg by mouth 2 (two) times daily.   01/04/2023 at 0830   fexofenadine (ALLEGRA) 180 MG tablet Take 180 mg by mouth daily.   01/03/2023   flecainide (TAMBOCOR) 100 MG tablet Take 1 tablet (100 mg total) by mouth 2 (two) times daily. 180 tablet 3 01/04/2023 at 1040   lamoTRIgine (LAMICTAL) 100 MG tablet Take 150 mg by mouth every 12 (twelve) hours.   01/04/2023 at 1040   levothyroxine (SYNTHROID) 50 MCG tablet Take 50 mcg by mouth daily before breakfast.   01/04/2023 at 0830   lisinopril (ZESTRIL) 40 MG tablet Take 1 tablet (40 mg total) by mouth daily.   01/04/2023 at 1040   metFORMIN (GLUCOPHAGE) 500 MG tablet Take 1,000 mg by mouth 2 (two) times daily.   01/03/2023 at 1700   montelukast (SINGULAIR) 10 MG tablet Take 10 mg by mouth at bedtime.   01/03/2023   Olopatadine HCl (PATADAY OP) Place 1 drop into both eyes daily.   prn   omeprazole (PRILOSEC) 20 MG capsule Take 20 mg by mouth every 12 (twelve) hours.   01/04/2023 at 0840   promethazine (PHENERGAN) 12.5 MG tablet Take 12.5 mg by mouth every 8 (eight) hours as needed for nausea or vomiting.   prn   verapamil (CALAN-SR) 180 MG CR tablet Take 2 tablets (360 mg total) by mouth daily. 180 tablet 3 01/04/2023 at 1040   furosemide (LASIX) 40 MG tablet Take 1 tablet (40 mg total) by mouth daily for 1 day. 30 tablet      Inpatient Medications:   apixaban  5 mg Oral Q12H   Chlorhexidine Gluconate Cloth  6 each Topical Daily   insulin aspart  0-15 Units Subcutaneous Q4H   levothyroxine  50 mcg Oral Q0600   loratadine  10 mg Oral Daily   montelukast  10 mg Oral QHS   pantoprazole  40 mg Oral Daily    Allergies:  Allergies  Allergen Reactions   Erythromycin Diarrhea    Social History   Socioeconomic History   Marital status: Married    Spouse name: Not on file   Number of children: Not  on file   Years of education: Not on file   Highest education level: Not on file  Occupational History   Not on file  Tobacco Use   Smoking status: Former    Packs/day: 0.30    Years: 4.00    Total pack years: 1.20    Types: Cigarettes    Quit date: 12/22/1983    Years since quitting: 39.0   Smokeless tobacco: Never  Vaping Use   Vaping Use: Never used  Substance and Sexual Activity   Alcohol use: Yes    Comment: "not enough"   Drug use: No   Sexual activity: Not on file  Other Topics Concern   Not on file  Social History Narrative  Not on file   Social Determinants of Health   Financial Resource Strain: Not on file  Food Insecurity: No Food Insecurity (01/04/2023)   Hunger Vital Sign    Worried About Running Out of Food in the Last Year: Never true    Ran Out of Food in the Last Year: Never true  Transportation Needs: No Transportation Needs (01/04/2023)   PRAPARE - Hydrologist (Medical): No    Lack of Transportation (Non-Medical): No  Physical Activity: Not on file  Stress: Not on file  Social Connections: Not on file  Intimate Partner Violence: Not At Risk (01/04/2023)   Humiliation, Afraid, Rape, and Kick questionnaire    Fear of Current or Ex-Partner: No    Emotionally Abused: No    Physically Abused: No    Sexually Abused: No     Family History  Problem Relation Age of Onset   Heart Problems Mother    Hypertension Mother    Angina Mother    Atrial fibrillation Mother    Heart Problems Father    Hypertension Father    Kidney Stones Sister    Kidney cancer Neg Hx    Prostate cancer Neg Hx    Bladder Cancer Neg Hx      Review of Systems: All other systems reviewed and are otherwise negative except as noted above.  Physical Exam: Vitals:   01/06/23 0500 01/06/23 0600 01/06/23 0700 01/06/23 0800  BP:    128/72  Pulse: 76 71 76 90  Resp: (!) 27 (!) 25 (!) 24 15  Temp:      TempSrc:      SpO2: 95% 95% 98% 100%  Weight:  (!) 167.9 kg     Height:        GEN- The patient is well appearing, alert and oriented x 3 today.   HEENT: normocephalic, atraumatic; sclera clear, conjunctiva pink; hearing intact; oropharynx clear; neck supple Lungs- Clear to ausculation bilaterally, normal work of breathing.  No wheezes, rales, rhonchi Heart- Regular rate and rhythm, no murmurs, rubs or gallops GI- soft, obese, non-tender, non-distended, bowel sounds present Extremities- bilat lower edema MS- no significant deformity or atrophy Skin- warm and dry, no rash or lesion Psych- euthymic mood, full affect Neuro- strength and sensation are intact   Radiology/Studies: ECHOCARDIOGRAM COMPLETE  Result Date: 01/05/2023    ECHOCARDIOGRAM REPORT   Patient Name:   TYSHANA NISHIDA Date of Exam: 01/05/2023 Medical Rec #:  [id number]    Height:       65.0 in Accession #:    [id number]   Weight:       373.5 lb Date of Birth:  04/08/1961    BSA:          2.579 m Patient Age:    24 years     BP:           108/65 mmHg Patient Gender: F            HR:           61 bpm. Exam Location:  [facility] Procedure: 2D Echo Indications:     atrial fibrillation  History:         Patient has prior history of Echocardiogram examinations, most                  recent 08/30/2020. CHF; Risk Factors:Hypertension and Diabetes.  Sonographer:     Harvie Junior Referring Phys:  [id number] [place] Diagnosing Phys:  Nelva Bush MD  Sonographer Comments: Technically difficult study due to poor echo windows and patient is obese. Image acquisition challenging due to patient body habitus. IMPRESSIONS  1. Left ventricular ejection fraction, by estimation, is 60 to 65%. The left ventricle has normal function. Left ventricular endocardial border not optimally defined to evaluate regional wall motion. The left ventricular internal cavity size was mildly dilated. There is mild left ventricular hypertrophy. Left ventricular diastolic parameters were normal.  2. Right ventricular systolic  function is normal. The right ventricular size is mildly enlarged. There is moderately elevated pulmonary artery systolic pressure.  3. Right atrial size was mildly dilated.  4. The mitral valve is grossly normal. Trivial mitral valve regurgitation.  5. The aortic valve was not well visualized. Aortic valve regurgitation is not visualized. No aortic stenosis is present.  6. The inferior vena cava is dilated in size with <50% respiratory variability, suggesting right atrial pressure of 15 mmHg. FINDINGS  Left Ventricle: Left ventricular ejection fraction, by estimation, is 60 to 65%. The left ventricle has normal function. Left ventricular endocardial border not optimally defined to evaluate regional wall motion. The left ventricular internal cavity size was mildly dilated. There is mild left ventricular hypertrophy. Left ventricular diastolic parameters were normal. Right Ventricle: The right ventricular size is mildly enlarged. No increase in right ventricular wall thickness. Right ventricular systolic function is normal. There is moderately elevated pulmonary artery systolic pressure. The tricuspid regurgitant velocity is 2.95 m/s, and with an assumed right atrial pressure of 15 mmHg, the estimated right ventricular systolic pressure is 16.1 mmHg. Left Atrium: Left atrial size was normal in size. Right Atrium: Right atrial size was mildly dilated. Pericardium: There is no evidence of pericardial effusion. Mitral Valve: The mitral valve is grossly normal. Trivial mitral valve regurgitation. Tricuspid Valve: The tricuspid valve is not well visualized. Tricuspid valve regurgitation is trivial. Aortic Valve: The aortic valve was not well visualized. Aortic valve regurgitation is not visualized. No aortic stenosis is present. Aortic valve mean gradient measures 6.7 mmHg. Aortic valve peak gradient measures 12.2 mmHg. Aortic valve area, by VTI measures 2.20 cm. Pulmonic Valve: The pulmonic valve was not well  visualized. Pulmonic valve regurgitation is trivial. No evidence of pulmonic stenosis. Aorta: The aortic root is normal in size and structure. Pulmonary Artery: The pulmonary artery is not well seen. Venous: The inferior vena cava is dilated in size with less than 50% respiratory variability, suggesting right atrial pressure of 15 mmHg. IAS/Shunts: The interatrial septum was not well visualized.  LEFT VENTRICLE PLAX 2D LVIDd:         5.40 cm      Diastology LVIDs:         3.50 cm      LV e' medial:    9.79 cm/s LV PW:         1.10 cm      LV E/e' medial:  13.6 LV IVS:        1.10 cm      LV e' lateral:   11.90 cm/s LVOT diam:     1.80 cm      LV E/e' lateral: 11.2 LV SV:         77 LV SV Index:   30 LVOT Area:     2.54 cm                              3D Volume EF: LV  Volumes (MOD)            3D EF:        61 % LV vol d, MOD A2C: 67.8 ml  LV EDV:       409 ml LV vol d, MOD A4C: 115.0 ml LV ESV:       161 ml LV vol s, MOD A2C: 34.9 ml  LV SV:        248 ml LV vol s, MOD A4C: 39.7 ml LV SV MOD A2C:     32.9 ml LV SV MOD A4C:     115.0 ml LV SV MOD BP:      56.1 ml RIGHT VENTRICLE RV Basal diam:  4.30 cm RV Mid diam:    3.30 cm RV S prime:     16.30 cm/s TAPSE (M-mode): 2.5 cm LEFT ATRIUM           Index        RIGHT ATRIUM           Index LA diam:      3.60 cm 1.40 cm/m   RA Area:     24.80 cm LA Vol (A2C): 44.8 ml 17.37 ml/m  RA Volume:   78.50 ml  30.43 ml/m LA Vol (A4C): 58.6 ml 22.72 ml/m  AORTIC VALVE                     PULMONIC VALVE AV Area (Vmax):    2.06 cm      PV Vmax:       1.19 m/s AV Area (Vmean):   2.03 cm      PV Peak grad:  5.7 mmHg AV Area (VTI):     2.20 cm AV Vmax:           174.33 cm/s AV Vmean:          116.667 cm/s AV VTI:            0.349 m AV Peak Grad:      12.2 mmHg AV Mean Grad:      6.7 mmHg LVOT Vmax:         141.00 cm/s LVOT Vmean:        92.900 cm/s LVOT VTI:          0.302 m LVOT/AV VTI ratio: 0.86  AORTA Ao Root diam: 2.80 cm MITRAL VALVE                TRICUSPID VALVE MV  Area (PHT): 4.68 cm     TR Peak grad:   34.8 mmHg MV Decel Time: 162 msec     TR Vmax:        295.00 cm/s MR Peak grad: 36.2 mmHg MR Vmax:      301.00 cm/s   SHUNTS MV E velocity: 133.00 cm/s  Systemic VTI:  0.30 m MV A velocity: 90.10 cm/s   Systemic Diam: 1.80 cm MV E/A ratio:  1.48 Harrell Gave End MD Electronically signed by Nelva Bush MD Signature Date/Time: 01/05/2023/2:36:56 PM    Final    DG Chest Portable 1 View  Result Date: 01/04/2023 CLINICAL DATA:  Central line placement EXAM: PORTABLE CHEST 1 VIEW COMPARISON:  11/08/2022 FINDINGS: Single frontal view of the chest demonstrates an enlarged cardiac silhouette. Defibrillator pad overlies the cardiac silhouette. Right internal jugular catheter tip overlies superior vena cava. No airspace disease, effusion, or pneumothorax. No acute bony abnormality. IMPRESSION: 1. No complication after right internal jugular catheter placement. 2. Stable enlarged  cardiac silhouette. Electronically Signed   By: Randa Ngo M.D.   On: 01/04/2023 18:05   CT CORONARY MORPH W/CTA COR W/SCORE W/CA W/CM &/OR WO/CM  Addendum Date: 01/04/2023   ADDENDUM REPORT: 01/04/2023 15:44 ADDENDUM: The following report is an over-read performed by radiologist Dr. Dahlia Bailiff of [facility], [place] on January 04, 2022. This over-read does not include interpretation of cardiac or coronary anatomy or pathology. The coronary CTA interpretation by the cardiologist is attached. COMPARISON:  None. FINDINGS: Vascular: No acute non-cardiac vascular finding. Mediastinum/Nodes: No pathologically enlarged mediastinal, or hilar lymph nodes, in the visualized portions of the chest. Distal esophagus is grossly unremarkable. Lungs/Pleura: Mosaic attenuation of the lungs. No pleural effusion. No pneumothorax. Upper Abdomen: No acute abnormality. Musculoskeletal: Multilevel degenerative changes spine. IMPRESSION: Mosaic attenuation of the lungs, which can be seen in the setting of small  airways disease or study related artifact. Electronically Signed   By: Dahlia Bailiff M.D.   On: 01/04/2023 15:44   Result Date: 01/04/2023 CLINICAL DATA:  Chest pain EXAM: Cardiac/Coronary  CTA TECHNIQUE: The patient was scanned on a [employer] scanner. : A prospective scan was triggered in the descending thoracic aorta. Axial non-contrast 3 mm slices were carried out through the heart. The data set was analyzed on a dedicated work station and scored using the [place]. Gantry rotation speed was 66 msecs and collimation was .6 mm. '100mg'$  of metoprolol and 0.8 mg of sl NTG was given. The 3D data set was reconstructed in 5% intervals of the 60-95 % of the R-R cycle. Diastolic phases were analyzed on a dedicated work station using MPR, MIP and VRT modes. The patient received 125 cc of contrast. FINDINGS: Aorta:  Normal size.  No calcifications.  No dissection. Aortic Valve:  Trileaflet.  No calcifications. Coronary Arteries:  Normal coronary origin.  Right dominance. RCA is a dominant artery that gives rise to PDA and PLA. There is no plaque. Left main gives rise to LAD and LCX arteries.  LM has no disease. LAD has no plaque. LCX is a non-dominant artery.  There is no plaque. Other findings: Normal pulmonary vein drainage into the left atrium. Normal left atrial appendage without a thrombus. Normal size of the pulmonary artery. IMPRESSION: 1. Normal coronary calcium score of 0.  Patient is low risk. 2. Normal coronary origin with right dominance. 3. No evidence of CAD. 4. CAD-RADS 0. Consider non-atherosclerotic causes of chest pain. 5. Image quality degraded by obesity related artifacts. Electronically Signed: By: Kate Sable M.D. On: 01/04/2023 15:33    EKG:01/04/23 at 1425: junctional bradycardia, rate 37 (personally reviewed)  TELEMETRY: NSR, rate 70+ (personally reviewed)   Assessment/Plan: #) Paroxysmal Afib #) chronic HFpEF Patient has been stable on flecainide and verapamil  for many years from an AF standpoint. Unfortunately, she has required daily lasix and has had admissions for fluid overload requiring IV lasix and PRN PO increases in lasix No longer felt to be a good candidate for flecainide  Thankfully, patient is in SR now Not an ablation candidate given BMI Will have her follow-up in AF clinic to discuss additional AAD options, would favor tikosyn over amiodarone given young age She mentions being very anxious re: AF. We discussed a referral to card psych and she is agreeable to this.  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  Therefore, the patient's annual risk of stroke is 4.8 % OAC - Eliquis '5mg'$  BID, appropriately dosed   Dr. Quentin Ore has seen    For questions or updates, please contact [facility] Please consult [email or web address] for contact info under Cardiology/STEMI.  Signed, Mamie Levers, NP  01/06/2023 9:04 AM

## 2023-01-06 NOTE — Consult Note (Signed)
Pharmacy Antibiotic Note  [name] is a 62 y.o. female admitted on 01/04/2023 with aspiration pneumonia. PMH significant for HTN, pAF. She presented for scheduled coronary CTA and subsequently became hypotensive and bradycardic prior to the CTA. Patient was transported to ED where she remained bradycardic, hypotensive, and somnolent. Patient currently in the CCU. Pharmacy has been consulted for Zosyn dosing.  Plan: Day 2 of antibiotics Continue Zosyn 3.375 g IV Q8H Continue to monitor renal function and follow culture results   Height: '5\' 5"'$  (165.1 cm) Weight: (!) 167.9 kg (370 lb 2.4 oz) IBW/kg (Calculated) : 57  Temp (24hrs), Avg:98.9 F (37.2 C), Min:98.3 F (36.8 C), Max:99.3 F (37.4 C)  Recent Labs  Lab 01/04/23 1410 01/05/23 0340 01/06/23 0505  WBC 11.1* 20.7* 10.1  CREATININE 0.67 0.73 0.59     Estimated Creatinine Clearance: 118.2 mL/min (by C-G formula based on SCr of 0.59 mg/dL).    Allergies  Allergen Reactions   Erythromycin Diarrhea    Antimicrobials this admission: Zosyn 1/16 >>   Dose adjustments this admission: N/A  Microbiology results: 1/16 COVID/Flu/RSV: negative 1/16 BCx: NG<24H 1/15 MRSA PCR: negative  Thank you for allowing pharmacy to be a part of this patient's care.  Gretel Acre, PharmD PGY1 Pharmacy Resident 01/06/2023 9:21 AM

## 2023-01-06 NOTE — Progress Notes (Signed)
Rounding Note    Patient Name: [name] Date of Encounter: 01/06/2023  [place] Cardiologist: Nelva Bush, MD   Subjective   Patient seen on AM rounds. Denies any chest pain or shortness of breath. Remains in sinus on telemetry. Continues to have generalized weakness.   Inpatient Medications    Scheduled Meds:  apixaban  5 mg Oral Q12H   Chlorhexidine Gluconate Cloth  6 each Topical Daily   clonazePAM  0.5 mg Oral BID   insulin aspart  0-15 Units Subcutaneous Q4H   levothyroxine  50 mcg Oral Q0600   loratadine  10 mg Oral Daily   montelukast  10 mg Oral QHS   pantoprazole  40 mg Oral Daily   Continuous Infusions:  sodium chloride Stopped (01/05/23 1541)   piperacillin-tazobactam (ZOSYN)  IV Stopped (01/06/23 0912)   PRN Meds: acetaminophen, albuterol, docusate sodium, olopatadine, ondansetron (ZOFRAN) IV, polyethylene glycol   Vital Signs    Vitals:   01/06/23 0500 01/06/23 0600 01/06/23 0700 01/06/23 0800  BP:    128/72  Pulse: 76 71 76 90  Resp: (!) 27 (!) 25 (!) 24 15  Temp:      TempSrc:      SpO2: 95% 95% 98% 100%  Weight: (!) 167.9 kg     Height:        Intake/Output Summary (Last 24 hours) at 01/06/2023 1135 Last data filed at 01/06/2023 1000 Gross per 24 hour  Intake 348.56 ml  Output 900 ml  Net -551.44 ml      01/06/2023    5:00 AM 01/05/2023    5:00 AM 01/04/2023    2:12 PM  Last 3 Weights  Weight (lbs) 370 lb 2.4 oz 373 lb 7.4 oz 364 lb 13.8 oz  Weight (kg) 167.9 kg 169.4 kg 165.5 kg      Telemetry    Sinus rates of 70-80 - Personally Reviewed  ECG    No new tracings - Personally Reviewed  Physical Exam   GEN: No acute distress.   Neck: No JVD Cardiac: RRR, !/VI systolic murmur,without rubs, or gallops.  Respiratory: Clear to auscultation bilaterally. Respirations are unlabored on room air GI: Soft, nontender, non-distended  MS: 1+ edema; No deformity. Neuro:  Nonfocal  Psych: Normal affect   Labs     High Sensitivity Troponin:   Recent Labs  Lab 01/04/23 1410 01/04/23 1854  TROPONINIHS 4 4     Chemistry Recent Labs  Lab 01/04/23 1410 01/05/23 0340 01/05/23 2034 01/06/23 0505  NA 133* 135  --  135  K 4.2 3.1* 4.2 4.3  CL 101 101  --  102  CO2 25 27  --  26  GLUCOSE 118* 166*  --  125*  BUN 13 15  --  10  CREATININE 0.67 0.73  --  0.59  CALCIUM 8.3* 8.6*  --  9.0  MG 1.9 1.9 2.0 1.9  GFRNONAA >60 >60  --  >60  ANIONGAP 7 7  --  7    Lipids No results for input(s): "CHOL", "TRIG", "HDL", "LABVLDL", "LDLCALC", "CHOLHDL" in the last 168 hours.  Hematology Recent Labs  Lab 01/04/23 1410 01/05/23 0340 01/06/23 0505  WBC 11.1* 20.7* 10.1  RBC 3.41* 3.58* 3.48*  HGB 10.0* 10.5* 10.1*  HCT 31.0* 31.8* 31.2*  MCV 90.9 88.8 89.7  MCH 29.3 29.3 29.0  MCHC 32.3 33.0 32.4  RDW 15.7* 15.8* 16.1*  PLT 284 329 257   Thyroid  Recent Labs  Lab 01/04/23 1854  TSH 2.710    BNPNo results for input(s): "BNP", "PROBNP" in the last 168 hours.  DDimer  Recent Labs  Lab 01/04/23 1854  DDIMER 0.35     Radiology    ECHOCARDIOGRAM COMPLETE  Result Date: 01/05/2023    ECHOCARDIOGRAM REPORT   Patient Name:   [name] Date of Exam: 01/05/2023 Medical Rec #:  [id number]    Height:       65.0 in Accession #:    [id number]   Weight:       373.5 lb Date of Birth:  May 20, 1961    BSA:          2.579 m Patient Age:    62 years     BP:           108/65 mmHg Patient Gender: F            HR:           61 bpm. Exam Location:  [facility] Procedure: 2D Echo Indications:     atrial fibrillation  History:         Patient has prior history of Echocardiogram examinations, most                  recent 08/30/2020. CHF; Risk Factors:Hypertension and Diabetes.  Sonographer:     Harvie Junior Referring Phys:  [id number] Dreama Saa NELSON Diagnosing Phys: Nelva Bush MD  Sonographer Comments: Technically difficult study due to poor echo windows and patient is obese. Image acquisition challenging due to patient body  habitus. IMPRESSIONS  1. Left ventricular ejection fraction, by estimation, is 60 to 65%. The left ventricle has normal function. Left ventricular endocardial border not optimally defined to evaluate regional wall motion. The left ventricular internal cavity size was mildly dilated. There is mild left ventricular hypertrophy. Left ventricular diastolic parameters were normal.  2. Right ventricular systolic function is normal. The right ventricular size is mildly enlarged. There is moderately elevated pulmonary artery systolic pressure.  3. Right atrial size was mildly dilated.  4. The mitral valve is grossly normal. Trivial mitral valve regurgitation.  5. The aortic valve was not well visualized. Aortic valve regurgitation is not visualized. No aortic stenosis is present.  6. The inferior vena cava is dilated in size with <50% respiratory variability, suggesting right atrial pressure of 15 mmHg. FINDINGS  Left Ventricle: Left ventricular ejection fraction, by estimation, is 60 to 65%. The left ventricle has normal function. Left ventricular endocardial border not optimally defined to evaluate regional wall motion. The left ventricular internal cavity size was mildly dilated. There is mild left ventricular hypertrophy. Left ventricular diastolic parameters were normal. Right Ventricle: The right ventricular size is mildly enlarged. No increase in right ventricular wall thickness. Right ventricular systolic function is normal. There is moderately elevated pulmonary artery systolic pressure. The tricuspid regurgitant velocity is 2.95 m/s, and with an assumed right atrial pressure of 15 mmHg, the estimated right ventricular systolic pressure is 68.1 mmHg. Left Atrium: Left atrial size was normal in size. Right Atrium: Right atrial size was mildly dilated. Pericardium: There is no evidence of pericardial effusion. Mitral Valve: The mitral valve is grossly normal. Trivial mitral valve regurgitation. Tricuspid Valve: The  tricuspid valve is not well visualized. Tricuspid valve regurgitation is trivial. Aortic Valve: The aortic valve was not well visualized. Aortic valve regurgitation is not visualized. No aortic stenosis is present. Aortic valve mean gradient measures 6.7 mmHg. Aortic valve peak gradient measures 12.2 mmHg.  Aortic valve area, by VTI measures 2.20 cm. Pulmonic Valve: The pulmonic valve was not well visualized. Pulmonic valve regurgitation is trivial. No evidence of pulmonic stenosis. Aorta: The aortic root is normal in size and structure. Pulmonary Artery: The pulmonary artery is not well seen. Venous: The inferior vena cava is dilated in size with less than 50% respiratory variability, suggesting right atrial pressure of 15 mmHg. IAS/Shunts: The interatrial septum was not well visualized.  LEFT VENTRICLE PLAX 2D LVIDd:         5.40 cm      Diastology LVIDs:         3.50 cm      LV e' medial:    9.79 cm/s LV PW:         1.10 cm      LV E/e' medial:  13.6 LV IVS:        1.10 cm      LV e' lateral:   11.90 cm/s LVOT diam:     1.80 cm      LV E/e' lateral: 11.2 LV SV:         77 LV SV Index:   30 LVOT Area:     2.54 cm                              3D Volume EF: LV Volumes (MOD)            3D EF:        61 % LV vol d, MOD A2C: 67.8 ml  LV EDV:       409 ml LV vol d, MOD A4C: 115.0 ml LV ESV:       161 ml LV vol s, MOD A2C: 34.9 ml  LV SV:        248 ml LV vol s, MOD A4C: 39.7 ml LV SV MOD A2C:     32.9 ml LV SV MOD A4C:     115.0 ml LV SV MOD BP:      56.1 ml RIGHT VENTRICLE RV Basal diam:  4.30 cm RV Mid diam:    3.30 cm RV S prime:     16.30 cm/s TAPSE (M-mode): 2.5 cm LEFT ATRIUM           Index        RIGHT ATRIUM           Index LA diam:      3.60 cm 1.40 cm/m   RA Area:     24.80 cm LA Vol (A2C): 44.8 ml 17.37 ml/m  RA Volume:   78.50 ml  30.43 ml/m LA Vol (A4C): 58.6 ml 22.72 ml/m  AORTIC VALVE                     PULMONIC VALVE AV Area (Vmax):    2.06 cm      PV Vmax:       1.19 m/s AV Area (Vmean):   2.03  cm      PV Peak grad:  5.7 mmHg AV Area (VTI):     2.20 cm AV Vmax:           174.33 cm/s AV Vmean:          116.667 cm/s AV VTI:            0.349 m AV Peak Grad:      12.2 mmHg AV Mean Grad:  6.7 mmHg LVOT Vmax:         141.00 cm/s LVOT Vmean:        92.900 cm/s LVOT VTI:          0.302 m LVOT/AV VTI ratio: 0.86  AORTA Ao Root diam: 2.80 cm MITRAL VALVE                TRICUSPID VALVE MV Area (PHT): 4.68 cm     TR Peak grad:   34.8 mmHg MV Decel Time: 162 msec     TR Vmax:        295.00 cm/s MR Peak grad: 36.2 mmHg MR Vmax:      301.00 cm/s   SHUNTS MV E velocity: 133.00 cm/s  Systemic VTI:  0.30 m MV A velocity: 90.10 cm/s   Systemic Diam: 1.80 cm MV E/A ratio:  1.48 Harrell Gave End MD Electronically signed by Nelva Bush MD Signature Date/Time: 01/05/2023/2:36:56 PM    Final    DG Chest Portable 1 View  Result Date: 01/04/2023 CLINICAL DATA:  Central line placement EXAM: PORTABLE CHEST 1 VIEW COMPARISON:  11/08/2022 FINDINGS: Single frontal view of the chest demonstrates an enlarged cardiac silhouette. Defibrillator pad overlies the cardiac silhouette. Right internal jugular catheter tip overlies superior vena cava. No airspace disease, effusion, or pneumothorax. No acute bony abnormality. IMPRESSION: 1. No complication after right internal jugular catheter placement. 2. Stable enlarged cardiac silhouette. Electronically Signed   By: Randa Ngo M.D.   On: 01/04/2023 18:05   CT CORONARY MORPH W/CTA COR W/SCORE W/CA W/CM &/OR WO/CM  Addendum Date: 01/04/2023   ADDENDUM REPORT: 01/04/2023 15:44 ADDENDUM: The following report is an over-read performed by radiologist Dr. Dahlia Bailiff of [facility], [place] on January 04, 2022. This over-read does not include interpretation of cardiac or coronary anatomy or pathology. The coronary CTA interpretation by the cardiologist is attached. COMPARISON:  None. FINDINGS: Vascular: No acute non-cardiac vascular finding. Mediastinum/Nodes: No  pathologically enlarged mediastinal, or hilar lymph nodes, in the visualized portions of the chest. Distal esophagus is grossly unremarkable. Lungs/Pleura: Mosaic attenuation of the lungs. No pleural effusion. No pneumothorax. Upper Abdomen: No acute abnormality. Musculoskeletal: Multilevel degenerative changes spine. IMPRESSION: Mosaic attenuation of the lungs, which can be seen in the setting of small airways disease or study related artifact. Electronically Signed   By: Dahlia Bailiff M.D.   On: 01/04/2023 15:44   Result Date: 01/04/2023 CLINICAL DATA:  Chest pain EXAM: Cardiac/Coronary  CTA TECHNIQUE: The patient was scanned on a [employer] scanner. : A prospective scan was triggered in the descending thoracic aorta. Axial non-contrast 3 mm slices were carried out through the heart. The data set was analyzed on a dedicated work station and scored using the Plum. Gantry rotation speed was 66 msecs and collimation was .6 mm. '100mg'$  of metoprolol and 0.8 mg of sl NTG was given. The 3D data set was reconstructed in 5% intervals of the 60-95 % of the R-R cycle. Diastolic phases were analyzed on a dedicated work station using MPR, MIP and VRT modes. The patient received 125 cc of contrast. FINDINGS: Aorta:  Normal size.  No calcifications.  No dissection. Aortic Valve:  Trileaflet.  No calcifications. Coronary Arteries:  Normal coronary origin.  Right dominance. RCA is a dominant artery that gives rise to PDA and PLA. There is no plaque. Left main gives rise to LAD and LCX arteries.  LM has no disease. LAD has no plaque. LCX is a non-dominant artery.  There is no plaque. Other findings: Normal pulmonary vein drainage into the left atrium. Normal left atrial appendage without a thrombus. Normal size of the pulmonary artery. IMPRESSION: 1. Normal coronary calcium score of 0.  Patient is low risk. 2. Normal coronary origin with right dominance. 3. No evidence of CAD. 4. CAD-RADS 0. Consider  non-atherosclerotic causes of chest pain. 5. Image quality degraded by obesity related artifacts. Electronically Signed: By: Kate Sable M.D. On: 01/04/2023 15:33    Cardiac Studies  TTE 01/05/2023 1. Left ventricular ejection fraction, by estimation, is 60 to 65%. The  left ventricle has normal function. Left ventricular endocardial border  not optimally defined to evaluate regional wall motion. The left  ventricular internal cavity size was mildly  dilated. There is mild left ventricular hypertrophy. Left ventricular  diastolic parameters were normal.   2. Right ventricular systolic function is normal. The right ventricular  size is mildly enlarged. There is moderately elevated pulmonary artery  systolic pressure.   3. Right atrial size was mildly dilated.   4. The mitral valve is grossly normal. Trivial mitral valve  regurgitation.   5. The aortic valve was not well visualized. Aortic valve regurgitation  is not visualized. No aortic stenosis is present.   6. The inferior vena cava is dilated in size with <50% respiratory  variability, suggesting right atrial pressure of 15 mmHg.  Coronary CTA 01/04/23 1. Normal coronary calcium score of 0.  Patient is low risk.   2. Normal coronary origin with right dominance.   3. No evidence of CAD.   4. CAD-RADS 0. Consider non-atherosclerotic causes of chest pain.   5. Image quality degraded by obesity related artifacts.   TTE 08/30/20 1. Left ventricular ejection fraction, by estimation, is 60 to 65%. The  left ventricle has normal function. The left ventricle has no regional  wall motion abnormalities. Left ventricular diastolic parameters were  normal.   2. Right ventricular systolic function is normal. The right ventricular  size is normal. Tricuspid regurgitation signal is inadequate for assessing  PA pressure.   3. Left atrial size was mildly dilated.   4. Right atrial size was mildly dilated.   5. Mild mitral valve  regurgitation.   Patient Profile     61 y.o. female with a history of HFpEF, PAF, hypertension, nonobstructive coronary artery disease, type II diabetes, morbid obesity, OSA, who is being seen and evaluated for symptomatic bradycardia and hypotension following cardiac CT.  Assessment & Plan    Symptomatic bradycardia and shock -felt to be iatrogenic in the setting of beta blocker, calcium channel blocker, flecainide, and nitroglycerin administration for coronary CTA -heart rate have improved, maintaining sinus rates of 70-80's -weaned off of glucagon and levophed -IVFs were discontinued -PTA antihypertensives remain on hold for soft blood pressures that are gradually improving -would increase activity, OOB to chair today to assess for PAF or hypotension  Chronic HFpEF -patient denies SOB as rest - -551 in last 24 hours - down 3 lbs since yesterday -recommend keeping negative fluid balance -questionable I&O, not sure amounts documented  -daily weight, I&O, low sodium diet  Paroxysmal atrial fibrillation -currently maintaining sinus -continued on apixaban 5 mg twice daily -verapamil on hold due to bradycardia and shock -EP has evaluated this morning and patient will currently remain off of flecainide as she is no longer deemed a good candidate, she will follow-up with AF clinic to determine further treatment options        For questions  or updates, please contact [place] Please consult [email or web address] for contact info under        Signed, Mehar Kirkwood, NP  01/06/2023, 11:35 AM

## 2023-01-06 NOTE — Progress Notes (Signed)
Dr. Dwyane Dee gave order to transfer patient to progressive care.

## 2023-01-06 NOTE — Progress Notes (Signed)
   01/06/23 1200  Spiritual Encounters  Type of Visit Initial  Care provided to: Patient  Referral source Nurse (RN/NT/LPN)  Reason for visit Grief/loss  OnCall Visit Yes  Spiritual Framework  Presenting Themes Caregiving needs  Community/Connection Spiritual leader  Patient Stress Factors Health changes   Chaplain responded to [facility] consult to provide spiritual and emotional consultation.

## 2023-01-06 NOTE — TOC Initial Note (Signed)
Transition of Care [facility]) - Initial/Assessment Note    Patient Details  Name: [name] MRN: [id number] Date of Birth: Apr 09, 1961  Transition of Care [facility]) CM/SW Contact:    Shelbie Hutching, RN Phone Number: 01/06/2023, 11:04 AM  Clinical Narrative:                 Patient admitted to the hospital for hypotension after a coronary CTA, patient was instructed to take flecainide and metoprolol prior to scan and then was given IV metoprolol during scan and became hypotensive.  RNCM met with patient at the bedside, introduced self and explained role in DC planning.  Patient verbalizes being frustrated and a little angry with the situation of being admitted when she came in for a simple outpatient scan where she should have been able to return home.  Patient is also anxious about the construction noise.  She praises the staff and compliments on how nice everyone has been.  She requests to speak with a chaplin.  RNCM placed a spiritual consult.  Patient is from home with her husband and 2 cats.  She is independent, current with PCP Dr. Darron Doom, and Cardiologist Dr. Saunders Revel.  [facility] [place].  No TOC needs identified at this time.   Expected Discharge Plan: Home/Self Care Barriers to Discharge: Continued Medical Work up   Patient Goals and CMS Choice Patient states their goals for this hospitalization and ongoing recovery are:: wants to go home          Expected Discharge Plan and Services       Living arrangements for the past 2 months: Single Family Home                 DME Arranged: N/A DME Agency: NA       HH Arranged: NA [place] Agency: NA        Prior Living Arrangements/Services Living arrangements for the past 2 months: Single Family Home Lives with:: Spouse, Pets Patient language and need for interpreter reviewed:: Yes Do you feel safe going back to the place where you live?: Yes      Need for Family Participation in Patient Care: Yes (Comment) Care giver  support system in place?: Yes (comment) Current home services: DME (walking sticks) Criminal Activity/Legal Involvement Pertinent to Current Situation/Hospitalization: No - Comment as needed  Activities of Daily Living Home Assistive Devices/Equipment: CPAP, Eyeglasses, Cane (specify quad or straight) (straight cane) ADL Screening (condition at time of admission) Patient's cognitive ability adequate to safely complete daily activities?: Yes Is the patient deaf or have difficulty hearing?: No Does the patient have difficulty seeing, even when wearing glasses/contacts?: No Does the patient have difficulty concentrating, remembering, or making decisions?: No Patient able to express need for assistance with ADLs?: Yes Does the patient have difficulty dressing or bathing?: No Independently performs ADLs?: Yes (appropriate for developmental age) Does the patient have difficulty walking or climbing stairs?: Yes Weakness of Legs: Both Weakness of Arms/Hands: Both  Permission Sought/Granted                  Emotional Assessment Appearance:: Appears stated age Attitude/Demeanor/Rapport: Engaged Affect (typically observed): Accepting, Frustrated, Anxious Orientation: : Oriented to Self, Oriented to Place, Oriented to  Time, Oriented to Situation Alcohol / Substance Use: Not Applicable Psych Involvement: No (comment)  Admission diagnosis:  Hypotension [I95.9] Overdose of beta-adrenergic antagonist drug, accidental or unintentional, initial encounter [T44.7X1A] Patient Active Problem List   Diagnosis Date Noted  Hypotension 01/04/2023   Overdose of beta-adrenergic antagonist drug 01/04/2023   Gastroenteritis 12/06/2022   DNR (do not resuscitate) 12/06/2022   Gross hematuria 08/10/2020   Sleep apnea 06/03/2020   Paroxysmal atrial fibrillation ([place]) 04/21/2019   Chronic heart failure with preserved ejection fraction (HFpEF) ([place]) 04/21/2019   Type 2 diabetes mellitus without  complication, without long-term current use of insulin ([place]) 04/21/2019   Cough 09/06/2018   Dyspnea and respiratory abnormality 09/06/2018   Mild persistent asthma without complication 76/73/4193   Morbid obesity with BMI of 60.0-69.9, adult ([place]) 09/06/2018   Need for influenza vaccination 09/06/2018   Obstructive sleep apnea, adult 09/06/2018   Abnormal stress test 01/25/2018   Current moderate episode of major depressive disorder without prior episode ([place]) 12/31/2017   Low serum vitamin D 12/31/2017   Depression 09/20/2014   Long term current use of anticoagulant therapy 08/29/2014   Long-term use of high-risk medication 08/29/2014   Anxiety 08/23/2014   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/2014   Urinary bladder incontinence 06/13/2013   Edema 12/16/2012   Other abnormal glucose 12/16/2012   Essential hypertension 10/05/2011   Dependent edema 10/05/2011   History of breast cancer 10/05/2011   Morbid obesity ([place]) 10/05/2011   GERD (gastroesophageal reflux disease) 12/22/1999   PCP:  Hayden Rasmussen, MD [facility]   [id number] [id number]: [phone]: [phone]    Social Determinants of Health (SDOH) Social History: SDOH Screenings   Food Insecurity: No Food Insecurity (01/04/2023)  Housing: Low Risk  (01/04/2023)  Transportation Needs: No Transportation Needs (01/04/2023)  Utilities: Not At Risk (01/04/2023)  Depression (PHQ2-9): Low Risk  (12/23/2022)  Tobacco Use: Medium Risk (01/04/2023)   SDOH Interventions:     Readmission Risk Interventions     No data to display

## 2023-01-06 NOTE — Progress Notes (Signed)
Spoke with Dr. Dwyane Dee and made MD aware that patient is anxious and tearful and asking about her clonazepam that she takes daily. MD gave order for PO clonazepam 0.5 mg BID.

## 2023-01-07 ENCOUNTER — Telehealth: Payer: Self-pay | Admitting: *Deleted

## 2023-01-07 DIAGNOSIS — T447X1A Poisoning by beta-adrenoreceptor antagonists, accidental (unintentional), initial encounter: Secondary | ICD-10-CM | POA: Diagnosis not present

## 2023-01-07 DIAGNOSIS — I480 Paroxysmal atrial fibrillation: Secondary | ICD-10-CM | POA: Diagnosis not present

## 2023-01-07 DIAGNOSIS — I952 Hypotension due to drugs: Secondary | ICD-10-CM | POA: Diagnosis not present

## 2023-01-07 DIAGNOSIS — G4733 Obstructive sleep apnea (adult) (pediatric): Secondary | ICD-10-CM

## 2023-01-07 LAB — BASIC METABOLIC PANEL
Anion gap: 11 (ref 5–15)
BUN: 11 mg/dL (ref 8–23)
CO2: 26 mmol/L (ref 22–32)
Calcium: 8.7 mg/dL — ABNORMAL LOW (ref 8.9–10.3)
Chloride: 100 mmol/L (ref 98–111)
Creatinine, Ser: 0.62 mg/dL (ref 0.44–1.00)
GFR, Estimated: >60 mL/min (ref >60)
Glucose, Bld: 123 mg/dL — ABNORMAL HIGH (ref 70–99)
Potassium: 3.7 mmol/L (ref 3.5–5.1)
Sodium: 137 mmol/L (ref 135–145)

## 2023-01-07 LAB — CBC
HCT: 32.4 % — ABNORMAL LOW (ref 36.0–46.0)
Hemoglobin: 10.5 g/dL — ABNORMAL LOW (ref 12.0–15.0)
MCH: 28.9 pg (ref 26.0–34.0)
MCHC: 32.4 g/dL (ref 30.0–36.0)
MCV: 89.3 fL (ref 80.0–100.0)
Platelets: 283 10*3/uL (ref 150–400)
RBC: 3.63 MIL/uL — ABNORMAL LOW (ref 3.87–5.11)
RDW: 15.7 % — ABNORMAL HIGH (ref 11.5–15.5)
WBC: 8.1 10*3/uL (ref 4.0–10.5)
nRBC: 0 % (ref 0.0–0.2)

## 2023-01-07 LAB — GLUCOSE, CAPILLARY
Glucose-Capillary: 115 mg/dL — ABNORMAL HIGH (ref 70–99)
Glucose-Capillary: 116 mg/dL — ABNORMAL HIGH (ref 70–99)
Glucose-Capillary: 117 mg/dL — ABNORMAL HIGH (ref 70–99)
Glucose-Capillary: 120 mg/dL — ABNORMAL HIGH (ref 70–99)
Glucose-Capillary: 127 mg/dL — ABNORMAL HIGH (ref 70–99)
Glucose-Capillary: 138 mg/dL — ABNORMAL HIGH (ref 70–99)
Glucose-Capillary: 139 mg/dL — ABNORMAL HIGH (ref 70–99)
Glucose-Capillary: 143 mg/dL — ABNORMAL HIGH (ref 70–99)
Glucose-Capillary: 165 mg/dL — ABNORMAL HIGH (ref 70–99)

## 2023-01-07 LAB — PHOSPHORUS: Phosphorus: 5.2 mg/dL — ABNORMAL HIGH (ref 2.5–4.6)

## 2023-01-07 LAB — MAGNESIUM: Magnesium: 1.8 mg/dL (ref 1.7–2.4)

## 2023-01-07 MED ORDER — FUROSEMIDE 40 MG PO TABS
40.0000 mg | ORAL_TABLET | Freq: Every day | ORAL | Status: DC
  Administered 2023-01-07 – 2023-01-08 (×2): 40 mg via ORAL
  Filled 2023-01-07: qty 1
  Filled 2023-01-07: qty 2

## 2023-01-07 MED ORDER — LISINOPRIL 20 MG PO TABS
40.0000 mg | ORAL_TABLET | Freq: Every day | ORAL | Status: DC
  Filled 2023-01-07: qty 2

## 2023-01-07 MED ORDER — POTASSIUM CHLORIDE CRYS ER 20 MEQ PO TBCR
20.0000 meq | EXTENDED_RELEASE_TABLET | Freq: Every day | ORAL | Status: DC
  Administered 2023-01-07 – 2023-01-08 (×2): 20 meq via ORAL
  Filled 2023-01-07 (×2): qty 1

## 2023-01-07 MED ORDER — AMOXICILLIN-POT CLAVULANATE 875-125 MG PO TABS
1.0000 | ORAL_TABLET | Freq: Two times a day (BID) | ORAL | Status: DC
  Administered 2023-01-07 – 2023-01-08 (×3): 1 via ORAL
  Filled 2023-01-07 (×3): qty 1

## 2023-01-07 MED ORDER — VERAPAMIL HCL ER 120 MG PO TBCR
120.0000 mg | EXTENDED_RELEASE_TABLET | Freq: Every day | ORAL | Status: DC
  Administered 2023-01-07 – 2023-01-08 (×2): 120 mg via ORAL
  Filled 2023-01-07 (×2): qty 1

## 2023-01-07 NOTE — Telephone Encounter (Signed)
Patient called in wanting to know if she should keep her appointment tomorrow. She is currently in ICU at this time. Appointment was scheduled for tomorrow and given that she is still in the hospital advised appointment was cancelled. Reviewed that once she is ready to come home they will give her a call to set up follow up appointment. She was very appreciative for the call back with no further questions at this time.

## 2023-01-07 NOTE — Progress Notes (Signed)
PROGRESS NOTE    [name]  OHY:[id number] DOB: [birth year] DOA: 01/04/2023  PCP: Hayden Rasmussen, MD   Brief Narrative:  This 62 years old female presented in [facility] for scheduled coronary CTA in the morning on 01/04/23.  She reports she has taken her 100 mg of flecainide and 100 mg metoprolol prior to CTA as instructed.  Upon arrival at [facility],  her initial heart rate ranged between 69-80. Cardiology was notified, Patient received an additional 10 mg IV metoprolol.  30 minutes following administration, Patient became bradycardic with heart rate in46 and hypotensive BP 75/32.  She has received IV fluid bolus, she subsequently received 0.8 mg of nitroglycerin and became more hypotensive and developed intermittent loss of consciousness.  Patient was transported to the ED, she remained hypotensive.  Patient was given 5 mg IV bolus of glucagon and 1 mg of epinephrine with brief improvement in her blood pressure and heart rate.  However immediately became hypotensive and bradycardic again therefore started on glucagon gtt.  Patient was admitted in the ICU and started on Levophed. She is now improved with weaning of glucagon gtt. and Levophed.  Patient is medically optimized and transferred to [facility] 01/06/2023.  Assessment & Plan:   Principal Problem:   Hypotension Active Problems:   PAF (paroxysmal atrial fibrillation) (HCC)   OSA on CPAP   Overdose of beta-adrenergic antagonist drug   Symptomatic bradycardia and hypotension possibly beta blocker toxicity: Patient was given glucagon with brief improvement in heart rate and blood pressure. She was started on glucagon infusion.  Continue telemetry monitoring. Continue epinephrine as needed and Levophed gtt. to maintain MAP above 65. Hold outpatient beta-blocker and antihypertensives for now. Echo shows LVEF 60 to 65%, RWMA not visualized. Heart rate has improved.  Blood pressure has normalized.  Levophed weaned off. Continue to hold AV nodal  agents. EP evaluation for further recommendation to what medication to continue moving forward. Verapamil restarted at lower dose 120 mg daily. Outpatient follow-up with EP consideration of their appointment with Tikosyn loading.  Paroxysmal A-fibrillation: Maintaining sinus rhythm. Continue Eliquis. Restarted on low-dose verapamil with 120 mg daily.  Type 2 diabetes: Continue regular insulin sliding scale. Carb modified diet.  Mild persistent asthma: Continue supplemental oxygen. Continue bronchodilators as needed.  Chronic HFp EF: Appears euvolemic today. Monitor intake output charting. Echo shows dilated IVC suggesting elevated CVP.  DVT prophylaxis: Lovenox Code Status: Full code. Family Communication: No family at bed side. Disposition Plan:   Status is: Inpatient Remains inpatient appropriate because: Admitted for symptomatic bradycardia and hypotension following beta-blocker overdose.  Now improved.    Consultants:  Cardiology  Procedures: None  Antimicrobials: None  Subjective: Patient was seen and examined at bedside. Overnight events noted.   Patient reports doing much better. She denies any chest pain. She appears anxious and worried.  Objective: Vitals:   01/07/23 0841 01/07/23 1226 01/07/23 1512 01/07/23 1533  BP: (!) 147/86 130/71 (!) 143/76 (!) 137/58  Pulse: 78 71 95 87  Resp: 17 (!) '22 16 20  '$ Temp: 98.2 F (36.8 C) 99.1 F (37.3 C)  99.3 F (37.4 C)  TempSrc: Oral Oral  Oral  SpO2: 98% 96% 95% 100%  Weight:      Height:        Intake/Output Summary (Last 24 hours) at 01/07/2023 1618 Last data filed at 01/07/2023 1500 Gross per 24 hour  Intake 192.25 ml  Output 2 ml  Net 190.25 ml   Filed Weights   01/05/23  0500 01/06/23 0500 01/07/23 0413  Weight: (!) 169.4 kg (!) 167.9 kg (!) 168 kg    Examination:  General exam: Appears comfortable, not in any acute distress.  Deconditioned. Respiratory system: CTA Bilaterally, respiratory  effort normal, RR 15. Cardiovascular system: S1 & S2 heard, regular rate and rhythm, no murmur. Gastrointestinal system: Abdomen is soft, non tender, non distended, BS+ Central nervous system: Alert and oriented x 3. No focal neurological deficits. Extremities: No edema, no cyanosis, no clubbing. Skin: No rashes, lesions or ulcers Psychiatry: Judgement and insight appear normal. Mood & affect appropriate.     Data Reviewed: I have personally reviewed following labs and imaging studies  CBC: Recent Labs  Lab 01/04/23 1410 01/05/23 0340 01/06/23 0505 01/07/23 0513  WBC 11.1* 20.7* 10.1 8.1  NEUTROABS 7.7  --  7.2  --   HGB 10.0* 10.5* 10.1* 10.5*  HCT 31.0* 31.8* 31.2* 32.4*  MCV 90.9 88.8 89.7 89.3  PLT 284 329 257 485   Basic Metabolic Panel: Recent Labs  Lab 01/04/23 1410 01/05/23 0340 01/05/23 2034 01/06/23 0505 01/07/23 0513  NA 133* 135  --  135 137  K 4.2 3.1* 4.2 4.3 3.7  CL 101 101  --  102 100  CO2 25 27  --  26 26  GLUCOSE 118* 166*  --  125* 123*  BUN 13 15  --  10 11  CREATININE 0.67 0.73  --  0.59 0.62  CALCIUM 8.3* 8.6*  --  9.0 8.7*  MG 1.9 1.9 2.0 1.9 1.8  PHOS 3.5 2.4*  --  4.3 5.2*   GFR: Estimated Creatinine Clearance: 118.2 mL/min (by C-G formula based on SCr of 0.62 mg/dL). Liver Function Tests: No results for input(s): "AST", "ALT", "ALKPHOS", "BILITOT", "PROT", "ALBUMIN" in the last 168 hours. No results for input(s): "LIPASE", "AMYLASE" in the last 168 hours. No results for input(s): "AMMONIA" in the last 168 hours. Coagulation Profile: No results for input(s): "INR", "PROTIME" in the last 168 hours. Cardiac Enzymes: No results for input(s): "CKTOTAL", "CKMB", "CKMBINDEX", "TROPONINI" in the last 168 hours. BNP (last 3 results) No results for input(s): "PROBNP" in the last 8760 hours. HbA1C: No results for input(s): "HGBA1C" in the last 72 hours. CBG: Recent Labs  Lab 01/06/23 2303 01/07/23 0357 01/07/23 0751 01/07/23 1202  01/07/23 1519  GLUCAP 139* 127* 143* 115* 120*   Lipid Profile: No results for input(s): "CHOL", "HDL", "LDLCALC", "TRIG", "CHOLHDL", "LDLDIRECT" in the last 72 hours. Thyroid Function Tests: Recent Labs    01/04/23 1854  TSH 2.710  T3FREE 2.8   Anemia Panel: No results for input(s): "VITAMINB12", "FOLATE", "FERRITIN", "TIBC", "IRON", "RETICCTPCT" in the last 72 hours. Sepsis Labs: Recent Labs  Lab 01/05/23 0340  PROCALCITON 2.11    Recent Results (from the past 240 hour(s))  MRSA Next Gen by PCR, Nasal     Status: None   Collection Time: 01/04/23  6:55 PM   Specimen: Nasal Mucosa; Nasal Swab  Result Value Ref Range Status   MRSA by PCR Next Gen NOT DETECTED NOT DETECTED Final    Comment: (NOTE) The GeneXpert MRSA Assay (FDA approved for NASAL specimens only), is one component of a comprehensive MRSA colonization surveillance program. It is not intended to diagnose MRSA infection nor to guide or monitor treatment for MRSA infections. Test performance is not FDA approved in patients less than 64 years old. Performed at [facility], [address], [place], [place] [address]   Culture, blood (Routine X 2)  w Reflex to ID Panel     Status: None (Preliminary result)   Collection Time: 01/05/23  1:46 PM   Specimen: BLOOD LEFT ARM  Result Value Ref Range Status   Specimen Description BLOOD LEFT ARM LAC  Final   Special Requests   Final    BOTTLES DRAWN AEROBIC AND ANAEROBIC Blood Culture results may not be optimal due to an excessive volume of blood received in culture bottles   Culture   Final    NO GROWTH 2 DAYS Performed at [facility], [address], [place], [place] [address]    Report Status PENDING  Incomplete  Culture, blood (Routine X 2) w Reflex to ID Panel     Status: None (Preliminary result)   Collection Time: 01/05/23  2:12 PM   Specimen: BLOOD  Result Value Ref Range Status   Specimen Description BLOOD BLOOD LEFT ARM  Final    Special Requests   Final    BOTTLES DRAWN AEROBIC AND ANAEROBIC Blood Culture adequate volume   Culture   Final    NO GROWTH 2 DAYS Performed at [facility], [address] [place], [place] [address]    Report Status PENDING  Incomplete  Resp panel by RT-PCR (RSV, Flu A&B, Covid) Anterior Nasal Swab     Status: None   Collection Time: 01/05/23  3:39 PM   Specimen: Anterior Nasal Swab  Result Value Ref Range Status   SARS Coronavirus 2 by RT PCR NEGATIVE NEGATIVE Final    Comment: (NOTE) SARS-CoV-2 target nucleic acids are NOT DETECTED.  The SARS-CoV-2 RNA is generally detectable in upper respiratory specimens during the acute phase of infection. The lowest concentration of SARS-CoV-2 viral copies this assay can detect is 138 copies/mL. A negative result does not preclude SARS-Cov-2 infection and should not be used as the sole basis for treatment or other patient management decisions. A negative result may occur with  improper specimen collection/handling, submission of specimen other than nasopharyngeal swab, presence of viral mutation(s) within the areas targeted by this assay, and inadequate number of viral copies(<138 copies/mL). A negative result must be combined with clinical observations, patient history, and epidemiological information. The expected result is Negative.  Fact Sheet for Patients:  EntrepreneurPulse.com.au  Fact Sheet for Healthcare Providers:  IncredibleEmployment.be  This test is no t yet approved or cleared by the [place] FDA and  has been authorized for detection and/or diagnosis of SARS-CoV-2 by FDA under an Emergency Use Authorization (EUA). This EUA will remain  in effect (meaning this test can be used) for the duration of the COVID-19 declaration under Section 564(b)(1) of the Act, 21 U.S.C.section 360bbb-3(b)(1), unless the authorization is terminated  or revoked sooner.       Influenza A by  PCR NEGATIVE NEGATIVE Final   Influenza B by PCR NEGATIVE NEGATIVE Final    Comment: (NOTE) The Xpert Xpress SARS-CoV-2/FLU/RSV plus assay is intended as an aid in the diagnosis of influenza from Nasopharyngeal swab specimens and should not be used as a sole basis for treatment. Nasal washings and aspirates are unacceptable for Xpert Xpress SARS-CoV-2/FLU/RSV testing.  Fact Sheet for Patients: EntrepreneurPulse.com.au  Fact Sheet for Healthcare Providers: IncredibleEmployment.be  This test is not yet approved or cleared by the [place] FDA and has been authorized for detection and/or diagnosis of SARS-CoV-2 by FDA under an Emergency Use Authorization (EUA). This EUA will remain in effect (meaning this test can be used) for the duration of the COVID-19 declaration  under Section 564(b)(1) of the Act, 21 U.S.C. section 360bbb-3(b)(1), unless the authorization is terminated or revoked.     Resp Syncytial Virus by PCR NEGATIVE NEGATIVE Final    Comment: (NOTE) Fact Sheet for Patients: EntrepreneurPulse.com.au  Fact Sheet for Healthcare Providers: IncredibleEmployment.be  This test is not yet approved or cleared by the [place] FDA and has been authorized for detection and/or diagnosis of SARS-CoV-2 by FDA under an Emergency Use Authorization (EUA). This EUA will remain in effect (meaning this test can be used) for the duration of the COVID-19 declaration under Section 564(b)(1) of the Act, 21 U.S.C. section 360bbb-3(b)(1), unless the authorization is terminated or revoked.  Performed at [facility], [place], [place] [address]   C Difficile Quick Screen w PCR reflex     Status: None   Collection Time: 01/05/23  7:38 PM   Specimen: STOOL  Result Value Ref Range Status   C Diff antigen NEGATIVE NEGATIVE Final   C Diff toxin NEGATIVE NEGATIVE Final   C Diff interpretation  No C. difficile detected.  Final    Comment: Performed at [facility], [address] [place], [place] [address]    Radiology Studies: No results found.  Scheduled Meds:  amoxicillin-clavulanate  1 tablet Oral Q12H   apixaban  5 mg Oral Q12H   Chlorhexidine Gluconate Cloth  6 each Topical Daily   clonazePAM  0.5 mg Oral BID   furosemide  40 mg Oral Daily   insulin aspart  0-15 Units Subcutaneous Q4H   lamoTRIgine  150 mg Oral BID   levothyroxine  50 mcg Oral Q0600   loratadine  10 mg Oral Daily   montelukast  10 mg Oral QHS   pantoprazole  40 mg Oral Daily   potassium chloride  20 mEq Oral Daily   verapamil  120 mg Oral Daily   Continuous Infusions:  sodium chloride Stopped (01/07/23 1023)     LOS: 3 days    Time spent: 35 mins    Hayato Guaman, MD [facility]   If 7PM-7AM, please contact night-coverage

## 2023-01-07 NOTE — Progress Notes (Signed)
CPAP in place, patient is resting. No oxygen applied.

## 2023-01-07 NOTE — Progress Notes (Signed)
Progress Note  Patient Name: [name] Date of Encounter: 01/07/2023  Primary Cardiologist: End Primary Electrophysiologist: Quentin Ore  Subjective   No chest pain, dyspnea, palpitations, dizziness, presyncope, or syncope. BP improved into the 185U systolic. Heart rates in the 70s bpm, in sinus. Evaluated by EP yesterday with recommendation to continue to hold flecainide and follow up with them as an outpatient. She is anxious being off rate/rhythm control for Afib. She reports multiple breakthrough episodes of Afib in late 2023, on flecainide.   Inpatient Medications    Scheduled Meds:  apixaban  5 mg Oral Q12H   Chlorhexidine Gluconate Cloth  6 each Topical Daily   clonazePAM  0.5 mg Oral BID   insulin aspart  0-15 Units Subcutaneous Q4H   lamoTRIgine  150 mg Oral BID   levothyroxine  50 mcg Oral Q0600   lisinopril  40 mg Oral Daily   loratadine  10 mg Oral Daily   montelukast  10 mg Oral QHS   pantoprazole  40 mg Oral Daily   Continuous Infusions:  sodium chloride 250 mL (01/06/23 2108)   piperacillin-tazobactam (ZOSYN)  IV 3.375 g (01/07/23 0523)   PRN Meds: acetaminophen, albuterol, docusate sodium, olopatadine, ondansetron (ZOFRAN) IV, polyethylene glycol   Vital Signs    Vitals:   01/07/23 0000 01/07/23 0400 01/07/23 0413 01/07/23 0841  BP: 127/73 (!) 144/78  (!) 147/86  Pulse: 77 78  78  Resp: (!) 23 (!) 28  17  Temp:    98.2 F (36.8 C)  TempSrc:    Oral  SpO2: 92% 95%  98%  Weight:   (!) 168 kg   Height:        Intake/Output Summary (Last 24 hours) at 01/07/2023 0934 Last data filed at 01/07/2023 0300 Gross per 24 hour  Intake 159.61 ml  Output --  Net 159.61 ml   Filed Weights   01/05/23 0500 01/06/23 0500 01/07/23 0413  Weight: (!) 169.4 kg (!) 167.9 kg (!) 168 kg    Telemetry    SR, 70s bpm - Personally Reviewed  ECG    No new tracings - Personally Reviewed  Physical Exam   GEN: No acute distress.   Neck: No JVD. Cardiac: RRR, no  murmurs, rubs, or gallops.  Respiratory: Clear to auscultation bilaterally.  GI: Soft, nontender, non-distended.   MS: No edema; No deformity. Neuro:  Alert and oriented x 3; Nonfocal.  Psych: Normal affect.  Labs    Chemistry Recent Labs  Lab 01/05/23 0340 01/05/23 2034 01/06/23 0505 01/07/23 0513  NA 135  --  135 137  K 3.1* 4.2 4.3 3.7  CL 101  --  102 100  CO2 27  --  26 26  GLUCOSE 166*  --  125* 123*  BUN 15  --  10 11  CREATININE 0.73  --  0.59 0.62  CALCIUM 8.6*  --  9.0 8.7*  GFRNONAA >60  --  >60 >60  ANIONGAP 7  --  7 11     Hematology Recent Labs  Lab 01/05/23 0340 01/06/23 0505 01/07/23 0513  WBC 20.7* 10.1 8.1  RBC 3.58* 3.48* 3.63*  HGB 10.5* 10.1* 10.5*  HCT 31.8* 31.2* 32.4*  MCV 88.8 89.7 89.3  MCH 29.3 29.0 28.9  MCHC 33.0 32.4 32.4  RDW 15.8* 16.1* 15.7*  PLT 329 257 283    Cardiac EnzymesNo results for input(s): "TROPONINI" in the last 168 hours. No results for input(s): "TROPIPOC" in the last 168 hours.  BNPNo results for input(s): "BNP", "PROBNP" in the last 168 hours.   DDimer  Recent Labs  Lab 01/04/23 1854  DDIMER 0.35     Radiology    No results found.  Cardiac Studies   TTE 01/05/2023 1. Left ventricular ejection fraction, by estimation, is 60 to 65%. The  left ventricle has normal function. Left ventricular endocardial border  not optimally defined to evaluate regional wall motion. The left  ventricular internal cavity size was mildly  dilated. There is mild left ventricular hypertrophy. Left ventricular  diastolic parameters were normal.   2. Right ventricular systolic function is normal. The right ventricular  size is mildly enlarged. There is moderately elevated pulmonary artery  systolic pressure.   3. Right atrial size was mildly dilated.   4. The mitral valve is grossly normal. Trivial mitral valve  regurgitation.   5. The aortic valve was not well visualized. Aortic valve regurgitation  is not visualized.  No aortic stenosis is present.   6. The inferior vena cava is dilated in size with <50% respiratory  variability, suggesting right atrial pressure of 15 mmHg.  Coronary CTA 01/04/23 1. Normal coronary calcium score of 0.  Patient is low risk.   2. Normal coronary origin with right dominance.   3. No evidence of CAD.   4. CAD-RADS 0. Consider non-atherosclerotic causes of chest pain.   5. Image quality degraded by obesity related artifacts.   TTE 08/30/20 1. Left ventricular ejection fraction, by estimation, is 60 to 65%. The  left ventricle has normal function. The left ventricle has no regional  wall motion abnormalities. Left ventricular diastolic parameters were  normal.   2. Right ventricular systolic function is normal. The right ventricular  size is normal. Tricuspid regurgitation signal is inadequate for assessing  PA pressure.   3. Left atrial size was mildly dilated.   4. Right atrial size was mildly dilated.   5. Mild mitral valve regurgitation.   Patient Profile     62 y.o. female with history of HFpEF, PAF, hypertension, nonobstructive coronary artery disease, type II diabetes, morbid obesity, OSA, who is being seen and evaluated for symptomatic bradycardia and hypotension following cardiac CT.   Assessment & Plan    1. Symptomatic bradycardia with cardiogenic shock: -Resolved -Felt to be iatrogenic in the setting of beta blocker, calcium channel blocker, flecainide, and nitroglycerin administration for coronary CTA -Status post Glucagon   -Weaned off Levophed  2. PAF: -Maintaining sinus rhythm -Evaluated by EP with recommendation to hold flecainide and consider Tikosyn down the road -Not felt to be an ablation candidate due to BMI -We will reach out to EP for recommendations regarding PTA verapamil, high risk for recurrent Afib -PTA Eliquis 5 mg bid, she does not meet reduced dosing criteria -CHADS2VASc at least 4 (CHF, HTN, DM, sex category) -Follow up with EP  as outpatient   3. HFpEF: -Appears well compensated -Not currently on a standing loop diuretic  -Outpatient follow up  4. HTN: -Blood pressure improving, mildly elevated -Initially, we planned to add back PTA lisinopril 40 mg, but will await EP recommendations given concern for recurrent Afib -May need additional therapy -Follow up in the office     For questions or updates, please contact [facility] Please consult [email or web address] for contact info under Cardiology/STEMI.    Signed, Christell Faith, PA-C [place] Pager: [phone] 01/07/2023, 9:34 AM

## 2023-01-07 NOTE — Progress Notes (Signed)
Patient transferred to room 259 by bed. Alert with no distress noted. Kerin Ransom, RN at bedside.

## 2023-01-08 ENCOUNTER — Ambulatory Visit: Payer: Self-pay | Admitting: Nurse Practitioner

## 2023-01-08 DIAGNOSIS — T447X1A Poisoning by beta-adrenoreceptor antagonists, accidental (unintentional), initial encounter: Secondary | ICD-10-CM | POA: Diagnosis not present

## 2023-01-08 DIAGNOSIS — I480 Paroxysmal atrial fibrillation: Secondary | ICD-10-CM | POA: Diagnosis not present

## 2023-01-08 DIAGNOSIS — I952 Hypotension due to drugs: Secondary | ICD-10-CM | POA: Diagnosis not present

## 2023-01-08 DIAGNOSIS — G4733 Obstructive sleep apnea (adult) (pediatric): Secondary | ICD-10-CM | POA: Diagnosis not present

## 2023-01-08 LAB — GLUCOSE, CAPILLARY
Glucose-Capillary: 105 mg/dL — ABNORMAL HIGH (ref 70–99)
Glucose-Capillary: 126 mg/dL — ABNORMAL HIGH (ref 70–99)

## 2023-01-08 MED ORDER — VERAPAMIL HCL ER 120 MG PO TBCR: 120.0000 mg | EXTENDED_RELEASE_TABLET | Freq: Two times a day (BID) | ORAL | 1 refills | Status: DC

## 2023-01-08 MED ORDER — AMOXICILLIN-POT CLAVULANATE 875-125 MG PO TABS: 1.0000 | ORAL_TABLET | Freq: Two times a day (BID) | ORAL | 0 refills | Status: AC

## 2023-01-08 MED ORDER — VERAPAMIL HCL ER 120 MG PO TBCR: 120.0000 mg | EXTENDED_RELEASE_TABLET | Freq: Two times a day (BID) | ORAL | Status: DC

## 2023-01-08 MED ORDER — VERAPAMIL HCL 40 MG PO TABS: 40.0000 mg | ORAL_TABLET | Freq: Two times a day (BID) | ORAL | 1 refills | Status: DC | PRN

## 2023-01-08 MED ORDER — VERAPAMIL HCL 40 MG PO TABS: 40.0000 mg | ORAL_TABLET | Freq: Two times a day (BID) | ORAL | Status: DC | PRN

## 2023-01-08 NOTE — Discharge Instructions (Signed)
Advised to follow-up with primary care physician in 1 week. Advised to follow-up with cardiology as scheduled. Advised to take verapamil 120 mg twice daily for atrial fibrillation. Advised to take verapamil 40 mg twice daily as needed as needed for tacky cardia. Hold flecainide, lisinopril and can be resumed as an outpatient.

## 2023-01-08 NOTE — Discharge Summary (Signed)
Physician Discharge Summary  [name]:[id number] DOB: September 16, 1961 DOA: 01/04/2023  PCP: Hayden Rasmussen, MD  Admit date: 01/04/2023  Discharge date: 01/08/2023  Admitted From: Home.  Disposition:  Home.  Recommendations for Outpatient Follow-up:  Follow up with PCP in 1-2 weeks. Please obtain BMP/CBC in one week. Advised to follow-up with cardiology as scheduled. Advised to take verapamil 120 mg twice daily for atrial fibrillation. Advised to take verapamil 40 mg twice daily as needed for tachycardia. Hold flecainide, lisinopril and can be resumed as an outpatient.  Home Health: None Equipment/Devices: None  Discharge Condition: Stable CODE STATUS: Full code Diet recommendation: Heart Healthy   Brief [facility] Course: This 62 years old female presented in [facility] for scheduled coronary CTA in the morning on 01/04/23.  She reports she has taken her 100 mg of flecainide and 100 mg metoprolol prior to CTA as instructed.  Upon arrival at [facility],  her initial heart rate ranged between 69-80. Cardiology was notified, Patient received an additional 10 mg IV metoprolol. 30 minutes following administration, Patient became bradycardic with heart rate in 46 and hypotensive BP 75/32.  She has received IV fluid bolus, She subsequently received 0.8 mg of nitroglycerin and became more hypotensive and developed intermittent loss of consciousness.  Patient was transported to the ED, She remained hypotensive.  Patient was given 5 mg IV bolus of glucagon and 1 mg of epinephrine with brief improvement in her blood pressure and heart rate.  However immediately became hypotensive and bradycardic again therefore started on glucagon gtt.  Patient was admitted in the ICU and started on Levophed. She is now improved with weaning of glucagon gtt. and Levophed.  Patient is medically optimized and transferred to [facility] on telemetry 01/06/2023.  Patient was seen by cardiology during hospitalization.  She was slowly  started on verapamil 120 mg daily which she tolerated well.  Heart rate and blood pressure remained stable.  Patient feels better wants to be discharged.  Cleared from cardiology.  Flecainide lisinopril is kept on hold and can be resumed as an outpatient. Patient is being discharged on verapamil.  Patient will follow-up with cardiology.  She has developed aspiration, she  was initiated on Unasyn which is converted to Augmentin on discharge.  Discharge Diagnoses:  Principal Problem:   Hypotension Active Problems:   PAF (paroxysmal atrial fibrillation) (HCC)   OSA on CPAP   Overdose of beta-adrenergic antagonist drug  Symptomatic bradycardia and hypotension possibly beta blocker toxicity: Patient was given glucagon with brief improvement in heart rate and blood pressure. She was started on glucagon infusion.  Continue telemetry monitoring. Continue epinephrine as needed and Levophed gtt. to maintain MAP above 65. Hold outpatient beta-blocker and antihypertensives for now. Echo shows LVEF 60 to 65%, RWMA not visualized. Heart rate has improved.  Blood pressure has normalized.  Levophed weaned off. Continue to hold AV nodal agents. EP evaluation for further recommendation to what medication to continue moving forward. Verapamil restarted at lower dose 120 mg daily, tolerated well. Heart rate and blood pressure remains stable.  Verapamil increased to 120 mg twice daily. Outpatient follow-up with EP consideration of their appointment with Tikosyn loading.   Paroxysmal A-fibrillation: Maintaining sinus rhythm. Continue Eliquis. Restarted on low-dose verapamil with 120 mg daily. Continue verapamil 120 mg twice daily.   Type 2 diabetes: Continue regular insulin sliding scale. Carb modified diet.   Mild persistent asthma: Continue supplemental oxygen. Continue bronchodilators as needed.   Chronic HFp EF: Appears euvolemic today.  Monitor intake output charting. Echo shows dilated IVC  suggesting elevated CVP.  Discharge Instructions  Discharge Instructions     Call MD for:  difficulty breathing, headache or visual disturbances   Complete by: As directed    Call MD for:  persistant dizziness or light-headedness   Complete by: As directed    Call MD for:  persistant nausea and vomiting   Complete by: As directed    Diet - low sodium heart healthy   Complete by: As directed    Diet Carb Modified   Complete by: As directed    Discharge instructions   Complete by: As directed    Advised to follow-up with primary care physician in 1 week. Advised to follow-up with cardiology as scheduled. Advised to take verapamil 120 mg twice daily for atrial fibrillation. Advised to take verapamil 40 mg twice daily as needed as needed for tacky cardia. Hold flecainide, lisinopril and can be resumed as an outpatient.   Increase activity slowly   Complete by: As directed       Allergies as of 01/08/2023       Reactions   Erythromycin Diarrhea        Medication List     STOP taking these medications    flecainide 100 MG tablet Commonly known as: TAMBOCOR   lisinopril 40 MG tablet Commonly known as: ZESTRIL       TAKE these medications    albuterol 108 (90 Base) MCG/ACT inhaler Commonly known as: VENTOLIN HFA Inhale 2 puffs into the lungs every 6 (six) hours as needed for wheezing or shortness of breath.   amoxicillin-clavulanate 875-125 MG tablet Commonly known as: AUGMENTIN Take 1 tablet by mouth every 12 (twelve) hours for 4 days.   apixaban 5 MG Tabs tablet Commonly known as: ELIQUIS Take 1 tablet (5 mg total) by mouth every 12 (twelve) hours.   Cholecalciferol 125 MCG (5000 UT) Chew Chew 5,000 Units by mouth daily.   clonazePAM 0.5 MG tablet Commonly known as: KLONOPIN Take 0.5 mg by mouth 2 (two) times daily.   fexofenadine 180 MG tablet Commonly known as: ALLEGRA Take 180 mg by mouth daily.   furosemide 40 MG tablet Commonly known as:  LASIX Take 1 tablet (40 mg total) by mouth daily for 1 day.   lamoTRIgine 100 MG tablet Commonly known as: LAMICTAL Take 150 mg by mouth every 12 (twelve) hours.   levothyroxine 50 MCG tablet Commonly known as: SYNTHROID Take 50 mcg by mouth daily before breakfast.   metFORMIN 500 MG tablet Commonly known as: GLUCOPHAGE Take 1,000 mg by mouth 2 (two) times daily.   montelukast 10 MG tablet Commonly known as: SINGULAIR Take 10 mg by mouth at bedtime.   omeprazole 20 MG capsule Commonly known as: PRILOSEC Take 20 mg by mouth every 12 (twelve) hours.   PATADAY OP Place 1 drop into both eyes daily.   promethazine 12.5 MG tablet Commonly known as: PHENERGAN Take 12.5 mg by mouth every 8 (eight) hours as needed for nausea or vomiting.   verapamil 120 MG CR tablet Commonly known as: CALAN-SR Take 1 tablet (120 mg total) by mouth 2 (two) times daily. What changed:  medication strength how much to take when to take this   verapamil 40 MG tablet Commonly known as: CALAN Take 1 tablet (40 mg total) by mouth 2 (two) times daily as needed (palpitations). What changed: You were already taking a medication with the same name, and this prescription was added.  Make sure you understand how and when to take each.        Follow-up Information     Hayden Rasmussen, MD Follow up in 1 week(s).   Specialty: Family Medicine Contact information: [place] [address] [phone]         Nelva Bush, MD. Follow up.   Specialty: Cardiology Why: Appointment on Friday, 02/12/2023 at 1:40pm. Contact information: [place] [address] [phone]                Allergies  Allergen Reactions   Erythromycin Diarrhea    Consultations: Cardiology PCCM   Procedures/Studies: ECHOCARDIOGRAM COMPLETE  Result Date: 01/05/2023    ECHOCARDIOGRAM REPORT   Patient Name:   [name] Date of Exam: 01/05/2023  Medical Rec #:  [id number]    Height:       65.0 in Accession #:    [id number]   Weight:       373.5 lb Date of Birth:  [birth year]    BSA:          2.579 m Patient Age:    62 years     BP:           108/65 mmHg Patient Gender: F            HR:           61 bpm. Exam Location:  [facility] Procedure: 2D Echo Indications:     atrial fibrillation  History:         Patient has prior history of Echocardiogram examinations, most                  recent 08/30/2020. CHF; Risk Factors:Hypertension and Diabetes.  Sonographer:     Harvie Junior Referring Phys:  [id number] Dreama Saa NELSON Diagnosing Phys: Nelva Bush MD  Sonographer Comments: Technically difficult study due to poor echo windows and patient is obese. Image acquisition challenging due to patient body habitus. IMPRESSIONS  1. Left ventricular ejection fraction, by estimation, is 60 to 65%. The left ventricle has normal function. Left ventricular endocardial border not optimally defined to evaluate regional wall motion. The left ventricular internal cavity size was mildly dilated. There is mild left ventricular hypertrophy. Left ventricular diastolic parameters were normal.  2. Right ventricular systolic function is normal. The right ventricular size is mildly enlarged. There is moderately elevated pulmonary artery systolic pressure.  3. Right atrial size was mildly dilated.  4. The mitral valve is grossly normal. Trivial mitral valve regurgitation.  5. The aortic valve was not well visualized. Aortic valve regurgitation is not visualized. No aortic stenosis is present.  6. The inferior vena cava is dilated in size with <50% respiratory variability, suggesting right atrial pressure of 15 mmHg. FINDINGS  Left Ventricle: Left ventricular ejection fraction, by estimation, is 60 to 65%. The left ventricle has normal function. Left ventricular endocardial border not optimally defined to evaluate regional wall motion. The left ventricular internal cavity size was mildly dilated.  There is mild left ventricular hypertrophy. Left ventricular diastolic parameters were normal. Right Ventricle: The right ventricular size is mildly enlarged. No increase in right ventricular wall thickness. Right ventricular systolic function is normal. There is moderately elevated pulmonary artery systolic pressure. The tricuspid regurgitant velocity is 2.95 m/s, and with an assumed right atrial pressure of 15 mmHg, the estimated right ventricular systolic pressure is 67.6 mmHg. Left Atrium: Left atrial size was normal in  size. Right Atrium: Right atrial size was mildly dilated. Pericardium: There is no evidence of pericardial effusion. Mitral Valve: The mitral valve is grossly normal. Trivial mitral valve regurgitation. Tricuspid Valve: The tricuspid valve is not well visualized. Tricuspid valve regurgitation is trivial. Aortic Valve: The aortic valve was not well visualized. Aortic valve regurgitation is not visualized. No aortic stenosis is present. Aortic valve mean gradient measures 6.7 mmHg. Aortic valve peak gradient measures 12.2 mmHg. Aortic valve area, by VTI measures 2.20 cm. Pulmonic Valve: The pulmonic valve was not well visualized. Pulmonic valve regurgitation is trivial. No evidence of pulmonic stenosis. Aorta: The aortic root is normal in size and structure. Pulmonary Artery: The pulmonary artery is not well seen. Venous: The inferior vena cava is dilated in size with less than 50% respiratory variability, suggesting right atrial pressure of 15 mmHg. IAS/Shunts: The interatrial septum was not well visualized.  LEFT VENTRICLE PLAX 2D LVIDd:         5.40 cm      Diastology LVIDs:         3.50 cm      LV e' medial:    9.79 cm/s LV PW:         1.10 cm      LV E/e' medial:  13.6 LV IVS:        1.10 cm      LV e' lateral:   11.90 cm/s LVOT diam:     1.80 cm      LV E/e' lateral: 11.2 LV SV:         77 LV SV Index:   30 LVOT Area:     2.54 cm                              3D Volume EF: LV Volumes (MOD)             3D EF:        61 % LV vol d, MOD A2C: 67.8 ml  LV EDV:       409 ml LV vol d, MOD A4C: 115.0 ml LV ESV:       161 ml LV vol s, MOD A2C: 34.9 ml  LV SV:        248 ml LV vol s, MOD A4C: 39.7 ml LV SV MOD A2C:     32.9 ml LV SV MOD A4C:     115.0 ml LV SV MOD BP:      56.1 ml RIGHT VENTRICLE RV Basal diam:  4.30 cm RV Mid diam:    3.30 cm RV S prime:     16.30 cm/s TAPSE (M-mode): 2.5 cm LEFT ATRIUM           Index        RIGHT ATRIUM           Index LA diam:      3.60 cm 1.40 cm/m   RA Area:     24.80 cm LA Vol (A2C): 44.8 ml 17.37 ml/m  RA Volume:   78.50 ml  30.43 ml/m LA Vol (A4C): 58.6 ml 22.72 ml/m  AORTIC VALVE                     PULMONIC VALVE AV Area (Vmax):    2.06 cm      PV Vmax:       1.19 m/s AV Area (Vmean):   2.03 cm  PV Peak grad:  5.7 mmHg AV Area (VTI):     2.20 cm AV Vmax:           174.33 cm/s AV Vmean:          116.667 cm/s AV VTI:            0.349 m AV Peak Grad:      12.2 mmHg AV Mean Grad:      6.7 mmHg LVOT Vmax:         141.00 cm/s LVOT Vmean:        92.900 cm/s LVOT VTI:          0.302 m LVOT/AV VTI ratio: 0.86  AORTA Ao Root diam: 2.80 cm MITRAL VALVE                TRICUSPID VALVE MV Area (PHT): 4.68 cm     TR Peak grad:   34.8 mmHg MV Decel Time: 162 msec     TR Vmax:        295.00 cm/s MR Peak grad: 36.2 mmHg MR Vmax:      301.00 cm/s   SHUNTS MV E velocity: 133.00 cm/s  Systemic VTI:  0.30 m MV A velocity: 90.10 cm/s   Systemic Diam: 1.80 cm MV E/A ratio:  1.48 Harrell Gave End MD Electronically signed by Nelva Bush MD Signature Date/Time: 01/05/2023/2:36:56 PM    Final    DG Chest Portable 1 View  Result Date: 01/04/2023 CLINICAL DATA:  Central line placement EXAM: PORTABLE CHEST 1 VIEW COMPARISON:  11/08/2022 FINDINGS: Single frontal view of the chest demonstrates an enlarged cardiac silhouette. Defibrillator pad overlies the cardiac silhouette. Right internal jugular catheter tip overlies superior vena cava. No airspace disease, effusion, or  pneumothorax. No acute bony abnormality. IMPRESSION: 1. No complication after right internal jugular catheter placement. 2. Stable enlarged cardiac silhouette. Electronically Signed   By: Randa Ngo M.D.   On: 01/04/2023 18:05   CT CORONARY MORPH W/CTA COR W/SCORE W/CA W/CM &/OR WO/CM  Addendum Date: 01/04/2023   ADDENDUM REPORT: 01/04/2023 15:44 ADDENDUM: The following report is an over-read performed by radiologist Dr. Dahlia Bailiff of [facility], [place] on January 04, 2022. This over-read does not include interpretation of cardiac or coronary anatomy or pathology. The coronary CTA interpretation by the cardiologist is attached. COMPARISON:  None. FINDINGS: Vascular: No acute non-cardiac vascular finding. Mediastinum/Nodes: No pathologically enlarged mediastinal, or hilar lymph nodes, in the visualized portions of the chest. Distal esophagus is grossly unremarkable. Lungs/Pleura: Mosaic attenuation of the lungs. No pleural effusion. No pneumothorax. Upper Abdomen: No acute abnormality. Musculoskeletal: Multilevel degenerative changes spine. IMPRESSION: Mosaic attenuation of the lungs, which can be seen in the setting of small airways disease or study related artifact. Electronically Signed   By: Dahlia Bailiff M.D.   On: 01/04/2023 15:44   Result Date: 01/04/2023 CLINICAL DATA:  Chest pain EXAM: Cardiac/Coronary  CTA TECHNIQUE: The patient was scanned on a [employer] scanner. : A prospective scan was triggered in the descending thoracic aorta. Axial non-contrast 3 mm slices were carried out through the heart. The data set was analyzed on a dedicated work station and scored using the [place]. Gantry rotation speed was 66 msecs and collimation was .6 mm. '100mg'$  of metoprolol and 0.8 mg of sl NTG was given. The 3D data set was reconstructed in 5% intervals of the 60-95 % of the R-R cycle. Diastolic phases were analyzed on a dedicated work station using MPR, MIP and  VRT modes. The  patient received 125 cc of contrast. FINDINGS: Aorta:  Normal size.  No calcifications.  No dissection. Aortic Valve:  Trileaflet.  No calcifications. Coronary Arteries:  Normal coronary origin.  Right dominance. RCA is a dominant artery that gives rise to PDA and PLA. There is no plaque. Left main gives rise to LAD and LCX arteries.  LM has no disease. LAD has no plaque. LCX is a non-dominant artery.  There is no plaque. Other findings: Normal pulmonary vein drainage into the left atrium. Normal left atrial appendage without a thrombus. Normal size of the pulmonary artery. IMPRESSION: 1. Normal coronary calcium score of 0.  Patient is low risk. 2. Normal coronary origin with right dominance. 3. No evidence of CAD. 4. CAD-RADS 0. Consider non-atherosclerotic causes of chest pain. 5. Image quality degraded by obesity related artifacts. Electronically Signed: By: Kate Sable M.D. On: 01/04/2023 15:33     Subjective: Patient was seen and examined at bedside.  Overnight events noted.   Patient reports doing much better and wants to be discharged.   Heart rate and blood pressure remains controlled.  Discharge Exam: Vitals:   01/08/23 0410 01/08/23 0801  BP: 136/81 137/76  Pulse: 75 74  Resp: 18 18  Temp: 97.9 F (36.6 C) 98.4 F (36.9 C)  SpO2: 99% 97%   Vitals:   01/07/23 2026 01/07/23 2358 01/08/23 0410 01/08/23 0801  BP: 130/64 137/71 136/81 137/76  Pulse: 81 81 75 74  Resp: '20 20 18 18  '$ Temp: 98.8 F (37.1 C) 98.1 F (36.7 C) 97.9 F (36.6 C) 98.4 F (36.9 C)  TempSrc:    Oral  SpO2: 97% 97% 99% 97%  Weight:      Height:        General: Pt is alert, awake, not in acute distress Cardiovascular: RRR, S1/S2 +, no rubs, no gallops Respiratory: CTA bilaterally, no wheezing, no rhonchi Abdominal: Soft, NT, ND, bowel sounds + Extremities: no edema, no cyanosis    The results of significant diagnostics from this hospitalization (including imaging, microbiology, ancillary and  laboratory) are listed below for reference.     Microbiology: Recent Results (from the past 240 hour(s))  MRSA Next Gen by PCR, Nasal     Status: None   Collection Time: 01/04/23  6:55 PM   Specimen: Nasal Mucosa; Nasal Swab  Result Value Ref Range Status   MRSA by PCR Next Gen NOT DETECTED NOT DETECTED Final    Comment: (NOTE) The GeneXpert MRSA Assay (FDA approved for NASAL specimens only), is one component of a comprehensive MRSA colonization surveillance program. It is not intended to diagnose MRSA infection nor to guide or monitor treatment for MRSA infections. Test performance is not FDA approved in patients less than 47 years old. Performed at [facility], [address] [place], [place] [address]   Culture, blood (Routine X 2) w Reflex to ID Panel     Status: None (Preliminary result)   Collection Time: 01/05/23  1:46 PM   Specimen: BLOOD LEFT ARM  Result Value Ref Range Status   Specimen Description BLOOD LEFT ARM LAC  Final   Special Requests   Final    BOTTLES DRAWN AEROBIC AND ANAEROBIC Blood Culture results may not be optimal due to an excessive volume of blood received in culture bottles   Culture   Final    NO GROWTH 3 DAYS Performed at [facility], [address], [place], [place] [address]  Report Status PENDING  Incomplete  Culture, blood (Routine X 2) w Reflex to ID Panel     Status: None (Preliminary result)   Collection Time: 01/05/23  2:12 PM   Specimen: BLOOD  Result Value Ref Range Status   Specimen Description BLOOD BLOOD LEFT ARM  Final   Special Requests   Final    BOTTLES DRAWN AEROBIC AND ANAEROBIC Blood Culture adequate volume   Culture   Final    NO GROWTH 3 DAYS Performed at [facility], [address], [place], [place] [address]    Report Status PENDING  Incomplete  Resp panel by RT-PCR (RSV, Flu A&B, Covid) Anterior Nasal Swab     Status: None   Collection Time: 01/05/23  3:39 PM   Specimen: Anterior  Nasal Swab  Result Value Ref Range Status   SARS Coronavirus 2 by RT PCR NEGATIVE NEGATIVE Final    Comment: (NOTE) SARS-CoV-2 target nucleic acids are NOT DETECTED.  The SARS-CoV-2 RNA is generally detectable in upper respiratory specimens during the acute phase of infection. The lowest concentration of SARS-CoV-2 viral copies this assay can detect is 138 copies/mL. A negative result does not preclude SARS-Cov-2 infection and should not be used as the sole basis for treatment or other patient management decisions. A negative result may occur with  improper specimen collection/handling, submission of specimen other than nasopharyngeal swab, presence of viral mutation(s) within the areas targeted by this assay, and inadequate number of viral copies(<138 copies/mL). A negative result must be combined with clinical observations, patient history, and epidemiological information. The expected result is Negative.  Fact Sheet for Patients:  EntrepreneurPulse.com.au  Fact Sheet for Healthcare Providers:  IncredibleEmployment.be  This test is no t yet approved or cleared by the [place] FDA and  has been authorized for detection and/or diagnosis of SARS-CoV-2 by FDA under an Emergency Use Authorization (EUA). This EUA will remain  in effect (meaning this test can be used) for the duration of the COVID-19 declaration under Section 564(b)(1) of the Act, 21 U.S.C.section 360bbb-3(b)(1), unless the authorization is terminated  or revoked sooner.       Influenza A by PCR NEGATIVE NEGATIVE Final   Influenza B by PCR NEGATIVE NEGATIVE Final    Comment: (NOTE) The Xpert Xpress SARS-CoV-2/FLU/RSV plus assay is intended as an aid in the diagnosis of influenza from Nasopharyngeal swab specimens and should not be used as a sole basis for treatment. Nasal washings and aspirates are unacceptable for Xpert Xpress SARS-CoV-2/FLU/RSV testing.  Fact Sheet for  Patients: EntrepreneurPulse.com.au  Fact Sheet for Healthcare Providers: IncredibleEmployment.be  This test is not yet approved or cleared by the [place] FDA and has been authorized for detection and/or diagnosis of SARS-CoV-2 by FDA under an Emergency Use Authorization (EUA). This EUA will remain in effect (meaning this test can be used) for the duration of the COVID-19 declaration under Section 564(b)(1) of the Act, 21 U.S.C. section 360bbb-3(b)(1), unless the authorization is terminated or revoked.     Resp Syncytial Virus by PCR NEGATIVE NEGATIVE Final    Comment: (NOTE) Fact Sheet for Patients: EntrepreneurPulse.com.au  Fact Sheet for Healthcare Providers: IncredibleEmployment.be  This test is not yet approved or cleared by the [place] FDA and has been authorized for detection and/or diagnosis of SARS-CoV-2 by FDA under an Emergency Use Authorization (EUA). This EUA will remain in effect (meaning this test can be used) for the duration of the COVID-19 declaration under Section 564(b)(1) of the Act, 21  U.S.C. section 360bbb-3(b)(1), unless the authorization is terminated or revoked.  Performed at [facility], [place], [place] [address]   C Difficile Quick Screen w PCR reflex     Status: None   Collection Time: 01/05/23  7:38 PM   Specimen: STOOL  Result Value Ref Range Status   C Diff antigen NEGATIVE NEGATIVE Final   C Diff toxin NEGATIVE NEGATIVE Final   C Diff interpretation No C. difficile detected.  Final    Comment: Performed at [facility], [address] [place], [place] [address]     Labs: BNP (last 3 results) Recent Labs    10/11/22 1609 11/08/22 1350 11/29/22 1827  BNP 33.8 38.3 09.8   Basic Metabolic Panel: Recent Labs  Lab 01/04/23 1410 01/05/23 0340 01/05/23 2034 01/06/23 0505 01/07/23 0513  NA 133* 135  --  135 137  K  4.2 3.1* 4.2 4.3 3.7  CL 101 101  --  102 100  CO2 25 27  --  26 26  GLUCOSE 118* 166*  --  125* 123*  BUN 13 15  --  10 11  CREATININE 0.67 0.73  --  0.59 0.62  CALCIUM 8.3* 8.6*  --  9.0 8.7*  MG 1.9 1.9 2.0 1.9 1.8  PHOS 3.5 2.4*  --  4.3 5.2*   Liver Function Tests: No results for input(s): "AST", "ALT", "ALKPHOS", "BILITOT", "PROT", "ALBUMIN" in the last 168 hours. No results for input(s): "LIPASE", "AMYLASE" in the last 168 hours. No results for input(s): "AMMONIA" in the last 168 hours. CBC: Recent Labs  Lab 01/04/23 1410 01/05/23 0340 01/06/23 0505 01/07/23 0513  WBC 11.1* 20.7* 10.1 8.1  NEUTROABS 7.7  --  7.2  --   HGB 10.0* 10.5* 10.1* 10.5*  HCT 31.0* 31.8* 31.2* 32.4*  MCV 90.9 88.8 89.7 89.3  PLT 284 329 257 283   Cardiac Enzymes: No results for input(s): "CKTOTAL", "CKMB", "CKMBINDEX", "TROPONINI" in the last 168 hours. BNP: Invalid input(s): "POCBNP" CBG: Recent Labs  Lab 01/07/23 1519 01/07/23 1941 01/07/23 2342 01/08/23 0400 01/08/23 0758  GLUCAP 120* 138* 116* 105* 126*   D-Dimer No results for input(s): "DDIMER" in the last 72 hours. Hgb A1c No results for input(s): "HGBA1C" in the last 72 hours. Lipid Profile No results for input(s): "CHOL", "HDL", "LDLCALC", "TRIG", "CHOLHDL", "LDLDIRECT" in the last 72 hours. Thyroid function studies No results for input(s): "TSH", "T4TOTAL", "T3FREE", "THYROIDAB" in the last 72 hours.  Invalid input(s): "FREET3" Anemia work up No results for input(s): "VITAMINB12", "FOLATE", "FERRITIN", "TIBC", "IRON", "RETICCTPCT" in the last 72 hours. Urinalysis    Component Value Date/Time   COLORURINE YELLOW (A) 01/05/2023 1938   APPEARANCEUR CLEAR (A) 01/05/2023 1938   APPEARANCEUR Cloudy (A) 10/01/2020 1520   LABSPEC 1.012 01/05/2023 1938   PHURINE 6.0 01/05/2023 1938   GLUCOSEU NEGATIVE 01/05/2023 1938   HGBUR NEGATIVE 01/05/2023 1938   BILIRUBINUR NEGATIVE 01/05/2023 1938   BILIRUBINUR Negative  10/01/2020 1520   KETONESUR 5 (A) 01/05/2023 1938   PROTEINUR NEGATIVE 01/05/2023 1938   NITRITE NEGATIVE 01/05/2023 1938   LEUKOCYTESUR NEGATIVE 01/05/2023 1938   Sepsis Labs Recent Labs  Lab 01/04/23 1410 01/05/23 0340 01/06/23 0505 01/07/23 0513  WBC 11.1* 20.7* 10.1 8.1   Microbiology Recent Results (from the past 240 hour(s))  MRSA Next Gen by PCR, Nasal     Status: None   Collection Time: 01/04/23  6:55 PM   Specimen: Nasal Mucosa; Nasal Swab  Result Value  Ref Range Status   MRSA by PCR Next Gen NOT DETECTED NOT DETECTED Final    Comment: (NOTE) The GeneXpert MRSA Assay (FDA approved for NASAL specimens only), is one component of a comprehensive MRSA colonization surveillance program. It is not intended to diagnose MRSA infection nor to guide or monitor treatment for MRSA infections. Test performance is not FDA approved in patients less than 71 years old. Performed at [facility], [address] [place], [place] [address]   Culture, blood (Routine X 2) w Reflex to ID Panel     Status: None (Preliminary result)   Collection Time: 01/05/23  1:46 PM   Specimen: BLOOD LEFT ARM  Result Value Ref Range Status   Specimen Description BLOOD LEFT ARM LAC  Final   Special Requests   Final    BOTTLES DRAWN AEROBIC AND ANAEROBIC Blood Culture results may not be optimal due to an excessive volume of blood received in culture bottles   Culture   Final    NO GROWTH 3 DAYS Performed at [facility], [address] [place], [place] [address]    Report Status PENDING  Incomplete  Culture, blood (Routine X 2) w Reflex to ID Panel     Status: None (Preliminary result)   Collection Time: 01/05/23  2:12 PM   Specimen: BLOOD  Result Value Ref Range Status   Specimen Description BLOOD BLOOD LEFT ARM  Final   Special Requests   Final    BOTTLES DRAWN AEROBIC AND ANAEROBIC Blood Culture adequate volume   Culture   Final    NO GROWTH 3 DAYS Performed at [facility], [address], [place], [place] [address]    Report Status PENDING  Incomplete  Resp panel by RT-PCR (RSV, Flu A&B, Covid) Anterior Nasal Swab     Status: None   Collection Time: 01/05/23  3:39 PM   Specimen: Anterior Nasal Swab  Result Value Ref Range Status   SARS Coronavirus 2 by RT PCR NEGATIVE NEGATIVE Final    Comment: (NOTE) SARS-CoV-2 target nucleic acids are NOT DETECTED.  The SARS-CoV-2 RNA is generally detectable in upper respiratory specimens during the acute phase of infection. The lowest concentration of SARS-CoV-2 viral copies this assay can detect is 138 copies/mL. A negative result does not preclude SARS-Cov-2 infection and should not be used as the sole basis for treatment or other patient management decisions. A negative result may occur with  improper specimen collection/handling, submission of specimen other than nasopharyngeal swab, presence of viral mutation(s) within the areas targeted by this assay, and inadequate number of viral copies(<138 copies/mL). A negative result must be combined with clinical observations, patient history, and epidemiological information. The expected result is Negative.  Fact Sheet for Patients:  EntrepreneurPulse.com.au  Fact Sheet for Healthcare Providers:  IncredibleEmployment.be  This test is no t yet approved or cleared by the [place] FDA and  has been authorized for detection and/or diagnosis of SARS-CoV-2 by FDA under an Emergency Use Authorization (EUA). This EUA will remain  in effect (meaning this test can be used) for the duration of the COVID-19 declaration under Section 564(b)(1) of the Act, 21 U.S.C.section 360bbb-3(b)(1), unless the authorization is terminated  or revoked sooner.       Influenza A by PCR NEGATIVE NEGATIVE Final   Influenza B by PCR NEGATIVE NEGATIVE Final    Comment: (NOTE) The Xpert Xpress SARS-CoV-2/FLU/RSV plus assay is intended as  an aid in the diagnosis of influenza from  Nasopharyngeal swab specimens and should not be used as a sole basis for treatment. Nasal washings and aspirates are unacceptable for Xpert Xpress SARS-CoV-2/FLU/RSV testing.  Fact Sheet for Patients: EntrepreneurPulse.com.au  Fact Sheet for Healthcare Providers: IncredibleEmployment.be  This test is not yet approved or cleared by the [place] FDA and has been authorized for detection and/or diagnosis of SARS-CoV-2 by FDA under an Emergency Use Authorization (EUA). This EUA will remain in effect (meaning this test can be used) for the duration of the COVID-19 declaration under Section 564(b)(1) of the Act, 21 U.S.C. section 360bbb-3(b)(1), unless the authorization is terminated or revoked.     Resp Syncytial Virus by PCR NEGATIVE NEGATIVE Final    Comment: (NOTE) Fact Sheet for Patients: EntrepreneurPulse.com.au  Fact Sheet for Healthcare Providers: IncredibleEmployment.be  This test is not yet approved or cleared by the [place] FDA and has been authorized for detection and/or diagnosis of SARS-CoV-2 by FDA under an Emergency Use Authorization (EUA). This EUA will remain in effect (meaning this test can be used) for the duration of the COVID-19 declaration under Section 564(b)(1) of the Act, 21 U.S.C. section 360bbb-3(b)(1), unless the authorization is terminated or revoked.  Performed at [facility], [place], [place] [address]   C Difficile Quick Screen w PCR reflex     Status: None   Collection Time: 01/05/23  7:38 PM   Specimen: STOOL  Result Value Ref Range Status   C Diff antigen NEGATIVE NEGATIVE Final   C Diff toxin NEGATIVE NEGATIVE Final   C Diff interpretation No C. difficile detected.  Final    Comment: Performed at [facility], [address] [place], [place] [address]     Time coordinating  discharge: Over 30 minutes  SIGNED:   Shawna Clamp, MD  [facility] 01/08/2023, 3:54 PM Pager   If 7PM-7AM, please contact night-coverage

## 2023-01-08 NOTE — Plan of Care (Signed)
  Problem: Education: Goal: Ability to describe self-care measures that may prevent or decrease complications (Diabetes Survival Skills Education) will improve Outcome: Progressing Goal: Individualized Educational Video(s) Outcome: Progressing   Problem: Coping: Goal: Ability to adjust to condition or change in health will improve Outcome: Progressing   Problem: Fluid Volume: Goal: Ability to maintain a balanced intake and output will improve Outcome: Progressing   Problem: Health Behavior/Discharge Planning: Goal: Ability to identify and utilize available resources and services will improve Outcome: Progressing Goal: Ability to manage health-related needs will improve Outcome: Progressing   Problem: Metabolic: Goal: Ability to maintain appropriate glucose levels will improve Outcome: Progressing   Problem: Nutritional: Goal: Maintenance of adequate nutrition will improve Outcome: Progressing Goal: Progress toward achieving an optimal weight will improve Outcome: Progressing   Problem: Skin Integrity: Goal: Risk for impaired skin integrity will decrease Outcome: Progressing   Problem: Tissue Perfusion: Goal: Adequacy of tissue perfusion will improve Outcome: Progressing   Problem: Education: Goal: Knowledge of General Education information will improve Description: Including pain rating scale, medication(s)/side effects and non-pharmacologic comfort measures Outcome: Progressing   Problem: Health Behavior/Discharge Planning: Goal: Ability to manage health-related needs will improve Outcome: Progressing   Problem: Clinical Measurements: Goal: Ability to maintain clinical measurements within normal limits will improve Outcome: Progressing Goal: Will remain free from infection Outcome: Progressing Goal: Diagnostic test results will improve Outcome: Progressing Goal: Respiratory complications will improve Outcome: Progressing Goal: Cardiovascular complication will  be avoided Outcome: Progressing   Problem: Activity: Goal: Risk for activity intolerance will decrease Outcome: Progressing   Problem: Nutrition: Goal: Adequate nutrition will be maintained Outcome: Progressing   Problem: Pain Managment: Goal: General experience of comfort will improve Outcome: Progressing   Problem: Safety: Goal: Ability to remain free from injury will improve Outcome: Progressing

## 2023-01-08 NOTE — Progress Notes (Addendum)
Progress Note  Patient Name: [name] Date of Encounter: 01/08/2023  Primary Cardiologist: End Primary Electrophysiologist: Quentin Ore  Subjective   Transferred out of the ICU yesterday to progressive care. After discussion with EP yesterday, we were able to restart verapamil, at a lower dose 120 mg. No further bradycardia noted on tele. BP 130s to 308M mmHg systolic.   Inpatient Medications    Scheduled Meds:  amoxicillin-clavulanate  1 tablet Oral Q12H   apixaban  5 mg Oral Q12H   Chlorhexidine Gluconate Cloth  6 each Topical Daily   clonazePAM  0.5 mg Oral BID   furosemide  40 mg Oral Daily   insulin aspart  0-15 Units Subcutaneous Q4H   lamoTRIgine  150 mg Oral BID   levothyroxine  50 mcg Oral Q0600   loratadine  10 mg Oral Daily   montelukast  10 mg Oral QHS   pantoprazole  40 mg Oral Daily   potassium chloride  20 mEq Oral Daily   verapamil  120 mg Oral Daily   Continuous Infusions:  sodium chloride Stopped (01/07/23 1023)   PRN Meds: acetaminophen, albuterol, docusate sodium, olopatadine, ondansetron (ZOFRAN) IV, polyethylene glycol   Vital Signs    Vitals:   01/07/23 2026 01/07/23 2358 01/08/23 0410 01/08/23 0801  BP: 130/64 137/71 136/81 137/76  Pulse: 81 81 75 74  Resp: '20 20 18 18  '$ Temp: 98.8 F (37.1 C) 98.1 F (36.7 C) 97.9 F (36.6 C) 98.4 F (36.9 C)  TempSrc:    Oral  SpO2: 97% 97% 99% 97%  Weight:      Height:        Intake/Output Summary (Last 24 hours) at 01/08/2023 1117 Last data filed at 01/07/2023 1500 Gross per 24 hour  Intake 82.64 ml  Output 1 ml  Net 81.64 ml    Filed Weights   01/05/23 0500 01/06/23 0500 01/07/23 0413  Weight: (!) 169.4 kg (!) 167.9 kg (!) 168 kg    Telemetry    SR with artifact, 70s bpm - Personally Reviewed  ECG    No new tracings - Personally Reviewed  Physical Exam   GEN: No acute distress.   Neck: No JVD. Cardiac: RRR, no murmurs, rubs, or gallops.  Respiratory: Clear to auscultation  bilaterally.  GI: Soft, nontender, non-distended.   MS: No edema; No deformity. Neuro:  Alert and oriented x 3; Nonfocal.  Psych: Normal affect.  Labs    Chemistry Recent Labs  Lab 01/05/23 0340 01/05/23 2034 01/06/23 0505 01/07/23 0513  NA 135  --  135 137  K 3.1* 4.2 4.3 3.7  CL 101  --  102 100  CO2 27  --  26 26  GLUCOSE 166*  --  125* 123*  BUN 15  --  10 11  CREATININE 0.73  --  0.59 0.62  CALCIUM 8.6*  --  9.0 8.7*  GFRNONAA >60  --  >60 >60  ANIONGAP 7  --  7 11      Hematology Recent Labs  Lab 01/05/23 0340 01/06/23 0505 01/07/23 0513  WBC 20.7* 10.1 8.1  RBC 3.58* 3.48* 3.63*  HGB 10.5* 10.1* 10.5*  HCT 31.8* 31.2* 32.4*  MCV 88.8 89.7 89.3  MCH 29.3 29.0 28.9  MCHC 33.0 32.4 32.4  RDW 15.8* 16.1* 15.7*  PLT 329 257 283     Cardiac EnzymesNo results for input(s): "TROPONINI" in the last 168 hours. No results for input(s): "TROPIPOC" in the last 168 hours.   BNPNo  results for input(s): "BNP", "PROBNP" in the last 168 hours.   DDimer  Recent Labs  Lab 01/04/23 1854  DDIMER 0.35      Radiology    No results found.  Cardiac Studies   TTE 01/05/2023 1. Left ventricular ejection fraction, by estimation, is 60 to 65%. The  left ventricle has normal function. Left ventricular endocardial border  not optimally defined to evaluate regional wall motion. The left  ventricular internal cavity size was mildly  dilated. There is mild left ventricular hypertrophy. Left ventricular  diastolic parameters were normal.   2. Right ventricular systolic function is normal. The right ventricular  size is mildly enlarged. There is moderately elevated pulmonary artery  systolic pressure.   3. Right atrial size was mildly dilated.   4. The mitral valve is grossly normal. Trivial mitral valve  regurgitation.   5. The aortic valve was not well visualized. Aortic valve regurgitation  is not visualized. No aortic stenosis is present.   6. The inferior vena  cava is dilated in size with <50% respiratory  variability, suggesting right atrial pressure of 15 mmHg.  Coronary CTA 01/04/23 1. Normal coronary calcium score of 0.  Patient is low risk.   2. Normal coronary origin with right dominance.   3. No evidence of CAD.   4. CAD-RADS 0. Consider non-atherosclerotic causes of chest pain.   5. Image quality degraded by obesity related artifacts.   TTE 08/30/20 1. Left ventricular ejection fraction, by estimation, is 60 to 65%. The  left ventricle has normal function. The left ventricle has no regional  wall motion abnormalities. Left ventricular diastolic parameters were  normal.   2. Right ventricular systolic function is normal. The right ventricular  size is normal. Tricuspid regurgitation signal is inadequate for assessing  PA pressure.   3. Left atrial size was mildly dilated.   4. Right atrial size was mildly dilated.   5. Mild mitral valve regurgitation.   Patient Profile     62 y.o. female with history of HFpEF, PAF, hypertension, nonobstructive coronary artery disease, type II diabetes, morbid obesity, OSA, who is being seen and evaluated for symptomatic bradycardia and hypotension following cardiac CT.   Assessment & Plan    1. Symptomatic bradycardia with cardiogenic shock: -Resolved -Felt to be iatrogenic in the setting of beta blocker, calcium channel blocker, flecainide, and nitroglycerin administration for coronary CTA -Status post Glucagon   -Weaned off Levophed  2. PAF: -Maintaining sinus rhythm -Evaluated by EP with recommendation to hold flecainide and consider Tikosyn down the road -Not felt to be an ablation candidate due to BMI -After discussion with EP, she was restarted on lower dose verapamil 120 mg daily and has tolerated this well -We will increase verapamil to 120 mg bid -Could use short-acting verapamil 40 mg bid prn for sustained tachy-palpitations  -PTA Eliquis 5 mg bid, she does not meet reduced dosing  criteria -CHADS2VASc at least 4 (CHF, HTN, DM, sex category) -Follow up with EP as outpatient   3. HFpEF: -Appears well compensated -PTA Lasix, replete potassium as indicated  -Outpatient follow up  4. HTN: -Blood pressure improving, mildly elevated -Titrate verapamil as above -May need additional therapy -Follow up in the office     For questions or updates, please contact [facility] Please consult [email or web address] for contact info under Cardiology/STEMI.    Signed, Christell Faith, PA-C [facility] Pager: [phone] 01/08/2023, 11:17 AM

## 2023-01-10 LAB — CULTURE, BLOOD (ROUTINE X 2)
Culture: NO GROWTH
Culture: NO GROWTH
Special Requests: ADEQUATE

## 2023-01-10 LAB — NOROVIRUS GROUP 1 & 2 BY PCR, STOOL
Norovirus 1 by PCR: NEGATIVE
Norovirus 2  by PCR: NEGATIVE

## 2023-01-11 ENCOUNTER — Telehealth: Payer: Self-pay

## 2023-01-11 NOTE — Patient Outreach (Signed)
  Care Coordination TOC Note Transition Care Management Follow-up Telephone Call Date of discharge and from where: [facility] 01/08/63 How have you been since you were released from the hospital? "I am feeling okay, still not myself". Any questions or concerns? No  Items Reviewed: Did the pt receive and understand the discharge instructions provided? Yes  Medications obtained and verified? Yes  Other? No  Any new allergies since your discharge? No  Dietary orders reviewed? Yes Do you have support at home? Yes   Home Care and Equipment/Supplies: Were home health services ordered? no If so, what is the name of the agency? N/a  Has the agency set up a time to come to the patient's home? not applicable Were any new equipment or medical supplies ordered?  No What is the name of the medical supply agency? N/a Were you able to get the supplies/equipment? not applicable Do you have any questions related to the use of the equipment or supplies? No  Functional Questionnaire: (I = Independent and D = Dependent) ADLs: I  Bathing/Dressing- I  Meal Prep- I  Eating- I  Maintaining continence- I  Transferring/Ambulation- I  Managing Meds- I  Follow up appointments reviewed:  PCP Hospital f/u appt confirmed?  Non [facility] PCP   [facility] f/u appt confirmed? Yes  Scheduled to see Dr. Saunders Revel ([facility] clinic) on 01/26/23 @ 1:30. Are transportation arrangements needed? No  If their condition worsens, is the pt aware to call PCP or go to the Emergency Dept.? Yes Was the patient provided with contact information for the PCP's office or ED? Yes Was to pt encouraged to call back with questions or concerns? Yes  SDOH assessments and interventions completed:   Yes SDOH Interventions Today    Flowsheet Row Most Recent Value  SDOH Interventions   Housing Interventions Intervention Not Indicated  Transportation Interventions Intervention Not Indicated       Care Coordination Interventions:  No  Care Coordination interventions needed at this time.   Encounter Outcome:  Pt. Visit Completed

## 2023-01-13 ENCOUNTER — Ambulatory Visit: Payer: 59 | Admitting: Dietician

## 2023-01-14 ENCOUNTER — Encounter: Payer: Self-pay | Admitting: Internal Medicine

## 2023-01-14 DIAGNOSIS — Z79899 Other long term (current) drug therapy: Secondary | ICD-10-CM

## 2023-01-19 MED ORDER — LISINOPRIL 10 MG PO TABS: 10.0000 mg | ORAL_TABLET | Freq: Every day | ORAL | 3 refills | Status: DC

## 2023-01-19 NOTE — Addendum Note (Signed)
Addended by: Meryl Crutch on: 01/19/2023 07:41 AM   Modules accepted: Orders

## 2023-01-21 ENCOUNTER — Encounter: Payer: Self-pay | Admitting: Orthopaedic Surgery

## 2023-01-21 ENCOUNTER — Ambulatory Visit (INDEPENDENT_AMBULATORY_CARE_PROVIDER_SITE_OTHER): Payer: 59 | Admitting: Orthopaedic Surgery

## 2023-01-21 DIAGNOSIS — G8929 Other chronic pain: Secondary | ICD-10-CM | POA: Diagnosis not present

## 2023-01-21 DIAGNOSIS — M25562 Pain in left knee: Secondary | ICD-10-CM | POA: Diagnosis not present

## 2023-01-21 DIAGNOSIS — M25561 Pain in right knee: Secondary | ICD-10-CM

## 2023-01-21 MED ORDER — METHYLPREDNISOLONE ACETATE 40 MG/ML IJ SUSP
40.0000 mg | INTRAMUSCULAR | Status: AC | PRN
  Administered 2023-01-21: 40 mg via INTRA_ARTICULAR

## 2023-01-21 MED ORDER — LIDOCAINE HCL 1 % IJ SOLN
3.0000 mL | INTRAMUSCULAR | Status: AC | PRN
  Administered 2023-01-21: 3 mL

## 2023-01-21 NOTE — Progress Notes (Signed)
The patient is a 62 year old well-known to [place].  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  She comes in from time to time for steroid injections and always waits at least 3 months between steroid injections in her knees.  She is requesting steroid injections today.  She did let me know about her recent event where she had almost coded from some type of procedure but she is fine now.  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  She has had an injection in the A1 pulley by her PCP before.  She understands that only would agree to injections in both knees today and I should inject the A1 pulley maybe in about 2 weeks.  Both knees are assessed.  She has crepitation of both knees and medial joint line tenderness with varus malalignment.  I did place a steroid injection of both knees today which she tolerated well.  There is active triggering of her thumb and I like to see her back in 2 weeks for a steroid injection in her right thumb A1 pulley.  All question concerns were answered and addressed.  She agrees with this treatment plan.      Procedure Note  Patient: [name]             Date of Birth: 01-May-1961           MRN: [id number]             Visit Date: 01/21/2023  Procedures: Visit Diagnoses:  1. Chronic pain of left knee   2. Chronic pain of right knee     Large Joint Inj: R knee on 01/21/2023 2:38 PM Indications: diagnostic evaluation and pain Details: 22 G 1.5 in needle, superolateral approach  Arthrogram: No  Medications: 3 mL lidocaine 1 %; 40 mg methylPREDNISolone acetate 40 MG/ML Outcome: tolerated well, no immediate complications Procedure, treatment alternatives, risks and benefits explained, specific risks discussed. Consent was given by the patient. Immediately prior to procedure a time out was called to verify the correct patient, procedure, equipment, support staff and site/side marked as required. Patient was prepped and draped in the usual sterile fashion.     Large Joint Inj: L knee on 01/21/2023 2:38 PM Indications: diagnostic evaluation and pain Details: 22 G 1.5 in needle, superolateral approach  Arthrogram: No  Medications: 3 mL lidocaine 1 %; 40 mg methylPREDNISolone acetate 40 MG/ML Outcome: tolerated well, no immediate complications Procedure, treatment alternatives, risks and benefits explained, specific risks discussed. Consent was given by the patient. Immediately prior to procedure a time out was called to verify the correct patient, procedure, equipment, support staff and site/side marked as required. Patient was prepped and draped in the usual sterile fashion.

## 2023-01-26 ENCOUNTER — Encounter (HOSPITAL_COMMUNITY): Payer: Self-pay | Admitting: Physician Assistant

## 2023-01-26 ENCOUNTER — Telehealth: Payer: Self-pay | Admitting: Pharmacist

## 2023-01-26 ENCOUNTER — Ambulatory Visit (HOSPITAL_COMMUNITY)
Admit: 2023-01-26 | Discharge: 2023-01-26 | Disposition: A | Discharge status: Home / Self Care | Payer: 59 | Attending: Physician Assistant | Admitting: Physician Assistant

## 2023-01-26 VITALS — BP 164/80 | HR 78 | Ht 65.0 in | Wt 363.0 lb

## 2023-01-26 DIAGNOSIS — I5032 Chronic diastolic (congestive) heart failure: Secondary | ICD-10-CM | POA: Insufficient documentation

## 2023-01-26 DIAGNOSIS — I480 Paroxysmal atrial fibrillation: Secondary | ICD-10-CM | POA: Diagnosis not present

## 2023-01-26 DIAGNOSIS — Z6841 Body Mass Index (BMI) 40.0 and over, adult: Secondary | ICD-10-CM | POA: Insufficient documentation

## 2023-01-26 DIAGNOSIS — D6869 Other thrombophilia: Secondary | ICD-10-CM | POA: Diagnosis not present

## 2023-01-26 DIAGNOSIS — I110 Hypertensive heart disease with heart failure: Secondary | ICD-10-CM | POA: Diagnosis not present

## 2023-01-26 DIAGNOSIS — E119 Type 2 diabetes mellitus without complications: Secondary | ICD-10-CM | POA: Insufficient documentation

## 2023-01-26 DIAGNOSIS — I2510 Atherosclerotic heart disease of native coronary artery without angina pectoris: Secondary | ICD-10-CM | POA: Diagnosis not present

## 2023-01-26 MED ORDER — DILTIAZEM HCL ER COATED BEADS 180 MG PO CP24: 180.0000 mg | ORAL_CAPSULE | Freq: Every day | ORAL | 3 refills | Status: DC

## 2023-01-26 MED ORDER — DILTIAZEM HCL ER COATED BEADS 120 MG PO CP24: 120.0000 mg | ORAL_CAPSULE | Freq: Every day | ORAL | 3 refills | Status: DC

## 2023-01-26 NOTE — Progress Notes (Signed)
Primary Care Physician: Hayden Rasmussen, MD Primary Cardiologist: Dr End Primary Electrophysiologist: Dr Quentin Ore Referring Physician: Dr Martyn Malay is a 62 y.o. female with a history of HFpEF, HTN, CAD, DM, OSA, atrial fibrillation who presents for consultation in the [facility].  The patient was initially diagnosed with atrial fibrillation in 2015 and has been maintained on verapamil and flecainide for many years. She presented for coronary CTA. Prior to the CTA she took 100 mg of Lopressor as instructed. At the coronary CTA procedure her heart rate was greater than 60 bpm so she was given an additional 10 mg of IV Lopressor. This led to bradycardia and hypotension. IV fluids were administered and she was transported to the ER. There she was found to be in a junctional rhythm in the 30s. She was admitted to the ICU where she required epinephrine and then norepinephrine for hemodynamic support. Her flecainide was discontinued, not felt to be a good candidate for multiple instances of decompensated CHF. Patient is on Eliquis for a CHADS2VASC score of 5. She remains in SR today. No bleeding issues on anticoagulation.   Today, she denies symptoms of palpitations, chest pain, shortness of breath, orthopnea, PND, lower extremity edema, dizziness, presyncope, syncope, bleeding, or neurologic sequela. The patient is tolerating medications without difficulties and is otherwise without complaint today.    Atrial Fibrillation Risk Factors:  she does have symptoms or diagnosis of sleep apnea. she is compliant with CPAP therapy. she does not have a history of rheumatic fever.   she has a BMI of Body mass index is 60.41 kg/m.Marland Kitchen Filed Weights   01/26/23 1318  Weight: (!) 164.7 kg    Family History  Problem Relation Age of Onset   Heart Problems Mother    Hypertension Mother    Angina Mother    Atrial fibrillation Mother    Heart Problems Father     Hypertension Father    Kidney Stones Sister    Kidney cancer Neg Hx    Prostate cancer Neg Hx    Bladder Cancer Neg Hx      Atrial Fibrillation Management history:  Previous antiarrhythmic drugs: flecainide  Previous cardioversions: none Previous ablations: none CHADS2VASC score: 5 Anticoagulation history: Eliquis   Past Medical History:  Diagnosis Date   Anxiety 08/23/2014   Chronic heart failure with preserved ejection fraction (HFpEF) ([place])    a. 02/2000 MUGA: EF 68%; b. 06/2005 Echo: EF 55-65%; c. 06/2017 Echo: EF 60-65%; d. 08/2020 Echo: EF 60-65%, no rwma, nl RV fxn. Mild BAE. Mild MR.   Current moderate episode of major depressive disorder without prior episode ([place]) 12/31/2017   Dependent edema 10/05/2011   DNR (do not resuscitate) 12/06/2022   GERD (gastroesophageal reflux disease) 12/22/1999   Formatting of this note might be different from the original. Controlled by OTC meds   History of breast cancer 10/05/2011   Hypertension 10/05/2011   Long term current use of anticoagulant therapy 08/29/2014   Mild persistent asthma without complication 24/23/5361   Morbid obesity with BMI of 60.0-69.9, adult ([place]) 09/06/2018   Non-obstructive CAD (coronary artery disease)    a. 12/2017 Lexiscan PET/CT: mid anterior, apical lateral, and apical ischemia; b. 01/2018 Cath: LM nl, LAD & LCX 10-30% diff dzs throughout, RCA min irregs (<10%)-->Med rx.   Obstructive sleep apnea, adult 09/06/2018   Paroxysmal atrial fibrillation ([place]) 04/21/2019   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/2014   Type 2 diabetes mellitus  without complication, without long-term current use of insulin ([place]) 04/21/2019   Urinary bladder incontinence 06/13/2013   Past Surgical History:  Procedure Laterality Date   CARDIAC CATHETERIZATION  02/16/2018   No stents;Dr. Collene Mares[place] [facility]   COLONOSCOPY WITH PROPOFOL N/A 11/28/2021   Procedure: COLONOSCOPY WITH PROPOFOL;  Surgeon: Carol Ada,  MD;  Location: [facility] ENDOSCOPY;  Service: Endoscopy;  Laterality: N/A;   FINGER SURGERY Right    MASTECTOMY Right     Current Outpatient Medications  Medication Sig Dispense Refill   albuterol (PROVENTIL HFA;VENTOLIN HFA) 108 (90 Base) MCG/ACT inhaler Inhale 2 puffs into the lungs every 6 (six) hours as needed for wheezing or shortness of breath. 1 Inhaler 2   apixaban (ELIQUIS) 5 MG TABS tablet Take 1 tablet (5 mg total) by mouth every 12 (twelve) hours. 60 tablet 11   Cholecalciferol 125 MCG (5000 UT) CHEW Chew 5,000 Units by mouth daily.     clonazePAM (KLONOPIN) 1 MG tablet Take 1 mg by mouth every 12 (twelve) hours.     fexofenadine (ALLEGRA) 180 MG tablet Take 180 mg by mouth daily.     lamoTRIgine (LAMICTAL) 100 MG tablet Take 150 mg by mouth every 12 (twelve) hours.     levothyroxine (SYNTHROID) 50 MCG tablet Take 50 mcg by mouth daily before breakfast.     lisinopril (ZESTRIL) 10 MG tablet Take 1 tablet (10 mg total) by mouth daily. 90 tablet 3   metFORMIN (GLUCOPHAGE) 500 MG tablet Take 1,000 mg by mouth 2 (two) times daily.     montelukast (SINGULAIR) 10 MG tablet Take 10 mg by mouth at bedtime.     Olopatadine HCl (PATADAY OP) Place 1 drop into both eyes daily.     omeprazole (PRILOSEC) 20 MG capsule Take 20 mg by mouth every 12 (twelve) hours.     promethazine (PHENERGAN) 12.5 MG tablet Take 12.5 mg by mouth every 8 (eight) hours as needed for nausea or vomiting.     verapamil (CALAN) 40 MG tablet Take 1 tablet (40 mg total) by mouth 2 (two) times daily as needed (palpitations). 60 tablet 1   verapamil (CALAN-SR) 120 MG CR tablet Take 1 tablet (120 mg total) by mouth 2 (two) times daily. 60 tablet 1   furosemide (LASIX) 40 MG tablet Take 1 tablet (40 mg total) by mouth daily for 1 day. 30 tablet    No current facility-administered medications for this encounter.    Allergies  Allergen Reactions   Erythromycin Diarrhea    Social History   Socioeconomic History   Marital  status: Married    Spouse name: Not on file   Number of children: Not on file   Years of education: Not on file   Highest education level: Not on file  Occupational History   Not on file  Tobacco Use   Smoking status: Former    Packs/day: 0.30    Years: 4.00    Total pack years: 1.20    Types: Cigarettes    Quit date: 12/22/1983    Years since quitting: 39.1   Smokeless tobacco: Never   Tobacco comments:    Former smoker 01/26/23  Vaping Use   Vaping Use: Never used  Substance and Sexual Activity   Alcohol use: Yes    Comment: "not enough"   Drug use: No   Sexual activity: Not on file  Other Topics Concern   Not on file  Social History Narrative   Not on file  Social Determinants of Health   Financial Resource Strain: Not on file  Food Insecurity: No Food Insecurity (01/04/2023)   Hunger Vital Sign    Worried About Running Out of Food in the Last Year: Never true    Ran Out of Food in the Last Year: Never true  Transportation Needs: No Transportation Needs (01/11/2023)   PRAPARE - Hydrologist (Medical): No    Lack of Transportation (Non-Medical): No  Physical Activity: Not on file  Stress: Not on file  Social Connections: Not on file  Intimate Partner Violence: Not At Risk (01/04/2023)   Humiliation, Afraid, Rape, and Kick questionnaire    Fear of Current or Ex-Partner: No    Emotionally Abused: No    Physically Abused: No    Sexually Abused: No     ROS- All systems are reviewed and negative except as per the HPI above.  Physical Exam: Vitals:   01/26/23 1318  BP: (!) 164/80  Pulse: 78  Weight: (!) 164.7 kg  Height: '5\' 5"'$  (1.651 m)    GEN- The patient is a well appearing obese female, alert and oriented x 3 today.   Head- normocephalic, atraumatic Eyes-  Sclera clear, conjunctiva pink Ears- hearing intact Oropharynx- clear Neck- supple  Lungs- Clear to ausculation bilaterally, normal work of breathing Heart- Regular rate  and rhythm, no murmurs, rubs or gallops  GI- soft, NT, ND, + BS Extremities- no clubbing, cyanosis, or edema MS- no significant deformity or atrophy Skin- no rash or lesion Psych- euthymic mood, full affect Neuro- strength and sensation are intact  Wt Readings from Last 3 Encounters:  01/26/23 (!) 164.7 kg  01/07/23 (!) 168 kg  12/07/22 (!) 165.5 kg    EKG today demonstrates  SR Vent. rate 78 BPM PR interval 144 ms QRS duration 88 ms QT/QTcB 378/430 ms  Echo 01/05/23 demonstrated   1. Left ventricular ejection fraction, by estimation, is 60 to 65%. The  left ventricle has normal function. Left ventricular endocardial border  not optimally defined to evaluate regional wall motion. The left  ventricular internal cavity size was mildly dilated. There is mild left ventricular hypertrophy. Left ventricular diastolic parameters were normal.   2. Right ventricular systolic function is normal. The right ventricular  size is mildly enlarged. There is moderately elevated pulmonary artery  systolic pressure.   3. Right atrial size was mildly dilated.   4. The mitral valve is grossly normal. Trivial mitral valve  regurgitation.   5. The aortic valve was not well visualized. Aortic valve regurgitation  is not visualized. No aortic stenosis is present.   6. The inferior vena cava is dilated in size with <50% respiratory  variability, suggesting right atrial pressure of 15 mmHg.   Epic records are reviewed at length today  CHA2DS2-VASc Score = 5  The patient's score is based upon: CHF History: 1 HTN History: 1 Diabetes History: 1 Stroke History: 0 Vascular Disease History: 1 Age Score: 0 Gender Score: 1       ASSESSMENT AND PLAN: 1. Paroxysmal Atrial Fibrillation (ICD10:  I48.0) The patient's CHA2DS2-VASc score is 5, indicating a 7.2% annual risk of stroke.   Patient now off flecainide, not felt to be a good candidate with frequent CHF. We discussed rhythm control options  today. Patient would like to pursue dofetilide admission. Continue Eliquis 5 mg BID, states no missed doses in the last 3 weeks. No recent benadryl use PharmD to screen medications. Will change verapamil  to diltiazem 180 mg daily. She plans to discuss lamotrigine with her PCP to see if this can be changed. If she cannot stop lamotrigine, amiodarone may be her only other option.  QTc in SR 430 ms Recent lab work reviewed.   2. Secondary Hypercoagulable State (ICD10:  D68.69) The patient is at significant risk for stroke/thromboembolism based upon her CHA2DS2-VASc Score of 5.  Continue Apixaban (Eliquis).   3. Obesity Body mass index is 60.41 kg/m. Lifestyle modification was discussed at length including regular exercise and weight reduction.  4. Obstructive sleep apnea The importance of adequate treatment of sleep apnea was discussed today in order to improve our ability to maintain sinus rhythm long term. Encouraged compliance with CPAP therapy.   5. Chronic HFpEF Does not appear acutely fluid overloaded today. Fluid status difficult to assess with body habitus.  On Lasix 40 mg daily  6. HTN Elevated today, her lisinopril was held during her last hospitalization.  Resumed at a lower dose. She has follow up with Dr End in two weeks to discuss.    Follow up in the AF clinic for dofetilide loading.    [facility] [address] [place],  [address] [phone] 01/26/2023 1:45 PM

## 2023-01-26 NOTE — Telephone Encounter (Signed)
Medication list reviewed in anticipation of upcoming Tikosyn initiation. Patient is taking 2 contraindicated or QTc prolonging medications.   Lamotrigine may cause increased risk of QT prolongation with Dofetilide  Coadministration of lamotrigine and dofetilide is not recommended. Coadministration may decrease dofetilide clearance, resulting in increased plasma concentrations and the potential for serious adverse events, including QT prolongation and cardiac arrhythmias. Lamotrigine is an inhibitor of renal tubular secretion via organic cationic transporter 2 (OCT2) proteins, and dofetilide is excreted via this route.   Concurrent use of DOFETILIDE and PROMETHAZINE may result in an increased risk of QT interval prolongation. Patient should not take promethazine in days before admission.  Patient is anticoagulated on Eliquis on the appropriate dose. Please ensure that patient has not missed any anticoagulation doses in the 3 weeks prior to Tikosyn initiation.   Patient will need to be counseled to avoid use of Benadryl while on Tikosyn and in the 2-3 days prior to Tikosyn initiation.

## 2023-01-26 NOTE — Patient Instructions (Addendum)
Stop verapamil (including as needed dose)  Start Cardizem '180mg'$  once a day   Discuss Lamactil with primary care -- update Velecia Ovitt with replacement [phone]

## 2023-01-27 NOTE — Telephone Encounter (Signed)
Pt spoke with PCP office regarding stopping lamictal. They will wean over the next 3 weeks and will start wellbutrin.

## 2023-01-29 ENCOUNTER — Telehealth (HOSPITAL_COMMUNITY): Payer: Self-pay

## 2023-01-29 NOTE — Telephone Encounter (Signed)
Initiated Prior Authorization with [employer] for in patient stay for [place] Admission. Date of service 02/22/2023.  Authorization is pending. ID # [id number]

## 2023-01-30 DIAGNOSIS — E669 Obesity, unspecified: Secondary | ICD-10-CM | POA: Diagnosis not present

## 2023-01-30 DIAGNOSIS — I4891 Unspecified atrial fibrillation: Secondary | ICD-10-CM | POA: Diagnosis not present

## 2023-01-30 DIAGNOSIS — E119 Type 2 diabetes mellitus without complications: Secondary | ICD-10-CM | POA: Diagnosis not present

## 2023-01-30 DIAGNOSIS — F419 Anxiety disorder, unspecified: Secondary | ICD-10-CM | POA: Diagnosis not present

## 2023-01-30 DIAGNOSIS — Z7984 Long term (current) use of oral hypoglycemic drugs: Secondary | ICD-10-CM | POA: Diagnosis not present

## 2023-01-30 DIAGNOSIS — Z6841 Body Mass Index (BMI) 40.0 and over, adult: Secondary | ICD-10-CM | POA: Diagnosis not present

## 2023-01-30 DIAGNOSIS — G4733 Obstructive sleep apnea (adult) (pediatric): Secondary | ICD-10-CM | POA: Diagnosis not present

## 2023-01-30 DIAGNOSIS — I110 Hypertensive heart disease with heart failure: Secondary | ICD-10-CM | POA: Diagnosis not present

## 2023-01-30 DIAGNOSIS — Z7901 Long term (current) use of anticoagulants: Secondary | ICD-10-CM | POA: Diagnosis not present

## 2023-01-30 DIAGNOSIS — I5032 Chronic diastolic (congestive) heart failure: Secondary | ICD-10-CM | POA: Diagnosis not present

## 2023-01-30 DIAGNOSIS — J449 Chronic obstructive pulmonary disease, unspecified: Secondary | ICD-10-CM | POA: Diagnosis not present

## 2023-01-30 DIAGNOSIS — I480 Paroxysmal atrial fibrillation: Secondary | ICD-10-CM | POA: Diagnosis not present

## 2023-01-30 DIAGNOSIS — F331 Major depressive disorder, recurrent, moderate: Secondary | ICD-10-CM | POA: Diagnosis not present

## 2023-02-01 ENCOUNTER — Telehealth (HOSPITAL_COMMUNITY): Payer: Self-pay

## 2023-02-01 ENCOUNTER — Encounter: Payer: Self-pay | Admitting: Internal Medicine

## 2023-02-01 DIAGNOSIS — Z7901 Long term (current) use of anticoagulants: Secondary | ICD-10-CM | POA: Diagnosis not present

## 2023-02-01 DIAGNOSIS — F331 Major depressive disorder, recurrent, moderate: Secondary | ICD-10-CM | POA: Diagnosis not present

## 2023-02-01 DIAGNOSIS — F419 Anxiety disorder, unspecified: Secondary | ICD-10-CM | POA: Diagnosis not present

## 2023-02-01 DIAGNOSIS — I5032 Chronic diastolic (congestive) heart failure: Secondary | ICD-10-CM | POA: Diagnosis not present

## 2023-02-01 DIAGNOSIS — I110 Hypertensive heart disease with heart failure: Secondary | ICD-10-CM | POA: Diagnosis not present

## 2023-02-01 DIAGNOSIS — J449 Chronic obstructive pulmonary disease, unspecified: Secondary | ICD-10-CM | POA: Diagnosis not present

## 2023-02-01 DIAGNOSIS — E669 Obesity, unspecified: Secondary | ICD-10-CM | POA: Diagnosis not present

## 2023-02-01 DIAGNOSIS — I4891 Unspecified atrial fibrillation: Secondary | ICD-10-CM | POA: Diagnosis not present

## 2023-02-01 DIAGNOSIS — Z6841 Body Mass Index (BMI) 40.0 and over, adult: Secondary | ICD-10-CM | POA: Diagnosis not present

## 2023-02-01 DIAGNOSIS — G4733 Obstructive sleep apnea (adult) (pediatric): Secondary | ICD-10-CM | POA: Diagnosis not present

## 2023-02-01 NOTE — Telephone Encounter (Signed)
[employer] has approved [place] Admission. Date of service 02/22/2023- 07/31/2023.  Authorization #- [id number]

## 2023-02-02 DIAGNOSIS — F419 Anxiety disorder, unspecified: Secondary | ICD-10-CM | POA: Diagnosis not present

## 2023-02-02 DIAGNOSIS — I5032 Chronic diastolic (congestive) heart failure: Secondary | ICD-10-CM | POA: Diagnosis not present

## 2023-02-02 DIAGNOSIS — I110 Hypertensive heart disease with heart failure: Secondary | ICD-10-CM | POA: Diagnosis not present

## 2023-02-02 DIAGNOSIS — F331 Major depressive disorder, recurrent, moderate: Secondary | ICD-10-CM | POA: Diagnosis not present

## 2023-02-02 DIAGNOSIS — G4733 Obstructive sleep apnea (adult) (pediatric): Secondary | ICD-10-CM | POA: Diagnosis not present

## 2023-02-02 DIAGNOSIS — I4891 Unspecified atrial fibrillation: Secondary | ICD-10-CM | POA: Diagnosis not present

## 2023-02-02 DIAGNOSIS — Z7901 Long term (current) use of anticoagulants: Secondary | ICD-10-CM | POA: Diagnosis not present

## 2023-02-02 DIAGNOSIS — J449 Chronic obstructive pulmonary disease, unspecified: Secondary | ICD-10-CM | POA: Diagnosis not present

## 2023-02-02 DIAGNOSIS — E669 Obesity, unspecified: Secondary | ICD-10-CM | POA: Diagnosis not present

## 2023-02-02 DIAGNOSIS — Z6841 Body Mass Index (BMI) 40.0 and over, adult: Secondary | ICD-10-CM | POA: Diagnosis not present

## 2023-02-04 ENCOUNTER — Other Ambulatory Visit
Admission: RE | Admit: 2023-02-04 | Discharge: 2023-02-04 | Disposition: A | Discharge status: Home / Self Care | Payer: 59 | Source: Ambulatory Visit | Attending: Internal Medicine | Admitting: Internal Medicine

## 2023-02-04 DIAGNOSIS — I5032 Chronic diastolic (congestive) heart failure: Secondary | ICD-10-CM | POA: Diagnosis not present

## 2023-02-04 DIAGNOSIS — Z79899 Other long term (current) drug therapy: Secondary | ICD-10-CM | POA: Insufficient documentation

## 2023-02-04 DIAGNOSIS — F4312 Post-traumatic stress disorder, chronic: Secondary | ICD-10-CM | POA: Diagnosis not present

## 2023-02-04 DIAGNOSIS — J449 Chronic obstructive pulmonary disease, unspecified: Secondary | ICD-10-CM | POA: Diagnosis not present

## 2023-02-04 DIAGNOSIS — F419 Anxiety disorder, unspecified: Secondary | ICD-10-CM | POA: Diagnosis not present

## 2023-02-04 DIAGNOSIS — E669 Obesity, unspecified: Secondary | ICD-10-CM | POA: Diagnosis not present

## 2023-02-04 DIAGNOSIS — Z6841 Body Mass Index (BMI) 40.0 and over, adult: Secondary | ICD-10-CM | POA: Diagnosis not present

## 2023-02-04 DIAGNOSIS — G4733 Obstructive sleep apnea (adult) (pediatric): Secondary | ICD-10-CM | POA: Diagnosis not present

## 2023-02-04 DIAGNOSIS — Z7901 Long term (current) use of anticoagulants: Secondary | ICD-10-CM | POA: Diagnosis not present

## 2023-02-04 DIAGNOSIS — F411 Generalized anxiety disorder: Secondary | ICD-10-CM | POA: Diagnosis not present

## 2023-02-04 DIAGNOSIS — R69 Illness, unspecified: Secondary | ICD-10-CM | POA: Diagnosis not present

## 2023-02-04 DIAGNOSIS — F331 Major depressive disorder, recurrent, moderate: Secondary | ICD-10-CM | POA: Diagnosis not present

## 2023-02-04 DIAGNOSIS — I4891 Unspecified atrial fibrillation: Secondary | ICD-10-CM | POA: Diagnosis not present

## 2023-02-04 DIAGNOSIS — I110 Hypertensive heart disease with heart failure: Secondary | ICD-10-CM | POA: Diagnosis not present

## 2023-02-04 LAB — BASIC METABOLIC PANEL
Anion gap: 10 (ref 5–15)
BUN: 14 mg/dL (ref 8–23)
CO2: 28 mmol/L (ref 22–32)
Calcium: 9 mg/dL (ref 8.9–10.3)
Chloride: 98 mmol/L (ref 98–111)
Creatinine, Ser: 0.61 mg/dL (ref 0.44–1.00)
GFR, Estimated: >60 mL/min (ref >60)
Glucose, Bld: 147 mg/dL — ABNORMAL HIGH (ref 70–99)
Potassium: 3.9 mmol/L (ref 3.5–5.1)
Sodium: 136 mmol/L (ref 135–145)

## 2023-02-08 ENCOUNTER — Ambulatory Visit (INDEPENDENT_AMBULATORY_CARE_PROVIDER_SITE_OTHER): Payer: 59 | Admitting: Orthopaedic Surgery

## 2023-02-08 ENCOUNTER — Encounter: Payer: Self-pay | Admitting: Orthopaedic Surgery

## 2023-02-08 DIAGNOSIS — M65311 Trigger thumb, right thumb: Secondary | ICD-10-CM

## 2023-02-08 MED ORDER — METHYLPREDNISOLONE ACETATE 40 MG/ML IJ SUSP
40.0000 mg | INTRAMUSCULAR | Status: AC | PRN
  Administered 2023-02-08: 40 mg

## 2023-02-08 MED ORDER — LIDOCAINE HCL 1 % IJ SOLN
0.5000 mL | INTRAMUSCULAR | Status: AC | PRN
  Administered 2023-02-08: .5 mL

## 2023-02-08 NOTE — Progress Notes (Signed)
The patient is well-known to me.  When I saw her at her last visit we injected both of her knees with a steroid she said that is helped great.  She was having right trigger thumb and we needed to hold off on another injection since she is diabetic.  She has had no adverse effect that she is aware of of the steroid injections in her knees.  She does have active triggering of her right thumb.  There is pain over the A1 pulley.  The remainder of her hand exam is normal.  I did place a steroid injection around her A1 pulley of the right thumb without difficulty.  She should try Voltaren gel with a small amount twice daily on this area.  If the triggering recurs she knows to let us know.  All question concerns were answered and addressed.  Follow-up is as needed.      Procedure Note  Patient: [name]             Date of Birth: 22-Dec-1960           MRN: [id number]             Visit Date: 02/08/2023  Procedures: Visit Diagnoses: No diagnosis found.  Hand/UE Inj: R thumb A1 for trigger finger on 02/08/2023 10:41 AM Medications: 0.5 mL lidocaine 1 %; 40 mg methylPREDNISolone acetate 40 MG/ML

## 2023-02-09 DIAGNOSIS — I4891 Unspecified atrial fibrillation: Secondary | ICD-10-CM | POA: Diagnosis not present

## 2023-02-09 DIAGNOSIS — F331 Major depressive disorder, recurrent, moderate: Secondary | ICD-10-CM | POA: Diagnosis not present

## 2023-02-09 DIAGNOSIS — E669 Obesity, unspecified: Secondary | ICD-10-CM | POA: Diagnosis not present

## 2023-02-09 DIAGNOSIS — Z6841 Body Mass Index (BMI) 40.0 and over, adult: Secondary | ICD-10-CM | POA: Diagnosis not present

## 2023-02-09 DIAGNOSIS — F419 Anxiety disorder, unspecified: Secondary | ICD-10-CM | POA: Diagnosis not present

## 2023-02-09 DIAGNOSIS — J449 Chronic obstructive pulmonary disease, unspecified: Secondary | ICD-10-CM | POA: Diagnosis not present

## 2023-02-09 DIAGNOSIS — I5032 Chronic diastolic (congestive) heart failure: Secondary | ICD-10-CM | POA: Diagnosis not present

## 2023-02-09 DIAGNOSIS — Z7901 Long term (current) use of anticoagulants: Secondary | ICD-10-CM | POA: Diagnosis not present

## 2023-02-09 DIAGNOSIS — G4733 Obstructive sleep apnea (adult) (pediatric): Secondary | ICD-10-CM | POA: Diagnosis not present

## 2023-02-09 DIAGNOSIS — I110 Hypertensive heart disease with heart failure: Secondary | ICD-10-CM | POA: Diagnosis not present

## 2023-02-10 DIAGNOSIS — I5032 Chronic diastolic (congestive) heart failure: Secondary | ICD-10-CM | POA: Diagnosis not present

## 2023-02-10 DIAGNOSIS — E669 Obesity, unspecified: Secondary | ICD-10-CM | POA: Diagnosis not present

## 2023-02-10 DIAGNOSIS — F331 Major depressive disorder, recurrent, moderate: Secondary | ICD-10-CM | POA: Diagnosis not present

## 2023-02-10 DIAGNOSIS — Z7901 Long term (current) use of anticoagulants: Secondary | ICD-10-CM | POA: Diagnosis not present

## 2023-02-10 DIAGNOSIS — J449 Chronic obstructive pulmonary disease, unspecified: Secondary | ICD-10-CM | POA: Diagnosis not present

## 2023-02-10 DIAGNOSIS — I4891 Unspecified atrial fibrillation: Secondary | ICD-10-CM | POA: Diagnosis not present

## 2023-02-10 DIAGNOSIS — F419 Anxiety disorder, unspecified: Secondary | ICD-10-CM | POA: Diagnosis not present

## 2023-02-10 DIAGNOSIS — Z6841 Body Mass Index (BMI) 40.0 and over, adult: Secondary | ICD-10-CM | POA: Diagnosis not present

## 2023-02-10 DIAGNOSIS — G4733 Obstructive sleep apnea (adult) (pediatric): Secondary | ICD-10-CM | POA: Diagnosis not present

## 2023-02-10 DIAGNOSIS — I110 Hypertensive heart disease with heart failure: Secondary | ICD-10-CM | POA: Diagnosis not present

## 2023-02-11 DIAGNOSIS — Z6841 Body Mass Index (BMI) 40.0 and over, adult: Secondary | ICD-10-CM | POA: Diagnosis not present

## 2023-02-11 DIAGNOSIS — E669 Obesity, unspecified: Secondary | ICD-10-CM | POA: Diagnosis not present

## 2023-02-11 DIAGNOSIS — I110 Hypertensive heart disease with heart failure: Secondary | ICD-10-CM | POA: Diagnosis not present

## 2023-02-11 DIAGNOSIS — J449 Chronic obstructive pulmonary disease, unspecified: Secondary | ICD-10-CM | POA: Diagnosis not present

## 2023-02-11 DIAGNOSIS — F331 Major depressive disorder, recurrent, moderate: Secondary | ICD-10-CM | POA: Diagnosis not present

## 2023-02-11 DIAGNOSIS — F419 Anxiety disorder, unspecified: Secondary | ICD-10-CM | POA: Diagnosis not present

## 2023-02-11 DIAGNOSIS — Z7901 Long term (current) use of anticoagulants: Secondary | ICD-10-CM | POA: Diagnosis not present

## 2023-02-11 DIAGNOSIS — G4733 Obstructive sleep apnea (adult) (pediatric): Secondary | ICD-10-CM | POA: Diagnosis not present

## 2023-02-11 DIAGNOSIS — I5032 Chronic diastolic (congestive) heart failure: Secondary | ICD-10-CM | POA: Diagnosis not present

## 2023-02-11 DIAGNOSIS — I4891 Unspecified atrial fibrillation: Secondary | ICD-10-CM | POA: Diagnosis not present

## 2023-02-12 ENCOUNTER — Ambulatory Visit: Payer: 59 | Attending: Internal Medicine | Admitting: Internal Medicine

## 2023-02-12 ENCOUNTER — Telehealth (HOSPITAL_COMMUNITY): Payer: Self-pay | Admitting: *Deleted

## 2023-02-12 ENCOUNTER — Encounter: Payer: Self-pay | Admitting: Internal Medicine

## 2023-02-12 VITALS — BP 128/68 | HR 83 | Ht 65.0 in | Wt 352.1 lb

## 2023-02-12 DIAGNOSIS — I5032 Chronic diastolic (congestive) heart failure: Secondary | ICD-10-CM

## 2023-02-12 DIAGNOSIS — F331 Major depressive disorder, recurrent, moderate: Secondary | ICD-10-CM | POA: Diagnosis not present

## 2023-02-12 DIAGNOSIS — I480 Paroxysmal atrial fibrillation: Secondary | ICD-10-CM | POA: Diagnosis not present

## 2023-02-12 DIAGNOSIS — F419 Anxiety disorder, unspecified: Secondary | ICD-10-CM | POA: Diagnosis not present

## 2023-02-12 DIAGNOSIS — I110 Hypertensive heart disease with heart failure: Secondary | ICD-10-CM | POA: Diagnosis not present

## 2023-02-12 DIAGNOSIS — R072 Precordial pain: Secondary | ICD-10-CM | POA: Diagnosis not present

## 2023-02-12 DIAGNOSIS — G4733 Obstructive sleep apnea (adult) (pediatric): Secondary | ICD-10-CM | POA: Diagnosis not present

## 2023-02-12 DIAGNOSIS — J449 Chronic obstructive pulmonary disease, unspecified: Secondary | ICD-10-CM | POA: Diagnosis not present

## 2023-02-12 DIAGNOSIS — Z7901 Long term (current) use of anticoagulants: Secondary | ICD-10-CM | POA: Diagnosis not present

## 2023-02-12 DIAGNOSIS — E669 Obesity, unspecified: Secondary | ICD-10-CM | POA: Diagnosis not present

## 2023-02-12 DIAGNOSIS — Z6841 Body Mass Index (BMI) 40.0 and over, adult: Secondary | ICD-10-CM | POA: Diagnosis not present

## 2023-02-12 DIAGNOSIS — I4891 Unspecified atrial fibrillation: Secondary | ICD-10-CM | POA: Diagnosis not present

## 2023-02-12 MED ORDER — LISINOPRIL 10 MG PO TABS: 10.0000 mg | ORAL_TABLET | Freq: Every day | ORAL | 3 refills | Status: DC

## 2023-02-12 NOTE — Telephone Encounter (Signed)
Authorization for [place] hospital admission approved for 02/22/23. Josem Kaufmann number is [id number]

## 2023-02-12 NOTE — Progress Notes (Unsigned)
Follow-up Outpatient Visit Date: 02/12/2023  Primary Care Provider: Hayden Rasmussen, [address] [place] [address]  Chief Complaint: Follow-up atrial fibrillation and HFpEF  HPI:  [name] is a 62 y.o. female with history of paroxysmal atrial fibrillation, chronic HFpEF, hypertension, type 2 diabetes mellitus, breast cancer, morbid obesity, and obstructive sleep apnea, who presents for follow-up of HFpEF and atrial fibrillation.  She was last seen in our office in early December by Ignacia Bayley, NP, following 3 ED visits in the preceding 6 weeks for chest pain, palpitations, and elevated blood pressure.  She was referred for coronary CTA, which showed no significant CAD or coronary artery calcification.  However, the CTA was complicated by persistent hypotension and bradycardia requiring ICU admission, vasopressor support, and intravenous glucagon infusion.  She was seen by Dr. Quentin Ore, who recommended avoidance of flecainide and close outpatient follow-up with the atrial fibrillation clinic to discuss other rhythm control strategies.  She was seen most recently on 01/26/2023 by Adline Peals, PA, at which time decision was made to move forward with dofetilide loading.  Today, [name] reports that she is actually feeling fairly well.  She remains confused about a lot of what occurred around the time of her coronary CTA and subsequent ICU admission last month.  She has not had any chest pain, palpitations, or lightheadedness.  She is working with physical and Occupational Therapy at home and has noticed improvements in her balance and strength.  She has minimal exertional dyspnea.  Chronic bilateral leg swelling is stable.  She remains compliant with her medications including apixaban and diltiazem.  She took her first dose of duloxetine today, which will hopefully help with some of her anxiety that was exacerbated by her recent hospitalization.  [name] remains unsure about  whether to pursue dofetilide loading.  It is tentatively scheduled for early next month.   --------------------------------------------------------------------------------------------------  Past Medical History:  Diagnosis Date   Anxiety 08/23/2014   Chronic heart failure with preserved ejection fraction (HFpEF) ([place])    a. 02/2000 MUGA: EF 68%; b. 06/2005 Echo: EF 55-65%; c. 06/2017 Echo: EF 60-65%; d. 08/2020 Echo: EF 60-65%, no rwma, nl RV fxn. Mild BAE. Mild MR.   Current moderate episode of major depressive disorder without prior episode ([place]) 12/31/2017   Dependent edema 10/05/2011   DNR (do not resuscitate) 12/06/2022   GERD (gastroesophageal reflux disease) 12/22/1999   Formatting of this note might be different from the original. Controlled by OTC meds   History of breast cancer 10/05/2011   Hypertension 10/05/2011   Long term current use of anticoagulant therapy 08/29/2014   Mild persistent asthma without complication [id number]   Morbid obesity with BMI of 60.0-69.9, adult ([place]) 09/06/2018   Non-obstructive CAD (coronary artery disease)    a. 12/2017 Lexiscan PET/CT: mid anterior, apical lateral, and apical ischemia; b. 01/2018 Cath: LM nl, LAD & LCX 10-30% diff dzs throughout, RCA min irregs (<10%)-->Med rx.   Obstructive sleep apnea, adult 09/06/2018   Paroxysmal atrial fibrillation ([place]) 04/21/2019   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/2014   Type 2 diabetes mellitus without complication, without long-term current use of insulin ([place]) 04/21/2019   Urinary bladder incontinence 06/13/2013   Past Surgical History:  Procedure Laterality Date   CARDIAC CATHETERIZATION  02/16/2018   No stents;Dr. Collene Mares, [place] [facility]   COLONOSCOPY WITH PROPOFOL N/A 11/28/2021   Procedure: COLONOSCOPY WITH PROPOFOL;  Surgeon: Carol Ada, MD;  Location: [facility];  Service: Endoscopy;  Laterality: N/A;   FINGER SURGERY Right    MASTECTOMY Right     Current Meds   Medication Sig   albuterol (PROVENTIL HFA;VENTOLIN HFA) 108 (90 Base) MCG/ACT inhaler Inhale 2 puffs into the lungs every 6 (six) hours as needed for wheezing or shortness of breath.   apixaban (ELIQUIS) 5 MG TABS tablet Take 1 tablet (5 mg total) by mouth every 12 (twelve) hours.   Cholecalciferol 125 MCG (5000 UT) CHEW Chew 5,000 Units by mouth daily.   clonazePAM (KLONOPIN) 1 MG tablet Take 0.5 mg by mouth every 12 (twelve) hours.   diltiazem (CARDIZEM CD) 180 MG 24 hr capsule Take 1 capsule (180 mg total) by mouth daily.   DULoxetine (CYMBALTA) 30 MG capsule Take 30 mg by mouth daily.   fexofenadine (ALLEGRA) 180 MG tablet Take 180 mg by mouth daily.   furosemide (LASIX) 40 MG tablet Take 1 tablet (40 mg total) by mouth daily for 1 day.   levothyroxine (SYNTHROID) 50 MCG tablet Take 50 mcg by mouth daily before breakfast.   metFORMIN (GLUCOPHAGE) 500 MG tablet Take 1,000 mg by mouth 2 (two) times daily.   montelukast (SINGULAIR) 10 MG tablet Take 10 mg by mouth at bedtime.   Olopatadine HCl (PATADAY OP) Place 1 drop into both eyes daily.   omeprazole (PRILOSEC) 20 MG capsule Take 20 mg by mouth every 12 (twelve) hours.   [DISCONTINUED] lisinopril (ZESTRIL) 10 MG tablet Take 1 tablet (10 mg total) by mouth daily.    Allergies: Erythromycin  Social History   Tobacco Use   Smoking status: Former    Packs/day: 0.30    Years: 4.00    Total pack years: 1.20    Types: Cigarettes    Quit date: 12/22/1983    Years since quitting: 39.1   Smokeless tobacco: Never   Tobacco comments:    Former smoker 01/26/23  Vaping Use   Vaping Use: Never used  Substance Use Topics   Alcohol use: Yes    Comment: "not enough"   Drug use: No    Family History  Problem Relation Age of Onset   Heart Problems Mother    Hypertension Mother    Angina Mother    Atrial fibrillation Mother    Heart Problems Father    Hypertension Father    Kidney Stones Sister    Kidney cancer Neg Hx    Prostate  cancer Neg Hx    Bladder Cancer Neg Hx     Review of Systems: A 12-system review of systems was performed and was negative except as noted in the HPI.  --------------------------------------------------------------------------------------------------  Physical Exam: BP 128/68 (BP Location: Left Arm, Cuff Size: Large)   Pulse 83   Ht '5\' 5"'$  (1.651 m)   Wt (!) 352 lb 2 oz (159.7 kg)   SpO2 94%   BMI 58.60 kg/m  Repeat BP: 128/68  General:  NAD.  Accompanied by her husband. Neck: No obvious JVD or HJR, though body habitus limits evaluation.  Right IJ central venous catheter insertion site well-healed. Lungs: Clear to auscultation bilaterally without wheezes or crackles. Heart: Regular rate and rhythm without murmurs, rubs, or gallops. Abdomen: Soft, nontender, nondistended. Extremities: Trace pretibial edema bilaterally.  EKG: Normal sinus rhythm with possible left atrial enlargement.  No significant change from prior tracing on 01/26/2023.  Lab Results  Component Value Date   WBC 8.1 01/07/2023   HGB 10.5 (L) 01/07/2023   HCT 32.4 (L) 01/07/2023   MCV  89.3 01/07/2023   PLT 283 01/07/2023    Lab Results  Component Value Date   NA 136 02/04/2023   K 3.9 02/04/2023   CL 98 02/04/2023   CO2 28 02/04/2023   BUN 14 02/04/2023   CREATININE 0.61 02/04/2023   GLUCOSE 147 (H) 02/04/2023   ALT 29 12/06/2022    --------------------------------------------------------------------------------------------------  ASSESSMENT AND PLAN: Paroxysmal atrial fibrillation: [name] has not had any episodes of symptomatic atrial fibrillation since her recent hospitalization.  We discussed the history of her atrial fibrillation, initially diagnosed several years ago in [place].  She reports having had several paroxysms of atrial fibrillation that ultimately led to the initiation of flecainide prior to moving to [place].  However, during her recent hospitalization, flecainide was felt to be a  suboptimal medication for her in light of her HFpEF and marked hypotension/bradycardia following coronary CTA.  We have discussed continuing with a rate control strategy with her current regimen of diltiazem versus moving forward with initiation of dofetilide.  Ultimately, I will leave the decision to [name] and the atrial fibrillation team, though I think it would be beneficial for her to continue with a rhythm control strategy since she has had recurrent bouts of atrial fibrillation in the past necessitating flecainide use.  We will continue indefinite anticoagulation with apixaban in the setting of a CHA2DS2-VASc score of at least 5.  Chronic HFpEF: [name] appears fairly euvolemic today, though her body habitus makes assessment challenging.  She notes that her strength is improving.  We will defer medication changes today.  Precordial pain: No further chest pain noted since recent hospitalization.  Coronary CTA showed absence of coronary artery disease.  Continue primary prevention.  Morbid obesity: BMI 59.  I am happy to hear that [name]'s activity is increasing with the assistance of home PT/OT.  I encouraged her to keep working with this in an effort to improve her strength, balance, and ultimately weight and cardiovascular health.  60 minutes were spent on this encounter, including 40 minutes dedicated to speaking with [name] and her husband face-to-face about her recent hospitalization and answering questions about the cardiac CTA, hospital admission, and plans for dofetilide loading.  Follow-up: Return to clinic in 6 months.  Nelva Bush, MD 02/13/2023 12:29 PM

## 2023-02-12 NOTE — Patient Instructions (Signed)
Medication Instructions:  Your Physician recommend you continue on your current medication as directed.    *If you need a refill on your cardiac medications before your next appointment, please call your pharmacy*   Lab Work: None ordered today   Testing/Procedures: None ordered today   Follow-Up: At [facility], you and your health needs are our priority.  As part of our continuing mission to provide you with exceptional heart care, we have created designated Provider Care Teams.  These Care Teams include your primary Cardiologist (physician) and Advanced Practice Providers (APPs -  Physician Assistants and Nurse Practitioners) who all work together to provide you with the care you need, when you need it.  We recommend signing up for the patient portal called "MyChart".  Sign up information is provided on this After Visit Summary.  MyChart is used to connect with patients for Virtual Visits (Telemedicine).  Patients are able to view lab/test results, encounter notes, upcoming appointments, etc.  Non-urgent messages can be sent to your provider as well.   To learn more about what you can do with MyChart, go to [email or web address].    Your next appointment:   6 month(s)  Provider:   You may see Nelva Bush, MD or one of the following Advanced Practice Providers on your designated Care Team:   Murray Hodgkins, NP Christell Faith, PA-C Cadence Kathlen Mody, PA-C Gerrie Nordmann, NP

## 2023-02-13 ENCOUNTER — Encounter: Payer: Self-pay | Admitting: Internal Medicine

## 2023-02-15 DIAGNOSIS — F331 Major depressive disorder, recurrent, moderate: Secondary | ICD-10-CM | POA: Diagnosis not present

## 2023-02-15 DIAGNOSIS — Z7901 Long term (current) use of anticoagulants: Secondary | ICD-10-CM | POA: Diagnosis not present

## 2023-02-15 DIAGNOSIS — G4733 Obstructive sleep apnea (adult) (pediatric): Secondary | ICD-10-CM | POA: Diagnosis not present

## 2023-02-15 DIAGNOSIS — F419 Anxiety disorder, unspecified: Secondary | ICD-10-CM | POA: Diagnosis not present

## 2023-02-15 DIAGNOSIS — I4891 Unspecified atrial fibrillation: Secondary | ICD-10-CM | POA: Diagnosis not present

## 2023-02-15 DIAGNOSIS — E669 Obesity, unspecified: Secondary | ICD-10-CM | POA: Diagnosis not present

## 2023-02-15 DIAGNOSIS — I5032 Chronic diastolic (congestive) heart failure: Secondary | ICD-10-CM | POA: Diagnosis not present

## 2023-02-15 DIAGNOSIS — J449 Chronic obstructive pulmonary disease, unspecified: Secondary | ICD-10-CM | POA: Diagnosis not present

## 2023-02-15 DIAGNOSIS — I110 Hypertensive heart disease with heart failure: Secondary | ICD-10-CM | POA: Diagnosis not present

## 2023-02-15 DIAGNOSIS — Z6841 Body Mass Index (BMI) 40.0 and over, adult: Secondary | ICD-10-CM | POA: Diagnosis not present

## 2023-02-17 DIAGNOSIS — I110 Hypertensive heart disease with heart failure: Secondary | ICD-10-CM | POA: Diagnosis not present

## 2023-02-17 DIAGNOSIS — I5032 Chronic diastolic (congestive) heart failure: Secondary | ICD-10-CM | POA: Diagnosis not present

## 2023-02-17 DIAGNOSIS — Z7901 Long term (current) use of anticoagulants: Secondary | ICD-10-CM | POA: Diagnosis not present

## 2023-02-17 DIAGNOSIS — E669 Obesity, unspecified: Secondary | ICD-10-CM | POA: Diagnosis not present

## 2023-02-17 DIAGNOSIS — Z6841 Body Mass Index (BMI) 40.0 and over, adult: Secondary | ICD-10-CM | POA: Diagnosis not present

## 2023-02-17 DIAGNOSIS — F419 Anxiety disorder, unspecified: Secondary | ICD-10-CM | POA: Diagnosis not present

## 2023-02-17 DIAGNOSIS — J449 Chronic obstructive pulmonary disease, unspecified: Secondary | ICD-10-CM | POA: Diagnosis not present

## 2023-02-17 DIAGNOSIS — F331 Major depressive disorder, recurrent, moderate: Secondary | ICD-10-CM | POA: Diagnosis not present

## 2023-02-17 DIAGNOSIS — I4891 Unspecified atrial fibrillation: Secondary | ICD-10-CM | POA: Diagnosis not present

## 2023-02-17 DIAGNOSIS — G4733 Obstructive sleep apnea (adult) (pediatric): Secondary | ICD-10-CM | POA: Diagnosis not present

## 2023-02-18 DIAGNOSIS — E669 Obesity, unspecified: Secondary | ICD-10-CM | POA: Diagnosis not present

## 2023-02-18 DIAGNOSIS — G4733 Obstructive sleep apnea (adult) (pediatric): Secondary | ICD-10-CM | POA: Diagnosis not present

## 2023-02-18 DIAGNOSIS — I110 Hypertensive heart disease with heart failure: Secondary | ICD-10-CM | POA: Diagnosis not present

## 2023-02-18 DIAGNOSIS — Z7901 Long term (current) use of anticoagulants: Secondary | ICD-10-CM | POA: Diagnosis not present

## 2023-02-18 DIAGNOSIS — I5032 Chronic diastolic (congestive) heart failure: Secondary | ICD-10-CM | POA: Diagnosis not present

## 2023-02-18 DIAGNOSIS — J449 Chronic obstructive pulmonary disease, unspecified: Secondary | ICD-10-CM | POA: Diagnosis not present

## 2023-02-18 DIAGNOSIS — F419 Anxiety disorder, unspecified: Secondary | ICD-10-CM | POA: Diagnosis not present

## 2023-02-18 DIAGNOSIS — I4891 Unspecified atrial fibrillation: Secondary | ICD-10-CM | POA: Diagnosis not present

## 2023-02-18 DIAGNOSIS — F331 Major depressive disorder, recurrent, moderate: Secondary | ICD-10-CM | POA: Diagnosis not present

## 2023-02-18 DIAGNOSIS — Z6841 Body Mass Index (BMI) 40.0 and over, adult: Secondary | ICD-10-CM | POA: Diagnosis not present

## 2023-02-19 ENCOUNTER — Encounter (HOSPITAL_COMMUNITY): Payer: Self-pay

## 2023-02-19 DIAGNOSIS — G4733 Obstructive sleep apnea (adult) (pediatric): Secondary | ICD-10-CM | POA: Diagnosis not present

## 2023-02-19 DIAGNOSIS — I5032 Chronic diastolic (congestive) heart failure: Secondary | ICD-10-CM | POA: Diagnosis not present

## 2023-02-19 DIAGNOSIS — F419 Anxiety disorder, unspecified: Secondary | ICD-10-CM | POA: Diagnosis not present

## 2023-02-19 DIAGNOSIS — I110 Hypertensive heart disease with heart failure: Secondary | ICD-10-CM | POA: Diagnosis not present

## 2023-02-19 DIAGNOSIS — I4891 Unspecified atrial fibrillation: Secondary | ICD-10-CM | POA: Diagnosis not present

## 2023-02-19 DIAGNOSIS — Z6841 Body Mass Index (BMI) 40.0 and over, adult: Secondary | ICD-10-CM | POA: Diagnosis not present

## 2023-02-19 DIAGNOSIS — E669 Obesity, unspecified: Secondary | ICD-10-CM | POA: Diagnosis not present

## 2023-02-19 DIAGNOSIS — F331 Major depressive disorder, recurrent, moderate: Secondary | ICD-10-CM | POA: Diagnosis not present

## 2023-02-19 DIAGNOSIS — Z7901 Long term (current) use of anticoagulants: Secondary | ICD-10-CM | POA: Diagnosis not present

## 2023-02-19 DIAGNOSIS — J449 Chronic obstructive pulmonary disease, unspecified: Secondary | ICD-10-CM | POA: Diagnosis not present

## 2023-02-22 ENCOUNTER — Ambulatory Visit (HOSPITAL_COMMUNITY)
Admission: RE | Admit: 2023-02-22 | Discharge: 2023-02-22 | Disposition: A | Discharge status: Home / Self Care | Payer: 59 | Source: Ambulatory Visit | Attending: Physician Assistant | Admitting: Physician Assistant

## 2023-02-22 ENCOUNTER — Other Ambulatory Visit (HOSPITAL_COMMUNITY): Payer: Self-pay

## 2023-02-22 ENCOUNTER — Encounter (HOSPITAL_COMMUNITY): Payer: Self-pay | Admitting: Physician Assistant

## 2023-02-22 ENCOUNTER — Inpatient Hospital Stay (HOSPITAL_COMMUNITY)
Admission: RE | Admit: 2023-02-22 | Discharge: 2023-02-25 | DRG: 309 | Disposition: A | Discharge status: Home / Self Care | Payer: 59 | Source: Ambulatory Visit | Attending: Cardiology | Admitting: Cardiology

## 2023-02-22 ENCOUNTER — Other Ambulatory Visit: Payer: Self-pay

## 2023-02-22 VITALS — BP 142/70 | HR 94 | Ht 65.0 in | Wt 355.8 lb

## 2023-02-22 DIAGNOSIS — I110 Hypertensive heart disease with heart failure: Secondary | ICD-10-CM | POA: Diagnosis present

## 2023-02-22 DIAGNOSIS — K219 Gastro-esophageal reflux disease without esophagitis: Secondary | ICD-10-CM | POA: Diagnosis present

## 2023-02-22 DIAGNOSIS — D6869 Other thrombophilia: Secondary | ICD-10-CM

## 2023-02-22 DIAGNOSIS — E119 Type 2 diabetes mellitus without complications: Secondary | ICD-10-CM | POA: Diagnosis present

## 2023-02-22 DIAGNOSIS — Z7901 Long term (current) use of anticoagulants: Secondary | ICD-10-CM

## 2023-02-22 DIAGNOSIS — G4733 Obstructive sleep apnea (adult) (pediatric): Secondary | ICD-10-CM | POA: Diagnosis present

## 2023-02-22 DIAGNOSIS — Z9011 Acquired absence of right breast and nipple: Secondary | ICD-10-CM

## 2023-02-22 DIAGNOSIS — Z8249 Family history of ischemic heart disease and other diseases of the circulatory system: Secondary | ICD-10-CM

## 2023-02-22 DIAGNOSIS — I480 Paroxysmal atrial fibrillation: Secondary | ICD-10-CM | POA: Diagnosis not present

## 2023-02-22 DIAGNOSIS — Z853 Personal history of malignant neoplasm of breast: Secondary | ICD-10-CM | POA: Diagnosis not present

## 2023-02-22 DIAGNOSIS — Z6841 Body Mass Index (BMI) 40.0 and over, adult: Secondary | ICD-10-CM

## 2023-02-22 DIAGNOSIS — I5032 Chronic diastolic (congestive) heart failure: Secondary | ICD-10-CM | POA: Diagnosis not present

## 2023-02-22 DIAGNOSIS — I4819 Other persistent atrial fibrillation: Secondary | ICD-10-CM | POA: Diagnosis not present

## 2023-02-22 DIAGNOSIS — I2510 Atherosclerotic heart disease of native coronary artery without angina pectoris: Secondary | ICD-10-CM | POA: Diagnosis not present

## 2023-02-22 DIAGNOSIS — Z87891 Personal history of nicotine dependence: Secondary | ICD-10-CM

## 2023-02-22 DIAGNOSIS — Z79899 Other long term (current) drug therapy: Secondary | ICD-10-CM | POA: Diagnosis not present

## 2023-02-22 LAB — BASIC METABOLIC PANEL
Anion gap: 6 (ref 5–15)
BUN: 8 mg/dL (ref 8–23)
CO2: 33 mmol/L — ABNORMAL HIGH (ref 22–32)
Calcium: 8.8 mg/dL — ABNORMAL LOW (ref 8.9–10.3)
Chloride: 96 mmol/L — ABNORMAL LOW (ref 98–111)
Creatinine, Ser: 0.68 mg/dL (ref 0.44–1.00)
GFR, Estimated: >60 mL/min (ref >60)
Glucose, Bld: 130 mg/dL — ABNORMAL HIGH (ref 70–99)
Potassium: 4 mmol/L (ref 3.5–5.1)
Sodium: 135 mmol/L (ref 135–145)

## 2023-02-22 LAB — GLUCOSE, CAPILLARY: Glucose-Capillary: 117 mg/dL — ABNORMAL HIGH (ref 70–99)

## 2023-02-22 LAB — MAGNESIUM: Magnesium: 1.9 mg/dL (ref 1.7–2.4)

## 2023-02-22 MED ORDER — DULOXETINE HCL 30 MG PO CPEP: 30.0000 mg | ORAL_CAPSULE | Freq: Two times a day (BID) | ORAL | Status: DC

## 2023-02-22 MED ORDER — LEVOTHYROXINE SODIUM 50 MCG PO TABS
50.0000 ug | ORAL_TABLET | Freq: Every day | ORAL | Status: DC
  Administered 2023-02-23 – 2023-02-25 (×3): 50 ug via ORAL
  Filled 2023-02-22 (×3): qty 1

## 2023-02-22 MED ORDER — MONTELUKAST SODIUM 10 MG PO TABS
10.0000 mg | ORAL_TABLET | Freq: Every day | ORAL | Status: DC
  Administered 2023-02-22 – 2023-02-24 (×3): 10 mg via ORAL
  Filled 2023-02-22 (×3): qty 1

## 2023-02-22 MED ORDER — LISINOPRIL 10 MG PO TABS
10.0000 mg | ORAL_TABLET | Freq: Every day | ORAL | Status: DC
  Administered 2023-02-23 – 2023-02-25 (×3): 10 mg via ORAL
  Filled 2023-02-22 (×5): qty 1

## 2023-02-22 MED ORDER — MAGNESIUM SULFATE 2 GM/50ML IV SOLN
2.0000 g | Freq: Once | INTRAVENOUS | Status: AC
  Administered 2023-02-22: 2 g via INTRAVENOUS
  Filled 2023-02-22: qty 50

## 2023-02-22 MED ORDER — FUROSEMIDE 40 MG PO TABS
40.0000 mg | ORAL_TABLET | Freq: Every day | ORAL | Status: DC
  Administered 2023-02-22 – 2023-02-25 (×4): 40 mg via ORAL
  Filled 2023-02-22 (×5): qty 1

## 2023-02-22 MED ORDER — OLOPATADINE HCL 0.1 % OP SOLN
1.0000 [drp] | Freq: Every day | OPHTHALMIC | Status: DC
  Filled 2023-02-22: qty 5

## 2023-02-22 MED ORDER — LISINOPRIL 10 MG PO TABS: 10.0000 mg | ORAL_TABLET | Freq: Every day | ORAL | Status: DC

## 2023-02-22 MED ORDER — METFORMIN HCL 500 MG PO TABS
1000.0000 mg | ORAL_TABLET | Freq: Two times a day (BID) | ORAL | Status: DC
  Administered 2023-02-22 – 2023-02-25 (×6): 1000 mg via ORAL
  Filled 2023-02-22 (×6): qty 2

## 2023-02-22 MED ORDER — LORATADINE 10 MG PO TABS: 10.0000 mg | ORAL_TABLET | Freq: Every day | ORAL | Status: DC

## 2023-02-22 MED ORDER — DOFETILIDE 500 MCG PO CAPS
500.0000 ug | ORAL_CAPSULE | Freq: Two times a day (BID) | ORAL | Status: DC
  Administered 2023-02-22 – 2023-02-25 (×6): 500 ug via ORAL
  Filled 2023-02-22 (×6): qty 1

## 2023-02-22 MED ORDER — CLONAZEPAM 0.5 MG PO TABS
0.5000 mg | ORAL_TABLET | Freq: Two times a day (BID) | ORAL | Status: DC
  Administered 2023-02-22 – 2023-02-25 (×6): 0.5 mg via ORAL
  Filled 2023-02-22 (×7): qty 1

## 2023-02-22 MED ORDER — DILTIAZEM HCL ER COATED BEADS 180 MG PO CP24
180.0000 mg | ORAL_CAPSULE | Freq: Every day | ORAL | Status: DC
  Administered 2023-02-23 – 2023-02-25 (×3): 180 mg via ORAL
  Filled 2023-02-22 (×4): qty 1

## 2023-02-22 MED ORDER — SODIUM CHLORIDE 0.9% FLUSH
3.0000 mL | Freq: Two times a day (BID) | INTRAVENOUS | Status: DC
  Administered 2023-02-22 – 2023-02-25 (×7): 3 mL via INTRAVENOUS

## 2023-02-22 MED ORDER — APIXABAN 5 MG PO TABS: 5.0000 mg | ORAL_TABLET | Freq: Two times a day (BID) | ORAL | Status: DC

## 2023-02-22 MED ORDER — DULOXETINE HCL 30 MG PO CPEP
30.0000 mg | ORAL_CAPSULE | Freq: Two times a day (BID) | ORAL | Status: DC
  Administered 2023-02-23 – 2023-02-25 (×5): 30 mg via ORAL
  Filled 2023-02-22 (×7): qty 1

## 2023-02-22 MED ORDER — CLONAZEPAM 0.5 MG PO TABS: 0.5000 mg | ORAL_TABLET | Freq: Two times a day (BID) | ORAL | Status: DC

## 2023-02-22 MED ORDER — SODIUM CHLORIDE 0.9 % IV SOLN: 250.0000 mL | INTRAVENOUS | Status: DC | PRN

## 2023-02-22 MED ORDER — ALBUTEROL SULFATE (2.5 MG/3ML) 0.083% IN NEBU: 2.5000 mg | INHALATION_SOLUTION | Freq: Four times a day (QID) | RESPIRATORY_TRACT | Status: DC | PRN

## 2023-02-22 MED ORDER — PANTOPRAZOLE SODIUM 40 MG PO TBEC
40.0000 mg | DELAYED_RELEASE_TABLET | Freq: Two times a day (BID) | ORAL | Status: DC
  Administered 2023-02-22 – 2023-02-25 (×6): 40 mg via ORAL
  Filled 2023-02-22 (×6): qty 1

## 2023-02-22 MED ORDER — SODIUM CHLORIDE 0.9% FLUSH: 3.0000 mL | INTRAVENOUS | Status: DC | PRN

## 2023-02-22 MED ORDER — DILTIAZEM HCL ER COATED BEADS 180 MG PO CP24: 180.0000 mg | ORAL_CAPSULE | Freq: Every day | ORAL | Status: DC

## 2023-02-22 MED ORDER — APIXABAN 5 MG PO TABS
5.0000 mg | ORAL_TABLET | Freq: Two times a day (BID) | ORAL | Status: DC
  Administered 2023-02-22 – 2023-02-25 (×6): 5 mg via ORAL
  Filled 2023-02-22 (×7): qty 1

## 2023-02-22 MED ORDER — FUROSEMIDE 40 MG PO TABS: 40.0000 mg | ORAL_TABLET | Freq: Every day | ORAL | Status: DC

## 2023-02-22 MED ORDER — LORATADINE 10 MG PO TABS
10.0000 mg | ORAL_TABLET | Freq: Every day | ORAL | Status: DC
  Administered 2023-02-25: 10 mg via ORAL
  Filled 2023-02-22 (×4): qty 1

## 2023-02-22 NOTE — Progress Notes (Signed)
Pharmacy: Dofetilide (Tikosyn) - Initial Consult Assessment and Electrolyte Replacement  Pharmacy consulted to assist in monitoring and replacing electrolytes in this 62 y.o. female admitted on 02/22/2023 undergoing dofetilide initiation.  Assessment:  Patient Exclusion Criteria: If any screening criteria checked as "Yes", then  patient  should NOT receive dofetilide until criteria item is corrected.  If "Yes" please indicate correction plan.  YES  NO Patient  Exclusion Criteria Correction Plan   '[]'$   '[x]'$   Baseline QTc interval is greater than or equal to 440 msec. IF above YES box checked dofetilide contraindicated unless patient has ICD; then may proceed if QTc 500-550 msec or with known ventricular conduction abnormalities may proceed with QTc 550-600 msec. QTc =437    '[]'$   '[x]'$   Patient is known or suspected to have a digoxin level greater than 2 ng/ml: No results found for: "DIGOXIN"     '[]'$   '[x]'$   Creatinine clearance less than 20 ml/min (calculated using Cockcroft-Gault, actual body weight and serum creatinine): Estimated Creatinine Clearance: 115.2 mL/min (by C-G formula based on SCr of 0.68 mg/dL).     '[]'$   '[x]'$  Patient has received drugs known to prolong the QT intervals within the last 48 hours (phenothiazines, tricyclics or tetracyclic antidepressants, erythromycin, H-1 antihistamines, cisapride, fluoroquinolones, azithromycin, ondansetron).   Updated information on QT prolonging agents is available to be searched on the following database:QT prolonging agents     '[]'$   '[x]'$   Patient received a dose of hydrochlorothiazide (Oretic) alone or in any combination including triamterene (Dyazide, Maxzide) in the last 48 hours.    '[]'$   '[x]'$  Patient received a medication known to increase dofetilide plasma concentrations prior to initial dofetilide dose:  Trimethoprim (Primsol, Proloprim) in the last 36 hours Verapamil (Calan, Verelan) in the last 36 hours or a sustained release dose in  the last 72 hours Megestrol (Megace) in the last 5 days  Cimetidine (Tagamet) in the last 6 hours Ketoconazole (Nizoral) in the last 24 hours Itraconazole (Sporanox) in the last 48 hours  Prochlorperazine (Compazine) in the last 36 hours     '[]'$   '[x]'$   Patient is known to have a history of torsades de pointes; congenital or acquired long QT syndromes.    '[]'$   '[x]'$   Patient has received a Class 1 antiarrhythmic with less than 2 half-lives since last dose. (Disopyramide, Quinidine, Procainamide, Lidocaine, Mexiletine, Flecainide, Propafenone)    '[]'$   '[x]'$   Patient has received amiodarone therapy in the past 3 months or amiodarone level is greater than 0.3 ng/ml.    Labs:    Component Value Date/Time   K 4.0 02/22/2023 1002   MG 1.9 02/22/2023 1002     Plan: Select One Calculated CrCl  Dose q12h  '[x]'$  > 60 ml/min 500 mcg  '[]'$  40-60 ml/min 250 mcg  '[]'$  20-40 ml/min 125 mcg   '[x]'$   Physician selected initial dose within range recommended for patients level of renal function - will monitor for response.  '[]'$   Physician selected initial dose outside of range recommended for patients level of renal function - will discuss if the dose should be altered at this time.   Patient has been appropriately anticoagulated with apixaban.  Potassium: K >/= 4: Appropriate to initiate Tikosyn, no replacement needed    Magnesium: Mg 1.8-2: Give Mg 2 gm IV x1 to prevent Mg from dropping below 1.8 - do not need to recheck Mg. Appropriate to initiate Tikosyn   Thank you for allowing pharmacy to participate in this patient's  care   Hildred Laser, PharmD Clinical Pharmacist **Pharmacist phone directory can now be found on [email or web address] ([email or web address]).  Listed under [place].

## 2023-02-22 NOTE — Care Management (Signed)
  Transition of Care [facility]) Screening Note   Patient Details  Name: [name] Date of Birth: 14-Feb-1961   Transition of Care [facility]) CM/SW Contact:    Bethena Roys, RN Phone Number: 02/22/2023, 11:38 AM    Transition of Care Department [facility]) has reviewed the patient. Patient presented for Tikosyn Load. Benefits check submitted for cost. Case Manager will discuss cost and pharmacy of choice as the patient progresses.

## 2023-02-22 NOTE — Progress Notes (Addendum)
Post first dose Tikosyn QTC 432.  Will continue to monitor closely.

## 2023-02-22 NOTE — Progress Notes (Signed)
Primary Care Physician: Hayden Rasmussen, MD Primary Cardiologist: Dr End Primary Electrophysiologist: Dr Quentin Ore Referring Physician: Dr Martyn Malay is a 62 y.o. female with a history of HFpEF, HTN, CAD, DM, OSA, atrial fibrillation who presents for follow up in the [facility].  The patient was initially diagnosed with atrial fibrillation in 2015 and has been maintained on verapamil and flecainide for many years. She presented for coronary CTA. Prior to the CTA she took 100 mg of Lopressor as instructed. At the coronary CTA procedure her heart rate was greater than 60 bpm so she was given an additional 10 mg of IV Lopressor. This led to bradycardia and hypotension. IV fluids were administered and she was transported to the ER. There she was found to be in a junctional rhythm in the 30s. She was admitted to the ICU where she required epinephrine and then norepinephrine for hemodynamic support. Her flecainide was discontinued, not felt to be a good candidate for multiple instances of decompensated CHF. Patient is on Eliquis for a CHADS2VASC score of 5.   On follow up today, patient presents for dofetilide admission. She remains in SR today and is feeling well. She has transitioned from lamotrigine to duloxetine. No missed doses of anticoagulation.   Today, she denies symptoms of palpitations, chest pain, shortness of breath, orthopnea, PND, lower extremity edema, dizziness, presyncope, syncope, bleeding, or neurologic sequela. The patient is tolerating medications without difficulties and is otherwise without complaint today.    Atrial Fibrillation Risk Factors:  she does have symptoms or diagnosis of sleep apnea. she is compliant with CPAP therapy. she does not have a history of rheumatic fever.   she has a BMI of Body mass index is 59.21 kg/m.Marland Kitchen Filed Weights   02/22/23 0936  Weight: (!) 161.4 kg    Family History  Problem Relation Age of Onset    Heart Problems Mother    Hypertension Mother    Angina Mother    Atrial fibrillation Mother    Heart Problems Father    Hypertension Father    Kidney Stones Sister    Kidney cancer Neg Hx    Prostate cancer Neg Hx    Bladder Cancer Neg Hx      Atrial Fibrillation Management history:  Previous antiarrhythmic drugs: flecainide  Previous cardioversions: none Previous ablations: none CHADS2VASC score: 5 Anticoagulation history: Eliquis   Past Medical History:  Diagnosis Date   Anxiety 08/23/2014   Chronic heart failure with preserved ejection fraction (HFpEF) ([place])    a. 02/2000 MUGA: EF 68%; b. 06/2005 Echo: EF 55-65%; c. 06/2017 Echo: EF 60-65%; d. 08/2020 Echo: EF 60-65%, no rwma, nl RV fxn. Mild BAE. Mild MR.   Current moderate episode of major depressive disorder without prior episode ([place]) 12/31/2017   Dependent edema 10/05/2011   DNR (do not resuscitate) 12/06/2022   GERD (gastroesophageal reflux disease) 12/22/1999   Formatting of this note might be different from the original. Controlled by OTC meds   History of breast cancer 10/05/2011   Hypertension 10/05/2011   Long term current use of anticoagulant therapy 08/29/2014   Mild persistent asthma without complication [id number]   Morbid obesity with BMI of 60.0-69.9, adult ([place]) 09/06/2018   Non-obstructive CAD (coronary artery disease)    a. 12/2017 Lexiscan PET/CT: mid anterior, apical lateral, and apical ischemia; b. 01/2018 Cath: LM nl, LAD & LCX 10-30% diff dzs throughout, RCA min irregs (<10%)-->Med rx.   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 Collene [place]ares, [place]  [facility]   COLONOSCOPY WITH PROPOFOL N/A 11/28/2021   Procedure: COLONOSCOPY WITH PROPOFOL;  Surgeon: Carol Ada, MD;  Location: [facility];  Service: Endoscopy;  Laterality: N/A;   FINGER SURGERY Right    MASTECTOMY Right     Current Outpatient Medications  Medication Sig Dispense Refill   albuterol (PROVENTIL HFA;VENTOLIN HFA) 108 (90 Base) MCG/ACT inhaler Inhale 2 puffs into the lungs every 6 (six) hours as needed for wheezing or shortness of breath. 1 Inhaler 2   apixaban (ELIQUIS) 5 MG TABS tablet Take 1 tablet (5 mg total) by mouth every 12 (twelve) hours. 60 tablet 11   Cholecalciferol 125 MCG (5000 UT) CHEW Chew 5,000 Units by mouth daily.     clonazePAM (KLONOPIN) 1 MG tablet Take 0.5 mg by mouth every 12 (twelve) hours.     diltiazem (CARDIZEM CD) 180 MG 24 hr capsule Take 1 capsule (180 mg total) by mouth daily. 30 capsule 3   DULoxetine (CYMBALTA) 30 MG capsule Take 30 mg by mouth 2 (two) times daily.     fexofenadine (ALLEGRA) 180 MG tablet Take 180 mg by mouth daily.     furosemide (LASIX) 40 MG tablet Take 1 tablet (40 mg total) by mouth daily for 1 day. 30 tablet    levothyroxine (SYNTHROID) 50 MCG tablet Take 50 mcg by mouth daily before breakfast.     lisinopril (ZESTRIL) 10 MG tablet Take 1 tablet (10 mg total) by mouth daily. 90 tablet 3   metFORMIN (GLUCOPHAGE) 500 MG tablet Take 1,000 mg by mouth 2 (two) times daily.     montelukast (SINGULAIR) 10 MG tablet Take 10 mg by mouth at bedtime.     Olopatadine HCl (PATADAY OP) Place 1 drop into both eyes daily.     omeprazole (PRILOSEC) 20 MG capsule Take 20 mg by mouth every 12 (twelve) hours.     No current facility-administered medications for this encounter.    Allergies  Allergen Reactions   Erythromycin Diarrhea    Social History   Socioeconomic History   Marital status: Married    Spouse name: Not on file   Number of children: Not on file   Years of education: Not on file   Highest  education level: Not on file  Occupational History   Not on file  Tobacco Use   Smoking status: Former    Packs/day: 0.30    Years: 4.00    Total pack years: 1.20    Types: Cigarettes    Quit date: 12/22/1983    Years since quitting: 39.1   Smokeless tobacco: Never   Tobacco comments:    Former smoker 01/26/23  Vaping Use   Vaping Use: Never used  Substance and Sexual Activity   Alcohol use: Yes    Comment: "not enough"   Drug use: No   Sexual activity: Not on file  Other Topics Concern   Not on file  Social History Narrative   Not on file   Social Determinants of Health   Financial Resource Strain: Not on file  Food Insecurity: No Food Insecurity (01/04/2023)   Hunger Vital Sign  Worried About Charity fundraiser in the Last Year: Never true    [place] in the Last Year: Never true  Transportation Needs: No Transportation Needs (01/11/2023)   PRAPARE - Hydrologist (Medical): No    Lack of Transportation (Non-Medical): No  Physical Activity: Not on file  Stress: Not on file  Social Connections: Not on file  Intimate Partner Violence: Not At Risk (01/04/2023)   Humiliation, Afraid, Rape, and Kick questionnaire    Fear of Current or Ex-Partner: No    Emotionally Abused: No    Physically Abused: No    Sexually Abused: No     ROS- All systems are reviewed and negative except as per the HPI above.  Physical Exam: Vitals:   02/22/23 0936  BP: (!) 142/70  Pulse: 94  Weight: (!) 161.4 kg  Height: '5\' 5"'$  (1.651 m)     GEN- The patient is a well appearing obese female, alert and oriented x 3 today.   HEENT-head normocephalic, atraumatic, sclera clear, conjunctiva pink, hearing intact, trachea midline. Lungs- Clear to ausculation bilaterally, normal work of breathing Heart- Regular rate and rhythm, no murmurs, rubs or gallops  GI- soft, NT, ND, + BS Extremities- no clubbing, cyanosis, or edema MS- no significant deformity or  atrophy Skin- no rash or lesion Psych- euthymic mood, full affect Neuro- strength and sensation are intact   Wt Readings from Last 3 Encounters:  02/22/23 (!) 161.4 kg  02/12/23 (!) 159.7 kg  01/26/23 (!) 164.7 kg    EKG today demonstrates  SR Vent. rate 94 BPM PR interval 142 ms QRS duration 82 ms QT/QTcB 350/437 ms  Echo 01/05/23 demonstrated   1. Left ventricular ejection fraction, by estimation, is 60 to 65%. The  left ventricle has normal function. Left ventricular endocardial border  not optimally defined to evaluate regional wall motion. The left  ventricular internal cavity size was mildly dilated. There is mild left ventricular hypertrophy. Left ventricular diastolic parameters were normal.   2. Right ventricular systolic function is normal. The right ventricular  size is mildly enlarged. There is moderately elevated pulmonary artery  systolic pressure.   3. Right atrial size was mildly dilated.   4. The mitral valve is grossly normal. Trivial mitral valve  regurgitation.   5. The aortic valve was not well visualized. Aortic valve regurgitation  is not visualized. No aortic stenosis is present.   6. The inferior vena cava is dilated in size with <50% respiratory  variability, suggesting right atrial pressure of 15 mmHg.   Epic records are reviewed at length today  CHA2DS2-VASc Score = 5  The patient's score is based upon: CHF History: 1 HTN History: 1 Diabetes History: 1 Stroke History: 0 Vascular Disease History: 1 Age Score: 0 Gender Score: 1       ASSESSMENT AND PLAN: 1. Paroxysmal Atrial Fibrillation (ICD10:  I48.0) The patient's CHA2DS2-VASc score is 5, indicating a 7.2% annual risk of stroke.   Patient now off flecainide, not felt to be a good candidate with frequent CHF. Patient presents for dofetilide admission. Continue Eliquis 5 mg BID, states no missed doses in the last 3 weeks. No recent benadryl use PharmD has screened medications, patient  off lamotrigine, now on Cymbalta.  QTc in SR 437 ms Labs today show creatinine at 0.68, K+ 4.0 and mag 1.9, CrCl calculated at 221 mL/min Continue diltiazem 180 mg daily  2. Secondary Hypercoagulable State (ICD10:  D68.69) The  patient is at significant risk for stroke/thromboembolism based upon her CHA2DS2-VASc Score of 5.  Continue Apixaban (Eliquis).   3. Obesity Body mass index is 59.21 kg/m. Lifestyle modification was discussed and encouraged including regular physical activity and weight reduction.  4. Obstructive sleep apnea Encouraged compliance with CPAP therapy.  5. Chronic HFpEF Appears euvolemic today.  6. HTN Stable, no changes today.   To be admitted later today once a bed becomes available.    [facility] [address] [place], [place] [address] [phone] 02/22/2023 9:54 AM

## 2023-02-22 NOTE — H&P (Signed)
Primary Care Physician: Hayden Rasmussen, MD Primary Cardiologist: Dr End Primary Electrophysiologist: Dr Quentin Ore Referring Physician: Dr Martyn Malay is a 62 y.o. female with a history of HFpEF, HTN, CAD, DM, OSA, atrial fibrillation who presents for follow up in the [facility].  The patient was initially diagnosed with atrial fibrillation in 2015 and has been maintained on verapamil and flecainide for many years. She presented for coronary CTA. Prior to the CTA she took 100 mg of Lopressor as instructed. At the coronary CTA procedure her heart rate was greater than 60 bpm so she was given an additional 10 mg of IV Lopressor. This led to bradycardia and hypotension. IV fluids were administered and she was transported to the ER. There she was found to be in a junctional rhythm in the 30s. She was admitted to the ICU where she required epinephrine and then norepinephrine for hemodynamic support. Her flecainide was discontinued, not felt to be a good candidate for multiple instances of decompensated CHF. Patient is on Eliquis for a CHADS2VASC score of 5.   On follow up today, patient presents for dofetilide admission. She remains in SR today and is feeling well. She has transitioned from lamotrigine to duloxetine. No missed doses of anticoagulation.   Today, she denies symptoms of palpitations, chest pain, shortness of breath, orthopnea, PND, lower extremity edema, dizziness, presyncope, syncope, bleeding, or neurologic sequela. The patient is tolerating medications without difficulties and is otherwise without complaint today.    Atrial Fibrillation Risk Factors:  she does have symptoms or diagnosis of sleep apnea. she is compliant with CPAP therapy. she does not have a history of rheumatic fever.   she has a BMI of There is no height or weight on file to calculate BMI.. There were no vitals filed for this visit.   Family History  Problem Relation  Age of Onset   Heart Problems Mother    Hypertension Mother    Angina Mother    Atrial fibrillation Mother    Heart Problems Father    Hypertension Father    Kidney Stones Sister    Kidney cancer Neg Hx    Prostate cancer Neg Hx    Bladder Cancer Neg Hx      Atrial Fibrillation Management history:  Previous antiarrhythmic drugs: flecainide  Previous cardioversions: none Previous ablations: none CHADS2VASC score: 5 Anticoagulation history: Eliquis   Past Medical History:  Diagnosis Date   Anxiety 08/23/2014   Chronic heart failure with preserved ejection fraction (HFpEF) ([place])    a. 02/2000 MUGA: EF 68%; b. 06/2005 Echo: EF 55-65%; c. 06/2017 Echo: EF 60-65%; d. 08/2020 Echo: EF 60-65%, no rwma, nl RV fxn. Mild BAE. Mild MR.   Current moderate episode of major depressive disorder without prior episode ([place]) 12/31/2017   Dependent edema 10/05/2011   DNR (do not resuscitate) 12/06/2022   GERD (gastroesophageal reflux disease) 12/22/1999   Formatting of this note might be different from the original. Controlled by OTC meds   History of breast cancer 10/05/2011   Hypertension 10/05/2011   Long term current use of anticoagulant therapy 08/29/2014   Mild persistent asthma without complication [id number]   Morbid obesity with BMI of 60.0-69.9, adult ([place]) 09/06/2018   Non-obstructive CAD (coronary artery disease)    a. 12/2017 Lexiscan PET/CT: mid anterior, apical lateral, and apical ischemia; b. 01/2018 Cath: LM nl, LAD & LCX 10-30% diff dzs throughout, RCA min irregs (<10%)-->Med rx.   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 Collene Mares, [place] [facility]   COLONOSCOPY WITH PROPOFOL N/A 11/28/2021   Procedure: COLONOSCOPY WITH PROPOFOL;  Surgeon: Carol Ada, MD;  Location: [facility] END[facility]COPY;  Service: Endoscopy;  Laterality: N/A;   FINGER SURGERY Right    MASTECTOMY Right     Current Facility-Administered Medications  Medication Dose Route Frequency Provider Last Rate Last Admin   0.9 %  sodium chloride infusion  250 mL Intravenous PRN Fenton, Clint R, PA       albuterol (PROVENTIL) (2.5 MG/3ML) 0.083% nebulizer solution 2.5 mg  2.5 mg Inhalation Q6H PRN Fenton, Clint R, PA       apixaban (ELIQUIS) tablet 5 mg  5 mg Oral BID Vickie Epley, MD       clonazePAM Bobbye [place]) tablet 0.5 mg  0.5 mg Oral Q12H Vickie Epley, MD       diltiazem (CARDIZEM CD) 24 hr capsule 180 mg  180 mg Oral Daily Vickie Epley, MD       dofetilide (TIKOSYN) capsule 500 mcg  500 mcg Oral BID Fenton, Clint R, PA       DULoxetine (CYMBALTA) DR capsule 30 mg  30 mg Oral BID Vickie Epley, MD       furosemide (LASIX) tablet 40 mg  40 mg Oral Daily Vickie Epley, MD       [START ON 02/23/2023] levothyroxine (SYNTHROID) tablet 50 mcg  50 mcg Oral QAC breakfast Fenton, Clint R, PA       lisinopril (ZESTRIL) tablet 10 mg  10 mg Oral Daily Vickie Epley, MD       loratadine (CLARITIN) tablet 10 mg  10 mg Oral Daily Vickie Epley, MD       metFORMIN (GLUCOPHAGE) tablet 1,000 mg  1,000 mg Oral BID WC Fenton, Clint R, PA       montelukast (SINGULAIR) tablet 10 mg  10 mg Oral QHS Fenton, Clint R, PA       olopatadine (PATANOL) 0.1 % ophthalmic solution 1 drop  1 drop Both Eyes Daily Vickie Epley, MD       pantoprazole (PROTONIX) EC tablet 40 mg  40 mg Oral BID Fenton, Clint R, PA       sodium chloride flush (NS) 0.9 % injection 3 mL  3 mL Intravenous Q12H Fenton, Clint R, PA   3 mL at 02/22/23 1216   sodium chloride flush (NS) 0.9 % injection 3 mL  3 mL Intravenous PRN Fenton, Clint R, PA        Allergies   Allergen Reactions   Erythromycin Diarrhea    Social History   Socioeconomic History   Marital status: Married    Spouse name: Not on file   Number of children: Not on file   Years of education: Not on file   Highest education level: Not on file  Occupational History   Not on file  Tobacco Use   Smoking status: Former    Packs/day: 0.30    Years: 4.00    Total pack years: 1.20    Types: Cigarettes    Quit date: 12/22/1983    Years since quitting:  39.1   Smokeless tobacco: Never   Tobacco comments:    Former smoker 01/26/23  Vaping Use   Vaping Use: Never used  Substance and Sexual Activity   Alcohol use: Yes    Comment: "not enough"   Drug use: No   Sexual activity: Not on file  Other Topics Concern   Not on file  Social History Narrative   Not on file   Social Determinants of Health   Financial Resource Strain: Not on file  Food Insecurity: No Food Insecurity (01/04/2023)   Hunger Vital Sign    Worried About Running Out of Food in the Last Year: Never true    Ran Out of Food in the Last Year: Never true  Transportation Needs: No Transportation Needs (01/11/2023)   PRAPARE - Hydrologist (Medical): No    Lack of Transportation (Non-Medical): No  Physical Activity: Not on file  Stress: Not on file  Social Connections: Not on file  Intimate Partner Violence: Not At Risk (01/04/2023)   Humiliation, Afraid, Rape, and Kick questionnaire    Fear of Current or Ex-Partner: No    Emotionally Abused: No    Physically Abused: No    Sexually Abused: No     ROS- All systems are reviewed and negative except as per the HPI above.  Physical Exam: Vitals:   02/22/23 1027  BP: 114/63  Pulse: 79  Resp: 16  Temp: 99.3 F (37.4 C)  TempSrc: Oral  SpO2: 98%     GEN- The patient is a well appearing obese female, alert and oriented x 3 today.   HEENT-head normocephalic, atraumatic, sclera clear, conjunctiva pink, hearing intact, trachea  midline. Lungs- Clear to ausculation bilaterally, normal work of breathing Heart- Regular rate and rhythm, no murmurs, rubs or gallops  GI- soft, NT, ND, + BS Extremities- no clubbing, cyanosis, or edema MS- no significant deformity or atrophy Skin- no rash or lesion Psych- euthymic mood, full affect Neuro- strength and sensation are intact   Wt Readings from Last 3 Encounters:  02/22/23 (!) 161.4 kg  02/12/23 (!) 159.7 kg  01/26/23 (!) 164.7 kg    EKG today demonstrates  SR Vent. rate 94 BPM PR interval 142 ms QRS duration 82 ms QT/QTcB 350/437 ms  Echo 01/05/23 demonstrated   1. Left ventricular ejection fraction, by estimation, is 60 to 65%. The  left ventricle has normal function. Left ventricular endocardial border  not optimally defined to evaluate regional wall motion. The left  ventricular internal cavity size was mildly dilated. There is mild left ventricular hypertrophy. Left ventricular diastolic parameters were normal.   2. Right ventricular systolic function is normal. The right ventricular  size is mildly enlarged. There is moderately elevated pulmonary artery  systolic pressure.   3. Right atrial size was mildly dilated.   4. The mitral valve is grossly normal. Trivial mitral valve  regurgitation.   5. The aortic valve was not well visualized. Aortic valve regurgitation  is not visualized. No aortic stenosis is present.   6. The inferior vena cava is dilated in size with <50% respiratory  variability, suggesting right atrial pressure of 15 mmHg.   Epic records are reviewed at length today  CHA2DS2-VASc Score = 5  The patient's score is based upon: CHF History: 1 HTN History: 1 Diabetes History: 1 Stroke History: 0 Vascular Disease History: 1 Age Score: 0 Gender Score: 1       ASSESSMENT AND PLAN: 1. Paroxysmal Atrial  Fibrillation (ICD10:  I48.0) The patient's CHA2DS2-VASc score is 5, indicating a 7.2% annual risk of stroke.   Patient now off  flecainide, not felt to be a good candidate with frequent CHF. Patient presents for dofetilide admission. Continue Eliquis 5 mg BID, states no missed doses in the last 3 weeks. No recent benadryl use PharmD has screened medications, patient off lamotrigine, now on Cymbalta.  QTc in SR 437 ms Labs today show creatinine at 0.68, K+ 4.0 and mag 1.9, CrCl calculated at 221 mL/min Continue diltiazem 180 mg daily  2. Secondary Hypercoagulable State (ICD10:  D68.69) The patient is at significant risk for stroke/thromboembolism based upon her CHA2DS2-VASc Score of 5.  Continue Apixaban (Eliquis).   3. Obesity There is no height or weight on file to calculate BMI. Lifestyle modification was discussed and encouraged including regular physical activity and weight reduction.  4. Obstructive sleep apnea Encouraged compliance with CPAP therapy.  5. Chronic HFpEF Appears euvolemic today.  6. HTN Stable, no changes today.

## 2023-02-22 NOTE — TOC Benefit Eligibility Note (Signed)
Patient Teacher, English as a foreign language completed.    The patient is currently admitted and upon discharge could be taking dofetilide (Tikosyn) 500 mcg capsules.  The current 30 day co-pay is $0.00.   The patient is insured through [place] [place] Patient Advocate Specialist [place] Patient Advocate Team Direct Number: [phone]  Fax: [phone]

## 2023-02-23 ENCOUNTER — Encounter (HOSPITAL_COMMUNITY): Payer: Self-pay | Admitting: Cardiology

## 2023-02-23 DIAGNOSIS — I480 Paroxysmal atrial fibrillation: Secondary | ICD-10-CM | POA: Diagnosis not present

## 2023-02-23 LAB — BASIC METABOLIC PANEL
Anion gap: 10 (ref 5–15)
BUN: 9 mg/dL (ref 8–23)
CO2: 29 mmol/L (ref 22–32)
Calcium: 8.5 mg/dL — ABNORMAL LOW (ref 8.9–10.3)
Chloride: 97 mmol/L — ABNORMAL LOW (ref 98–111)
Creatinine, Ser: 0.66 mg/dL (ref 0.44–1.00)
GFR, Estimated: >60 mL/min (ref >60)
Glucose, Bld: 108 mg/dL — ABNORMAL HIGH (ref 70–99)
Potassium: 3.6 mmol/L (ref 3.5–5.1)
Sodium: 136 mmol/L (ref 135–145)

## 2023-02-23 LAB — GLUCOSE, CAPILLARY: Glucose-Capillary: 129 mg/dL — ABNORMAL HIGH (ref 70–99)

## 2023-02-23 LAB — MAGNESIUM: Magnesium: 1.9 mg/dL (ref 1.7–2.4)

## 2023-02-23 MED ORDER — MAGNESIUM SULFATE 2 GM/50ML IV SOLN
2.0000 g | Freq: Once | INTRAVENOUS | Status: AC
  Administered 2023-02-23: 2 g via INTRAVENOUS
  Filled 2023-02-23: qty 50

## 2023-02-23 MED ORDER — POTASSIUM CHLORIDE CRYS ER 20 MEQ PO TBCR
40.0000 meq | EXTENDED_RELEASE_TABLET | Freq: Once | ORAL | Status: AC
  Administered 2023-02-23: 40 meq via ORAL
  Filled 2023-02-23: qty 2

## 2023-02-23 NOTE — Progress Notes (Signed)
Morning EKG reviewed     Shows has converted to NSR at 72 bpm with stable QT at 479m, QTc at 449 ms.  Continue  Tikosyn 500 mcg BID.   Potassium3.6 (03/05 [id number] Magnesium  1.9 (03/05 0218) Creatinine, ser  0.66 (03/05 0218)  Plan for home Thursday if QTc remains stable   SMamie Levers NP  02/23/2023 11:46 AM

## 2023-02-23 NOTE — Progress Notes (Signed)
Cards PA notified via AMION of K 3.6 and Mg 1.9 this am as well as overnight aberrant beats.

## 2023-02-23 NOTE — Progress Notes (Signed)
Electrophysiology Rounding Note  Patient Name: [name] Date of Encounter: 02/23/2023  Primary Cardiologist: Nelva Bush, MD  Electrophysiologist: Vickie Epley, MD    Subjective   Pt remains in NSR on Tikosyn 500 mcg BID   QTc from EKG last pm shows stable QT at 434m, stable QTc at 4311m The patient is doing well today.  At this time, the patient denies chest pain, shortness of breath, or any new concerns.  Inpatient Medications    Scheduled Meds:  apixaban  5 mg Oral BID   clonazePAM  0.5 mg Oral Q12H   diltiazem  180 mg Oral Daily   dofetilide  500 mcg Oral BID   DULoxetine  30 mg Oral BID   furosemide  40 mg Oral Daily   levothyroxine  50 mcg Oral QAC breakfast   lisinopril  10 mg Oral Daily   loratadine  10 mg Oral Daily   metFORMIN  1,000 mg Oral BID WC   montelukast  10 mg Oral QHS   olopatadine  1 drop Both Eyes Daily   pantoprazole  40 mg Oral BID   sodium chloride flush  3 mL Intravenous Q12H   Continuous Infusions:  sodium chloride     PRN Meds: sodium chloride, albuterol, sodium chloride flush   Vital Signs    Vitals:   02/22/23 1604 02/22/23 1958 02/23/23 0630 02/23/23 0740  BP: 118/64 99/60 132/69 126/73  Pulse: 72 74 76 74  Resp: '16 17 16 18  '$ Temp: 98.6 F (37 C) 98.8 F (37.1 C) 98.3 F (36.8 C) 98.2 F (36.8 C)  TempSrc: Oral Oral Oral Oral  SpO2: 98% 95% 100% 99%    Intake/Output Summary (Last 24 hours) at 02/23/2023 1055 Last data filed at 02/23/2023 1049 Gross per 24 hour  Intake 770 ml  Output --  Net 770 ml   There were no vitals filed for this visit.  Physical Exam    GEN- NAD, A&O x 3. Normal affect.  Lungs- CTAB, Normal effort.  Heart- Regular rate and rhythm. No M/G/R GI- Soft, NT, ND Extremities- No clubbing, cyanosis, or edema Skin- no rash or lesion  Labs    CBC No results for input(s): "WBC", "NEUTROABS", "HGB", "HCT", "MCV", "PLT" in the last 72 hours. Basic Metabolic Panel Recent Labs     02/22/23 1002 02/23/23 0218  NA 135 136  K 4.0 3.6  CL 96* 97*  CO2 33* 29  GLUCOSE 130* 108*  BUN 8 9  CREATININE 0.68 0.66  CALCIUM 8.8* 8.5*  MG 1.9 1.9    Telemetry    SR 1 episode of 18 beat wide complex, monomorphic rhythm, VT vs VS (personally reviewed)  Patient Profile     Nichole MORANOs a [age].o. female with a past medical history significant for persistent atrial fibrillation.  They were admitted for tikosyn load.   Assessment & Plan    Persistent atrial fibrillation Pt remains in NSR on Tikosyn 500 mcg BID  Continue Eliquis Creatinine, ser  0.66 (03/05 0218) Magnesium  1.9 (03/05 0218) Potassium3.6 (03/05 0218) Supplement both K and Mg per protocol  Plan for home Thursday if QTc remains stable.  #) NSVT vs VS Discussed with Dr. LaArley Phenix as above Will cont to monitor  #) HFpEF Last echo showed EF 60-65% H/o fluid overload requiring diuresis and admissions Appears euvolemic on exam  #) HTN Normotensive on current regimen  #) OSA Encouraged CPAP usage, patient uses  nightly  #) CAD No ischemic symptoms   For questions or updates, please contact [place] Please consult [email or web address] for contact info under Cardiology/STEMI.  Signed, Mamie Levers, NP  02/23/2023, 10:55 AM

## 2023-02-23 NOTE — Progress Notes (Signed)
Notified by central telemetry, pt had 18 beat run of aberrant beats.  Pt lying on left side with cpap.  Will continue to monitor closely.

## 2023-02-23 NOTE — Progress Notes (Signed)
Pharmacy: Dofetilide (Tikosyn) - Follow Up Assessment and Electrolyte Replacement  Pharmacy consulted to assist in monitoring and replacing electrolytes in this 62 y.o. female admitted on 02/22/2023 undergoing dofetilide initiation.   Labs:    Component Value Date/Time   K 3.6 02/23/2023 0218   MG 1.9 02/23/2023 0218     Plan: Potassium: -K 77mq po already given  Magnesium: -Mg 2gm IV already given   Thank you for allowing pharmacy to participate in this patient's care    AHildred Laser PharmD Clinical Pharmacist **Pharmacist phone directory can now be found on [email or web address] (PW [email or web address]).  Listed under [place]

## 2023-02-24 ENCOUNTER — Other Ambulatory Visit (HOSPITAL_COMMUNITY): Payer: Self-pay

## 2023-02-24 DIAGNOSIS — I480 Paroxysmal atrial fibrillation: Secondary | ICD-10-CM | POA: Diagnosis not present

## 2023-02-24 LAB — BASIC METABOLIC PANEL
Anion gap: 9 (ref 5–15)
BUN: 8 mg/dL (ref 8–23)
CO2: 29 mmol/L (ref 22–32)
Calcium: 8.7 mg/dL — ABNORMAL LOW (ref 8.9–10.3)
Chloride: 100 mmol/L (ref 98–111)
Creatinine, Ser: 0.65 mg/dL (ref 0.44–1.00)
GFR, Estimated: >60 mL/min (ref >60)
Glucose, Bld: 105 mg/dL — ABNORMAL HIGH (ref 70–99)
Potassium: 3.9 mmol/L (ref 3.5–5.1)
Sodium: 138 mmol/L (ref 135–145)

## 2023-02-24 LAB — MAGNESIUM: Magnesium: 2 mg/dL (ref 1.7–2.4)

## 2023-02-24 MED ORDER — POTASSIUM CHLORIDE CRYS ER 20 MEQ PO TBCR
40.0000 meq | EXTENDED_RELEASE_TABLET | Freq: Once | ORAL | Status: AC
  Administered 2023-02-24: 40 meq via ORAL
  Filled 2023-02-24: qty 2

## 2023-02-24 MED ORDER — MAGNESIUM SULFATE 2 GM/50ML IV SOLN
2.0000 g | Freq: Once | INTRAVENOUS | Status: AC
  Administered 2023-02-24: 2 g via INTRAVENOUS
  Filled 2023-02-24: qty 50

## 2023-02-24 MED ORDER — HYDROCORTISONE 1 % EX CREA
1.0000 | TOPICAL_CREAM | Freq: Two times a day (BID) | CUTANEOUS | Status: DC
  Administered 2023-02-24 – 2023-02-25 (×2): 1 via TOPICAL
  Filled 2023-02-24: qty 28

## 2023-02-24 NOTE — Progress Notes (Signed)
Mobility Specialist Progress Note:   02/24/23 0930  Mobility  Activity Ambulated independently in hallway  Level of Assistance Independent  Assistive Device None  Distance Ambulated (ft) 200 ft  Activity Response Tolerated well  Mobility Referral Yes  $Mobility charge 1 Mobility   Pt eager for mobility session. Required no physical assistance throughout. Pt left sitting EOB with all needs met.   Nelta Numbers Mobility Specialist Please contact via SecureChat or  Rehab office at [phone]

## 2023-02-24 NOTE — Care Management (Signed)
1320 02-24-23 Case Manager spoke with the patient regarding co pay cost. Patient is agreeable to zero cost. Patient wants the initial Rx filled via [facility] and Rx refills 30 day supply to be escribed to [facility]. No further needs identified at this time.

## 2023-02-24 NOTE — Progress Notes (Addendum)
Pt complaining of new onset rash & itching around the right side of her neck that has subsequently worsened since yesterday. Pt denies any shortness of breath or constricted airway symptoms. Attempted to notify Humphrey Rolls, MD via text page. Will continue to monitor.   Elaina Hoops, RN

## 2023-02-24 NOTE — Progress Notes (Signed)
Electrophysiology Rounding Note  Patient Name: [name] Date of Encounter: 02/24/2023  Primary Cardiologist: Nelva Bush, MD  Electrophysiologist: Vickie Epley, MD    Subjective   Pt remains in NSR on Tikosyn 500 mcg BID   QTc from EKG last pm shows stable QT at 43m, stable QTc at 4582m The patient is doing well today.  At this time, the patient denies chest pain, shortness of breath, or any new concerns.  Inpatient Medications    Scheduled Meds:  apixaban  5 mg Oral BID   clonazePAM  0.5 mg Oral Q12H   diltiazem  180 mg Oral Daily   dofetilide  500 mcg Oral BID   DULoxetine  30 mg Oral BID   furosemide  40 mg Oral Daily   levothyroxine  50 mcg Oral QAC breakfast   lisinopril  10 mg Oral Daily   loratadine  10 mg Oral Daily   metFORMIN  1,000 mg Oral BID WC   montelukast  10 mg Oral QHS   olopatadine  1 drop Both Eyes Daily   pantoprazole  40 mg Oral BID   sodium chloride flush  3 mL Intravenous Q12H   Continuous Infusions:  sodium chloride     PRN Meds: sodium chloride, albuterol, sodium chloride flush   Vital Signs    Vitals:   02/23/23 1947 02/24/23 0539 02/24/23 0833 02/24/23 0943  BP: (!) 110/54 134/67 129/84 124/65  Pulse: 78 80    Resp: 18 18    Temp: 99.2 F (37.3 C) 97.9 F (36.6 C)    TempSrc: Oral Oral    SpO2: 98% 99%      Intake/Output Summary (Last 24 hours) at 02/24/2023 0947 Last data filed at 02/24/2023 0845 Gross per 24 hour  Intake 773 ml  Output --  Net 773 ml    There were no vitals filed for this visit.  Physical Exam    GEN- NAD, A&O x 3. Normal affect.  Lungs- CTAB, Normal effort.  Heart- Regular rate and rhythm. No M/G/R GI- Soft, NT, ND Extremities- No clubbing, cyanosis, or edema Skin- no rash or lesion  Labs    CBC No results for input(s): "WBC", "NEUTROABS", "HGB", "HCT", "MCV", "PLT" in the last 72 hours. Basic Metabolic Panel Recent Labs    02/23/23 0218 02/24/23 0233  NA 136 138  K 3.6  3.9  CL 97* 100  CO2 29 29  GLUCOSE 108* 105*  BUN 9 8  CREATININE 0.66 0.65  CALCIUM 8.5* 8.7*  MG 1.9 2.0     Telemetry    SR w PACs, rate 60-90s  (personally reviewed)  Patient Profile     Nichole WICKENHAUSERs a [age].o. female with a past medical history significant for persistent atrial fibrillation.  They were admitted for tikosyn load.   Assessment & Plan    Persistent atrial fibrillation Pt remains in NSR on Tikosyn 500 mcg BID  Continue Eliquis Creatinine, ser  0.65 (03/06 0233) Magnesium  2.0 (03/06 0233) Potassium3.9 (03/06 0233) Supplement K  Plan for home Thursday if QTc remains stable.  #) NSVT vs VS Discussed with Dr. LaArley Phenix as above Will cont to monitor  #) HFpEF Last echo showed EF 60-65% H/o fluid overload requiring diuresis and admissions Appears euvolemic on exam  #) HTN Normotensive on current regimen  #) OSA Encouraged CPAP usage, patient uses nightly  #) CAD No ischemic symptoms   For questions or updates, please contact  [facility] Please consult [email or web address] for contact info under Cardiology/STEMI.  Signed, Mamie Levers, NP  02/24/2023, 9:47 AM

## 2023-02-24 NOTE — Progress Notes (Signed)
Pharmacy: Dofetilide (Tikosyn) - Follow Up Assessment and Electrolyte Replacement  Pharmacy consulted to assist in monitoring and replacing electrolytes in this 62 y.o. female admitted on 02/22/2023 undergoing dofetilide initiation.   Labs:    Component Value Date/Time   K 3.9 02/24/2023 0233   MG 2.0 02/24/2023 0233     Plan: Potassium: K 3.8-3.9:  Give KCl 40 mEq po x1   Magnesium: Mg 1.8-2: Give Mg 2 gm IV x1    Thank you for allowing pharmacy to participate in this patient's care   Hildred Laser, PharmD Clinical Pharmacist **Pharmacist phone directory can now be found on [email or web address] (PW [email or web address]).  Listed under [place].

## 2023-02-24 NOTE — Progress Notes (Signed)
Morning EKG reviewed     Shows remains in NSR at 75 bpm with stable QTc at 447 ms.  Continue  Tikosyn 500 mcg BID.   Potassium3.9 (03/06 0233) Magnesium  2.0 (03/06 0233) Creatinine, ser  0.65 (03/06 0233)  Plan for home Thursday if QTc remains stable   Mamie Levers, NP  02/24/2023 11:39 AM

## 2023-02-25 ENCOUNTER — Other Ambulatory Visit (HOSPITAL_COMMUNITY): Payer: Self-pay

## 2023-02-25 DIAGNOSIS — I480 Paroxysmal atrial fibrillation: Secondary | ICD-10-CM | POA: Diagnosis not present

## 2023-02-25 LAB — BASIC METABOLIC PANEL
Anion gap: 12 (ref 5–15)
BUN: <5 mg/dL — ABNORMAL LOW (ref 8–23)
CO2: 27 mmol/L (ref 22–32)
Calcium: 9 mg/dL (ref 8.9–10.3)
Chloride: 97 mmol/L — ABNORMAL LOW (ref 98–111)
Creatinine, Ser: 0.76 mg/dL (ref 0.44–1.00)
GFR, Estimated: >60 mL/min (ref >60)
Glucose, Bld: 112 mg/dL — ABNORMAL HIGH (ref 70–99)
Potassium: 4.1 mmol/L (ref 3.5–5.1)
Sodium: 136 mmol/L (ref 135–145)

## 2023-02-25 LAB — MAGNESIUM: Magnesium: 1.8 mg/dL (ref 1.7–2.4)

## 2023-02-25 MED ORDER — LORATADINE 10 MG PO TABS: 10.0000 mg | ORAL_TABLET | Freq: Every day | ORAL | Status: DC

## 2023-02-25 MED ORDER — DULOXETINE HCL 60 MG PO CPEP: 60.0000 mg | ORAL_CAPSULE | Freq: Every day | ORAL | Status: DC

## 2023-02-25 MED ORDER — MAGNESIUM OXIDE 400 MG PO TABS
400.0000 mg | ORAL_TABLET | Freq: Every day | ORAL | 5 refills | Status: DC
  Filled 2023-02-25: qty 30, 30d supply, fill #0

## 2023-02-25 MED ORDER — MAGNESIUM SULFATE 2 GM/50ML IV SOLN
2.0000 g | Freq: Once | INTRAVENOUS | Status: AC
  Administered 2023-02-25: 2 g via INTRAVENOUS
  Filled 2023-02-25: qty 50

## 2023-02-25 MED ORDER — DOFETILIDE 500 MCG PO CAPS
500.0000 ug | ORAL_CAPSULE | Freq: Two times a day (BID) | ORAL | 5 refills | Status: DC
  Filled 2023-02-25: qty 60, 30d supply, fill #0

## 2023-02-25 MED ORDER — POTASSIUM CHLORIDE CRYS ER 20 MEQ PO TBCR
20.0000 meq | EXTENDED_RELEASE_TABLET | Freq: Every day | ORAL | 5 refills | Status: DC
  Filled 2023-02-25: qty 30, 30d supply, fill #0

## 2023-02-25 NOTE — Progress Notes (Signed)
Mobility Specialist Progress Note:   02/25/23 1100  Mobility  Activity Ambulated independently in hallway  Level of Assistance Modified independent, requires aide device or extra time  Assistive Device Cane  Distance Ambulated (ft) 300 ft  Activity Response Tolerated well  Mobility Referral Yes  $Mobility charge 1 Mobility   Pt agreeable to mobility session. Required no physical assist. Ambulated at a modI level. Pt left sitting EOB with all needs met.   Nelta Numbers Mobility Specialist Please contact via SecureChat or  Rehab office at [phone]

## 2023-02-25 NOTE — Progress Notes (Signed)
Pharmacy: Dofetilide (Tikosyn) - Follow Up Assessment and Electrolyte Replacement  Pharmacy consulted to assist in monitoring and replacing electrolytes in this 62 y.o. female admitted on 02/22/2023 undergoing dofetilide initiation.   Labs:    Component Value Date/Time   K 4.1 02/25/2023 0242   MG 1.8 02/25/2023 0242     Plan: Potassium: K >/= 4: No additional supplementation needed  Magnesium: Mg 1.8-2: Give Mg 2 gm IV x1    As patient has required on average 20 mEq of potassium replacement every day (29mq given from 3/4> 3/6(, recommend discharging patient with prescription for:  Potassium chloride 20 mEq  daily  Thank you for allowing pharmacy to participate in this patient's care   AHildred Laser PharmD Clinical Pharmacist **Pharmacist phone directory can now be found on [email or web address] (PW [email or web address]).  Listed under [place]

## 2023-02-25 NOTE — Progress Notes (Signed)
Telemetry remains SR QTc stable Continue Tikosyn this AM, anticipate discharge this afternoon  Tommye Standard, PA-C

## 2023-02-25 NOTE — Plan of Care (Signed)

## 2023-02-25 NOTE — Progress Notes (Signed)
Patient given discharge instructions and verbalized understanding. PIV x 1 removed and patient dressed herself. TOC medications to be picked up on the way to the car today.

## 2023-02-25 NOTE — Discharge Summary (Signed)
ELECTROPHYSIOLOGY PROCEDURE DISCHARGE SUMMARY    Patient ID: [name],  MRN: [id number], DOB/AGE: 05/06/1961 62 y.o.  Admit date: 02/22/2023 Discharge date: 02/25/2023  Primary Care Physician: Hayden Rasmussen, MD  Primary Cardiologist: Dr. Saunders Revel Electrophysiologist: Dr. Quentin Ore   Primary Discharge Diagnosis:  1.  paroxysmal atrial fibrillation status post Tikosyn loading this admission      CHA2DS2Vasc is 5, on Eliquis  Secondary Discharge Diagnosis:  Chronic CHF (HFpEF) Currently compensated HTN DM OSA Morbid obesity BMI 58.89  Allergies  Allergen Reactions   Erythromycin Diarrhea     Procedures This Admission:  1.  Tikosyn loading   Brief HPI: [name] is a 62 y.o. female with a past medical history as noted above.  She is followed by cardiology and EP for AFib.   Risks, benefits, and alternatives to Tikosyn were reviewed with the patient who wished to proceed.    Hospital Course:  The patient was admitted and Tikosyn was initiated.  Renal function and electrolytes were followed during the hospitalization.   The patient's QTc remained stable.  She arrived in SR and maintained SR,.  She was monitored until discharge on telemetry which demonstrated SR.  On the day of discharge, she feels well, was examined by Dr Quentin Ore who considered the patient stable for discharge to home.  Follow-up has been arranged with the AFib clinic in 1 week and the EP team  She has some patchy itching/slight erythema/rash she suspects is 2/2 sheets here, tape, reporting very sensitive skin. Does not appear to be a drug rash/reaction I have advised her t monitor once home, showered and back in her own environment.  Tikosyn teaching was completed electrolyte replacement for home will be lasix '20mg'$  daily and magnesium '400mg'$  daily   Physical Exam: Vitals:   02/24/23 0943 02/24/23 1957 02/25/23 0620 02/25/23 0750  BP: 124/65 (!) 111/55 125/70 130/66  Pulse:  74 70 80  Resp:  '18  18 18  '$ Temp:  99.2 F (37.3 C) 98.9 F (37.2 C) 98.6 F (37 C)  TempSrc:  Oral Axillary Oral  SpO2:  98% 99% 99%  Weight:    (!) 160.5 kg  Height:    '5\' 5"'$  (1.651 m)     GEN- The patient is well appearing, alert and oriented x 3 today.   HEENT: normocephalic, atraumatic; sclera clear, conjunctiva pink; hearing intact; oropharynx clear; neck supple, no JVP Lymph- no cervical lymphadenopathy Lungs- CTA b/l, normal work of breathing.  No wheezes, rales, rhonchi Heart- RRR, no murmurs, rubs or gallops, PMI not laterally displaced GI- soft, non-tender, non-distended Extremities- no clubbing, cyanosis, or edema MS- no significant deformity or atrophy Skin- warm and dry, no rash or lesion Psych- euthymic mood, full affect Neuro- strength and sensation are intact   Labs:   Lab Results  Component Value Date   WBC 8.1 01/07/2023   HGB 10.5 (L) 01/07/2023   HCT 32.4 (L) 01/07/2023   MCV 89.3 01/07/2023   PLT 283 01/07/2023    Recent Labs  Lab 02/25/23 0242  NA 136  K 4.1  CL 97*  CO2 27  BUN <5*  CREATININE 0.76  CALCIUM 9.0  GLUCOSE 112*     Discharge Medications:  Allergies as of 02/25/2023       Reactions   Erythromycin Diarrhea        Medication List     TAKE these medications    albuterol 108 (90 Base) MCG/ACT inhaler Commonly known  as: VENTOLIN HFA Inhale 2 puffs into the lungs every 6 (six) hours as needed for wheezing or shortness of breath.   apixaban 5 MG Tabs tablet Commonly known as: ELIQUIS Take 1 tablet (5 mg total) by mouth every 12 (twelve) hours.   Cholecalciferol 125 MCG (5000 UT) Chew Chew 5,000 Units by mouth daily.   clonazePAM 1 MG tablet Commonly known as: KLONOPIN Take 0.5 mg by mouth every 12 (twelve) hours.   diltiazem 180 MG 24 hr capsule Commonly known as: Cardizem CD Take 1 capsule (180 mg total) by mouth daily.   dofetilide 500 MCG capsule Commonly known as: TIKOSYN Take 1 capsule (500 mcg total) by mouth 2 (two)  times daily.   DULoxetine 30 MG capsule Commonly known as: CYMBALTA Take 60 mg by mouth daily.   fexofenadine 180 MG tablet Commonly known as: ALLEGRA Take 180 mg by mouth daily.   furosemide 40 MG tablet Commonly known as: LASIX Take 1 tablet (40 mg total) by mouth daily for 1 day.   IRON PO Take 1 tablet by mouth daily.   levothyroxine 50 MCG tablet Commonly known as: SYNTHROID Take 50 mcg by mouth daily before breakfast.   lisinopril 10 MG tablet Commonly known as: ZESTRIL Take 1 tablet (10 mg total) by mouth daily.   Magnesium Oxide 400 MG Caps Take 1 capsule (400 mg total) by mouth daily.   metFORMIN 500 MG tablet Commonly known as: GLUCOPHAGE Take 1,000 mg by mouth 2 (two) times daily.   montelukast 10 MG tablet Commonly known as: SINGULAIR Take 10 mg by mouth at bedtime.   multivitamin with minerals Tabs tablet Take 1 tablet by mouth daily.   omeprazole 20 MG capsule Commonly known as: PRILOSEC Take 20 mg by mouth every 12 (twelve) hours.   PATADAY OP Place 1 drop into both eyes daily as needed (itchy eyes).   potassium chloride SA 20 MEQ tablet Commonly known as: KLOR-CON M Take 1 tablet (20 mEq total) by mouth daily.        Disposition: Home Discharge Instructions     Diet - low sodium heart healthy   Complete by: As directed    Increase activity slowly   Complete by: As directed         Duration of Discharge Encounter: Greater than 30 minutes including physician time.  Venetia Night, PA-C 02/25/2023 11:27 AM

## 2023-02-25 NOTE — Progress Notes (Signed)
   Rounding Note    Patient Name: [name] Date of Encounter: 02/25/2023  [place] Cardiologist: Nelva Bush, MD   Subjective   NAEO. Remains in sinus rhythm.  Vital Signs    Vitals:   02/24/23 0833 02/24/23 0943 02/24/23 1957 02/25/23 0620  BP: 129/84 124/65 (!) 111/55 125/70  Pulse:   74 70  Resp:   18 18  Temp:   99.2 F (37.3 C) 98.9 F (37.2 C)  TempSrc:   Oral Axillary  SpO2:   98% 99%    Intake/Output Summary (Last 24 hours) at 02/25/2023 0721 Last data filed at 02/25/2023 0554 Gross per 24 hour  Intake 293 ml  Output --  Net 293 ml      02/22/2023    9:36 AM 02/12/2023    1:27 PM 01/26/2023    1:18 PM  Last 3 Weights  Weight (lbs) 355 lb 12.8 oz 352 lb 2 oz 363 lb  Weight (kg) 161.39 kg 159.723 kg 164.656 kg      Telemetry    sinus - Personally Reviewed  ECG    Sinus. QTc 488m. - Personally Reviewed  Physical Exam   GEN: No acute distress.   Cardiac: RRR, no murmurs, rubs, or gallops.     Assessment & Plan     #Persistent atrial fibrillation Continue Tikosyn 500 mcg by mouth twice daily Continue Eliquis Tentative discharge after AM dose of dofetilide    For questions or updates, please contact [place] NorthPlease consult [email or web address] for contact info under        Signed, CVickie Epley MD  02/25/2023, 7:21 AM

## 2023-02-26 ENCOUNTER — Telehealth: Payer: Self-pay

## 2023-02-26 NOTE — Transitions of Care (Post Inpatient/ED Visit) (Signed)
   02/26/2023  Name: [name] MRN: [id number] DOB: November 27, 1961  Today's TOC FU Call Status: Today's TOC FU Call Status:: Successful TOC FU Call Competed TOC FU Call Complete Date: 02/26/23  Transition Care Management Follow-up Telephone Call Date of Discharge: 02/25/23 Discharge Facility[facility]) Type of Discharge: Inpatient Admission Primary Inpatient Discharge Diagnosis:: Paroxysmal A-Fib How have you been since you were released from the hospital?: Better Any questions or concerns?: No  Items Reviewed: Did you receive and understand the discharge instructions provided?: Yes Medications obtained and verified?: Yes (Medications Reviewed) Any new allergies since your discharge?: No Dietary orders reviewed?: NA Do you have support at home?: Yes People in Home: spouse Name of Support/Comfort Primary Source: Markus Daft, spouse  Home Care and Equipment/Supplies: [place] Ordered?: No Any new equipment or medical supplies ordered?: No  Functional Questionnaire: Do you need assistance with bathing/showering or dressing?: No Do you need assistance with meal preparation?: No Do you need assistance with eating?: No Do you have difficulty maintaining continence: No Do you need assistance with getting out of bed/getting out of a chair/moving?: No Do you have difficulty managing or taking your medications?: No  Folllow up appointments reviewed: PCP Follow-up appointment confirmed?: Yes Date of PCP follow-up appointment?: 03/09/23 Follow-up Provider: [facility] Follow-up appointment confirmed?: Yes Date of Specialist follow-up appointment?: 04/09/23 Follow-Up Specialty Provider:: Brenton Grills Do you need transportation to your follow-up appointment?: No Do you understand care options if your condition(s) worsen?: Yes-patient verbalized understanding  SDOH Interventions Today    Flowsheet Row Most Recent Value  SDOH Interventions    Food Insecurity Interventions Intervention Not Indicated  Housing Interventions Intervention Not Indicated      Johnney Killian, RN, BSN, CCM Care Management Coordinator [facility] Phone: [phone]: [phone]

## 2023-03-01 DIAGNOSIS — F331 Major depressive disorder, recurrent, moderate: Secondary | ICD-10-CM | POA: Diagnosis not present

## 2023-03-01 DIAGNOSIS — F4312 Post-traumatic stress disorder, chronic: Secondary | ICD-10-CM | POA: Diagnosis not present

## 2023-03-01 DIAGNOSIS — F411 Generalized anxiety disorder: Secondary | ICD-10-CM | POA: Diagnosis not present

## 2023-03-03 DIAGNOSIS — Z6841 Body Mass Index (BMI) 40.0 and over, adult: Secondary | ICD-10-CM | POA: Diagnosis not present

## 2023-03-03 DIAGNOSIS — I5032 Chronic diastolic (congestive) heart failure: Secondary | ICD-10-CM | POA: Diagnosis not present

## 2023-03-03 DIAGNOSIS — I4891 Unspecified atrial fibrillation: Secondary | ICD-10-CM | POA: Diagnosis not present

## 2023-03-03 DIAGNOSIS — F331 Major depressive disorder, recurrent, moderate: Secondary | ICD-10-CM | POA: Diagnosis not present

## 2023-03-03 DIAGNOSIS — J449 Chronic obstructive pulmonary disease, unspecified: Secondary | ICD-10-CM | POA: Diagnosis not present

## 2023-03-03 DIAGNOSIS — Z7901 Long term (current) use of anticoagulants: Secondary | ICD-10-CM | POA: Diagnosis not present

## 2023-03-03 DIAGNOSIS — I110 Hypertensive heart disease with heart failure: Secondary | ICD-10-CM | POA: Diagnosis not present

## 2023-03-03 DIAGNOSIS — G4733 Obstructive sleep apnea (adult) (pediatric): Secondary | ICD-10-CM | POA: Diagnosis not present

## 2023-03-03 DIAGNOSIS — E669 Obesity, unspecified: Secondary | ICD-10-CM | POA: Diagnosis not present

## 2023-03-03 DIAGNOSIS — F419 Anxiety disorder, unspecified: Secondary | ICD-10-CM | POA: Diagnosis not present

## 2023-03-08 DIAGNOSIS — F331 Major depressive disorder, recurrent, moderate: Secondary | ICD-10-CM | POA: Diagnosis not present

## 2023-03-08 DIAGNOSIS — I5032 Chronic diastolic (congestive) heart failure: Secondary | ICD-10-CM | POA: Diagnosis not present

## 2023-03-08 DIAGNOSIS — I110 Hypertensive heart disease with heart failure: Secondary | ICD-10-CM | POA: Diagnosis not present

## 2023-03-08 DIAGNOSIS — Z7901 Long term (current) use of anticoagulants: Secondary | ICD-10-CM | POA: Diagnosis not present

## 2023-03-08 DIAGNOSIS — E669 Obesity, unspecified: Secondary | ICD-10-CM | POA: Diagnosis not present

## 2023-03-08 DIAGNOSIS — Z6841 Body Mass Index (BMI) 40.0 and over, adult: Secondary | ICD-10-CM | POA: Diagnosis not present

## 2023-03-08 DIAGNOSIS — I4891 Unspecified atrial fibrillation: Secondary | ICD-10-CM | POA: Diagnosis not present

## 2023-03-08 DIAGNOSIS — J449 Chronic obstructive pulmonary disease, unspecified: Secondary | ICD-10-CM | POA: Diagnosis not present

## 2023-03-08 DIAGNOSIS — F419 Anxiety disorder, unspecified: Secondary | ICD-10-CM | POA: Diagnosis not present

## 2023-03-08 DIAGNOSIS — G4733 Obstructive sleep apnea (adult) (pediatric): Secondary | ICD-10-CM | POA: Diagnosis not present

## 2023-03-09 ENCOUNTER — Ambulatory Visit (HOSPITAL_COMMUNITY)
Admit: 2023-03-09 | Discharge: 2023-03-09 | Disposition: A | Discharge status: Home / Self Care | Payer: 59 | Attending: Physician Assistant | Admitting: Physician Assistant

## 2023-03-09 VITALS — BP 148/76 | HR 86 | Ht 65.0 in | Wt 361.0 lb

## 2023-03-09 DIAGNOSIS — I480 Paroxysmal atrial fibrillation: Secondary | ICD-10-CM | POA: Diagnosis not present

## 2023-03-09 DIAGNOSIS — Z6841 Body Mass Index (BMI) 40.0 and over, adult: Secondary | ICD-10-CM | POA: Diagnosis not present

## 2023-03-09 DIAGNOSIS — Z5181 Encounter for therapeutic drug level monitoring: Secondary | ICD-10-CM | POA: Diagnosis not present

## 2023-03-09 DIAGNOSIS — Z79899 Other long term (current) drug therapy: Secondary | ICD-10-CM

## 2023-03-09 DIAGNOSIS — I5032 Chronic diastolic (congestive) heart failure: Secondary | ICD-10-CM | POA: Insufficient documentation

## 2023-03-09 DIAGNOSIS — D6869 Other thrombophilia: Secondary | ICD-10-CM

## 2023-03-09 DIAGNOSIS — G4733 Obstructive sleep apnea (adult) (pediatric): Secondary | ICD-10-CM | POA: Diagnosis not present

## 2023-03-09 DIAGNOSIS — I2510 Atherosclerotic heart disease of native coronary artery without angina pectoris: Secondary | ICD-10-CM | POA: Insufficient documentation

## 2023-03-09 DIAGNOSIS — E119 Type 2 diabetes mellitus without complications: Secondary | ICD-10-CM | POA: Insufficient documentation

## 2023-03-09 DIAGNOSIS — Z7901 Long term (current) use of anticoagulants: Secondary | ICD-10-CM | POA: Insufficient documentation

## 2023-03-09 DIAGNOSIS — I110 Hypertensive heart disease with heart failure: Secondary | ICD-10-CM | POA: Diagnosis not present

## 2023-03-09 LAB — BASIC METABOLIC PANEL
Anion gap: 9 (ref 5–15)
BUN: 7 mg/dL — ABNORMAL LOW (ref 8–23)
CO2: 30 mmol/L (ref 22–32)
Calcium: 9.2 mg/dL (ref 8.9–10.3)
Chloride: 100 mmol/L (ref 98–111)
Creatinine, Ser: 0.64 mg/dL (ref 0.44–1.00)
GFR, Estimated: >60 mL/min (ref >60)
Glucose, Bld: 142 mg/dL — ABNORMAL HIGH (ref 70–99)
Potassium: 4.1 mmol/L (ref 3.5–5.1)
Sodium: 139 mmol/L (ref 135–145)

## 2023-03-09 LAB — MAGNESIUM: Magnesium: 1.7 mg/dL (ref 1.7–2.4)

## 2023-03-09 MED ORDER — DOFETILIDE 500 MCG PO CAPS: 500.0000 ug | ORAL_CAPSULE | Freq: Two times a day (BID) | ORAL | 3 refills | Status: DC

## 2023-03-09 NOTE — Progress Notes (Signed)
Primary Care Physician: Hayden Rasmussen, MD Primary Cardiologist: Dr End Primary Electrophysiologist: Dr Quentin Ore Referring Physician: Dr Martyn Malay is a 62 y.o. female with a history of HFpEF, HTN, CAD, DM, OSA, atrial fibrillation who presents for follow up in the [facility].  The patient was initially diagnosed with atrial fibrillation in 2015 and has been maintained on verapamil and flecainide for many years. She presented for coronary CTA. Prior to the CTA she took 100 mg of Lopressor as instructed. At the coronary CTA procedure her heart rate was greater than 60 bpm so she was given an additional 10 mg of IV Lopressor. This led to bradycardia and hypotension. IV fluids were administered and she was transported to the ER. There she was found to be in a junctional rhythm in the 30s. She was admitted to the ICU where she required epinephrine and then norepinephrine for hemodynamic support. Her flecainide was discontinued, not felt to be a good candidate for multiple instances of decompensated CHF. Patient is on Eliquis for a CHADS2VASC score of 5.   On follow up today, patient is s/p dofetilide admission 3/4-02/25/23. She remains in SR today. No bleeding issues on anticoagulation. She has noted more fatigue/sleepiness recently.   Today, she denies symptoms of palpitations, chest pain, shortness of breath, orthopnea, PND, lower extremity edema, dizziness, presyncope, syncope, bleeding, or neurologic sequela. The patient is tolerating medications without difficulties and is otherwise without complaint today.    Atrial Fibrillation Risk Factors:  she does have symptoms or diagnosis of sleep apnea. she is compliant with CPAP therapy. she does not have a history of rheumatic fever.   she has a BMI of Body mass index is 60.07 kg/m.Marland Kitchen Filed Weights   03/09/23 1013  Weight: (!) 163.7 kg    Family History  Problem Relation Age of Onset   Heart  Problems Mother    Hypertension Mother    Angina Mother    Atrial fibrillation Mother    Heart Problems Father    Hypertension Father    Kidney Stones Sister    Kidney cancer Neg Hx    Prostate cancer Neg Hx    Bladder Cancer Neg Hx      Atrial Fibrillation Management history:  Previous antiarrhythmic drugs: flecainide, dofetilide   Previous cardioversions: none Previous ablations: none CHADS2VASC score: 5 Anticoagulation history: Eliquis   Past Medical History:  Diagnosis Date   Anxiety 08/23/2014   Chronic heart failure with preserved ejection fraction (HFpEF) ([place])    a. 02/2000 MUGA: EF 68%; b. 06/2005 Echo: EF 55-65%; c. 06/2017 Echo: EF 60-65%; d. 08/2020 Echo: EF 60-65%, no rwma, nl RV fxn. Mild BAE. Mild MR.   Current moderate episode of major depressive disorder without prior episode ([place]) 12/31/2017   Dependent edema 10/05/2011   DNR (do not resuscitate) 12/06/2022   GERD (gastroesophageal reflux disease) 12/22/1999   Formatting of this note might be different from the original. Controlled by OTC meds   History of breast cancer 10/05/2011   Hypertension 10/05/2011   Long term current use of anticoagulant therapy 08/29/2014   Mild persistent asthma without complication [id number]   Morbid obesity with BMI of 60.0-69.9, adult ([place]) 09/06/2018   Non-obstructive CAD (coronary artery disease)    a. 12/2017 Lexiscan PET/CT: mid anterior, apical lateral, and apical ischemia; b. 01/2018 Cath: LM nl, LAD & LCX 10-30% diff dzs throughout, RCA min irregs (<10%)-->Med rx.   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 Collene Mares, [place]  [facility]   COLONOSCOPY WITH PROPOFOL N/A 11/28/2021   Procedure: COLONOSCOPY WITH PROPOFOL;  Surgeon: Carol Ada, MD;  Location: [facility] ENDOSCOPY;  Service: Endoscopy;  Laterality: N/A;   FINGER SURGERY Right    MASTECTOMY Right     Current Outpatient Medications  Medication Sig Dispense Refill   albuterol (PROVENTIL HFA;VENTOLIN HFA) 108 (90 Base) MCG/ACT inhaler Inhale 2 puffs into the lungs every 6 (six) hours as needed for wheezing or shortness of breath. 1 Inhaler 2   apixaban (ELIQUIS) 5 MG TABS tablet Take 1 tablet (5 mg total) by mouth every 12 (twelve) hours. 60 tablet 11   Cholecalciferol 125 MCG (5000 UT) CHEW Chew 5,000 Units by mouth daily.     clonazePAM (KLONOPIN) 0.25 MG disintegrating tablet Take 0.25 mg by mouth 2 (two) times daily.     diltiazem (CARDIZEM CD) 180 MG 24 hr capsule Take 1 capsule (180 mg total) by mouth daily. 30 capsule 3   dofetilide (TIKOSYN) 500 MCG capsule Take 1 capsule (500 mcg total) by mouth 2 (two) times daily. 60 capsule 5   DULoxetine (CYMBALTA) 60 MG capsule Take 60 mg by mouth daily.     Ferrous Sulfate (IRON PO) Take 1 tablet by mouth daily.     fexofenadine (ALLEGRA) 180 MG tablet Take 180 mg by mouth daily.     furosemide (LASIX) 40 MG tablet Take 1 tablet (40 mg total) by mouth daily for 1 day. 30 tablet    levothyroxine (SYNTHROID) 50 MCG tablet Take 50 mcg by mouth daily before breakfast.     lisinopril (ZESTRIL) 10 MG tablet Take 1 tablet (10 mg total) by mouth daily. 90 tablet 3   magnesium oxide (MAG-OX) 400 MG tablet Take 1 tablet by mouth daily. 30 tablet 5   metFORMIN (GLUCOPHAGE) 500 MG tablet Take 1,000 mg by mouth 2 (two) times daily.     montelukast (SINGULAIR) 10 MG tablet Take 10 mg by mouth at bedtime.     Multiple Vitamin (MULTIVITAMIN WITH MINERALS) TABS tablet Take 1 tablet by mouth daily.     Olopatadine HCl (PATADAY OP) Place 1 drop into both eyes daily as needed (itchy eyes).     omeprazole (PRILOSEC) 20  MG capsule Take 20 mg by mouth every 12 (twelve) hours.     potassium chloride SA (KLOR-CON M) 20 MEQ tablet Take 1 tablet (20 mEq total) by mouth daily. 30 tablet 5   No current facility-administered medications for this encounter.    Allergies  Allergen Reactions   Erythromycin Diarrhea    Social History   Socioeconomic History   Marital status: Married    Spouse name: Not on file   Number of children: Not on file   Years of education: Not on file   Highest education level: Not on file  Occupational History   Not on file  Tobacco Use   Smoking status: Former    Packs/day: 0.30    Years: 4.00    Additional pack years: 0.00    Total pack years: 1.20    Types: Cigarettes    Quit date: 12/22/1983    Years since quitting:  39.2   Smokeless tobacco: Never   Tobacco comments:    Former smoker 01/26/23  Vaping Use   Vaping Use: Never used  Substance and Sexual Activity   Alcohol use: Yes    Comment: "not enough"   Drug use: No   Sexual activity: Not on file  Other Topics Concern   Not on file  Social History Narrative   Not on file   Social Determinants of Health   Financial Resource Strain: Not on file  Food Insecurity: No Food Insecurity (02/26/2023)   Hunger Vital Sign    Worried About Running Out of Food in the Last Year: Never true    Ran Out of Food in the Last Year: Never true  Transportation Needs: No Transportation Needs (02/22/2023)   PRAPARE - Hydrologist (Medical): No    Lack of Transportation (Non-Medical): No  Physical Activity: Not on file  Stress: Not on file  Social Connections: Not on file  Intimate Partner Violence: Not At Risk (02/22/2023)   Humiliation, Afraid, Rape, and Kick questionnaire    Fear of Current or Ex-Partner: No    Emotionally Abused: No    Physically Abused: No    Sexually Abused: No     ROS- All systems are reviewed and negative except as per the HPI above.  Physical Exam: Vitals:   03/09/23  1013  BP: (!) 148/76  Pulse: 86  Weight: (!) 163.7 kg  Height: 5\' 5"  (1.651 m)    GEN- The patient is a well appearing female, alert and oriented x 3 today.   HEENT-head normocephalic, atraumatic, sclera clear, conjunctiva pink, hearing intact, trachea midline. Lungs- Clear to ausculation bilaterally, normal work of breathing Heart- Regular rate and rhythm, no murmurs, rubs or gallops  GI- soft, NT, ND, + BS Extremities- no clubbing, cyanosis, or edema MS- no significant deformity or atrophy Skin- no rash or lesion Psych- euthymic mood, full affect Neuro- strength and sensation are intact   Wt Readings from Last 3 Encounters:  03/09/23 (!) 163.7 kg  02/25/23 (!) 160.5 kg  02/22/23 (!) 161.4 kg    EKG today demonstrates  SR, PAC Vent. rate 86 BPM PR interval 140 ms QRS duration 82 ms QT/QTcB 390/466 ms  Echo 01/05/23 demonstrated   1. Left ventricular ejection fraction, by estimation, is 60 to 65%. The  left ventricle has normal function. Left ventricular endocardial border  not optimally defined to evaluate regional wall motion. The left  ventricular internal cavity size was mildly dilated. There is mild left ventricular hypertrophy. Left ventricular diastolic parameters were normal.   2. Right ventricular systolic function is normal. The right ventricular  size is mildly enlarged. There is moderately elevated pulmonary artery  systolic pressure.   3. Right atrial size was mildly dilated.   4. The mitral valve is grossly normal. Trivial mitral valve  regurgitation.   5. The aortic valve was not well visualized. Aortic valve regurgitation  is not visualized. No aortic stenosis is present.   6. The inferior vena cava is dilated in size with <50% respiratory  variability, suggesting right atrial pressure of 15 mmHg.   Epic records are reviewed at length today  CHA2DS2-VASc Score = 5  The patient's score is based upon: CHF History: 1 HTN History: 1 Diabetes History:  1 Stroke History: 0 Vascular Disease History: 1 Age Score: 0 Gender Score: 1       ASSESSMENT AND PLAN: 1. Paroxysmal Atrial Fibrillation (ICD10:  I48.0) The patient's CHA2DS2-VASc score is 5, indicating a 7.2% annual risk of stroke.   S/p dofetilide admission 3/4-02/25/23 Patient appears to be maintaining SR. Continue dofetilide 500 mcg BID. QT stable. Check bmet/mag today.  Continue Eliquis 5 mg BID Continue diltiazem 180 mg daily  2. Secondary Hypercoagulable State (ICD10:  D68.69) The patient is at significant risk for stroke/thromboembolism based upon her CHA2DS2-VASc Score of 5.  Continue Apixaban (Eliquis).   3. Obesity Body mass index is 60.07 kg/m. Lifestyle modification was discussed and encouraged including regular physical activity and weight reduction.  4. Obstructive sleep apnea Encouraged compliance with CPAP therapy.  5. Chronic HFpEF Fluid status appears stable today.  6. HTN Stable, no changes today.    Follow up with Oda Kilts as scheduled. AF clinic in 4 months.    [facility] [address] [place], [place] [address] [phone] 03/09/2023 10:50 AM

## 2023-03-10 DIAGNOSIS — Z6841 Body Mass Index (BMI) 40.0 and over, adult: Secondary | ICD-10-CM | POA: Diagnosis not present

## 2023-03-10 DIAGNOSIS — G4733 Obstructive sleep apnea (adult) (pediatric): Secondary | ICD-10-CM | POA: Diagnosis not present

## 2023-03-10 DIAGNOSIS — F331 Major depressive disorder, recurrent, moderate: Secondary | ICD-10-CM | POA: Diagnosis not present

## 2023-03-10 DIAGNOSIS — E669 Obesity, unspecified: Secondary | ICD-10-CM | POA: Diagnosis not present

## 2023-03-10 DIAGNOSIS — J449 Chronic obstructive pulmonary disease, unspecified: Secondary | ICD-10-CM | POA: Diagnosis not present

## 2023-03-10 DIAGNOSIS — F419 Anxiety disorder, unspecified: Secondary | ICD-10-CM | POA: Diagnosis not present

## 2023-03-10 DIAGNOSIS — I5032 Chronic diastolic (congestive) heart failure: Secondary | ICD-10-CM | POA: Diagnosis not present

## 2023-03-10 DIAGNOSIS — Z7901 Long term (current) use of anticoagulants: Secondary | ICD-10-CM | POA: Diagnosis not present

## 2023-03-10 DIAGNOSIS — I110 Hypertensive heart disease with heart failure: Secondary | ICD-10-CM | POA: Diagnosis not present

## 2023-03-10 DIAGNOSIS — I4891 Unspecified atrial fibrillation: Secondary | ICD-10-CM | POA: Diagnosis not present

## 2023-03-12 ENCOUNTER — Other Ambulatory Visit (HOSPITAL_COMMUNITY): Payer: Self-pay | Admitting: *Deleted

## 2023-03-12 ENCOUNTER — Encounter (HOSPITAL_COMMUNITY): Payer: Self-pay

## 2023-03-12 MED ORDER — MAGNESIUM OXIDE 400 MG PO TABS: 400.0000 mg | ORAL_TABLET | Freq: Two times a day (BID) | ORAL | 5 refills | Status: DC

## 2023-03-15 DIAGNOSIS — J449 Chronic obstructive pulmonary disease, unspecified: Secondary | ICD-10-CM | POA: Diagnosis not present

## 2023-03-15 DIAGNOSIS — I110 Hypertensive heart disease with heart failure: Secondary | ICD-10-CM | POA: Diagnosis not present

## 2023-03-15 DIAGNOSIS — Z7901 Long term (current) use of anticoagulants: Secondary | ICD-10-CM | POA: Diagnosis not present

## 2023-03-15 DIAGNOSIS — F419 Anxiety disorder, unspecified: Secondary | ICD-10-CM | POA: Diagnosis not present

## 2023-03-15 DIAGNOSIS — F331 Major depressive disorder, recurrent, moderate: Secondary | ICD-10-CM | POA: Diagnosis not present

## 2023-03-15 DIAGNOSIS — G4733 Obstructive sleep apnea (adult) (pediatric): Secondary | ICD-10-CM | POA: Diagnosis not present

## 2023-03-15 DIAGNOSIS — I5032 Chronic diastolic (congestive) heart failure: Secondary | ICD-10-CM | POA: Diagnosis not present

## 2023-03-15 DIAGNOSIS — Z6841 Body Mass Index (BMI) 40.0 and over, adult: Secondary | ICD-10-CM | POA: Diagnosis not present

## 2023-03-15 DIAGNOSIS — I4891 Unspecified atrial fibrillation: Secondary | ICD-10-CM | POA: Diagnosis not present

## 2023-03-15 DIAGNOSIS — E669 Obesity, unspecified: Secondary | ICD-10-CM | POA: Diagnosis not present

## 2023-03-17 DIAGNOSIS — Z6841 Body Mass Index (BMI) 40.0 and over, adult: Secondary | ICD-10-CM | POA: Diagnosis not present

## 2023-03-17 DIAGNOSIS — I4891 Unspecified atrial fibrillation: Secondary | ICD-10-CM | POA: Diagnosis not present

## 2023-03-17 DIAGNOSIS — G4733 Obstructive sleep apnea (adult) (pediatric): Secondary | ICD-10-CM | POA: Diagnosis not present

## 2023-03-17 DIAGNOSIS — I5032 Chronic diastolic (congestive) heart failure: Secondary | ICD-10-CM | POA: Diagnosis not present

## 2023-03-17 DIAGNOSIS — F331 Major depressive disorder, recurrent, moderate: Secondary | ICD-10-CM | POA: Diagnosis not present

## 2023-03-17 DIAGNOSIS — F419 Anxiety disorder, unspecified: Secondary | ICD-10-CM | POA: Diagnosis not present

## 2023-03-17 DIAGNOSIS — I110 Hypertensive heart disease with heart failure: Secondary | ICD-10-CM | POA: Diagnosis not present

## 2023-03-17 DIAGNOSIS — Z7901 Long term (current) use of anticoagulants: Secondary | ICD-10-CM | POA: Diagnosis not present

## 2023-03-17 DIAGNOSIS — E669 Obesity, unspecified: Secondary | ICD-10-CM | POA: Diagnosis not present

## 2023-03-17 DIAGNOSIS — J449 Chronic obstructive pulmonary disease, unspecified: Secondary | ICD-10-CM | POA: Diagnosis not present

## 2023-03-22 ENCOUNTER — Other Ambulatory Visit (HOSPITAL_COMMUNITY): Payer: Self-pay

## 2023-03-22 MED ORDER — DILTIAZEM HCL ER COATED BEADS 180 MG PO CP24: 180.0000 mg | ORAL_CAPSULE | Freq: Every day | ORAL | 6 refills | Status: DC

## 2023-03-22 MED ORDER — POTASSIUM CHLORIDE CRYS ER 20 MEQ PO TBCR: 20.0000 meq | EXTENDED_RELEASE_TABLET | Freq: Every day | ORAL | 6 refills | Status: DC

## 2023-03-22 NOTE — Telephone Encounter (Signed)
Patient called in needing refills on her on her [place] Con 20 meq and her diltiazem 180 mg. Sent in her medications to her pharmacy. Communicated with patient and she verbalized understanding.

## 2023-03-24 ENCOUNTER — Ambulatory Visit: Payer: 59 | Admitting: Dietician

## 2023-04-02 DIAGNOSIS — G4733 Obstructive sleep apnea (adult) (pediatric): Secondary | ICD-10-CM | POA: Diagnosis not present

## 2023-04-02 DIAGNOSIS — E119 Type 2 diabetes mellitus without complications: Secondary | ICD-10-CM | POA: Diagnosis not present

## 2023-04-02 DIAGNOSIS — J449 Chronic obstructive pulmonary disease, unspecified: Secondary | ICD-10-CM | POA: Diagnosis not present

## 2023-04-02 DIAGNOSIS — Z7901 Long term (current) use of anticoagulants: Secondary | ICD-10-CM | POA: Diagnosis not present

## 2023-04-02 DIAGNOSIS — F411 Generalized anxiety disorder: Secondary | ICD-10-CM | POA: Diagnosis not present

## 2023-04-02 DIAGNOSIS — I480 Paroxysmal atrial fibrillation: Secondary | ICD-10-CM | POA: Diagnosis not present

## 2023-04-02 DIAGNOSIS — I110 Hypertensive heart disease with heart failure: Secondary | ICD-10-CM | POA: Diagnosis not present

## 2023-04-02 DIAGNOSIS — Z7984 Long term (current) use of oral hypoglycemic drugs: Secondary | ICD-10-CM | POA: Diagnosis not present

## 2023-04-02 DIAGNOSIS — F419 Anxiety disorder, unspecified: Secondary | ICD-10-CM | POA: Diagnosis not present

## 2023-04-02 DIAGNOSIS — I5032 Chronic diastolic (congestive) heart failure: Secondary | ICD-10-CM | POA: Diagnosis not present

## 2023-04-02 DIAGNOSIS — F331 Major depressive disorder, recurrent, moderate: Secondary | ICD-10-CM | POA: Diagnosis not present

## 2023-04-02 DIAGNOSIS — F4312 Post-traumatic stress disorder, chronic: Secondary | ICD-10-CM | POA: Diagnosis not present

## 2023-04-07 DIAGNOSIS — G4733 Obstructive sleep apnea (adult) (pediatric): Secondary | ICD-10-CM | POA: Diagnosis not present

## 2023-04-07 DIAGNOSIS — Z7984 Long term (current) use of oral hypoglycemic drugs: Secondary | ICD-10-CM | POA: Diagnosis not present

## 2023-04-07 DIAGNOSIS — Z7901 Long term (current) use of anticoagulants: Secondary | ICD-10-CM | POA: Diagnosis not present

## 2023-04-07 DIAGNOSIS — J449 Chronic obstructive pulmonary disease, unspecified: Secondary | ICD-10-CM | POA: Diagnosis not present

## 2023-04-07 DIAGNOSIS — E119 Type 2 diabetes mellitus without complications: Secondary | ICD-10-CM | POA: Diagnosis not present

## 2023-04-07 DIAGNOSIS — I5032 Chronic diastolic (congestive) heart failure: Secondary | ICD-10-CM | POA: Diagnosis not present

## 2023-04-07 DIAGNOSIS — I480 Paroxysmal atrial fibrillation: Secondary | ICD-10-CM | POA: Diagnosis not present

## 2023-04-07 DIAGNOSIS — F331 Major depressive disorder, recurrent, moderate: Secondary | ICD-10-CM | POA: Diagnosis not present

## 2023-04-07 DIAGNOSIS — I110 Hypertensive heart disease with heart failure: Secondary | ICD-10-CM | POA: Diagnosis not present

## 2023-04-07 DIAGNOSIS — F419 Anxiety disorder, unspecified: Secondary | ICD-10-CM | POA: Diagnosis not present

## 2023-04-09 ENCOUNTER — Encounter: Payer: Self-pay | Admitting: Student

## 2023-04-09 ENCOUNTER — Ambulatory Visit: Payer: 59 | Attending: Student | Admitting: Student

## 2023-04-09 VITALS — BP 138/74 | HR 89 | Ht 65.0 in | Wt 363.4 lb

## 2023-04-09 DIAGNOSIS — D6869 Other thrombophilia: Secondary | ICD-10-CM | POA: Diagnosis not present

## 2023-04-09 DIAGNOSIS — I480 Paroxysmal atrial fibrillation: Secondary | ICD-10-CM | POA: Diagnosis not present

## 2023-04-09 DIAGNOSIS — I5032 Chronic diastolic (congestive) heart failure: Secondary | ICD-10-CM

## 2023-04-09 DIAGNOSIS — I10 Essential (primary) hypertension: Secondary | ICD-10-CM | POA: Diagnosis not present

## 2023-04-09 NOTE — Progress Notes (Signed)
  Electrophysiology Office Note:   Date:  04/09/2023  ID:  [name], DOB 1961-01-12, MRN [id number]  Primary Cardiologist: Yvonne Kendall, MD Electrophysiologist: Lanier Prude, MD   History of Present Illness:   [name] is a 62 y.o. female with h/o HFpEF, HTN, CAD, DM, OSA, and atrial fibrillation  seen today for routine electrophysiology followup.   Admitted 02/2023 for Tikosyn admission.   Since last being seen in our clinic the patient reports doing very well.  she denies chest pain, palpitations, dyspnea, PND, orthopnea, nausea, vomiting, dizziness, syncope, edema, weight gain, or early satiety.   Review of systems complete and found to be negative unless listed in HPI.   Studies Reviewed:    EKG is ordered today. Personal review shows NSR with stable QTc    Risk Assessment/Calculations:            Physical Exam:   VS:  BP 138/74   Pulse 89   Ht  (1.651 m)   Wt (!) 363 lb 6.4 oz (164.8 kg)   SpO2 97%   BMI 60.47 kg/m    Wt Readings from Last 3 Encounters:  04/09/23 (!) 363 lb 6.4 oz (164.8 kg)  03/09/23 (!) 361 lb (163.7 kg)  02/25/23 (!) 353 lb 14.4 oz (160.5 kg)     GEN: Well nourished, well developed in no acute distress NECK: No JVD; No carotid bruits CARDIAC: Regular rate and rhythm, no murmurs, rubs, gallops RESPIRATORY:  Clear to auscultation without rales, wheezing or rhonchi  ABDOMEN: Soft, non-tender, non-distended EXTREMITIES:  No edema; No deformity   ASSESSMENT AND PLAN:    Paroxysmal Atrial Fibrillation (ICD10:  I48.0) The patient's CHA2DS2-VASc score is 5, indicating a 7.2% annual risk of stroke.   S/p dofetilide admission 3/4-02/25/23 EKG today shows NSR at 89 bpm with stable intervals Continue dofetilide 500 mcg BID.  BMET/Mg today. Continue Eliquis 5 mg BID Continue diltiazem 180 mg daily   Obesity Body mass index is 60.47 kg/m.  Lifestyle modification was discussed and encouraged including regular physical activity and  weight reduction.   OSA  Encouraged nightly CPAP    Chronic HFpEF Volume status stable on exam.    HTN Stable on current regimen     Follow up with Dr. Lalla Brothers in 3 months  Signed, Graciella Freer, PA-C

## 2023-04-09 NOTE — Patient Instructions (Signed)
Medication Instructions:  Your physician recommends that you continue on your current medications as directed. Please refer to the Current Medication list given to you today.  *If you need a refill on your cardiac medications before your next appointment, please call your pharmacy*  Lab Work: BMET, MAG--TODAY If you have labs (blood work) drawn today and your tests are completely normal, you will receive your results only by: MyChart Message (if you have MyChart) OR A paper copy in the mail If you have any lab test that is abnormal or we need to change your treatment, we will call you to review the results.  Follow-Up: At [facility], you and your health needs are our priority.  As part of our continuing mission to provide you with exceptional heart care, we have created designated Provider Care Teams.  These Care Teams include your primary Cardiologist (physician) and Advanced Practice Providers (APPs -  Physician Assistants and Nurse Practitioners) who all work together to provide you with the care you need, when you need it.  Your next appointment:   3 month(s)  Provider:   Cameron Lambert, MD  

## 2023-04-10 LAB — BASIC METABOLIC PANEL
BUN/Creatinine Ratio: 16 (ref 12–28)
BUN: 9 mg/dL (ref 8–27)
CO2: 23 mmol/L (ref 20–29)
Calcium: 9.2 mg/dL (ref 8.7–10.3)
Chloride: 98 mmol/L (ref 96–106)
Creatinine, Ser: 0.57 mg/dL (ref 0.57–1.00)
Glucose: 146 mg/dL — ABNORMAL HIGH (ref 70–99)
Potassium: 4.4 mmol/L (ref 3.5–5.2)
Sodium: 139 mmol/L (ref 134–144)
eGFR: 103 mL/min/{1.73_m2} (ref >59)

## 2023-04-10 LAB — MAGNESIUM: Magnesium: 1.8 mg/dL (ref 1.6–2.3)

## 2023-04-12 DIAGNOSIS — F419 Anxiety disorder, unspecified: Secondary | ICD-10-CM | POA: Diagnosis not present

## 2023-04-12 DIAGNOSIS — E119 Type 2 diabetes mellitus without complications: Secondary | ICD-10-CM | POA: Diagnosis not present

## 2023-04-12 DIAGNOSIS — F331 Major depressive disorder, recurrent, moderate: Secondary | ICD-10-CM | POA: Diagnosis not present

## 2023-04-12 DIAGNOSIS — J449 Chronic obstructive pulmonary disease, unspecified: Secondary | ICD-10-CM | POA: Diagnosis not present

## 2023-04-12 DIAGNOSIS — I5032 Chronic diastolic (congestive) heart failure: Secondary | ICD-10-CM | POA: Diagnosis not present

## 2023-04-12 DIAGNOSIS — Z7901 Long term (current) use of anticoagulants: Secondary | ICD-10-CM | POA: Diagnosis not present

## 2023-04-12 DIAGNOSIS — I480 Paroxysmal atrial fibrillation: Secondary | ICD-10-CM | POA: Diagnosis not present

## 2023-04-12 DIAGNOSIS — G4733 Obstructive sleep apnea (adult) (pediatric): Secondary | ICD-10-CM | POA: Diagnosis not present

## 2023-04-12 DIAGNOSIS — Z7984 Long term (current) use of oral hypoglycemic drugs: Secondary | ICD-10-CM | POA: Diagnosis not present

## 2023-04-12 DIAGNOSIS — I110 Hypertensive heart disease with heart failure: Secondary | ICD-10-CM | POA: Diagnosis not present

## 2023-04-14 DIAGNOSIS — G4733 Obstructive sleep apnea (adult) (pediatric): Secondary | ICD-10-CM | POA: Diagnosis not present

## 2023-04-14 DIAGNOSIS — I110 Hypertensive heart disease with heart failure: Secondary | ICD-10-CM | POA: Diagnosis not present

## 2023-04-14 DIAGNOSIS — F419 Anxiety disorder, unspecified: Secondary | ICD-10-CM | POA: Diagnosis not present

## 2023-04-14 DIAGNOSIS — E119 Type 2 diabetes mellitus without complications: Secondary | ICD-10-CM | POA: Diagnosis not present

## 2023-04-14 DIAGNOSIS — F331 Major depressive disorder, recurrent, moderate: Secondary | ICD-10-CM | POA: Diagnosis not present

## 2023-04-14 DIAGNOSIS — I480 Paroxysmal atrial fibrillation: Secondary | ICD-10-CM | POA: Diagnosis not present

## 2023-04-14 DIAGNOSIS — J449 Chronic obstructive pulmonary disease, unspecified: Secondary | ICD-10-CM | POA: Diagnosis not present

## 2023-04-14 DIAGNOSIS — I5032 Chronic diastolic (congestive) heart failure: Secondary | ICD-10-CM | POA: Diagnosis not present

## 2023-04-14 DIAGNOSIS — Z7901 Long term (current) use of anticoagulants: Secondary | ICD-10-CM | POA: Diagnosis not present

## 2023-04-14 DIAGNOSIS — Z7984 Long term (current) use of oral hypoglycemic drugs: Secondary | ICD-10-CM | POA: Diagnosis not present

## 2023-04-21 DIAGNOSIS — Z7984 Long term (current) use of oral hypoglycemic drugs: Secondary | ICD-10-CM | POA: Diagnosis not present

## 2023-04-21 DIAGNOSIS — I5032 Chronic diastolic (congestive) heart failure: Secondary | ICD-10-CM | POA: Diagnosis not present

## 2023-04-21 DIAGNOSIS — F419 Anxiety disorder, unspecified: Secondary | ICD-10-CM | POA: Diagnosis not present

## 2023-04-21 DIAGNOSIS — Z7901 Long term (current) use of anticoagulants: Secondary | ICD-10-CM | POA: Diagnosis not present

## 2023-04-21 DIAGNOSIS — I480 Paroxysmal atrial fibrillation: Secondary | ICD-10-CM | POA: Diagnosis not present

## 2023-04-21 DIAGNOSIS — I110 Hypertensive heart disease with heart failure: Secondary | ICD-10-CM | POA: Diagnosis not present

## 2023-04-21 DIAGNOSIS — E119 Type 2 diabetes mellitus without complications: Secondary | ICD-10-CM | POA: Diagnosis not present

## 2023-04-21 DIAGNOSIS — J449 Chronic obstructive pulmonary disease, unspecified: Secondary | ICD-10-CM | POA: Diagnosis not present

## 2023-04-21 DIAGNOSIS — F331 Major depressive disorder, recurrent, moderate: Secondary | ICD-10-CM | POA: Diagnosis not present

## 2023-04-21 DIAGNOSIS — G4733 Obstructive sleep apnea (adult) (pediatric): Secondary | ICD-10-CM | POA: Diagnosis not present

## 2023-04-23 ENCOUNTER — Telehealth: Payer: Self-pay

## 2023-04-23 DIAGNOSIS — I480 Paroxysmal atrial fibrillation: Secondary | ICD-10-CM | POA: Diagnosis not present

## 2023-04-23 DIAGNOSIS — J449 Chronic obstructive pulmonary disease, unspecified: Secondary | ICD-10-CM | POA: Diagnosis not present

## 2023-04-23 DIAGNOSIS — F419 Anxiety disorder, unspecified: Secondary | ICD-10-CM | POA: Diagnosis not present

## 2023-04-23 DIAGNOSIS — G4733 Obstructive sleep apnea (adult) (pediatric): Secondary | ICD-10-CM | POA: Diagnosis not present

## 2023-04-23 DIAGNOSIS — Z7984 Long term (current) use of oral hypoglycemic drugs: Secondary | ICD-10-CM | POA: Diagnosis not present

## 2023-04-23 DIAGNOSIS — Z7901 Long term (current) use of anticoagulants: Secondary | ICD-10-CM | POA: Diagnosis not present

## 2023-04-23 DIAGNOSIS — I5032 Chronic diastolic (congestive) heart failure: Secondary | ICD-10-CM | POA: Diagnosis not present

## 2023-04-23 DIAGNOSIS — E119 Type 2 diabetes mellitus without complications: Secondary | ICD-10-CM | POA: Diagnosis not present

## 2023-04-23 DIAGNOSIS — F331 Major depressive disorder, recurrent, moderate: Secondary | ICD-10-CM | POA: Diagnosis not present

## 2023-04-23 DIAGNOSIS — I110 Hypertensive heart disease with heart failure: Secondary | ICD-10-CM | POA: Diagnosis not present

## 2023-04-23 NOTE — Addendum Note (Signed)
Addended by: Shona Simpson on: 04/23/2023 02:05 PM   Modules accepted: Orders

## 2023-04-23 NOTE — Telephone Encounter (Signed)
Pt on levothyroxine which cannot be taken within 4 hours of magnesium supplement. Pt already taking magnesium with lunch and dinner - concerned when she could take a third dose. Also concerned taking more magnesium and having diarrhea since already on metformin. Discussed with Jorja Loa PA will try decreasing omeprazole to once a day and recheck mag in 2 weeks. If still below 2.0 then pt is willing to try stopping omeprazole completely but hesitant to do this at this time.

## 2023-04-23 NOTE — Telephone Encounter (Signed)
Spoke with pt and advised of lab results and recommendations per Otilio Saber, PA-C.  Pt states she does take Mg bid.  Pt verbalizes understanding and states she will begin taking Magnesium tid today.  Pt had no further questions at this time.          Mg slightly low. Can we make sure she is taking her Magnesium BID?   If she is, would try to increase magnesium to TID.     If this starts to cause stomach upset, our other option would be too hold omeprazole, which sometimes can decrease magnesium absorption.   Casimiro Needle [address]" [place], [place] 04/12/2023 12:11 PM

## 2023-04-26 ENCOUNTER — Other Ambulatory Visit: Payer: Self-pay

## 2023-04-26 ENCOUNTER — Encounter (HOSPITAL_COMMUNITY): Payer: Self-pay

## 2023-04-26 ENCOUNTER — Emergency Department (HOSPITAL_COMMUNITY)
Admission: EM | Admit: 2023-04-26 | Discharge: 2023-04-27 | Disposition: A | Discharge status: Home / Self Care | Payer: 59 | Attending: Emergency Medicine | Admitting: Emergency Medicine

## 2023-04-26 ENCOUNTER — Emergency Department (HOSPITAL_COMMUNITY): Payer: 59

## 2023-04-26 DIAGNOSIS — R1110 Vomiting, unspecified: Secondary | ICD-10-CM

## 2023-04-26 DIAGNOSIS — I509 Heart failure, unspecified: Secondary | ICD-10-CM | POA: Insufficient documentation

## 2023-04-26 DIAGNOSIS — R197 Diarrhea, unspecified: Secondary | ICD-10-CM | POA: Diagnosis not present

## 2023-04-26 DIAGNOSIS — R112 Nausea with vomiting, unspecified: Secondary | ICD-10-CM | POA: Diagnosis not present

## 2023-04-26 DIAGNOSIS — Z7901 Long term (current) use of anticoagulants: Secondary | ICD-10-CM | POA: Insufficient documentation

## 2023-04-26 DIAGNOSIS — R55 Syncope and collapse: Secondary | ICD-10-CM | POA: Diagnosis not present

## 2023-04-26 DIAGNOSIS — I10 Essential (primary) hypertension: Secondary | ICD-10-CM | POA: Diagnosis not present

## 2023-04-26 LAB — CBG MONITORING, ED: Glucose-Capillary: 174 mg/dL — ABNORMAL HIGH (ref 70–99)

## 2023-04-26 MED ORDER — ONDANSETRON HCL 4 MG/2ML IJ SOLN
4.0000 mg | Freq: Once | INTRAMUSCULAR | Status: AC
  Administered 2023-04-27: 4 mg via INTRAVENOUS
  Filled 2023-04-26: qty 2

## 2023-04-26 NOTE — ED Provider Notes (Signed)
[place] EMERGENCY DEPARTMENT AT [facility] Provider Note   CSN: [id number] Arrival date & time: 04/26/23  2157     History {Add pertinent medical, surgical, social history, OB history to HPI:1} Chief Complaint  Patient presents with   Loss of Consciousness    Pt arrived via ems from home after 2 hour bout of n/v/d led to syncope with + loc EMS vs 190/P 54 12 100%ra meds given 1 liter LR EKG showed prolonged QT wave so no nausea meds    [name] is a 62 y.o. female.  The history is provided by the patient, medical records and the spouse.  Loss of Consciousness [name] is a 62 y.o. female who presents to the Emergency Department complaining of *** Vomiting syncope Sxs started at 8p feeling nauseated.  V.   When EMS arrived needed to have a BM normal then diarrhea x 2.  No hematochezia or melena.   Diaphoretic.   Vomited from 8-9  Syncope 1 prior to EMS arrival. 1 episode for EMS.  Sitting at desk playing solitaire, per husband groggy.  Diaphoresis  No fever.  No HA, CP, AP, sob.    Currently has nausea and fatigue.   Chicken salad sandwich from [employer], potato salad - hurseys, chik fil a sandwhich with fries. hushpuppies.      Hx/o afib, chf, predm  No recent med changes. No missed meds. Takes tikosyn  Home Medications Prior to Admission medications   Medication Sig Start Date End Date Taking? Authorizing Provider  albuterol (PROVENTIL HFA;VENTOLIN HFA) 108 (90 Base) MCG/ACT inhaler Inhale 2 puffs into the lungs every 6 (six) hours as needed for wheezing or shortness of breath. 04/10/17   Lucia Estelle, NP  apixaban (ELIQUIS) 5 MG TABS tablet Take 1 tablet (5 mg total) by mouth every 12 (twelve) hours. 10/01/22   Creig Hines, NP  Cholecalciferol 125 MCG (5000 UT) CHEW Chew 5,000 Units by mouth daily.    [provider]  clonazePAM (KLONOPIN) 0.25 MG disintegrating tablet Take 0.25 mg by mouth daily. 03/01/23   [provider]  diltiazem (CARDIZEM CD) 180 MG 24 hr capsule Take 1 capsule (180 mg total) by mouth daily. 03/22/23   Fenton, Clint R, PA  dofetilide (TIKOSYN) 500 MCG capsule Take 1 capsule (500 mcg total) by mouth 2 (two) times daily. 03/09/23   Fenton, Clint R, PA  DULoxetine (CYMBALTA) 60 MG capsule Take 60 mg by mouth daily. 03/01/23   [provider]  Ferrous Sulfate (IRON PO) Take 1 tablet by mouth daily.    [provider]  fexofenadine (ALLEGRA) 180 MG tablet Take 180 mg by mouth daily.    [provider]  furosemide (LASIX) 40 MG tablet Take 1 tablet (40 mg total) by mouth daily for 1 day. 12/08/22 03/09/23  Joseph Art, DO  levothyroxine (SYNTHROID) 50 MCG tablet Take 50 mcg by mouth daily before breakfast.    [provider]  lisinopril (ZESTRIL) 10 MG tablet Take 1 tablet (10 mg total) by mouth daily. 02/12/23 02/07/24  End, Cristal Deer, MD  magnesium oxide (MAG-OX) 400 MG tablet Take 1 tablet (400 mg total) by mouth 2 (two) times daily. 03/12/23   Fenton, Clint R, PA  metFORMIN (GLUCOPHAGE) 500 MG tablet Take 1,000 mg by mouth 2 (two) times daily. 11/16/22   [provider]  montelukast (SINGULAIR) 10 MG tablet Take 10 mg by mouth at bedtime.    [provider]  Multiple Vitamin (MULTIVITAMIN WITH MINERALS) TABS tablet Take 1 tablet by mouth daily.    [provider]  Olopatadine HCl (PATADAY OP) Place 1 drop into both eyes daily as needed (itchy eyes).    [provider]  omeprazole (PRILOSEC) 20 MG capsule Take 20 mg by mouth every 12 (twelve) hours.    [provider]  potassium chloride SA (KLOR-CON M) 20 MEQ tablet Take 1 tablet (20 mEq total) by mouth daily. 03/22/23   Fenton, Clint R, PA      Allergies    Erythromycin    Review of Systems   Review of Systems  Cardiovascular:  Positive for syncope.  All other systems reviewed and are negative.   Physical Exam Updated Vital Signs BP 126/68    Pulse 81   Temp 97.9 F (36.6 C) (Oral)   Resp 19   Ht 5\' 5"  (1.651 m)   Wt (!) 163.3 kg   SpO2 96%   BMI 59.91 kg/m  Physical Exam Vitals and nursing note reviewed.  Constitutional:      Appearance: She is well-developed.  HENT:     Head: Normocephalic and atraumatic.  Cardiovascular:     Rate and Rhythm: Normal rate and regular rhythm.     Heart sounds: No murmur heard. Pulmonary:     Effort: Pulmonary effort is normal. No respiratory distress.     Breath sounds: Normal breath sounds.  Abdominal:     Palpations: Abdomen is soft.     Tenderness: There is no abdominal tenderness. There is no guarding or rebound.  Musculoskeletal:        General: No tenderness.  Skin:    General: Skin is warm and dry.  Neurological:     Mental Status: She is alert and oriented to person, place, and time.  Psychiatric:        Behavior: Behavior normal.     ED Results / Procedures / Treatments   Labs (all labs ordered are listed, but only abnormal results are displayed) Labs Reviewed - No data to display  EKG EKG Interpretation  Date/Time:  Monday Apr 26 2023 22:10:03 EDT Ventricular Rate:  84 PR Interval:  151 QRS Duration: 96 QT Interval:  392 QTC Calculation: 464 R Axis:   76 Text Interpretation: Sinus rhythm Confirmed by Tilden Fossa [phone]) on 04/26/2023 10:47:59 PM  Radiology No results found.  Procedures Procedures  {Document cardiac monitor, telemetry assessment procedure when appropriate:1}  Medications Ordered in ED Medications - No data to display  ED Course/ Medical Decision Making/ A&P   {   Click here for ABCD2, HEART and other calculatorsREFRESH Note before signing :1}                          Medical Decision Making  ***  {Document critical care time when appropriate:1} {Document review of labs and clinical decision tools ie heart score, Chads2Vasc2 etc:1}  {Document your independent review of radiology images, and any outside  records:1} {Document your discussion with family members, caretakers, and with consultants:1} {Document social determinants of health affecting pt's care:1} {Document your decision making why or why not admission, treatments were needed:1} Final Clinical Impression(s) / ED Diagnoses Final diagnoses:  None    Rx / DC Orders ED Discharge Orders     None

## 2023-04-26 NOTE — ED Triage Notes (Signed)
Pt arrived via ems from home after syncopal episode r/t n/v/d that began 2 hours ago was given 1 liter LR en route no nausea meds due to prolonged QT wave on ekg

## 2023-04-27 DIAGNOSIS — Z7901 Long term (current) use of anticoagulants: Secondary | ICD-10-CM | POA: Diagnosis not present

## 2023-04-27 DIAGNOSIS — I480 Paroxysmal atrial fibrillation: Secondary | ICD-10-CM | POA: Diagnosis not present

## 2023-04-27 DIAGNOSIS — F419 Anxiety disorder, unspecified: Secondary | ICD-10-CM | POA: Diagnosis not present

## 2023-04-27 DIAGNOSIS — J449 Chronic obstructive pulmonary disease, unspecified: Secondary | ICD-10-CM | POA: Diagnosis not present

## 2023-04-27 DIAGNOSIS — I110 Hypertensive heart disease with heart failure: Secondary | ICD-10-CM | POA: Diagnosis not present

## 2023-04-27 DIAGNOSIS — I5032 Chronic diastolic (congestive) heart failure: Secondary | ICD-10-CM | POA: Diagnosis not present

## 2023-04-27 DIAGNOSIS — E119 Type 2 diabetes mellitus without complications: Secondary | ICD-10-CM | POA: Diagnosis not present

## 2023-04-27 DIAGNOSIS — G4733 Obstructive sleep apnea (adult) (pediatric): Secondary | ICD-10-CM | POA: Diagnosis not present

## 2023-04-27 DIAGNOSIS — Z7984 Long term (current) use of oral hypoglycemic drugs: Secondary | ICD-10-CM | POA: Diagnosis not present

## 2023-04-27 DIAGNOSIS — F331 Major depressive disorder, recurrent, moderate: Secondary | ICD-10-CM | POA: Diagnosis not present

## 2023-04-27 LAB — COMPREHENSIVE METABOLIC PANEL
ALT: 39 U/L (ref 0–44)
AST: 42 U/L — ABNORMAL HIGH (ref 15–41)
Albumin: 3.1 g/dL — ABNORMAL LOW (ref 3.5–5.0)
Alkaline Phosphatase: 56 U/L (ref 38–126)
Anion gap: 12 (ref 5–15)
BUN: 10 mg/dL (ref 8–23)
CO2: 25 mmol/L (ref 22–32)
Calcium: 8.7 mg/dL — ABNORMAL LOW (ref 8.9–10.3)
Chloride: 97 mmol/L — ABNORMAL LOW (ref 98–111)
Creatinine, Ser: 0.69 mg/dL (ref 0.44–1.00)
GFR, Estimated: >60 mL/min (ref >60)
Glucose, Bld: 164 mg/dL — ABNORMAL HIGH (ref 70–99)
Potassium: 3.9 mmol/L (ref 3.5–5.1)
Sodium: 134 mmol/L — ABNORMAL LOW (ref 135–145)
Total Bilirubin: 0.4 mg/dL (ref 0.3–1.2)
Total Protein: 6.7 g/dL (ref 6.5–8.1)

## 2023-04-27 LAB — CBC WITH DIFFERENTIAL/PLATELET
Abs Immature Granulocytes: 0.12 10*3/uL — ABNORMAL HIGH (ref 0.00–0.07)
Basophils Absolute: 0.1 10*3/uL (ref 0.0–0.1)
Basophils Relative: 1 %
Eosinophils Absolute: 0 10*3/uL (ref 0.0–0.5)
Eosinophils Relative: 0 %
HCT: 37.3 % (ref 36.0–46.0)
Hemoglobin: 11.6 g/dL — ABNORMAL LOW (ref 12.0–15.0)
Immature Granulocytes: 1 %
Lymphocytes Relative: 11 %
Lymphs Abs: 1.4 10*3/uL (ref 0.7–4.0)
MCH: 27.9 pg (ref 26.0–34.0)
MCHC: 31.1 g/dL (ref 30.0–36.0)
MCV: 89.7 fL (ref 80.0–100.0)
Monocytes Absolute: 0.7 10*3/uL (ref 0.1–1.0)
Monocytes Relative: 5 %
Neutro Abs: 11.3 10*3/uL — ABNORMAL HIGH (ref 1.7–7.7)
Neutrophils Relative %: 82 %
Platelets: 316 10*3/uL (ref 150–400)
RBC: 4.16 MIL/uL (ref 3.87–5.11)
RDW: 14.4 % (ref 11.5–15.5)
WBC: 13.7 10*3/uL — ABNORMAL HIGH (ref 4.0–10.5)
nRBC: 0 % (ref 0.0–0.2)

## 2023-04-27 LAB — MAGNESIUM: Magnesium: 1.8 mg/dL (ref 1.7–2.4)

## 2023-05-03 ENCOUNTER — Ambulatory Visit (INDEPENDENT_AMBULATORY_CARE_PROVIDER_SITE_OTHER): Payer: 59 | Admitting: Psychiatry

## 2023-05-03 ENCOUNTER — Ambulatory Visit: Payer: 59 | Admitting: Psychiatry

## 2023-05-03 DIAGNOSIS — F331 Major depressive disorder, recurrent, moderate: Secondary | ICD-10-CM | POA: Diagnosis not present

## 2023-05-03 NOTE — Progress Notes (Signed)
[facility] Initial Adult Exam  Name: [name] Date: 05/03/2023 MRN: [id number] DOB: 07-19-1961 PCP: Dois Davenport, MD  Time spent: 60 minutes  Guardian/Payee:  patient    Paperwork requested:  No   Reason for Visit /Presenting Problem:  low motivation, difficulty sleeping, anxiety, depression (stronger symptom), no SI, history of breast cancer in 2001, prior attempt to harm self "by overeating" in 2003", has had prior therapy "still having symptoms"; reports seeing Psychiatrist for meds in [place], [place]  Mental Status Exam:    Appearance:   Casual     Behavior:  Appropriate and Sharing  Motor:  Difficulty mobilizing some due to weight  Speech/Language:   Clear and Coherent  Affect:  Depressed and anxious  Mood:  anxious and depressed  Thought process:  goal directed  Thought content:    WNL and Tangential  Sensory/Perceptual disturbances:    WNL  Orientation:  oriented to person, place, time/date, situation, day of week, month of year, year, and stated date of May 03, 2023  Attention:  Fair  Concentration:  Good  Memory:  WNL "Ok"  [employer] of knowledge:   Good  Insight:    Good  Judgment:   Good  Impulse Control:  Good   Reported Symptoms:  see symptoms noted above  Risk Assessment: Danger to Self:  No Self-injurious Behavior: No Danger to Others: No Duty to Warn:no Physical Aggression / Violence:No  Access to Firearms a concern: No  Gang Involvement:No  Patient / guardian was educated about steps to take if suicide or homicide risk level increases between visits: denies any SI. While future psychiatric events cannot be accurately predicted, the patient does not currently require acute inpatient psychiatric care and does not currently meet [employer] involuntary commitment criteria.  Substance Abuse History: Current substance abuse: No     Past Psychiatric History:   Previous psychological history is significant for anxiety, depression, and see  psychiatrist currently in [place], [place] , Dr. Caryn Section Outpatient Providers:Dr Maryruth Bun History of Psych Hospitalization: No  Psychological Testing:  n/a    Abuse History: Victim of No.,  no    Report needed: No. Victim of Neglect:No. Perpetrator of  no   Witness / Exposure to Domestic Violence: No   Protective Services Involvement: No  Witness to [employer] Violence:  No   Family History: Patient confirms info below. Family History  Problem Relation Age of Onset   Heart Problems Mother    Hypertension Mother    Angina Mother    Atrial fibrillation Mother    Heart Problems Father    Hypertension Father    Kidney Stones Sister    Kidney cancer Neg Hx    Prostate cancer Neg Hx    Bladder Cancer Neg Hx     Living situation: the patient lives with their spouse, never had kids, married 13 yrs  Sexual Orientation:  Straight  Relationship Status: married  Name of spouse / other:n/a             If a parent, number of children / ages:no Astronomer; spouse, don't have many friends, parents deceased  Financial Stress:  No   Income/Employment/Disability: husband's income  Financial planner: No   Educational History: Education: some college  Religion/Sprituality/World View:   Protestant  Any cultural differences that may affect / interfere with treatment:  not applicable   Recreation/Hobbies: watching classic movies  Stressors:Health problems   Loss of mother who was killed 09/2018 vehicular manslaughter  Strengths:  Spirituality and Husband  Barriers:  I can't seem to figure out how to moved forward.    Legal History: Pending legal issue / charges:  n/a. History of legal issue / charges:  n/a  Medical History/Surgical History:reviewed and patient confirms info below. Past Medical History:  Diagnosis Date   Anxiety 08/23/2014   Chronic heart failure with preserved ejection fraction (HFpEF) (HCC)    a. 02/2000 MUGA: EF 68%; b. 06/2005 Echo: EF 55-65%;  c. 06/2017 Echo: EF 60-65%; d. 08/2020 Echo: EF 60-65%, no rwma, nl RV fxn. Mild BAE. Mild MR.   Current moderate episode of major depressive disorder without prior episode (HCC) 12/31/2017   Dependent edema 10/05/2011   DNR (do not resuscitate) 12/06/2022   GERD (gastroesophageal reflux disease) 12/22/1999   Formatting of this note might be different from the original. Controlled by OTC meds   History of breast cancer 10/05/2011   Hypertension 10/05/2011   Long term current use of anticoagulant therapy 08/29/2014   Mild persistent asthma without complication 09/06/2018   Morbid obesity with BMI of 60.0-69.9, adult (HCC) 09/06/2018   Non-obstructive CAD (coronary artery disease)    a. 12/2017 Lexiscan PET/CT: mid anterior, apical lateral, and apical ischemia; b. 01/2018 Cath: LM nl, LAD & LCX 10-30% diff dzs throughout, RCA min irregs (<10%)-->Med rx.   Obstructive sleep apnea, adult 09/06/2018   Paroxysmal atrial fibrillation (HCC) 04/21/2019   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/2014   Type 2 diabetes mellitus without complication, without long-term current use of insulin (HCC) 04/21/2019   Urinary bladder incontinence 06/13/2013    Past Surgical History:  Procedure Laterality Date   CARDIAC CATHETERIZATION  02/16/2018   No stents;Dr. Marcial Pacas, [place] [facility]   COLONOSCOPY WITH PROPOFOL N/A 11/28/2021   Procedure: COLONOSCOPY WITH PROPOFOL;  Surgeon: Jeani Hawking, MD;  Location: [facility] ENDOSCOPY;  Service: Endoscopy;  Laterality: N/A;   FINGER SURGERY Right    MASTECTOMY Right     Medications: Current Outpatient Medications  Medication Sig Dispense Refill   albuterol (PROVENTIL HFA;VENTOLIN HFA) 108 (90 Base) MCG/ACT inhaler Inhale 2 puffs into the lungs every 6 (six) hours as needed for wheezing or shortness of breath. 1 Inhaler 2   apixaban (ELIQUIS) 5 MG TABS tablet Take 1 tablet (5 mg total) by mouth every 12 (twelve) hours. 60 tablet 11   Cholecalciferol 125 MCG  (5000 UT) CHEW Chew 5,000 Units by mouth daily.     clonazePAM (KLONOPIN) 0.25 MG disintegrating tablet Take 0.25 mg by mouth daily.     diltiazem (CARDIZEM CD) 180 MG 24 hr capsule Take 1 capsule (180 mg total) by mouth daily. 30 capsule 6   dofetilide (TIKOSYN) 500 MCG capsule Take 1 capsule (500 mcg total) by mouth 2 (two) times daily. 60 capsule 3   DULoxetine (CYMBALTA) 60 MG capsule Take 60 mg by mouth daily.     Ferrous Sulfate (IRON PO) Take 1 tablet by mouth daily.     fexofenadine (ALLEGRA) 180 MG tablet Take 180 mg by mouth daily.     furosemide (LASIX) 40 MG tablet Take 1 tablet (40 mg total) by mouth daily for 1 day. 30 tablet    levothyroxine (SYNTHROID) 50 MCG tablet Take 50 mcg by mouth daily before breakfast.     lisinopril (ZESTRIL) 10 MG tablet Take 1 tablet (10 mg total) by mouth daily. 90 tablet 3   magnesium oxide (MAG-OX) 400 MG tablet Take 1 tablet (400 mg total) by mouth 2 (two)  times daily. 60 tablet 5   metFORMIN (GLUCOPHAGE) 500 MG tablet Take 1,000 mg by mouth 2 (two) times daily.     montelukast (SINGULAIR) 10 MG tablet Take 10 mg by mouth at bedtime.     Multiple Vitamin (MULTIVITAMIN WITH MINERALS) TABS tablet Take 1 tablet by mouth daily.     Olopatadine HCl (PATADAY OP) Place 1 drop into both eyes daily as needed (itchy eyes).     omeprazole (PRILOSEC) 20 MG capsule Take 20 mg by mouth every 12 (twelve) hours.     potassium chloride SA (KLOR-CON M) 20 MEQ tablet Take 1 tablet (20 mEq total) by mouth daily. 30 tablet 6   No current facility-administered medications for this visit.    Allergies  Allergen Reactions   Erythromycin Diarrhea   Diagnoses:    ICD-10-CM   1. Major depressive disorder, recurrent episode, moderate (HCC)  F33.1      Treatment goal plan of care: Patient not signing treatment plan on computer screen due to continued concerns with COVID and other issues.  We did collaboratively work on her treatment plan and she is in agreement with  it.  Goals remain on treatment plan as patient works with strategies in sessions and outside of sessions to meet her goals.  Progress is assessed each session and documented in the "plan" or progress sections of treatment note.  Elevate mood and show evidence of usual energy, activities, and socialization level. 2.   Verbally identify the source of depressed mood to include: death of mother from "car manslaughter", death of friend due to pulmonary embolism.  3.   Replace negative self-defeating self-talk with verbalization of realistic and positive cognitive messages.   Plan of Care:  Today is first session for patient with this therapist.  Lusine Gari is a 62 year old married female, married for 13 yrs. Husband is 21 and relatively good health, and is retired from Theatre stage manager. Patient has 1 brother age 6, and sister is 36 and she reports being close to them but not having many other friends.  Reports husband is supportive.  Was last in therapy about 4 yrs ago at "the [place] in [place], [place]."  States she "almost died in hospital 02/08/2023 of this year due to problem with a procedure". Mom died of vehicular manslaughter in 08-Jun-2018. Friend died in 2010-08-19due to pulmonary embolism. Currently reporting symptoms of depression, anxiety, tearfulness x 2 monthly.Reporting anxiety, depression, sadness, difficulty letting go of things in the past, and reports hx of cancer in 08-Jun-2000 and 08-Jun-2002. Has had prior outpatient psychiatric treatment which "has helped some".  States that her support system is primarily spouse as she does not have many friends.  Both of her parents are deceased.  Reports being a Protestant but rarely attends church, and identifies as a "spiritual person", preferring to watch church services online.  Denies any substance abuse.  Denies any current SI.  Family history and her own personal physical history cited in sections above.  Reports stressors as being health problems, and the loss of  her mother who was killed in a wreck in 09/2018.  Discussed her needs at this point and what is most important for her going forward for [place] to work on in therapy and we agreed that some of her priorities are her depression (especially regarding the death of her mother and some of her personal challenges including cancer in the past), healthy choices, some unresolved anger and frustration, and untangling from unhealthy coping  skills to being able to learn/develop improved coping skills.  Encouraged patient to do some journaling between now and her next appointment within 2 weeks, specifically about the issues discussed today and her treatment goals.  Review of initial treatment goal plan with patient and she is in agreement.  Next appointment within 1-2 weeks.   Mathis Fare, LCSW

## 2023-05-05 ENCOUNTER — Ambulatory Visit: Payer: 59 | Admitting: Orthopaedic Surgery

## 2023-05-05 ENCOUNTER — Encounter: Payer: Self-pay | Admitting: Orthopaedic Surgery

## 2023-05-05 VITALS — Ht 65.0 in | Wt 360.0 lb

## 2023-05-05 DIAGNOSIS — M25561 Pain in right knee: Secondary | ICD-10-CM | POA: Diagnosis not present

## 2023-05-05 DIAGNOSIS — M1711 Unilateral primary osteoarthritis, right knee: Secondary | ICD-10-CM

## 2023-05-05 DIAGNOSIS — M25562 Pain in left knee: Secondary | ICD-10-CM | POA: Diagnosis not present

## 2023-05-05 DIAGNOSIS — G8929 Other chronic pain: Secondary | ICD-10-CM

## 2023-05-05 DIAGNOSIS — M1712 Unilateral primary osteoarthritis, left knee: Secondary | ICD-10-CM | POA: Diagnosis not present

## 2023-05-05 MED ORDER — METHYLPREDNISOLONE ACETATE 40 MG/ML IJ SUSP
40.0000 mg | INTRAMUSCULAR | Status: AC | PRN
  Administered 2023-05-05: 40 mg via INTRA_ARTICULAR

## 2023-05-05 MED ORDER — LIDOCAINE HCL 1 % IJ SOLN
3.0000 mL | INTRAMUSCULAR | Status: AC | PRN
  Administered 2023-05-05: 3 mL

## 2023-05-05 NOTE — Progress Notes (Signed)
The patient is well-known to me.  She has severe debilitating arthritis in both her knees and comes in about every 3 months for steroid injections in both her knees.  She would like to have these again today.  It has been just over 3 months since she has had these injections.  I did review all of her notes within epic to see what she has had going on recently.  She still deals with A-fib and is on medications for this.  She is someone who her most recent BMI that we have recorded is almost 60.  Previous x-rays show bone-on-bone wear both knees with significant varus malalignment.  She says the injections last about 8 weeks.  Both knees have varus malalignment.  I was able to place a steroid injection successfully in both knees today.  She is ambulating with a cane.  She knows that the earliest we can do these is every 3 months.  She does report being a diabetic but her last hemoglobin A1c was in the low 6 range.  She knows that any Steroid injections can increase her blood glucose and she will watch this accordingly.     Procedure Note  Patient: [name]             Date of Birth: 1961/05/09           MRN: [id number]             Visit Date: 05/05/2023  Procedures: Visit Diagnoses:  1. Unilateral primary osteoarthritis, left knee   2. Unilateral primary osteoarthritis, right knee   3. Chronic pain of left knee   4. Chronic pain of right knee     Large Joint Inj: R knee on 05/05/2023 3:27 PM Indications: diagnostic evaluation and pain Details: 22 G 1.5 in needle, superolateral approach  Arthrogram: No  Medications: 3 mL lidocaine 1 %; 40 mg methylPREDNISolone acetate 40 MG/ML Outcome: tolerated well, no immediate complications Procedure, treatment alternatives, risks and benefits explained, specific risks discussed. Consent was given by the patient. Immediately prior to procedure a time out was called to verify the correct patient, procedure, equipment, support staff and site/side marked  as required. Patient was prepped and draped in the usual sterile fashion.    Large Joint Inj: L knee on 05/05/2023 3:27 PM Indications: diagnostic evaluation and pain Details: 22 G 1.5 in needle, superolateral approach  Arthrogram: No  Medications: 3 mL lidocaine 1 %; 40 mg methylPREDNISolone acetate 40 MG/ML Outcome: tolerated well, no immediate complications Procedure, treatment alternatives, risks and benefits explained, specific risks discussed. Consent was given by the patient. Immediately prior to procedure a time out was called to verify the correct patient, procedure, equipment, support staff and site/side marked as required. Patient was prepped and draped in the usual sterile fashion.

## 2023-05-07 ENCOUNTER — Other Ambulatory Visit (HOSPITAL_COMMUNITY): Payer: Self-pay | Admitting: Physician Assistant

## 2023-05-07 DIAGNOSIS — I480 Paroxysmal atrial fibrillation: Secondary | ICD-10-CM | POA: Diagnosis not present

## 2023-05-08 LAB — MAGNESIUM: Magnesium: 1.7 mg/dL (ref 1.6–2.3)

## 2023-05-10 ENCOUNTER — Other Ambulatory Visit (HOSPITAL_COMMUNITY): Payer: Self-pay | Admitting: *Deleted

## 2023-05-10 DIAGNOSIS — I480 Paroxysmal atrial fibrillation: Secondary | ICD-10-CM

## 2023-05-18 ENCOUNTER — Encounter (HOSPITAL_BASED_OUTPATIENT_CLINIC_OR_DEPARTMENT_OTHER): Payer: Self-pay | Admitting: Pulmonary Disease

## 2023-05-18 ENCOUNTER — Ambulatory Visit (HOSPITAL_BASED_OUTPATIENT_CLINIC_OR_DEPARTMENT_OTHER): Payer: 59 | Admitting: Pulmonary Disease

## 2023-05-18 VITALS — BP 126/62 | HR 82 | Temp 98.6°F | Ht 65.0 in | Wt 355.0 lb

## 2023-05-18 DIAGNOSIS — G4700 Insomnia, unspecified: Secondary | ICD-10-CM | POA: Diagnosis not present

## 2023-05-18 DIAGNOSIS — G4730 Sleep apnea, unspecified: Secondary | ICD-10-CM | POA: Diagnosis not present

## 2023-05-18 DIAGNOSIS — G4733 Obstructive sleep apnea (adult) (pediatric): Secondary | ICD-10-CM

## 2023-05-18 NOTE — Patient Instructions (Signed)
Will arrange for new CPAP set up  Follow up in 4 months 

## 2023-05-18 NOTE — Progress Notes (Signed)
[facility]  Chief Complaint  Patient presents with   Follow-up    Follow up. Patient has no complaints.     Past Surgical History:  She  has a past surgical history that includes Mastectomy (Right); Finger surgery (Right); Cardiac catheterization (02/16/2018); and Colonoscopy with propofol (N/A, 11/28/2021).  Past Medical History:  Asthma, A fib, Breast Cancer, GERD, HTN, DM, Diastolic CHF, PAF  Constitutional:  BP 126/62 (BP Location: Left Wrist, Patient Position: Sitting, Cuff Size: Normal)   Pulse 82   Temp 98.6 F (37 C) (Oral)   Ht 5\' 5"  (1.651 m)   Wt (!) 355 lb (161 kg)   SpO2 92%   BMI 59.08 kg/m   Brief Summary:  [name] is a 62 y.o. female former smoker with obstructive sleep apnea.      Subjective:   She is here with her husband.  She has trouble with mask straps.  She hasn't received anything new for a while.  Pressure feels comfortable.  She has trouble staying asleep and gets restless at night.  Physical Exam:   Appearance - well kempt, sitting in a wheelchair  ENMT - no sinus tenderness, no oral exudate, no LAN, Mallampati 3 airway, no stridor, triangular uvula  Respiratory - equal breath sounds bilaterally, no wheezing or rales  CV - s1s2 regular rate and rhythm, no murmurs  Ext - no clubbing, no edema  Skin - no rashes  Psych - normal mood and affect   Pulmonary testing:  Spirometry 09/06/18 >> FEV1 2.20 (82%), FEV1% 79  Sleep Tests:  PSG 09/30/18 >> AHI 76, SpO2 low 72% Auto CPAP 04/17/23 to 05/16/23 >> used on 30 to 30 nights with average 9 hrs.  Average AHI 0.5 with median CPAP 11 and 95 th percentile CPAP 13 cm H2O  Cardiac Tests:  Echo 08/30/20 >> EF 60 to 65%, mild LA/RA dilation, mild MR  Social History:  She  reports that she quit smoking about 39 years ago. Her smoking use included cigarettes. She has a 1.20 pack-year smoking history. She has never used smokeless tobacco. She  reports current alcohol use. She reports that she does not use drugs.  Family History:  Her family history includes Angina in her mother; Atrial fibrillation in her mother; Heart Problems in her father and mother; Hypertension in her father and mother; Kidney Stones in her sister.     Assessment/Plan:   Obstructive sleep apnea. - she is compliant with therapy and reports benefit from CPAP - she uses [employer] for her DME - her current CPAP is more than 62 years old - will arrange for a new Resmed S11 auto CPAP 8 to 13 cm H2O - discussed options to improve mask fit  Insomnia with depression. - discusses sleep restriction, stimulus control and relaxation techniques - will see if this improves some with updated CPAP equipment  Obesity. - discussed importance of weight loss  Chronic diastolic CHF, Paroxysmal atrial fibrillation. - followed by Dr. Cristal Deer End with cardiology  Time Spent Involved in Patient Care on Day of Examination:  26 minutes  Follow up:   Patient Instructions  Will arrange for new CPAP set up  Follow up in 4 months  Medication List:   Allergies as of 05/18/2023       Reactions   Erythromycin Diarrhea        Medication List        Accurate as of May 18, 2023  3:43 PM. If you have any questions, ask your nurse or doctor.          albuterol 108 (90 Base) MCG/ACT inhaler Commonly known as: VENTOLIN HFA Inhale 2 puffs into the lungs every 6 (six) hours as needed for wheezing or shortness of breath.   apixaban 5 MG Tabs tablet Commonly known as: ELIQUIS Take 1 tablet (5 mg total) by mouth every 12 (twelve) hours.   Cholecalciferol 125 MCG (5000 UT) Chew Chew 5,000 Units by mouth daily.   clonazePAM 0.25 MG disintegrating tablet Commonly known as: KLONOPIN Take 0.25 mg by mouth daily.   diltiazem 180 MG 24 hr capsule Commonly known as: Cardizem CD Take 1 capsule (180 mg total) by mouth daily.   dofetilide 500 MCG capsule Commonly  known as: TIKOSYN Take 1 capsule (500 mcg total) by mouth 2 (two) times daily.   DULoxetine 60 MG capsule Commonly known as: CYMBALTA Take 60 mg by mouth daily.   fexofenadine 180 MG tablet Commonly known as: ALLEGRA Take 180 mg by mouth daily.   furosemide 40 MG tablet Commonly known as: LASIX Take 1 tablet (40 mg total) by mouth daily for 1 day.   IRON PO Take 1 tablet by mouth daily.   levothyroxine 50 MCG tablet Commonly known as: SYNTHROID Take 50 mcg by mouth daily before breakfast.   lisinopril 10 MG tablet Commonly known as: ZESTRIL Take 1 tablet (10 mg total) by mouth daily.   magnesium oxide 400 MG tablet Commonly known as: MAG-OX Take 1 tablet (400 mg total) by mouth 2 (two) times daily.   metFORMIN 500 MG tablet Commonly known as: GLUCOPHAGE Take 1,000 mg by mouth 2 (two) times daily.   montelukast 10 MG tablet Commonly known as: SINGULAIR Take 10 mg by mouth at bedtime.   multivitamin with minerals Tabs tablet Take 1 tablet by mouth daily.   PATADAY OP Place 1 drop into both eyes daily as needed (itchy eyes).   potassium chloride SA 20 MEQ tablet Commonly known as: KLOR-CON M Take 1 tablet (20 mEq total) by mouth daily.        Signature:  Coralyn Helling, MD [facility] Pager - [phone] 05/18/2023, 3:43 PM

## 2023-05-21 ENCOUNTER — Ambulatory Visit (INDEPENDENT_AMBULATORY_CARE_PROVIDER_SITE_OTHER): Payer: 59 | Admitting: Psychiatry

## 2023-05-21 DIAGNOSIS — F331 Major depressive disorder, recurrent, moderate: Secondary | ICD-10-CM
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
## 2023-05-25 DIAGNOSIS — G4733 Obstructive sleep apnea (adult) (pediatric): Secondary | ICD-10-CM | POA: Diagnosis not present

## 2023-05-26 ENCOUNTER — Other Ambulatory Visit (HOSPITAL_COMMUNITY): Payer: Self-pay | Admitting: Physician Assistant

## 2023-05-26 DIAGNOSIS — F411 Generalized anxiety disorder: Secondary | ICD-10-CM | POA: Diagnosis not present

## 2023-05-26 DIAGNOSIS — I480 Paroxysmal atrial fibrillation: Secondary | ICD-10-CM | POA: Diagnosis not present

## 2023-05-26 DIAGNOSIS — F4312 Post-traumatic stress disorder, chronic: Secondary | ICD-10-CM | POA: Diagnosis not present

## 2023-05-26 DIAGNOSIS — F331 Major depressive disorder, recurrent, moderate: Secondary | ICD-10-CM | POA: Diagnosis not present

## 2023-05-27 LAB — MAGNESIUM: Magnesium: 1.8 mg/dL (ref 1.6–2.3)

## 2023-06-02 ENCOUNTER — Ambulatory Visit (INDEPENDENT_AMBULATORY_CARE_PROVIDER_SITE_OTHER): Payer: 59 | Admitting: Psychiatry

## 2023-06-02 DIAGNOSIS — F331 Major depressive disorder, recurrent, moderate: Secondary | ICD-10-CM | POA: Diagnosis not present
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
## 2023-06-17 ENCOUNTER — Ambulatory Visit: Payer: 59 | Admitting: Psychiatry

## 2023-06-24 DIAGNOSIS — G4733 Obstructive sleep apnea (adult) (pediatric): Secondary | ICD-10-CM | POA: Diagnosis not present

## 2023-07-08 ENCOUNTER — Telehealth: Payer: Self-pay | Admitting: Psychiatry

## 2023-07-08 ENCOUNTER — Other Ambulatory Visit (HOSPITAL_COMMUNITY): Payer: Self-pay | Admitting: Physician Assistant

## 2023-07-08 NOTE — Telephone Encounter (Signed)
[facility] @ [facility] called requesting referral for [facility] which Rockne Menghini suggested for pt. Fax to [phone] Attn: Meriam Sprague  phone [phone]

## 2023-07-14 ENCOUNTER — Ambulatory Visit (HOSPITAL_COMMUNITY)
Admission: RE | Admit: 2023-07-14 | Discharge: 2023-07-14 | Disposition: A | Discharge status: Home / Self Care | Payer: 59 | Source: Ambulatory Visit | Attending: Physician Assistant | Admitting: Physician Assistant

## 2023-07-14 ENCOUNTER — Encounter (HOSPITAL_COMMUNITY): Payer: Self-pay | Admitting: Physician Assistant

## 2023-07-14 VITALS — BP 124/74 | HR 89 | Ht 65.0 in | Wt 371.6 lb

## 2023-07-14 DIAGNOSIS — Z853 Personal history of malignant neoplasm of breast: Secondary | ICD-10-CM | POA: Insufficient documentation

## 2023-07-14 DIAGNOSIS — Z9011 Acquired absence of right breast and nipple: Secondary | ICD-10-CM | POA: Diagnosis not present

## 2023-07-14 DIAGNOSIS — E119 Type 2 diabetes mellitus without complications: Secondary | ICD-10-CM | POA: Diagnosis not present

## 2023-07-14 DIAGNOSIS — I2510 Atherosclerotic heart disease of native coronary artery without angina pectoris: Secondary | ICD-10-CM | POA: Diagnosis not present

## 2023-07-14 DIAGNOSIS — Z5181 Encounter for therapeutic drug level monitoring: Secondary | ICD-10-CM | POA: Diagnosis not present

## 2023-07-14 DIAGNOSIS — J4530 Mild persistent asthma, uncomplicated: Secondary | ICD-10-CM | POA: Diagnosis not present

## 2023-07-14 DIAGNOSIS — I5032 Chronic diastolic (congestive) heart failure: Secondary | ICD-10-CM | POA: Diagnosis not present

## 2023-07-14 DIAGNOSIS — Z79899 Other long term (current) drug therapy: Secondary | ICD-10-CM

## 2023-07-14 DIAGNOSIS — D6869 Other thrombophilia: Secondary | ICD-10-CM | POA: Insufficient documentation

## 2023-07-14 DIAGNOSIS — G4733 Obstructive sleep apnea (adult) (pediatric): Secondary | ICD-10-CM | POA: Diagnosis not present

## 2023-07-14 DIAGNOSIS — I480 Paroxysmal atrial fibrillation: Secondary | ICD-10-CM | POA: Diagnosis not present

## 2023-07-14 DIAGNOSIS — Z7901 Long term (current) use of anticoagulants: Secondary | ICD-10-CM | POA: Insufficient documentation

## 2023-07-14 DIAGNOSIS — Z6841 Body Mass Index (BMI) 40.0 and over, adult: Secondary | ICD-10-CM | POA: Insufficient documentation

## 2023-07-14 DIAGNOSIS — I110 Hypertensive heart disease with heart failure: Secondary | ICD-10-CM | POA: Insufficient documentation

## 2023-07-14 DIAGNOSIS — K219 Gastro-esophageal reflux disease without esophagitis: Secondary | ICD-10-CM | POA: Diagnosis not present

## 2023-07-14 LAB — BASIC METABOLIC PANEL
Anion gap: 10 (ref 5–15)
BUN: 9 mg/dL (ref 8–23)
CO2: 31 mmol/L (ref 22–32)
Calcium: 9 mg/dL (ref 8.9–10.3)
Chloride: 98 mmol/L (ref 98–111)
Creatinine, Ser: 0.56 mg/dL (ref 0.44–1.00)
GFR, Estimated: >60 mL/min (ref >60)
Glucose, Bld: 121 mg/dL — ABNORMAL HIGH (ref 70–99)
Potassium: 4.7 mmol/L (ref 3.5–5.1)
Sodium: 139 mmol/L (ref 135–145)

## 2023-07-14 LAB — MAGNESIUM: Magnesium: 1.9 mg/dL (ref 1.7–2.4)

## 2023-07-14 NOTE — Progress Notes (Signed)
Primary Care Physician: Dois Davenport, MD Primary Cardiologist: Dr End Primary Electrophysiologist: Dr Lalla Brothers Referring Physician: Dr York Ram is a 62 y.o. female with a history of HFpEF, HTN, CAD, DM, OSA, atrial fibrillation who presents for follow up in the [facility].  The patient was initially diagnosed with atrial fibrillation in 2015 and has been maintained on verapamil and flecainide for many years. She presented for coronary CTA. Prior to the CTA she took 100 mg of Lopressor as instructed. At the coronary CTA procedure her heart rate was greater than 60 bpm so she was given an additional 10 mg of IV Lopressor. This led to bradycardia and hypotension. IV fluids were administered and she was transported to the ER. There she was found to be in a junctional rhythm in the 30s. She was admitted to the ICU where she required epinephrine and then norepinephrine for hemodynamic support. Her flecainide was discontinued, not felt to be a good candidate for multiple instances of decompensated CHF. Patient is on Eliquis for a CHADS2VASC score of 5.   Patient is s/p dofetilide admission 3/4-02/25/23.   On follow up today, patient reports that she has done well from an afib standpoint with no interim symptoms. No bleeding issues on anticoagulation. She does sometimes skip dinner and on those nights she does not take her magnesium or metformin.   Today, she denies symptoms of palpitations, chest pain, shortness of breath, orthopnea, PND, lower extremity edema, dizziness, presyncope, syncope, bleeding, or neurologic sequela. The patient is tolerating medications without difficulties and is otherwise without complaint today.    Atrial Fibrillation Risk Factors:  she does have symptoms or diagnosis of sleep apnea. she is compliant with CPAP therapy. she does not have a history of rheumatic fever.   Atrial Fibrillation Management history:  Previous  antiarrhythmic drugs: flecainide, dofetilide   Previous cardioversions: none Previous ablations: none Anticoagulation history: Eliquis   Past Medical History:  Diagnosis Date   Anxiety 08/23/2014   Chronic heart failure with preserved ejection fraction (HFpEF) (HCC)    a. 02/2000 MUGA: EF 68%; b. 06/2005 Echo: EF 55-65%; c. 06/2017 Echo: EF 60-65%; d. 08/2020 Echo: EF 60-65%, no rwma, nl RV fxn. Mild BAE. Mild MR.   Current moderate episode of major depressive disorder without prior episode (HCC) 12/31/2017   Dependent edema 10/05/2011   DNR (do not resuscitate) 12/06/2022   GERD (gastroesophageal reflux disease) 12/22/1999   Formatting of this note might be different from the original. Controlled by OTC meds   History of breast cancer 10/05/2011   Hypertension 10/05/2011   Long term current use of anticoagulant therapy 08/29/2014   Mild persistent asthma without complication 09/06/2018   Morbid obesity with BMI of 60.0-69.9, adult (HCC) 09/06/2018   Non-obstructive CAD (coronary artery disease)    a. 12/2017 Lexiscan PET/CT: mid anterior, apical lateral, and apical ischemia; b. 01/2018 Cath: LM nl, LAD & LCX 10-30% diff dzs throughout, RCA min irregs (<10%)-->Med rx.   Obstructive sleep apnea, adult 09/06/2018   Paroxysmal atrial fibrillation (HCC) 04/21/2019   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/2014   Type 2 diabetes mellitus without complication, without long-term current use of insulin (HCC) 04/21/2019   Urinary bladder incontinence 06/13/2013    ROS- All systems are reviewed and negative except as per the HPI above.  Physical Exam: Vitals:   07/14/23 1054  BP: 124/74  Pulse: 89  Weight: (!) 168.6 kg  Height: 5\' 5"  (1.651  m)    GEN: Well nourished, well developed in no acute distress NECK: No JVD; No carotid bruits CARDIAC: Regular rate and rhythm, no murmurs, rubs, gallops RESPIRATORY:  Clear to auscultation without rales, wheezing or rhonchi  ABDOMEN: Soft, non-tender,  non-distended EXTREMITIES:  No edema; No deformity    Wt Readings from Last 3 Encounters:  07/14/23 (!) 168.6 kg  05/18/23 (!) 161 kg  05/05/23 (!) 163.3 kg    EKG today demonstrates  SR Vent. rate 89 BPM PR interval 140 ms QRS duration 78 ms QT/QTcB 386/469 ms  Echo 01/05/23 demonstrated   1. Left ventricular ejection fraction, by estimation, is 60 to 65%. The  left ventricle has normal function. Left ventricular endocardial border  not optimally defined to evaluate regional wall motion. The left  ventricular internal cavity size was mildly dilated. There is mild left ventricular hypertrophy. Left ventricular diastolic parameters were normal.   2. Right ventricular systolic function is normal. The right ventricular  size is mildly enlarged. There is moderately elevated pulmonary artery  systolic pressure.   3. Right atrial size was mildly dilated.   4. The mitral valve is grossly normal. Trivial mitral valve  regurgitation.   5. The aortic valve was not well visualized. Aortic valve regurgitation  is not visualized. No aortic stenosis is present.   6. The inferior vena cava is dilated in size with <50% respiratory  variability, suggesting right atrial pressure of 15 mmHg.   Epic records are reviewed at length today  CHA2DS2-VASc Score = 5  The patient's score is based upon: CHF History: 1 HTN History: 1 Diabetes History: 1 Stroke History: 0 Vascular Disease History: 1 Age Score: 0 Gender Score: 1       ASSESSMENT AND PLAN: Paroxysmal Atrial Fibrillation (ICD10:  I48.0) The patient's CHA2DS2-VASc score is 5, indicating a 7.2% annual risk of stroke.   S/p dofetilide admission 3/4-02/25/23 Patient appears to be maintaining SR Continue dofetilide 500 mcg BID, QT stable Check bmet/mag today Continue Eliquis 5 mg BID Continue diltiazem 180 mg daily  Secondary Hypercoagulable State (ICD10:  D68.69) The patient is at significant risk for stroke/thromboembolism based  upon her CHA2DS2-VASc Score of 5.  Continue Apixaban (Eliquis).   Obesity Body mass index is 61.84 kg/m.  Encouraged lifestyle modification  OSA  Encouraged nightly CPAP Followed by Dr Craige Cotta  Chronic HFpEF Fluid status appears stable  HTN Stable on current regimen   Follow up with Dr Lalla Brothers in 3 months.    Jorja Loa PA-C [facility] [address] [place], [place] [address] [phone] 07/14/2023 11:48 AM

## 2023-07-22 ENCOUNTER — Ambulatory Visit: Payer: 59 | Admitting: Cardiology

## 2023-07-25 DIAGNOSIS — G4733 Obstructive sleep apnea (adult) (pediatric): Secondary | ICD-10-CM | POA: Diagnosis not present

## 2023-08-04 ENCOUNTER — Other Ambulatory Visit (HOSPITAL_COMMUNITY): Payer: Self-pay | Admitting: Physician Assistant

## 2023-08-05 ENCOUNTER — Ambulatory Visit: Payer: 59 | Admitting: Orthopaedic Surgery

## 2023-08-05 ENCOUNTER — Encounter: Payer: Self-pay | Admitting: Orthopaedic Surgery

## 2023-08-05 DIAGNOSIS — M25562 Pain in left knee: Secondary | ICD-10-CM

## 2023-08-05 DIAGNOSIS — Z6841 Body Mass Index (BMI) 40.0 and over, adult: Secondary | ICD-10-CM

## 2023-08-05 DIAGNOSIS — M1711 Unilateral primary osteoarthritis, right knee: Secondary | ICD-10-CM

## 2023-08-05 DIAGNOSIS — M1712 Unilateral primary osteoarthritis, left knee: Secondary | ICD-10-CM

## 2023-08-05 DIAGNOSIS — G8929 Other chronic pain: Secondary | ICD-10-CM | POA: Diagnosis not present

## 2023-08-05 DIAGNOSIS — M25561 Pain in right knee: Secondary | ICD-10-CM | POA: Diagnosis not present

## 2023-08-05 MED ORDER — METHYLPREDNISOLONE ACETATE 40 MG/ML IJ SUSP
40.0000 mg | INTRAMUSCULAR | Status: AC | PRN
Start: 2023-08-05 — End: 2023-08-05
  Administered 2023-08-05: 40 mg via INTRA_ARTICULAR

## 2023-08-05 MED ORDER — LIDOCAINE HCL 1 % IJ SOLN
3.0000 mL | INTRAMUSCULAR | Status: AC | PRN
Start: 2023-08-05 — End: 2023-08-05
  Administered 2023-08-05: 3 mL

## 2023-08-05 NOTE — Progress Notes (Signed)
The patient is well-known to me.  She is a 62 year old female with bilateral knee significant osteoarthritis which is really actually quite severe.  She has multiple comorbidities that preclude her from have any type of surgical intervention.  Her weight is gone up to 371 pounds.  She said the steroid injections take a week or 2 before they help and that it only last for couple months.  She would like to have steroid injections again in her knees today.  Both knees have significant obesity and a large soft tissue envelope.  I was able to place a steroid injection in both knees today.  She knows to wait at least 3 months before her next injections.      Procedure Note  Patient: [name]             Date of Birth: 10-24-1961           MRN: [id number]             Visit Date: 08/05/2023  Procedures: Visit Diagnoses:  1. Chronic pain of right knee   2. Chronic pain of left knee   3. Unilateral primary osteoarthritis, right knee   4. Unilateral primary osteoarthritis, left knee   5. Morbid obesity with BMI of 60.0-69.9, adult (HCC)     Large Joint Inj: R knee on 08/05/2023 3:16 PM Indications: diagnostic evaluation and pain Details: 22 G 1.5 in needle, superolateral approach  Arthrogram: No  Medications: 3 mL lidocaine 1 %; 40 mg methylPREDNISolone acetate 40 MG/ML Outcome: tolerated well, no immediate complications Procedure, treatment alternatives, risks and benefits explained, specific risks discussed. Consent was given by the patient. Immediately prior to procedure a time out was called to verify the correct patient, procedure, equipment, support staff and site/side marked as required. Patient was prepped and draped in the usual sterile fashion.    Large Joint Inj: L knee on 08/05/2023 3:16 PM Indications: diagnostic evaluation and pain Details: 22 G 1.5 in needle, superolateral approach  Arthrogram: No  Medications: 3 mL lidocaine 1 %; 40 mg methylPREDNISolone acetate 40  MG/ML Outcome: tolerated well, no immediate complications Procedure, treatment alternatives, risks and benefits explained, specific risks discussed. Consent was given by the patient. Immediately prior to procedure a time out was called to verify the correct patient, procedure, equipment, support staff and site/side marked as required. Patient was prepped and draped in the usual sterile fashion.

## 2023-08-12 DIAGNOSIS — K219 Gastro-esophageal reflux disease without esophagitis: Secondary | ICD-10-CM | POA: Diagnosis not present

## 2023-08-12 DIAGNOSIS — E1122 Type 2 diabetes mellitus with diabetic chronic kidney disease: Secondary | ICD-10-CM | POA: Diagnosis not present

## 2023-08-12 DIAGNOSIS — I2510 Atherosclerotic heart disease of native coronary artery without angina pectoris: Secondary | ICD-10-CM | POA: Diagnosis not present

## 2023-08-12 DIAGNOSIS — Z79899 Other long term (current) drug therapy: Secondary | ICD-10-CM | POA: Diagnosis not present

## 2023-08-12 DIAGNOSIS — M1990 Unspecified osteoarthritis, unspecified site: Secondary | ICD-10-CM | POA: Diagnosis not present

## 2023-08-12 DIAGNOSIS — E039 Hypothyroidism, unspecified: Secondary | ICD-10-CM | POA: Diagnosis not present

## 2023-08-12 DIAGNOSIS — Z7901 Long term (current) use of anticoagulants: Secondary | ICD-10-CM | POA: Diagnosis not present

## 2023-08-12 DIAGNOSIS — D6869 Other thrombophilia: Secondary | ICD-10-CM | POA: Diagnosis not present

## 2023-08-12 DIAGNOSIS — I130 Hypertensive heart and chronic kidney disease with heart failure and stage 1 through stage 4 chronic kidney disease, or unspecified chronic kidney disease: Secondary | ICD-10-CM | POA: Diagnosis not present

## 2023-08-12 DIAGNOSIS — I4891 Unspecified atrial fibrillation: Secondary | ICD-10-CM | POA: Diagnosis not present

## 2023-08-12 DIAGNOSIS — Z87891 Personal history of nicotine dependence: Secondary | ICD-10-CM | POA: Diagnosis not present

## 2023-08-12 DIAGNOSIS — I509 Heart failure, unspecified: Secondary | ICD-10-CM | POA: Diagnosis not present

## 2023-08-24 ENCOUNTER — Encounter (HOSPITAL_BASED_OUTPATIENT_CLINIC_OR_DEPARTMENT_OTHER): Payer: Self-pay | Admitting: Pulmonary Disease

## 2023-08-24 ENCOUNTER — Ambulatory Visit (INDEPENDENT_AMBULATORY_CARE_PROVIDER_SITE_OTHER): Payer: 59 | Admitting: Pulmonary Disease

## 2023-08-24 VITALS — BP 126/70 | HR 76 | Resp 16

## 2023-08-24 DIAGNOSIS — F5105 Insomnia due to other mental disorder: Secondary | ICD-10-CM

## 2023-08-24 DIAGNOSIS — G4733 Obstructive sleep apnea (adult) (pediatric): Secondary | ICD-10-CM

## 2023-08-24 DIAGNOSIS — F418 Other specified anxiety disorders: Secondary | ICD-10-CM

## 2023-08-24 NOTE — Progress Notes (Signed)
Pulmonary, Critical Care, and Sleep Medicine  Chief Complaint  Patient presents with   Follow-up    OSA. Doing very well.    Past Surgical History:  She  has a past surgical history that includes Mastectomy (Right); Finger surgery (Right); Cardiac catheterization (02/16/2018); and Colonoscopy with propofol (N/A, 11/28/2021).  Past Medical History:  Asthma, A fib, Breast Cancer, GERD, HTN, DM, Diastolic CHF, PAF  Constitutional:  BP 126/70   Pulse 76   Resp 16   SpO2 96%   Brief Summary:  [name] is a 62 y.o. female former smoker with obstructive sleep apnea.      Subjective:   She is here with her husband.  New CPAP is working well except her water reservoir doesn't seal properly.  It sucks in dust and cat hair when she uses it sometimes.    She is scheduled to see a trauma specialist to help with her anxiety, depression and PTSD.  She still has trouble falling asleep.  She gets terrible visions when she closes her eyes, and these only go away when she opens her eyes.  Once she finally goes to sleep, then she will sometimes get very vivid dreams.    Physical Exam:   Appearance - well kempt, sitting in a wheelchair  ENMT - no sinus tenderness, no oral exudate, no LAN, Mallampati 3 airway, no stridor, triangular uvula  Respiratory - equal breath sounds bilaterally, no wheezing or rales  CV - s1s2 regular rate and rhythm, no murmurs  Ext - no clubbing, no edema  Skin - no rashes  Psych - normal mood and affect   Pulmonary testing:  Spirometry 09/06/18 >> FEV1 2.20 (82%), FEV1% 79  Sleep Tests:  PSG 09/30/18 >> AHI 76, SpO2 low 72% Auto CPAP 05/22/23 to 08/19/23 >> used on 87 of 90 nights with average 9 hrs 14 min.  Average AHI 0.7 with median CPAP 12 and 95 th percentile CPAP 13 cm H2O  Cardiac Tests:  Echo 08/30/20 >> EF 60 to 65%, mild LA/RA dilation, mild MR  Social History:  She  reports that she quit smoking about 39 years ago. Her smoking  use included cigarettes. She started smoking about 43 years ago. She has a 1.2 pack-year smoking history. She has never used smokeless tobacco. She reports that she does not currently use alcohol. She reports that she does not use drugs.  Family History:  Her family history includes Angina in her mother; Atrial fibrillation in her mother; Heart Problems in her father and mother; Hypertension in her father and mother; Kidney Stones in her sister.     Assessment/Plan:   Obstructive sleep apnea. - she is compliant with therapy and reports benefit from CPAP - she uses [employer] for her DME - continue auto CPAP 8 to 13 cm H2O - will have her DME check the water reservoir connection for her CPAP  Insomnia with depression and PTSD. - discusses sleep restriction, stimulus control and relaxation techniques - discussed dream rehearsal  - she has appointment coming up with a trauma specialist   Obesity. - discussed importance of weight loss  Chronic diastolic CHF, Paroxysmal atrial fibrillation. - followed by Dr. Cristal Deer End with cardiology  Time Spent Involved in Patient Care on Day of Examination:  27 minutes  Follow up:   Patient Instructions  Follow up in 1 year  Medication List:   Allergies as of 08/24/2023       Reactions   Erythromycin Diarrhea  Medication List        Accurate as of August 24, 2023  4:25 PM. If you have any questions, ask your nurse or doctor.          albuterol 108 (90 Base) MCG/ACT inhaler Commonly known as: VENTOLIN HFA Inhale 2 puffs into the lungs every 6 (six) hours as needed for wheezing or shortness of breath.   apixaban 5 MG Tabs tablet Commonly known as: ELIQUIS Take 1 tablet (5 mg total) by mouth every 12 (twelve) hours.   Cholecalciferol 125 MCG (5000 UT) Chew Chew 5,000 Units by mouth daily.   clonazePAM 0.25 MG disintegrating tablet Commonly known as: KLONOPIN Take 0.5 mg by mouth 2 (two) times daily.   diltiazem  180 MG 24 hr capsule Commonly known as: CARDIZEM CD TAKE 1 CAPSULE BY MOUTH EVERY DAY   dofetilide 500 MCG capsule Commonly known as: TIKOSYN TAKE 1 CAPSULE BY MOUTH 2 TIMES DAILY.   DULoxetine 60 MG capsule Commonly known as: CYMBALTA Take 60 mg by mouth daily.   famotidine 20 MG tablet Commonly known as: PEPCID Take 20 mg by mouth 2 (two) times daily.   fexofenadine 180 MG tablet Commonly known as: ALLEGRA Take 180 mg by mouth daily.   furosemide 40 MG tablet Commonly known as: LASIX Take 1 tablet (40 mg total) by mouth daily for 1 day.   IRON PO Take 1 tablet by mouth daily.   levothyroxine 50 MCG tablet Commonly known as: SYNTHROID Take 50 mcg by mouth daily before breakfast.   lisinopril 10 MG tablet Commonly known as: ZESTRIL Take 1 tablet (10 mg total) by mouth daily.   magnesium oxide 400 (240 Mg) MG tablet Commonly known as: MAG-OX TAKE 1 TABLET BY MOUTH TWICE A DAY   magnesium oxide 400 MG tablet Commonly known as: MAG-OX Take 1 tablet (400 mg total) by mouth 2 (two) times daily.   metFORMIN 500 MG tablet Commonly known as: GLUCOPHAGE Take 1,000 mg by mouth 2 (two) times daily.   montelukast 10 MG tablet Commonly known as: SINGULAIR Take 10 mg by mouth at bedtime.   multivitamin with minerals Tabs tablet Take 1 tablet by mouth daily.   PATADAY OP Place 1 drop into both eyes daily as needed (itchy eyes).   potassium chloride SA 20 MEQ tablet Commonly known as: KLOR-CON M Take 1 tablet (20 mEq total) by mouth daily.        Signature:  Coralyn Helling, MD [facility] Pulmonary/Critical Care Pager - [phone] 08/24/2023, 4:25 PM

## 2023-08-24 NOTE — Patient Instructions (Signed)
Follow up in 1 year.

## 2023-08-25 DIAGNOSIS — G4733 Obstructive sleep apnea (adult) (pediatric): Secondary | ICD-10-CM | POA: Diagnosis not present

## 2023-08-26 DIAGNOSIS — G4733 Obstructive sleep apnea (adult) (pediatric): Secondary | ICD-10-CM | POA: Diagnosis not present

## 2023-08-30 DIAGNOSIS — F411 Generalized anxiety disorder: Secondary | ICD-10-CM | POA: Diagnosis not present

## 2023-09-10 ENCOUNTER — Ambulatory Visit (HOSPITAL_BASED_OUTPATIENT_CLINIC_OR_DEPARTMENT_OTHER): Payer: 59 | Admitting: Pulmonary Disease

## 2023-09-16 DIAGNOSIS — F411 Generalized anxiety disorder: Secondary | ICD-10-CM | POA: Diagnosis not present

## 2023-09-20 ENCOUNTER — Other Ambulatory Visit: Payer: Self-pay | Admitting: Nurse Practitioner

## 2023-09-20 NOTE — Telephone Encounter (Signed)
Prescription refill request for Eliquis received. Indication: PAF Last office visit: 07/14/23 C Fenton PA Scr: 0.56 on 07/14/23  Epic Age: 62 Weight: 168.6kg  Based on above findings Eliquis 5mg  twice daily is the appropriate dose.  Refill approved.

## 2023-09-21 DIAGNOSIS — F411 Generalized anxiety disorder: Secondary | ICD-10-CM | POA: Diagnosis not present

## 2023-09-24 DIAGNOSIS — G4733 Obstructive sleep apnea (adult) (pediatric): Secondary | ICD-10-CM | POA: Diagnosis not present

## 2023-09-28 DIAGNOSIS — F411 Generalized anxiety disorder: Secondary | ICD-10-CM | POA: Diagnosis not present

## 2023-10-02 ENCOUNTER — Other Ambulatory Visit (HOSPITAL_COMMUNITY): Payer: Self-pay | Admitting: Physician Assistant

## 2023-10-05 ENCOUNTER — Encounter: Payer: Self-pay | Admitting: Physician Assistant

## 2023-10-05 ENCOUNTER — Ambulatory Visit (INDEPENDENT_AMBULATORY_CARE_PROVIDER_SITE_OTHER): Payer: 59 | Admitting: Physician Assistant

## 2023-10-05 VITALS — BP 177/90 | HR 91 | Ht 65.0 in | Wt 364.0 lb

## 2023-10-05 DIAGNOSIS — F4310 Post-traumatic stress disorder, unspecified: Secondary | ICD-10-CM

## 2023-10-05 DIAGNOSIS — F5105 Insomnia due to other mental disorder: Secondary | ICD-10-CM | POA: Diagnosis not present

## 2023-10-05 DIAGNOSIS — F331 Major depressive disorder, recurrent, moderate: Secondary | ICD-10-CM | POA: Diagnosis not present

## 2023-10-05 DIAGNOSIS — E66813 Obesity, class 3: Secondary | ICD-10-CM | POA: Diagnosis not present

## 2023-10-05 DIAGNOSIS — F411 Generalized anxiety disorder: Secondary | ICD-10-CM

## 2023-10-05 DIAGNOSIS — F99 Mental disorder, not otherwise specified: Secondary | ICD-10-CM

## 2023-10-05 DIAGNOSIS — G4733 Obstructive sleep apnea (adult) (pediatric): Secondary | ICD-10-CM | POA: Diagnosis not present

## 2023-10-05 MED ORDER — CLONAZEPAM 0.5 MG PO TABS: 0.2500 mg | ORAL_TABLET | Freq: Three times a day (TID) | ORAL | 0 refills | Status: DC | PRN

## 2023-10-05 MED ORDER — DULOXETINE HCL 20 MG PO CPEP: 40.0000 mg | ORAL_CAPSULE | Freq: Every day | ORAL | 0 refills | Status: DC
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
## 2023-10-12 DIAGNOSIS — F411 Generalized anxiety disorder: Secondary | ICD-10-CM | POA: Diagnosis not present

## 2023-10-18 DIAGNOSIS — F411 Generalized anxiety disorder: Secondary | ICD-10-CM | POA: Diagnosis not present

## 2023-10-19 NOTE — Progress Notes (Unsigned)
Electrophysiology Office Follow up Visit Note:    Date:  10/20/2023   ID:  [name], DOB 12-12-1961, MRN [id number]  PCP:  Dois Davenport, MD  [facility] Cardiologist:  Yvonne Kendall, MD  [facility] Electrophysiologist:  Lanier Prude, MD    Interval History:     [name] is a 62 y.o. female who presents for a follow up visit.   Discussed the use of AI scribe software for clinical note transcription with the patient, who gave verbal consent to proceed.  History of Present Illness   [name], with a history of persistent atrial fibrillation, presents for follow-up. She was started on Tikosyn in March of twenty twenty four and has been taking Eliquis for stroke prophylaxis. Since her last appointment in July, she reports no interval atrial fibrillation. She also has a history of morbid obesity.            Past medical, surgical, social and family history were reviewed.  ROS:   Please see the history of present illness.    All other systems reviewed and are negative.  EKGs/Labs/Other Studies Reviewed:    The following studies were reviewed today:  EKG Interpretation Date/Time:  Wednesday October 20 2023 10:23:02 EDT Ventricular Rate:  77 PR Interval:  146 QRS Duration:  78 QT Interval:  380 QTC Calculation: 430 R Axis:   60  Text Interpretation: Normal sinus rhythm Normal ECG Confirmed by Steffanie Dunn [phone]) on 10/20/2023 10:25:23 AM    Physical Exam:    VS:  BP 130/60 (BP Location: Left Arm, Patient Position: Sitting, Cuff Size: Large)   Pulse 77   Ht 5\' 5"  (1.651 m)   Wt (!) 364 lb (165.1 kg)   SpO2 98%   BMI 60.57 kg/m     Wt Readings from Last 3 Encounters:  10/20/23 (!) 364 lb (165.1 kg)  07/14/23 (!) 371 lb 9.6 oz (168.6 kg)  05/18/23 (!) 355 lb (161 kg)     Physical Exam   GENERAL: Morbidly obese, no distress, in wheelchair. CHEST: Lungs clear to auscultation. CARDIOVASCULAR: Heart rhythm regular, no murmurs noted.           ASSESSMENT:    1. Persistent atrial fibrillation (HCC)   2. Encounter for long-term (current) use of high-risk medication   3. Morbid obesity (HCC)    PLAN:    In order of problems listed above:  Assessment and Plan    Persistent Atrial Fibrillation No reported episodes of atrial fibrillation since Tikosyn load in March 2024. EKG today shows sinus rhythm with a QTc of 430 milliseconds. -Continue Tikosyn at current dose. - continue oac -Order BMP and magnesium today.  Morbid Obesity -Encourage continued weight loss efforts.  Follow-up -Schedule follow-up appointment in 4 months with APP.               Signed, Steffanie Dunn, MD, [facility], [facility] 10/20/2023 10:28 AM    Electrophysiology [facility]

## 2023-10-20 ENCOUNTER — Ambulatory Visit: Payer: 59 | Attending: Cardiology | Admitting: Cardiology

## 2023-10-20 ENCOUNTER — Encounter: Payer: Self-pay | Admitting: Cardiology

## 2023-10-20 VITALS — BP 130/60 | HR 77 | Ht 65.0 in | Wt 364.0 lb

## 2023-10-20 DIAGNOSIS — I4819 Other persistent atrial fibrillation: Secondary | ICD-10-CM

## 2023-10-20 DIAGNOSIS — Z79899 Other long term (current) drug therapy: Secondary | ICD-10-CM

## 2023-10-20 NOTE — Patient Instructions (Signed)
Medication Instructions:  Your physician recommends that you continue on your current medications as directed. Please refer to the Current Medication list given to you today.  *If you need a refill on your cardiac medications before your next appointment, please call your pharmacy*  Lab Work: TODAY: BMET and Mag  If you have labs (blood work) drawn today and your tests are completely normal, you will receive your results only by: MyChart Message (if you have MyChart) OR A paper copy in the mail If you have any lab test that is abnormal or we need to change your treatment, we will call you to review the results.  Follow-Up: At [facility], you and your health needs are our priority.  As part of our continuing mission to provide you with exceptional heart care, we have created designated Provider Care Teams.  These Care Teams include your primary Cardiologist (physician) and Advanced Practice Providers (APPs -  Physician Assistants and Nurse Practitioners) who all work together to provide you with the care you need, when you need it.  Your next appointment:   4 months  Provider:   Sherie Don, NP

## 2023-10-21 LAB — BASIC METABOLIC PANEL
BUN/Creatinine Ratio: 22 (ref 12–28)
BUN: 13 mg/dL (ref 8–27)
CO2: 26 mmol/L (ref 20–29)
Calcium: 9.3 mg/dL (ref 8.7–10.3)
Chloride: 99 mmol/L (ref 96–106)
Creatinine, Ser: 0.58 mg/dL (ref 0.57–1.00)
Glucose: 120 mg/dL — ABNORMAL HIGH (ref 70–99)
Potassium: 5.1 mmol/L (ref 3.5–5.2)
Sodium: 141 mmol/L (ref 134–144)
eGFR: 102 mL/min/{1.73_m2} (ref >59)

## 2023-10-21 LAB — MAGNESIUM: Magnesium: 1.9 mg/dL (ref 1.6–2.3)

## 2023-10-25 DIAGNOSIS — G4733 Obstructive sleep apnea (adult) (pediatric): Secondary | ICD-10-CM | POA: Diagnosis not present

## 2023-10-29 ENCOUNTER — Other Ambulatory Visit (HOSPITAL_COMMUNITY): Payer: Self-pay | Admitting: Physician Assistant

## 2023-11-01 ENCOUNTER — Telehealth: Payer: Self-pay

## 2023-11-01 NOTE — Telephone Encounter (Signed)
Patient would like to know if she should still come to her appointment being that she is on a nebulizer and being that it's a steroid.  CB# [phone].  Please advise.  Thank you.

## 2023-11-01 NOTE — Telephone Encounter (Signed)
Patient aware of the below message from Dr. Leonard Schwartz

## 2023-11-04 ENCOUNTER — Ambulatory Visit (INDEPENDENT_AMBULATORY_CARE_PROVIDER_SITE_OTHER): Payer: 59 | Admitting: Orthopaedic Surgery

## 2023-11-04 ENCOUNTER — Encounter: Payer: Self-pay | Admitting: Orthopaedic Surgery

## 2023-11-04 DIAGNOSIS — M170 Bilateral primary osteoarthritis of knee: Secondary | ICD-10-CM

## 2023-11-04 DIAGNOSIS — M25562 Pain in left knee: Secondary | ICD-10-CM | POA: Diagnosis not present

## 2023-11-04 DIAGNOSIS — G8929 Other chronic pain: Secondary | ICD-10-CM | POA: Diagnosis not present

## 2023-11-04 DIAGNOSIS — M1712 Unilateral primary osteoarthritis, left knee: Secondary | ICD-10-CM

## 2023-11-04 DIAGNOSIS — M1711 Unilateral primary osteoarthritis, right knee: Secondary | ICD-10-CM

## 2023-11-04 DIAGNOSIS — M25561 Pain in right knee: Secondary | ICD-10-CM

## 2023-11-04 MED ORDER — LIDOCAINE HCL 1 % IJ SOLN
3.0000 mL | INTRAMUSCULAR | Status: AC | PRN
Start: 2023-11-04 — End: 2023-11-04
  Administered 2023-11-04: 3 mL

## 2023-11-04 MED ORDER — METHYLPREDNISOLONE ACETATE 40 MG/ML IJ SUSP
40.0000 mg | INTRAMUSCULAR | Status: AC | PRN
Start: 2023-11-04 — End: 2023-11-04
  Administered 2023-11-04: 40 mg via INTRA_ARTICULAR

## 2023-11-04 NOTE — Progress Notes (Signed)
The patient comes in today requesting steroid injections in both her knees.  We see her about every 3 months for steroid injections.  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  She says they last up until almost the 3 months.  She is someone who has significant multiple comorbidities including morbid obesity.  She is dealing with pneumonia right now as well.  Both knees have a large soft tissue envelope around each knee.  Both knees have pain throughout the arc of motion of the knees.  I did place a steroid injection per her request in both knees today which she tolerated well.  She is not a diabetic.  We can see her back in 3 months for repeat steroid injections in both knees.    Procedure Note  Patient: [name]             Date of Birth: May 10, 1961           MRN: [id number]             Visit Date: 11/04/2023  Procedures: Visit Diagnoses:  1. Chronic pain of right knee   2. Chronic pain of left knee   3. Unilateral primary osteoarthritis, right knee   4. Unilateral primary osteoarthritis, left knee     Large Joint Inj: R knee on 11/04/2023 3:03 PM Indications: diagnostic evaluation and pain Details: 22 G 1.5 in needle, superolateral approach  Arthrogram: No  Medications: 3 mL lidocaine 1 %; 40 mg methylPREDNISolone acetate 40 MG/ML Outcome: tolerated well, no immediate complications Procedure, treatment alternatives, risks and benefits explained, specific risks discussed. Consent was given by the patient. Immediately prior to procedure a time out was called to verify the correct patient, procedure, equipment, support staff and site/side marked as required. Patient was prepped and draped in the usual sterile fashion.    Large Joint Inj: L knee on 11/04/2023 3:03 PM Indications: diagnostic evaluation and pain Details: 22 G 1.5 in needle, superolateral approach  Arthrogram: No  Medications: 3 mL lidocaine 1 %; 40 mg methylPREDNISolone acetate 40 MG/ML Outcome: tolerated  well, no immediate complications Procedure, treatment alternatives, risks and benefits explained, specific risks discussed. Consent was given by the patient. Immediately prior to procedure a time out was called to verify the correct patient, procedure, equipment, support staff and site/side marked as required. Patient was prepped and draped in the usual sterile fashion.

## 2023-11-05 ENCOUNTER — Ambulatory Visit (INDEPENDENT_AMBULATORY_CARE_PROVIDER_SITE_OTHER): Payer: 59 | Admitting: Physician Assistant

## 2023-11-05 ENCOUNTER — Encounter: Payer: Self-pay | Admitting: Physician Assistant

## 2023-11-05 DIAGNOSIS — F5105 Insomnia due to other mental disorder: Secondary | ICD-10-CM

## 2023-11-05 DIAGNOSIS — F411 Generalized anxiety disorder: Secondary | ICD-10-CM

## 2023-11-05 DIAGNOSIS — G4733 Obstructive sleep apnea (adult) (pediatric): Secondary | ICD-10-CM

## 2023-11-05 DIAGNOSIS — F331 Major depressive disorder, recurrent, moderate: Secondary | ICD-10-CM

## 2023-11-05 DIAGNOSIS — F99 Mental disorder, not otherwise specified: Secondary | ICD-10-CM

## 2023-11-05 MED ORDER — CLONAZEPAM 0.5 MG PO TABS: 0.2500 mg | ORAL_TABLET | Freq: Three times a day (TID) | ORAL | 1 refills | Status: DC | PRN

## 2023-11-05 MED ORDER — DULOXETINE HCL 20 MG PO CPEP: 40.0000 mg | ORAL_CAPSULE | Freq: Every day | ORAL | 1 refills | Status: DC

## 2023-11-05 NOTE — Progress Notes (Unsigned)
[facility]  Patient ID: [name],  MRN: [id number]  PCP: Dois Davenport, MD  Date of Evaluation: 11/05/2023 Time spent:20 minutes  Chief Complaint:  Chief Complaint   Anxiety; Depression; Follow-up    HISTORY/CURRENT STATUS: HPI   For 1 month f/u.  We increased the Klonopin dosing at the last OV. States it's helped at least 50%. She's been really sick in the past month, had pneumonia, on inhalers, and antibiotics and even with that, she's been less anxious. Has been taking the Klonopin tid, felt like she's needed it through all this illness and SOB it's caused. Hasn't been sleeping very well,     Individual Medical History/ Review of Systems: Changes? :Yes  see HPI  Past meds include Klonopin, Xanax, Cymbalta, Zoloft had to be discontinued due to medication for A-fib, lamotrigine   Allergies: Tape and Erythromycin  Current Medications:  Current Outpatient Medications:    albuterol (PROVENTIL HFA;VENTOLIN HFA) 108 (90 Base) MCG/ACT inhaler, Inhale 2 puffs into the lungs every 6 (six) hours as needed for wheezing or shortness of breath., Disp: 1 Inhaler, Rfl: 2   apixaban (ELIQUIS) 5 MG TABS tablet, TAKE 1 TABLET BY MOUTH EVERY 12 HOURS, Disp: 60 tablet, Rfl: 5   benzonatate (TESSALON) 200 MG capsule, TAKE 1 CAPSULE BY MOUTH 3 TIMES PER DAY AS NEEDED, Disp: , Rfl:    budesonide-formoterol (SYMBICORT) 160-4.5 MCG/ACT inhaler, INHALE 1 PUFF BY MOUTH EVERY 4 HOURS AS NEEDED, Disp: , Rfl:    Cholecalciferol 125 MCG (5000 UT) CHEW, Chew 5,000 Units by mouth daily., Disp: , Rfl:    clonazePAM (KLONOPIN) 0.5 MG tablet, Take 0.5-1 tablets (0.25-0.5 mg total) by mouth 3 (three) times daily as needed for anxiety., Disp: 90 tablet, Rfl: 0   diltiazem (CARDIZEM CD) 180 MG 24 hr capsule, TAKE 1 CAPSULE BY MOUTH EVERY DAY, Disp: 30 capsule, Rfl: 3   dofetilide (TIKOSYN) 500 MCG capsule, TAKE 1 CAPSULE BY MOUTH TWICE A DAY, Disp: 60 capsule, Rfl: 5   doxycycline (VIBRAMYCIN)  100 MG capsule, TAKE 1 CAPSULE BY MOUTH EVERY 12 HOURS FOR 10 DAYS, Disp: , Rfl:    DULoxetine (CYMBALTA) 20 MG capsule, Take 2 capsules (40 mg total) by mouth daily., Disp: 60 capsule, Rfl: 0   famotidine (PEPCID) 20 MG tablet, Take 20 mg by mouth 2 (two) times daily., Disp: , Rfl:    Ferrous Sulfate (IRON PO), Take 1 tablet by mouth daily., Disp: , Rfl:    fexofenadine (ALLEGRA) 180 MG tablet, Take 180 mg by mouth daily., Disp: , Rfl:    ipratropium-albuterol (DUONEB) 0.5-2.5 (3) MG/3ML SOLN, INHALE 3 ML BY MOUTH VIA NEBULIZER EVERY 8 HOURS AS NEEDED, Disp: , Rfl:    KLOR-CON M20 20 MEQ tablet, TAKE 1 TABLET BY MOUTH EVERY DAY, Disp: 30 tablet, Rfl: 6   levothyroxine (SYNTHROID) 50 MCG tablet, Take 50 mcg by mouth daily before breakfast., Disp: , Rfl:    lisinopril (ZESTRIL) 10 MG tablet, Take 1 tablet (10 mg total) by mouth daily., Disp: 90 tablet, Rfl: 3   magnesium oxide (MAG-OX) 400 (240 Mg) MG tablet, TAKE 1 TABLET BY MOUTH TWICE A DAY, Disp: 180 tablet, Rfl: 1   metFORMIN (GLUCOPHAGE) 500 MG tablet, Take 1,000 mg by mouth 2 (two) times daily., Disp: , Rfl:    montelukast (SINGULAIR) 10 MG tablet, Take 10 mg by mouth at bedtime., Disp: , Rfl:    Multiple Vitamin (MULTIVITAMIN WITH MINERALS) TABS tablet, Take 1 tablet by mouth daily.,  Disp: , Rfl:    Olopatadine HCl (PATADAY OP), Place 1 drop into both eyes daily as needed (itchy eyes)., Disp: , Rfl:    furosemide (LASIX) 40 MG tablet, Take 1 tablet (40 mg total) by mouth daily for 1 day., Disp: 30 tablet, Rfl:  Medication Side Effects: none  Family Medical/ Social History: Changes? No  MENTAL HEALTH EXAM:  There were no vitals taken for this visit.There is no height or weight on file to calculate BMI.  General Appearance: Casual, Well Groomed, and Obese  Eye Contact:  Good  Speech:  Clear and Coherent and Normal Rate  Volume:  Normal  Mood:  Euthymic  Affect:  Congruent  Thought Process:  Goal Directed and Descriptions of  Associations: Circumstantial  Orientation:  Full (Time, Place, and Person)  Thought Content: Logical   Suicidal Thoughts:  No  Homicidal Thoughts:  No  Memory:  WNL  Judgement:  Good  Insight:  Good  Psychomotor Activity:   slow, using a cane  Concentration:  Concentration: Good  Recall:  Good  Fund of Knowledge: Good  Language: Good  Assets:  Communication Skills Desire for Improvement Financial Resources/Insurance Housing Transportation  ADL's:  Intact  Cognition: WNL  Prognosis:  Good   DIAGNOSES: No diagnosis found.  Receiving Psychotherapy: Yes  Fenton Malling, trauma specialist She saw Rockne Menghini, LCSW in the past  RECOMMENDATIONS:  PDMP reviewed.  Klonopin filled 10/05/2023. I provided 21 minutes of face to face time during this encounter, including time spent before and after the visit in records review, medical decision making, counseling pertinent to today's visit, and charting.       Melony Overly, PA-C

## 2023-11-09 DIAGNOSIS — F411 Generalized anxiety disorder: Secondary | ICD-10-CM | POA: Diagnosis not present

## 2023-11-10 ENCOUNTER — Ambulatory Visit: Payer: 59 | Admitting: Cardiology

## 2023-11-16 DIAGNOSIS — F411 Generalized anxiety disorder: Secondary | ICD-10-CM | POA: Diagnosis not present

## 2023-11-22 DIAGNOSIS — F411 Generalized anxiety disorder: Secondary | ICD-10-CM | POA: Diagnosis not present

## 2023-11-24 DIAGNOSIS — G4733 Obstructive sleep apnea (adult) (pediatric): Secondary | ICD-10-CM | POA: Diagnosis not present

## 2023-11-25 ENCOUNTER — Emergency Department (HOSPITAL_COMMUNITY): Payer: 59

## 2023-11-25 ENCOUNTER — Encounter (HOSPITAL_COMMUNITY): Payer: Self-pay | Admitting: Emergency Medicine

## 2023-11-25 ENCOUNTER — Emergency Department (HOSPITAL_COMMUNITY)
Admission: EM | Admit: 2023-11-25 | Discharge: 2023-11-26 | Disposition: A | Discharge status: Home / Self Care | Payer: 59 | Attending: Emergency Medicine | Admitting: Emergency Medicine

## 2023-11-25 DIAGNOSIS — Z7901 Long term (current) use of anticoagulants: Secondary | ICD-10-CM | POA: Insufficient documentation

## 2023-11-25 DIAGNOSIS — I517 Cardiomegaly: Secondary | ICD-10-CM | POA: Diagnosis not present

## 2023-11-25 DIAGNOSIS — R110 Nausea: Secondary | ICD-10-CM | POA: Diagnosis not present

## 2023-11-25 DIAGNOSIS — R55 Syncope and collapse: Secondary | ICD-10-CM | POA: Insufficient documentation

## 2023-11-25 DIAGNOSIS — R1110 Vomiting, unspecified: Secondary | ICD-10-CM | POA: Diagnosis not present

## 2023-11-25 DIAGNOSIS — K529 Noninfective gastroenteritis and colitis, unspecified: Secondary | ICD-10-CM | POA: Diagnosis not present

## 2023-11-25 DIAGNOSIS — Z743 Need for continuous supervision: Secondary | ICD-10-CM | POA: Diagnosis not present

## 2023-11-25 DIAGNOSIS — R0989 Other specified symptoms and signs involving the circulatory and respiratory systems: Secondary | ICD-10-CM | POA: Diagnosis not present

## 2023-11-25 DIAGNOSIS — I959 Hypotension, unspecified: Secondary | ICD-10-CM | POA: Diagnosis not present

## 2023-11-25 DIAGNOSIS — R0902 Hypoxemia: Secondary | ICD-10-CM | POA: Diagnosis not present

## 2023-11-25 LAB — I-STAT CHEM 8, ED
BUN: 11 mg/dL (ref 8–23)
BUN: 12 mg/dL (ref 8–23)
Calcium, Ion: 0.84 mmol/L — CL (ref 1.15–1.40)
Calcium, Ion: 1.02 mmol/L — ABNORMAL LOW (ref 1.15–1.40)
Chloride: 107 mmol/L (ref 98–111)
Chloride: 99 mmol/L (ref 98–111)
Creatinine, Ser: 0.6 mg/dL (ref 0.44–1.00)
Creatinine, Ser: 0.7 mg/dL (ref 0.44–1.00)
Glucose, Bld: 111 mg/dL — ABNORMAL HIGH (ref 70–99)
Glucose, Bld: 141 mg/dL — ABNORMAL HIGH (ref 70–99)
HCT: 29 % — ABNORMAL LOW (ref 36.0–46.0)
HCT: 35 % — ABNORMAL LOW (ref 36.0–46.0)
Hemoglobin: 11.9 g/dL — ABNORMAL LOW (ref 12.0–15.0)
Hemoglobin: 9.9 g/dL — ABNORMAL LOW (ref 12.0–15.0)
Potassium: 3.6 mmol/L (ref 3.5–5.1)
Potassium: 6 mmol/L — ABNORMAL HIGH (ref 3.5–5.1)
Sodium: 139 mmol/L (ref 135–145)
Sodium: 139 mmol/L (ref 135–145)
TCO2: 26 mmol/L (ref 22–32)
TCO2: 26 mmol/L (ref 22–32)

## 2023-11-25 LAB — CBC WITH DIFFERENTIAL/PLATELET
Abs Immature Granulocytes: 0.15 10*3/uL — ABNORMAL HIGH (ref 0.00–0.07)
Basophils Absolute: 0.1 10*3/uL (ref 0.0–0.1)
Basophils Relative: 1 %
Eosinophils Absolute: 0.1 10*3/uL (ref 0.0–0.5)
Eosinophils Relative: 1 %
HCT: 35.6 % — ABNORMAL LOW (ref 36.0–46.0)
Hemoglobin: 11.5 g/dL — ABNORMAL LOW (ref 12.0–15.0)
Immature Granulocytes: 1 %
Lymphocytes Relative: 16 %
Lymphs Abs: 2.2 10*3/uL (ref 0.7–4.0)
MCH: 29.3 pg (ref 26.0–34.0)
MCHC: 32.3 g/dL (ref 30.0–36.0)
MCV: 90.8 fL (ref 80.0–100.0)
Monocytes Absolute: 1 10*3/uL (ref 0.1–1.0)
Monocytes Relative: 7 %
Neutro Abs: 10.2 10*3/uL — ABNORMAL HIGH (ref 1.7–7.7)
Neutrophils Relative %: 74 %
Platelets: 227 10*3/uL (ref 150–400)
RBC: 3.92 MIL/uL (ref 3.87–5.11)
RDW: 14.6 % (ref 11.5–15.5)
WBC: 13.8 10*3/uL — ABNORMAL HIGH (ref 4.0–10.5)
nRBC: 0 % (ref 0.0–0.2)

## 2023-11-25 MED ORDER — SODIUM CHLORIDE 0.9 % IV BOLUS
1000.0000 mL | Freq: Once | INTRAVENOUS | Status: AC
  Administered 2023-11-25: 1000 mL via INTRAVENOUS

## 2023-11-25 NOTE — ED Triage Notes (Signed)
Patient BIB EMS from home. Patient c/o NVD since 2045 tonight. Patient did go out to eat for supper. Patient was found to be hypotensive 80 SBP following a syncopal episode witnessed by Fire. BGL 204; alert/oriented; HX of A-fib, on thinners

## 2023-11-26 DIAGNOSIS — G4733 Obstructive sleep apnea (adult) (pediatric): Secondary | ICD-10-CM | POA: Diagnosis not present

## 2023-11-26 LAB — BASIC METABOLIC PANEL
Anion gap: 14 (ref 5–15)
BUN: 12 mg/dL (ref 8–23)
CO2: 23 mmol/L (ref 22–32)
Calcium: 8.6 mg/dL — ABNORMAL LOW (ref 8.9–10.3)
Chloride: 100 mmol/L (ref 98–111)
Creatinine, Ser: 0.74 mg/dL (ref 0.44–1.00)
GFR, Estimated: >60 mL/min (ref >60)
Glucose, Bld: 137 mg/dL — ABNORMAL HIGH (ref 70–99)
Potassium: 3.6 mmol/L (ref 3.5–5.1)
Sodium: 137 mmol/L (ref 135–145)

## 2023-11-26 LAB — MAGNESIUM: Magnesium: 1.9 mg/dL (ref 1.7–2.4)

## 2023-11-26 LAB — TROPONIN I (HIGH SENSITIVITY)
Troponin I (High Sensitivity): 4 ng/L (ref <18)
Troponin I (High Sensitivity): 5 ng/L (ref <18)

## 2023-11-26 MED ORDER — SODIUM CHLORIDE 0.9 % IV SOLN
12.5000 mg | Freq: Four times a day (QID) | INTRAVENOUS | Status: DC | PRN
  Administered 2023-11-26: 12.5 mg via INTRAVENOUS
  Filled 2023-11-26: qty 0.5

## 2023-11-26 NOTE — Discharge Instructions (Signed)
Your workup was reassuring.  Take Phenergan as needed.  Your syncopal episode was likely from the vomiting episode.  Return for any concerning symptoms otherwise follow-up with your primary care provider.

## 2023-11-26 NOTE — ED Provider Notes (Addendum)
[place] EMERGENCY DEPARTMENT AT [facility] Provider Note   CSN: [id number] Arrival date & time: 11/25/23  2155     History  Chief Complaint  Patient presents with   Loss of Consciousness   Emesis    [name] is a 62 y.o. female.  62 year old female presents today for concern of nausea and vomiting associated with a syncopal episode after the vomiting episode.  This was witnessed episode.  Husband is at bedside.  She appears well currently.  Hemodynamically stable.  She does not have complaints currently.  No chest pain, shortness of breath.  Currently not nauseous.  She says she received Zofran with EMS but cannot take Zofran due to being on Tikosyn.  Does take Phenergan at home and has supply of Phenergan.  The history is provided by the patient. No language interpreter was used.       Home Medications Prior to Admission medications   Medication Sig Start Date End Date Taking? Authorizing Provider  albuterol (PROVENTIL HFA;VENTOLIN HFA) 108 (90 Base) MCG/ACT inhaler Inhale 2 puffs into the lungs every 6 (six) hours as needed for wheezing or shortness of breath. 04/10/17   Lucia Estelle, NP  apixaban (ELIQUIS) 5 MG TABS tablet TAKE 1 TABLET BY MOUTH EVERY 12 HOURS 09/20/23   End, Cristal Deer, MD  benzonatate (TESSALON) 200 MG capsule TAKE 1 CAPSULE BY MOUTH 3 TIMES PER DAY AS NEEDED 10/29/23   [provider]  budesonide-formoterol (SYMBICORT) 160-4.5 MCG/ACT inhaler INHALE 1 PUFF BY MOUTH EVERY 4 HOURS AS NEEDED 10/26/23   [provider]  Cholecalciferol 125 MCG (5000 UT) CHEW Chew 5,000 Units by mouth daily.    [provider]  clonazePAM (KLONOPIN) 0.5 MG tablet Take 0.5-1 tablets (0.25-0.5 mg total) by mouth 3 (three) times daily as needed for anxiety. 11/05/23   Melony Overly T, PA-C  diltiazem (CARDIZEM CD) 180 MG 24 hr capsule TAKE 1 CAPSULE BY MOUTH EVERY DAY 07/08/23   Fenton, Clint R, PA  dofetilide (TIKOSYN) 500 MCG capsule TAKE 1  CAPSULE BY MOUTH TWICE A DAY 10/29/23   Fenton, Clint R, PA  doxycycline (VIBRAMYCIN) 100 MG capsule TAKE 1 CAPSULE BY MOUTH EVERY 12 HOURS FOR 10 DAYS 10/29/23   [provider]  DULoxetine (CYMBALTA) 20 MG capsule Take 2 capsules (40 mg total) by mouth daily. 11/05/23   Melony Overly T, PA-C  famotidine (PEPCID) 20 MG tablet Take 20 mg by mouth 2 (two) times daily.    [provider]  Ferrous Sulfate (IRON PO) Take 1 tablet by mouth daily.    [provider]  fexofenadine (ALLEGRA) 180 MG tablet Take 180 mg by mouth daily.    [provider]  furosemide (LASIX) 40 MG tablet Take 1 tablet (40 mg total) by mouth daily for 1 day. 12/08/22 10/20/23  Joseph Art, DO  ipratropium-albuterol (DUONEB) 0.5-2.5 (3) MG/3ML SOLN INHALE 3 ML BY MOUTH VIA NEBULIZER EVERY 8 HOURS AS NEEDED 10/29/23   [provider]  KLOR-CON M20 20 MEQ tablet TAKE 1 TABLET BY MOUTH EVERY DAY 10/04/23   Fenton, Clint R, PA  levothyroxine (SYNTHROID) 50 MCG tablet Take 50 mcg by mouth daily before breakfast.    [provider]  lisinopril (ZESTRIL) 10 MG tablet Take 1 tablet (10 mg total) by mouth daily. 02/12/23 02/07/24  End, Cristal Deer, MD  magnesium oxide (MAG-OX) 400 (240 Mg) MG tablet TAKE 1 TABLET BY MOUTH TWICE A DAY 08/04/23   Lanier Prude,  MD  metFORMIN (GLUCOPHAGE) 500 MG tablet Take 1,000 mg by mouth 2 (two) times daily. 11/16/22   [provider]  montelukast (SINGULAIR) 10 MG tablet Take 10 mg by mouth at bedtime.    [provider]  Multiple Vitamin (MULTIVITAMIN WITH MINERALS) TABS tablet Take 1 tablet by mouth daily.    [provider]  Olopatadine HCl (PATADAY OP) Place 1 drop into both eyes daily as needed (itchy eyes).    [provider]  UNABLE TO FIND CPAP    [provider]      Allergies    Tape and Erythromycin    Review of Systems   Review of Systems  Constitutional:  Negative for chills and  fever.  Respiratory:  Negative for shortness of breath.   Cardiovascular:  Negative for chest pain.  Gastrointestinal:  Positive for nausea. Negative for abdominal pain and vomiting.  Endocrine: Negative for polyphagia.  Neurological:  Positive for syncope.  All other systems reviewed and are negative.   Physical Exam Updated Vital Signs BP 111/60   Pulse 85   Temp 97.8 F (36.6 C) (Oral)   Resp 19   Ht 5\' 5"  (1.651 m)   Wt (!) 169.4 kg   SpO2 95%   BMI 62.15 kg/m  Physical Exam Vitals and nursing note reviewed.  Constitutional:      General: She is not in acute distress.    Appearance: Normal appearance. She is not ill-appearing.  HENT:     Head: Normocephalic and atraumatic.     Nose: Nose normal.  Eyes:     Conjunctiva/sclera: Conjunctivae normal.  Cardiovascular:     Rate and Rhythm: Normal rate and regular rhythm.  Pulmonary:     Effort: Pulmonary effort is normal. No respiratory distress.  Abdominal:     General: There is no distension.     Palpations: Abdomen is soft.     Tenderness: There is no abdominal tenderness. There is no guarding.  Musculoskeletal:        General: No deformity. Normal range of motion.  Skin:    Findings: No rash.  Neurological:     Mental Status: She is alert.     ED Results / Procedures / Treatments   Labs (all labs ordered are listed, but only abnormal results are displayed) Labs Reviewed  CBC WITH DIFFERENTIAL/PLATELET - Abnormal; Notable for the following components:      Result Value   WBC 13.8 (*)    Hemoglobin 11.5 (*)    HCT 35.6 (*)    Neutro Abs 10.2 (*)    Abs Immature Granulocytes 0.15 (*)    All other components within normal limits  BASIC METABOLIC PANEL - Abnormal; Notable for the following components:   Glucose, Bld 137 (*)    Calcium 8.6 (*)    All other components within normal limits  I-STAT CHEM 8, ED - Abnormal; Notable for the following components:   Potassium 6.0 (*)    Glucose, Bld 111 (*)     Calcium, Ion 0.84 (*)    Hemoglobin 9.9 (*)    HCT 29.0 (*)    All other components within normal limits  I-STAT CHEM 8, ED - Abnormal; Notable for the following components:   Glucose, Bld 141 (*)    Calcium, Ion 1.02 (*)    Hemoglobin 11.9 (*)    HCT 35.0 (*)    All other components within normal limits  MAGNESIUM  TROPONIN I (HIGH SENSITIVITY)  TROPONIN I (  HIGH SENSITIVITY)    EKG EKG Interpretation Date/Time:  Thursday November 25 2023 22:27:11 EST Ventricular Rate:  87 PR Interval:  147 QRS Duration:  91 QT Interval:  399 QTC Calculation: 480 R Axis:   70  Text Interpretation: Sinus rhythm Abnormal R-wave progression, early transition No significant change was found Confirmed by Glynn Octave [id number]) on 11/26/2023 12:27:01 AM  Radiology DG Chest Portable 1 View  Result Date: 11/25/2023 CLINICAL DATA:  Syncope, emesis, recurrent pneumonia. EXAM: PORTABLE CHEST 1 VIEW COMPARISON:  04/26/2023. FINDINGS: Heart is enlarged and the mediastinal contour is stable. Lung volumes are low. No consolidation, effusion, or pneumothorax is seen. Surgical clips are present in the right axilla. No acute osseous abnormality. IMPRESSION: No active disease. Electronically Signed   By: Thornell Sartorius M.D.   On: 11/25/2023 23:17    Procedures Procedures    Medications Ordered in ED Medications  promethazine (PHENERGAN) 12.5 mg in sodium chloride 0.9 % 50 mL IVPB (12.5 mg Intravenous New Bag/Given 11/26/23 0332)  sodium chloride 0.9 % bolus 1,000 mL (0 mLs Intravenous Stopped 11/26/23 0015)    ED Course/ Medical Decision Making/ A&P                                 Medical Decision Making Amount and/or Complexity of Data Reviewed Labs: ordered. Radiology: ordered.   62 year old female presents today for concern of nausea and vomiting as well as an accompanying syncopal episode.  No palpitations, chest pain, shortness of breath.  My attending evaluated the patient as well into him she  complained of chest pain.  Troponin was added to workup.  CBC with mild leukocytosis.  No left shift.  Hemoglobin mildly decreased.  No signs of bleeding.  BMP without acute concern.  Magnesium 1.9.  Troponin negative.  Chest x-ray without acute cardiopulmonary process.  EKG without acute ischemic change.  Does endorse recent pneumonia but states that symptoms have completely resolved and she completed the antibiotic course.  She received some fluids in the emergency department.  Dose of Phenergan given.  She is appropriate for discharge.  Discharged in stable condition. Likely vasovagal syncope after the emesis episode.  Final Clinical Impression(s) / ED Diagnoses Final diagnoses:  Syncope and collapse  Gastroenteritis    Rx / DC Orders ED Discharge Orders     None         Marita Kansas, PA-C 11/26/23 0416    Marita Kansas, PA-C 11/26/23 [id number]    Glynn Octave, MD 11/26/23 [phone]

## 2023-12-01 DIAGNOSIS — F411 Generalized anxiety disorder: Secondary | ICD-10-CM | POA: Diagnosis not present

## 2023-12-07 DIAGNOSIS — F411 Generalized anxiety disorder: Secondary | ICD-10-CM | POA: Diagnosis not present

## 2023-12-21 DIAGNOSIS — F411 Generalized anxiety disorder: Secondary | ICD-10-CM | POA: Diagnosis not present

## 2023-12-25 DIAGNOSIS — G4733 Obstructive sleep apnea (adult) (pediatric): Secondary | ICD-10-CM | POA: Diagnosis not present

## 2023-12-28 DIAGNOSIS — F411 Generalized anxiety disorder: Secondary | ICD-10-CM | POA: Diagnosis not present

## 2023-12-30 DIAGNOSIS — E559 Vitamin D deficiency, unspecified: Secondary | ICD-10-CM | POA: Diagnosis not present

## 2023-12-30 DIAGNOSIS — R7301 Impaired fasting glucose: Secondary | ICD-10-CM | POA: Diagnosis not present

## 2023-12-30 DIAGNOSIS — R000 Tachycardia, unspecified: Secondary | ICD-10-CM | POA: Diagnosis not present

## 2023-12-30 DIAGNOSIS — E611 Iron deficiency: Secondary | ICD-10-CM | POA: Diagnosis not present

## 2023-12-30 DIAGNOSIS — I10 Essential (primary) hypertension: Secondary | ICD-10-CM | POA: Diagnosis not present

## 2023-12-30 DIAGNOSIS — Z0001 Encounter for general adult medical examination with abnormal findings: Secondary | ICD-10-CM | POA: Diagnosis not present

## 2023-12-30 DIAGNOSIS — R5383 Other fatigue: Secondary | ICD-10-CM | POA: Diagnosis not present

## 2023-12-30 DIAGNOSIS — E039 Hypothyroidism, unspecified: Secondary | ICD-10-CM | POA: Diagnosis not present

## 2023-12-30 LAB — CBC AND DIFFERENTIAL
HCT: 38 (ref 36–46)
Hemoglobin: 12.4 (ref 12.0–16.0)
WBC: 10.6

## 2023-12-30 LAB — BASIC METABOLIC PANEL
BUN: 10 (ref 4–21)
Creatinine: 0.5 (ref 0.5–1.1)
Glucose: 111
Potassium: 4.4 meq/L (ref 3.5–5.1)
Sodium: 138 (ref 137–147)

## 2023-12-30 LAB — COMPREHENSIVE METABOLIC PANEL: eGFR: 105

## 2023-12-30 LAB — LIPID PANEL
A1c: 6.4
Cholesterol: 172 (ref 0–200)
EGFR: 105
HDL: 48 (ref 35–70)
LDL Cholesterol: 102
Triglycerides: 128 (ref 40–160)

## 2023-12-30 LAB — TSH: TSH: 2.42 (ref 0.41–5.90)

## 2023-12-30 LAB — CBC: RBC: 4.28 (ref 3.87–5.11)

## 2024-01-05 ENCOUNTER — Ambulatory Visit (INDEPENDENT_AMBULATORY_CARE_PROVIDER_SITE_OTHER): Payer: 59 | Admitting: Physician Assistant

## 2024-01-05 ENCOUNTER — Encounter: Payer: Self-pay | Admitting: Physician Assistant

## 2024-01-05 DIAGNOSIS — G4733 Obstructive sleep apnea (adult) (pediatric): Secondary | ICD-10-CM | POA: Diagnosis not present

## 2024-01-05 DIAGNOSIS — F411 Generalized anxiety disorder: Secondary | ICD-10-CM

## 2024-01-05 DIAGNOSIS — E66813 Obesity, class 3: Secondary | ICD-10-CM | POA: Diagnosis not present

## 2024-01-05 DIAGNOSIS — Z630 Problems in relationship with spouse or partner: Secondary | ICD-10-CM

## 2024-01-05 DIAGNOSIS — F331 Major depressive disorder, recurrent, moderate: Secondary | ICD-10-CM | POA: Diagnosis not present

## 2024-01-05 MED ORDER — CLONAZEPAM 0.5 MG PO TABS: 0.2500 mg | ORAL_TABLET | Freq: Three times a day (TID) | ORAL | 1 refills | Status: DC | PRN

## 2024-01-05 MED ORDER — DULOXETINE HCL 60 MG PO CPEP: 60.0000 mg | ORAL_CAPSULE | Freq: Every day | ORAL | 1 refills | Status: DC

## 2024-01-05 NOTE — Progress Notes (Signed)
[facility]  Patient ID: [name],  MRN: [id number]  PCP: Dois Davenport, MD  Date of Evaluation: 01/05/2024 Time spent:20 minutes  Chief Complaint:  Chief Complaint   Anxiety; Depression; Follow-up    HISTORY/CURRENT STATUS: HPI   For routine 43-month med check  [name] states she is not doing well.  She is dealing with some marital problems, "deception and betrayal" but does not go into it any further.  She has an appointment with her therapist this afternoon.  She feels really sad, anhedonia, ADLs and personal hygiene are normal.  Appetite is normal and weight is stable.  She cries easily.  Feels hopeless at times.  Sleeps fairly well.  Denies suicidal or homicidal thoughts.  With the things that she is going through in her marriage she has been more anxious.  Not having panic attacks as much as feeling overwhelmed, like waiting for something else bad to happen.  Klonopin does help when she takes it.  Patient denies increased energy with decreased need for sleep, increased talkativeness, racing thoughts, impulsivity or risky behaviors, increased spending, increased libido, grandiosity, increased irritability or anger, paranoia, or hallucinations.  Denies dizziness, syncope, seizures, numbness, tingling, tremor, tics, unsteady gait, slurred speech, confusion. Denies muscle or joint pain, stiffness, or dystonia.  Individual Medical History/ Review of Systems: Changes? :No     Past meds include Klonopin, Xanax, Cymbalta, Zoloft had to be discontinued due to medication for A-fib, lamotrigine   Allergies: Tape and Erythromycin  Current Medications:  Current Outpatient Medications:    albuterol (PROVENTIL HFA;VENTOLIN HFA) 108 (90 Base) MCG/ACT inhaler, Inhale 2 puffs into the lungs every 6 (six) hours as needed for wheezing or shortness of breath., Disp: 1 Inhaler, Rfl: 2   apixaban (ELIQUIS) 5 MG TABS tablet, TAKE 1 TABLET BY MOUTH EVERY 12 HOURS, Disp: 60 tablet, Rfl:  5   budesonide-formoterol (SYMBICORT) 160-4.5 MCG/ACT inhaler, INHALE 1 PUFF BY MOUTH EVERY 4 HOURS AS NEEDED, Disp: , Rfl:    Cholecalciferol 125 MCG (5000 UT) CHEW, Chew 5,000 Units by mouth daily., Disp: , Rfl:    diltiazem (CARDIZEM CD) 180 MG 24 hr capsule, TAKE 1 CAPSULE BY MOUTH EVERY DAY, Disp: 30 capsule, Rfl: 3   dofetilide (TIKOSYN) 500 MCG capsule, TAKE 1 CAPSULE BY MOUTH TWICE A DAY, Disp: 60 capsule, Rfl: 5   DULoxetine (CYMBALTA) 60 MG capsule, Take 1 capsule (60 mg total) by mouth daily., Disp: 30 capsule, Rfl: 1   famotidine (PEPCID) 20 MG tablet, Take 20 mg by mouth 2 (two) times daily., Disp: , Rfl:    Ferrous Sulfate (IRON PO), Take 1 tablet by mouth daily., Disp: , Rfl:    fexofenadine (ALLEGRA) 180 MG tablet, Take 180 mg by mouth daily., Disp: , Rfl:    KLOR-CON M20 20 MEQ tablet, TAKE 1 TABLET BY MOUTH EVERY DAY, Disp: 30 tablet, Rfl: 6   levothyroxine (SYNTHROID) 50 MCG tablet, Take 50 mcg by mouth daily before breakfast., Disp: , Rfl:    lisinopril (ZESTRIL) 10 MG tablet, Take 1 tablet (10 mg total) by mouth daily., Disp: 90 tablet, Rfl: 3   magnesium oxide (MAG-OX) 400 (240 Mg) MG tablet, TAKE 1 TABLET BY MOUTH TWICE A DAY, Disp: 180 tablet, Rfl: 1   metFORMIN (GLUCOPHAGE) 500 MG tablet, Take 1,000 mg by mouth 2 (two) times daily., Disp: , Rfl:    montelukast (SINGULAIR) 10 MG tablet, Take 10 mg by mouth at bedtime., Disp: , Rfl:    Multiple Vitamin (  MULTIVITAMIN WITH MINERALS) TABS tablet, Take 1 tablet by mouth daily., Disp: , Rfl:    Olopatadine HCl (PATADAY OP), Place 1 drop into both eyes daily as needed (itchy eyes)., Disp: , Rfl:    UNABLE TO FIND, CPAP, Disp: , Rfl:    clonazePAM (KLONOPIN) 0.5 MG tablet, Take 0.5-1 tablets (0.25-0.5 mg total) by mouth 3 (three) times daily as needed for anxiety., Disp: 90 tablet, Rfl: 1   furosemide (LASIX) 40 MG tablet, Take 1 tablet (40 mg total) by mouth daily for 1 day., Disp: 30 tablet, Rfl:    ipratropium-albuterol  (DUONEB) 0.5-2.5 (3) MG/3ML SOLN, INHALE 3 ML BY MOUTH VIA NEBULIZER EVERY 8 HOURS AS NEEDED (Patient not taking: Reported on 01/05/2024), Disp: , Rfl:  Medication Side Effects: none  Family Medical/ Social History: Changes? No  MENTAL HEALTH EXAM:  There were no vitals taken for this visit.There is no height or weight on file to calculate BMI.  General Appearance: Casual, Well Groomed, and Obese  Eye Contact:  Good  Speech:  Clear and Coherent and Normal Rate  Volume:  Normal  Mood:  Depressed  Affect:  Depressed  Thought Process:  Goal Directed and Descriptions of Associations: Circumstantial  Orientation:  Full (Time, Place, and Person)  Thought Content: Logical   Suicidal Thoughts:  No  Homicidal Thoughts:  No  Memory:  WNL  Judgement:  Good  Insight:  Good  Psychomotor Activity:   slow, using a cane  Concentration:  Concentration: Good  Recall:  Good  Fund of Knowledge: Good  Language: Good  Assets:  Communication Skills Desire for Improvement Financial Resources/Insurance Housing Transportation  ADL's:  Intact  Cognition: WNL  Prognosis:  Good   DIAGNOSES:    ICD-10-CM   1. Major depressive disorder, recurrent episode, moderate (HCC)  F33.1     2. Generalized anxiety disorder  F41.1     3. Marital stress  Z63.0     4. Obstructive sleep apnea  G47.33     5. Class 3 severe obesity due to excess calories with serious comorbidity in adult, unspecified BMI [facility])  Z61.096    E66.01       Receiving Psychotherapy: Yes  Fenton Malling, trauma specialist She saw Rockne Menghini, LCSW in the past  RECOMMENDATIONS:  PDMP reviewed.  Klonopin filled 12/25/2023. I provided  20 minutes of face to face time during this encounter, including time spent before and after the visit in records review, medical decision making, counseling pertinent to today's visit, and charting.   Recommend increasing the antidepressant.  The situation is circumstantial but I still think bumping  up the dose will help.  Continue Klonopin 0.5 mg, 1/2-1 p.o. 3 times daily as needed anxiety. Increase Cymbalta to 60 mg daily. Continue therapy with Patrecia Pour. Return in 6 weeks.  Melony Overly, PA-C

## 2024-01-13 DIAGNOSIS — F411 Generalized anxiety disorder: Secondary | ICD-10-CM | POA: Diagnosis not present

## 2024-01-18 DIAGNOSIS — F411 Generalized anxiety disorder: Secondary | ICD-10-CM | POA: Diagnosis not present

## 2024-01-19 ENCOUNTER — Telehealth (HOSPITAL_COMMUNITY): Payer: Self-pay

## 2024-01-19 NOTE — Telephone Encounter (Signed)
[facility] has approved Dofetilide 500 mg capsule from 01/19/2024-01/18/2025.

## 2024-01-21 DIAGNOSIS — Z7901 Long term (current) use of anticoagulants: Secondary | ICD-10-CM | POA: Diagnosis not present

## 2024-01-21 DIAGNOSIS — D6869 Other thrombophilia: Secondary | ICD-10-CM | POA: Diagnosis not present

## 2024-01-21 DIAGNOSIS — M1990 Unspecified osteoarthritis, unspecified site: Secondary | ICD-10-CM | POA: Diagnosis not present

## 2024-01-21 DIAGNOSIS — Z87891 Personal history of nicotine dependence: Secondary | ICD-10-CM | POA: Diagnosis not present

## 2024-01-21 DIAGNOSIS — E1122 Type 2 diabetes mellitus with diabetic chronic kidney disease: Secondary | ICD-10-CM | POA: Diagnosis not present

## 2024-01-21 DIAGNOSIS — E039 Hypothyroidism, unspecified: Secondary | ICD-10-CM | POA: Diagnosis not present

## 2024-01-21 DIAGNOSIS — Z7984 Long term (current) use of oral hypoglycemic drugs: Secondary | ICD-10-CM | POA: Diagnosis not present

## 2024-01-21 DIAGNOSIS — I4891 Unspecified atrial fibrillation: Secondary | ICD-10-CM | POA: Diagnosis not present

## 2024-01-21 DIAGNOSIS — K219 Gastro-esophageal reflux disease without esophagitis: Secondary | ICD-10-CM | POA: Diagnosis not present

## 2024-01-21 DIAGNOSIS — Z8249 Family history of ischemic heart disease and other diseases of the circulatory system: Secondary | ICD-10-CM | POA: Diagnosis not present

## 2024-01-21 DIAGNOSIS — Z833 Family history of diabetes mellitus: Secondary | ICD-10-CM | POA: Diagnosis not present

## 2024-01-21 DIAGNOSIS — J45909 Unspecified asthma, uncomplicated: Secondary | ICD-10-CM | POA: Diagnosis not present

## 2024-01-22 ENCOUNTER — Other Ambulatory Visit (HOSPITAL_COMMUNITY): Payer: Self-pay | Admitting: Cardiology

## 2024-01-22 ENCOUNTER — Other Ambulatory Visit: Payer: Self-pay | Admitting: Internal Medicine

## 2024-01-25 DIAGNOSIS — G4733 Obstructive sleep apnea (adult) (pediatric): Secondary | ICD-10-CM | POA: Diagnosis not present

## 2024-01-27 DIAGNOSIS — F411 Generalized anxiety disorder: Secondary | ICD-10-CM | POA: Diagnosis not present

## 2024-01-30 ENCOUNTER — Other Ambulatory Visit: Payer: Self-pay | Admitting: Physician Assistant

## 2024-02-01 DIAGNOSIS — F411 Generalized anxiety disorder: Secondary | ICD-10-CM | POA: Diagnosis not present

## 2024-02-03 ENCOUNTER — Encounter: Payer: Self-pay | Admitting: Orthopaedic Surgery

## 2024-02-03 ENCOUNTER — Ambulatory Visit: Payer: 59 | Admitting: Orthopaedic Surgery

## 2024-02-03 DIAGNOSIS — M25561 Pain in right knee: Secondary | ICD-10-CM | POA: Diagnosis not present

## 2024-02-03 DIAGNOSIS — M170 Bilateral primary osteoarthritis of knee: Secondary | ICD-10-CM | POA: Diagnosis not present

## 2024-02-03 DIAGNOSIS — G8929 Other chronic pain: Secondary | ICD-10-CM

## 2024-02-03 DIAGNOSIS — Z6841 Body Mass Index (BMI) 40.0 and over, adult: Secondary | ICD-10-CM

## 2024-02-03 DIAGNOSIS — M25562 Pain in left knee: Secondary | ICD-10-CM | POA: Diagnosis not present

## 2024-02-03 DIAGNOSIS — M1711 Unilateral primary osteoarthritis, right knee: Secondary | ICD-10-CM

## 2024-02-03 DIAGNOSIS — M1712 Unilateral primary osteoarthritis, left knee: Secondary | ICD-10-CM

## 2024-02-03 MED ORDER — METHYLPREDNISOLONE ACETATE 40 MG/ML IJ SUSP
40.0000 mg | INTRAMUSCULAR | Status: AC | PRN
  Administered 2024-02-03: 40 mg via INTRA_ARTICULAR

## 2024-02-03 MED ORDER — LIDOCAINE HCL 1 % IJ SOLN
3.0000 mL | INTRAMUSCULAR | Status: AC | PRN
  Administered 2024-02-03: 3 mL

## 2024-02-03 NOTE — Progress Notes (Signed)
The patient is well-known to me.  She is 63 years old and has debilitating arthritis of both her knees.  She comes in every 3 months for steroid injections.  She has had no acute recent change in her medical status.  She is requesting steroid injections appropriately today to treat the significant pain from her bilateral knee osteoarthritis.  She is not a surgical candidate with having a BMI of over 60.  Both knees have varus malalignment.  I was out of place a steroid injection in both knees today without significant difficulty.  Will see her back in 3 months for repeat steroid injections in her knees.    Procedure Note  Patient: [name]             Date of Birth: 1961/09/12           MRN: [id number]             Visit Date: 02/03/2024  Procedures: Visit Diagnoses:  1. Chronic pain of right knee   2. Chronic pain of left knee   3. Unilateral primary osteoarthritis, right knee   4. Unilateral primary osteoarthritis, left knee   5. Morbid obesity with BMI of 60.0-69.9, adult (HCC)     Large Joint Inj: R knee on 02/03/2024 3:11 PM Indications: diagnostic evaluation and pain Details: 22 G 1.5 in needle, superolateral approach  Arthrogram: No  Medications: 3 mL lidocaine 1 %; 40 mg methylPREDNISolone acetate 40 MG/ML Outcome: tolerated well, no immediate complications Procedure, treatment alternatives, risks and benefits explained, specific risks discussed. Consent was given by the patient. Immediately prior to procedure a time out was called to verify the correct patient, procedure, equipment, support staff and site/side marked as required. Patient was prepped and draped in the usual sterile fashion.    Large Joint Inj: L knee on 02/03/2024 3:11 PM Indications: diagnostic evaluation and pain Details: 22 G 1.5 in needle, superolateral approach  Arthrogram: No  Medications: 3 mL lidocaine 1 %; 40 mg methylPREDNISolone acetate 40 MG/ML Outcome: tolerated well, no immediate  complications Procedure, treatment alternatives, risks and benefits explained, specific risks discussed. Consent was given by the patient. Immediately prior to procedure a time out was called to verify the correct patient, procedure, equipment, support staff and site/side marked as required. Patient was prepped and draped in the usual sterile fashion.

## 2024-02-08 DIAGNOSIS — F411 Generalized anxiety disorder: Secondary | ICD-10-CM | POA: Diagnosis not present

## 2024-02-09 ENCOUNTER — Ambulatory Visit: Payer: 59 | Admitting: Nurse Practitioner

## 2024-02-15 DIAGNOSIS — F411 Generalized anxiety disorder: Secondary | ICD-10-CM | POA: Diagnosis not present

## 2024-02-17 ENCOUNTER — Ambulatory Visit (INDEPENDENT_AMBULATORY_CARE_PROVIDER_SITE_OTHER): Payer: 59 | Admitting: Nurse Practitioner

## 2024-02-17 ENCOUNTER — Encounter: Payer: Self-pay | Admitting: Nurse Practitioner

## 2024-02-17 VITALS — BP 124/78 | HR 76 | Temp 98.7°F | Ht 64.5 in | Wt 374.2 lb

## 2024-02-17 DIAGNOSIS — K219 Gastro-esophageal reflux disease without esophagitis: Secondary | ICD-10-CM

## 2024-02-17 DIAGNOSIS — Z23 Encounter for immunization: Secondary | ICD-10-CM

## 2024-02-17 DIAGNOSIS — F419 Anxiety disorder, unspecified: Secondary | ICD-10-CM | POA: Diagnosis not present

## 2024-02-17 DIAGNOSIS — D6869 Other thrombophilia: Secondary | ICD-10-CM | POA: Diagnosis not present

## 2024-02-17 DIAGNOSIS — Z853 Personal history of malignant neoplasm of breast: Secondary | ICD-10-CM

## 2024-02-17 DIAGNOSIS — Z7984 Long term (current) use of oral hypoglycemic drugs: Secondary | ICD-10-CM | POA: Diagnosis not present

## 2024-02-17 DIAGNOSIS — E119 Type 2 diabetes mellitus without complications: Secondary | ICD-10-CM

## 2024-02-17 DIAGNOSIS — I10 Essential (primary) hypertension: Secondary | ICD-10-CM | POA: Diagnosis not present

## 2024-02-17 DIAGNOSIS — E039 Hypothyroidism, unspecified: Secondary | ICD-10-CM

## 2024-02-17 DIAGNOSIS — I480 Paroxysmal atrial fibrillation: Secondary | ICD-10-CM | POA: Diagnosis not present

## 2024-02-17 DIAGNOSIS — F321 Major depressive disorder, single episode, moderate: Secondary | ICD-10-CM | POA: Diagnosis not present

## 2024-02-17 DIAGNOSIS — G4733 Obstructive sleep apnea (adult) (pediatric): Secondary | ICD-10-CM | POA: Diagnosis not present

## 2024-02-17 DIAGNOSIS — J4530 Mild persistent asthma, uncomplicated: Secondary | ICD-10-CM | POA: Diagnosis not present

## 2024-02-17 DIAGNOSIS — Z1231 Encounter for screening mammogram for malignant neoplasm of breast: Secondary | ICD-10-CM

## 2024-02-17 MED ORDER — FLUTICASONE PROPIONATE 50 MCG/ACT NA SUSP: 2.0000 | Freq: Every day | NASAL | 5 refills | Status: AC

## 2024-02-17 NOTE — Assessment & Plan Note (Signed)
 Patient currently maintained on apixaban 5 mg twice daily.  Continue

## 2024-02-17 NOTE — Progress Notes (Deleted)
     Electrophysiology Clinic Note    Date:  02/17/2024  Patient ID:  [name], [name] Jan 30, 1961, MRN [id number] PCP:  Eden Emms, NP  Cardiologist:  Yvonne Kendall, MD Electrophysiologist: Lanier Prude, MD  ***refresh  Discussed the use of AI scribe software for clinical note transcription with the patient, who gave verbal consent to proceed.   Patient Profile    Chief Complaint: tikosyn follow-up  History of Present Illness: [name] is a 63 y.o. female with PMH notable for persis Afib, HFpEF, CAD, T2DM, OSA, HTN, obesity, ; seen today for Lanier Prude, MD for {VISITTYPE:28148}    Device Information: ***  AAD History: ***    ROS:  Please see the history of present illness. All other systems are reviewed and otherwise negative.    Physical Exam    VS:  There were no vitals taken for this visit. BMI: There is no height or weight on file to calculate BMI.  Wt Readings from Last 3 Encounters:  02/17/24 (!) 374 lb 3.2 oz (169.7 kg)  11/25/23 (!) 373 lb 7.4 oz (169.4 kg)  10/20/23 (!) 364 lb (165.1 kg)     GEN- The patient is well appearing, alert and oriented x 3 today.   Lungs- Clear to ausculation bilaterally, normal work of breathing.  Heart- {Blank single:19197::"Regular","Irregularly irregular"} rate and rhythm, no murmurs, rubs or gallops Extremities- {EDEMA LEVEL:28147::"No"} peripheral edema, warm, dry Skin-  *** device pocket well-healed, no tethering   Device interrogation done today and reviewed by myself:  Battery *** Lead thresholds, impedence, sensing stable *** *** episodes *** changes made today   Studies Reviewed   Previous EP, cardiology notes.    EKG {ACTION; IS/IS HQI:[id number]} ordered. Personal review of EKG from {Blank single:19197::"today","***"} shows:  ***             Assessment and Plan     #) ***   #) ***   {Are you ordering a CV Procedure (e.g. stress test, cath, DCCV, TEE, etc)?   Press F2         :[id number]}   Current medicines are reviewed at length with the patient today.   The patient {ACTIONS; HAS/DOES NOT HAVE:19233} concerns regarding her medicines.  The following changes were made today:  {NONE DEFAULTED:18576}  Labs/ tests ordered today include: *** No orders of the defined types were placed in this encounter.    Disposition: Follow up with {EPMDS:28135} or EP APP {EPFOLLOW UP:28173}   Signed, Sherie Don, NP  02/17/24  7:26 PM  Electrophysiology [facility]

## 2024-02-17 NOTE — Assessment & Plan Note (Signed)
 Patient currently maintained on levothyroxine 50 mcg daily.  Last TSH within normal limits.  Continue medication as prescribed

## 2024-02-17 NOTE — Assessment & Plan Note (Signed)
 History of the same with right mastectomy.  Patient has been cleared by oncology.  Mammogram overdue order placed today

## 2024-02-17 NOTE — Progress Notes (Signed)
 New Patient Office Visit  Subjective    Patient ID: [name], female    DOB: 1961/05/31  Age: 63 y.o. MRN: [id number]  CC:  Chief Complaint  Patient presents with   Establish Care    HPI [name] presents to establish care  HTN: Patient currently maintained on lisinopril, furosemide, diltiazem. Does not check bp at home but does have a cuff   A-fib/CHF: Patient currently maintained on diltiazem, dofetilide, furosemide, potassium, lisinopril, and apixaban.  Patient is followed by cardiology, Steffanie Dunn, MD  OSA: Patient currently on CPAP therapy and followed by pulmonology Dr. Craige Cotta  Asthma patient currently maintained on Symbicort and albuterol as needed and Singulair. Statessymbicort is also as needed and allergy driven. States that she uses the albuterol rarely.   DM2: Patient currently maintained on metformin 1000 mg twice daily.  She does not check sugar at home and does not have the supplies. States that she has been 5.4 and as high as 6.1  Anxiety/depression: Patient currently maintained on duloxetine, clonazepam and is followed by Melony Overly. She is doing trauma therapy weekly. She lost her mother and aung in a Psychologist, educational. She also found out about some spousal betrayed   Hypothyroid: Patient currently maintained on levothyroxine 50 mcg daily  Mammogram: Order needed for [facility] Colonoscopy: 11/28/2021, repeat in 5 years. Jeani Hawking DEXA scan: Too young Pap smear: 2021 [facility] PCP  Tdap: Update today PNA: Up-to-date Shingles: Up-to-date Flu: 10/20/2023  Outpatient Encounter Medications as of 02/17/2024  Medication Sig   albuterol (PROVENTIL HFA;VENTOLIN HFA) 108 (90 Base) MCG/ACT inhaler Inhale 2 puffs into the lungs every 6 (six) hours as needed for wheezing or shortness of breath.   apixaban (ELIQUIS) 5 MG TABS tablet TAKE 1 TABLET BY MOUTH EVERY 12 HOURS   budesonide-formoterol (SYMBICORT) 160-4.5 MCG/ACT inhaler INHALE 1 PUFF BY MOUTH  EVERY 4 HOURS AS NEEDED   fluticasone (FLONASE) 50 MCG/ACT nasal spray Place 2 sprays into both nostrils daily.   Cholecalciferol 125 MCG (5000 UT) CHEW Chew 5,000 Units by mouth daily. (Patient not taking: Reported on 02/17/2024)   clonazePAM (KLONOPIN) 0.5 MG tablet Take 0.5-1 tablets (0.25-0.5 mg total) by mouth 3 (three) times daily as needed for anxiety.   diltiazem (CARDIZEM CD) 180 MG 24 hr capsule TAKE 1 CAPSULE BY MOUTH EVERY DAY   dofetilide (TIKOSYN) 500 MCG capsule TAKE 1 CAPSULE BY MOUTH TWICE A DAY   DULoxetine (CYMBALTA) 60 MG capsule TAKE 1 CAPSULE BY MOUTH EVERY DAY   famotidine (PEPCID) 20 MG tablet Take 20 mg by mouth 2 (two) times daily.   Ferrous Sulfate (IRON PO) Take 1 tablet by mouth daily.   fexofenadine (ALLEGRA) 180 MG tablet Take 180 mg by mouth daily.   furosemide (LASIX) 40 MG tablet Take 1 tablet (40 mg total) by mouth daily for 1 day.   ipratropium-albuterol (DUONEB) 0.5-2.5 (3) MG/3ML SOLN INHALE 3 ML BY MOUTH VIA NEBULIZER EVERY 8 HOURS AS NEEDED (Patient not taking: Reported on 01/05/2024)   KLOR-CON M20 20 MEQ tablet TAKE 1 TABLET BY MOUTH EVERY DAY   levothyroxine (SYNTHROID) 50 MCG tablet Take 50 mcg by mouth daily before breakfast.   lisinopril (ZESTRIL) 10 MG tablet TAKE 1 TABLET BY MOUTH EVERY DAY   magnesium oxide (MAG-OX) 400 (240 Mg) MG tablet TAKE 1 TABLET BY MOUTH TWICE A DAY   metFORMIN (GLUCOPHAGE) 500 MG tablet Take 1,000 mg by mouth 2 (two) times daily.   montelukast (SINGULAIR) 10 MG  tablet Take 10 mg by mouth at bedtime.   Multiple Vitamin (MULTIVITAMIN WITH MINERALS) TABS tablet Take 1 tablet by mouth daily.   Olopatadine HCl (PATADAY OP) Place 1 drop into both eyes daily as needed (itchy eyes).   UNABLE TO FIND CPAP   No facility-administered encounter medications on file as of 02/17/2024.    Past Medical History:  Diagnosis Date   Anxiety 08/23/2014   Chronic heart failure with preserved ejection fraction (HFpEF) (HCC)    a. 02/2000  MUGA: EF 68%; b. 06/2005 Echo: EF 55-65%; c. 06/2017 Echo: EF 60-65%; d. 08/2020 Echo: EF 60-65%, no rwma, nl RV fxn. Mild BAE. Mild MR.   Current moderate episode of major depressive disorder without prior episode (HCC) 12/31/2017   Dependent edema 10/05/2011   DNR (do not resuscitate) 12/06/2022   GERD (gastroesophageal reflux disease) 12/22/1999   Formatting of this note might be different from the original. Controlled by OTC meds   History of breast cancer 10/05/2011   Hypertension 10/05/2011   Long term current use of anticoagulant therapy 08/29/2014   Mild persistent asthma without complication 09/06/2018   Morbid obesity with BMI of 60.0-69.9, adult (HCC) 09/06/2018   Non-obstructive CAD (coronary artery disease)    a. 12/2017 Lexiscan PET/CT: mid anterior, apical lateral, and apical ischemia; b. 01/2018 Cath: LM nl, LAD & LCX 10-30% diff dzs throughout, RCA min irregs (<10%)-->Med rx.   Obstructive sleep apnea, adult 09/06/2018   Paroxysmal atrial fibrillation (HCC) 04/21/2019   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/2014   Type 2 diabetes mellitus without complication, without long-term current use of insulin (HCC) 04/21/2019   Urinary bladder incontinence 06/13/2013    Past Surgical History:  Procedure Laterality Date   CARDIAC CATHETERIZATION  02/16/2018   No stents;Dr. Marcial Pacas, [place] [facility]   COLONOSCOPY WITH PROPOFOL N/A 11/28/2021   Procedure: COLONOSCOPY WITH PROPOFOL;  Surgeon: Jeani Hawking, MD;  Location: [facility];  Service: Endoscopy;  Laterality: N/A;   FINGER SURGERY Right    MASTECTOMY Right     Family History  Problem Relation Age of Onset   Heart Problems Mother    Hypertension Mother    Angina Mother    Atrial fibrillation Mother    Heart Problems Father    Hypertension Father    Depression Sister    Kidney Stones Sister    Depression Brother    Bipolar disorder Cousin    Bipolar disorder Cousin    Kidney cancer Neg Hx    Prostate  cancer Neg Hx    Bladder Cancer Neg Hx     Social History   Socioeconomic History   Marital status: Married    Spouse name: Lane Hacker   Number of children: Not on file   Years of education: Not on file   Highest education level: Some college, no degree  Occupational History   Not on file  Tobacco Use   Smoking status: Former    Current packs/day: 0.00    Average packs/day: 0.3 packs/day for 4.0 years (1.2 ttl pk-yrs)    Types: Cigarettes    Start date: 12/22/1979    Quit date: 12/22/1983    Years since quitting: 40.1   Smokeless tobacco: Never   Tobacco comments:    Former smoker 01/26/23  Vaping Use   Vaping status: Never Used  Substance and Sexual Activity   Alcohol use: Not Currently    Comment: 2x a year   Drug use: No   Sexual activity: Not on  file  Other Topics Concern   Not on file  Social History Narrative   Married, for 14 years, first marriage for both her and husband. They have 2 cats.  Retired on 09/15/2023. Worked in [employer]  No kids.      Hobbies: classic movies, plays board games with a cousin and friend once a week, loves to travel, reads Bible, does devotions.       Legal-no   Caffeine-1-2 Dr. Reino Kent per day   Religious-follower of [employer]   Social Drivers of Health   Financial Resource Strain: Low Risk  (02/13/2024)   Overall Financial Resource Strain (CARDIA)    Difficulty of Paying Living Expenses: Not hard at all  Food Insecurity: No Food Insecurity (02/13/2024)   Hunger Vital Sign    Worried About Running Out of Food in the Last Year: Never true    Ran Out of Food in the Last Year: Never true  Transportation Needs: No Transportation Needs (02/13/2024)   PRAPARE - Administrator, Civil Service (Medical): No    Lack of Transportation (Non-Medical): No  Physical Activity: Inactive (02/13/2024)   Exercise Vital Sign    Days of Exercise per Week: 0 days    Minutes of Exercise per Session: 0 min  Stress: Stress Concern Present  (02/13/2024)   [employer] of Occupational Health - Occupational Stress Questionnaire    Feeling of Stress : To some extent  Social Connections: Moderately Integrated (02/13/2024)   Social Connection and Isolation Panel [NHANES]    Frequency of Communication with Friends and Family: Twice a week    Frequency of Social Gatherings with Friends and Family: Once a week    Attends Religious Services: Never    Database administrator or Organizations: No    Attends Engineer, structural: More than 4 times per year    Marital Status: Married  Catering manager Violence: Not At Risk (10/05/2023)   Humiliation, Afraid, Rape, and Kick questionnaire    Fear of Current or Ex-Partner: No    Emotionally Abused: No    Physically Abused: No    Sexually Abused: No    Review of Systems  Constitutional:  Negative for chills and fever.  Respiratory:  Positive for shortness of breath (DOE).   Cardiovascular:  Negative for chest pain and leg swelling.  Gastrointestinal:  Negative for abdominal pain, blood in stool, constipation, diarrhea, nausea and vomiting.       Bm daily   Genitourinary:  Negative for dysuria and hematuria.  Neurological:  Negative for tingling and headaches.  Psychiatric/Behavioral:  Negative for hallucinations and suicidal ideas.         Objective    BP 124/78   Pulse 76   Temp 98.7 F (37.1 C) (Oral)   Ht 5' 4.5" (1.638 m)   Wt (!) 374 lb 3.2 oz (169.7 kg)   SpO2 92%   BMI 63.24 kg/m   Physical Exam Vitals and nursing note reviewed.  Constitutional:      Appearance: Normal appearance.  HENT:     Right Ear: Tympanic membrane, ear canal and external ear normal.     Left Ear: Tympanic membrane, ear canal and external ear normal.     Mouth/Throat:     Mouth: Mucous membranes are moist.     Pharynx: Oropharynx is clear.  Eyes:     Extraocular Movements: Extraocular movements intact.     Pupils: Pupils are equal, round, and reactive to light.  Cardiovascular:     Rate and Rhythm: Normal rate and regular rhythm.     Pulses: Normal pulses.     Heart sounds: Normal heart sounds.  Pulmonary:     Effort: Pulmonary effort is normal.     Breath sounds: Normal breath sounds.  Abdominal:     General: Bowel sounds are normal. There is no distension.     Palpations: There is no mass.     Tenderness: There is no abdominal tenderness.     Hernia: No hernia is present.  Musculoskeletal:     Right lower leg: No edema.     Left lower leg: No edema.  Lymphadenopathy:     Cervical: No cervical adenopathy.  Skin:    General: Skin is warm.  Neurological:     General: No focal deficit present.     Mental Status: She is alert.     Comments: Bilateral upper and lower extremity strength 5/5  Psychiatric:        Mood and Affect: Mood normal.        Behavior: Behavior normal.        Thought Content: Thought content normal.        Judgment: Judgment normal.         Assessment & Plan:   Problem List Items Addressed This Visit       Cardiovascular and Mediastinum   Essential hypertension - Primary   Patient currently maintained on lisinopril, furosemide, diltiazem.  She is followed by cardiology.  Blood pressure within normal limits today.  Continue medication as prescribed      PAF (paroxysmal atrial fibrillation) (HCC)   Patient currently maintained on diltiazem, dofetilide, furosemide, potassium, lisinopril, and apixaban.  Patient is followed by cardiology and electrophysiology.  Continue following specialist as recommended.  Continue taking medication as prescribed.      Hypercoagulable state due to paroxysmal atrial fibrillation [facility])   Patient currently maintained on apixaban 5 mg twice daily.  Continue        Respiratory   Mild persistent asthma without complication   Patient will use Symbicort and albuterol on a as needed basis.  Does take Singulair daily.  Stable.  Continue medication as prescribed      Obstructive  sleep apnea, adult   Patient does have a CPAP and is adherent to therapy.  She has follow-up with pulmonology.  Continues in CPAP as prescribed and follow-up with pulmonology as recommended        Digestive   GERD (gastroesophageal reflux disease)   Patient currently maintained on famotidine.  Continue        Endocrine   Type 2 diabetes mellitus without complication, without long-term current use of insulin (HCC)   Patient currently maintained on metformin 1000 mg twice daily.  A1c of 6.4% back in January.  Under good control.  Continue work on lifestyle modifications continue taking metformin as prescribed      Hypothyroidism   Patient currently maintained on levothyroxine 50 mcg daily.  Last TSH within normal limits.  Continue medication as prescribed        Other   History of breast cancer   History of the same with right mastectomy.  Patient has been cleared by oncology.  Mammogram overdue order placed today      Morbid obesity (HCC)   Work on healthy lifestyle modifications.      Anxiety   Patient is followed by behavioral health specialist.  She is currently maintained on duloxetine and clonazepam.  Continue taking medication as prescribed continue follow-up therapy as recommended.  Patient denies HI/SI/AVH.      Current moderate episode of major depressive disorder without prior episode [facility])   Patient currently maintained on duloxetine.  She is followed behavioral health specialist.  Continue following with them along with therapist.  Patient denies HI/SI/AVH.      Other Visit Diagnoses       Screening mammogram for breast cancer       Relevant Orders   MM 3D SCREENING MAMMOGRAM BILATERAL BREAST       Return in about 3 months (around 05/16/2024) for DM recheck.   Audria Nine, NP

## 2024-02-17 NOTE — Assessment & Plan Note (Signed)
 Patient currently maintained on metformin 1000 mg twice daily.  A1c of 6.4% back in January.  Under good control.  Continue work on lifestyle modifications continue taking metformin as prescribed

## 2024-02-17 NOTE — Assessment & Plan Note (Signed)
 Patient will use Symbicort and albuterol on a as needed basis.  Does take Singulair daily.  Stable.  Continue medication as prescribed

## 2024-02-17 NOTE — Assessment & Plan Note (Signed)
 Patient currently maintained on famotidine.  Continue

## 2024-02-17 NOTE — Assessment & Plan Note (Signed)
 Patient currently maintained on diltiazem, dofetilide, furosemide, potassium, lisinopril, and apixaban.  Patient is followed by cardiology and electrophysiology.  Continue following specialist as recommended.  Continue taking medication as prescribed.

## 2024-02-17 NOTE — Assessment & Plan Note (Addendum)
Work on healthy lifestyle modifications. 

## 2024-02-17 NOTE — Assessment & Plan Note (Signed)
 Patient does have a CPAP and is adherent to therapy.  She has follow-up with pulmonology.  Continues in CPAP as prescribed and follow-up with pulmonology as recommended

## 2024-02-17 NOTE — Assessment & Plan Note (Signed)
 Patient currently maintained on lisinopril, furosemide, diltiazem.  She is followed by cardiology.  Blood pressure within normal limits today.  Continue medication as prescribed

## 2024-02-17 NOTE — Patient Instructions (Signed)
 Nice to see you today We did update your tetanus vaccine today Follow up with me in 3 months for a diabetes recheck, sooner if you need me

## 2024-02-17 NOTE — Assessment & Plan Note (Signed)
 Patient currently maintained on duloxetine.  She is followed behavioral health specialist.  Continue following with them along with therapist.  Patient denies HI/SI/AVH.

## 2024-02-17 NOTE — Assessment & Plan Note (Signed)
 Patient is followed by behavioral health specialist.  She is currently maintained on duloxetine and clonazepam.  Continue taking medication as prescribed continue follow-up therapy as recommended.  Patient denies HI/SI/AVH.

## 2024-02-18 ENCOUNTER — Ambulatory Visit: Payer: 59 | Admitting: Cardiology

## 2024-02-20 ENCOUNTER — Ambulatory Visit (HOSPITAL_COMMUNITY)
Admission: EM | Admit: 2024-02-20 | Discharge: 2024-02-20 | Disposition: A | Discharge status: Home / Self Care | Attending: Emergency Medicine | Admitting: Emergency Medicine

## 2024-02-20 ENCOUNTER — Encounter (HOSPITAL_COMMUNITY): Payer: Self-pay

## 2024-02-20 DIAGNOSIS — J988 Other specified respiratory disorders: Secondary | ICD-10-CM

## 2024-02-20 DIAGNOSIS — B9789 Other viral agents as the cause of diseases classified elsewhere: Secondary | ICD-10-CM

## 2024-02-20 LAB — POC COVID19/FLU A&B COMBO
Covid Antigen, POC: NEGATIVE
Influenza A Antigen, POC: NEGATIVE
Influenza B Antigen, POC: NEGATIVE

## 2024-02-20 NOTE — ED Triage Notes (Signed)
 Patient here today with c/o cough, ST, headache, weakness, and nasal congestion X 3 days. Patient states that she was exposed to covid. She has been taking Tylenol with some relief.

## 2024-02-20 NOTE — ED Provider Notes (Signed)
 [facility]    CSN: [id number] Arrival date & time: 02/20/24  1544      History   Chief Complaint Chief Complaint  Patient presents with   Cough    HPI [name] is a 63 y.o. female.   Patient presents with cough, sore throat, headache, fatigue, congestion, and mild diarrhea x 3 days. Denies shortness of breath, chest pain, known fever, abdominal pain, nausea, and vomiting. Reports taking Tylenol with some relief. Reports recent COVID exposure.   Cough   Past Medical History:  Diagnosis Date   Anxiety 08/23/2014   Chronic heart failure with preserved ejection fraction (HFpEF) (HCC)    a. 02/2000 MUGA: EF 68%; b. 06/2005 Echo: EF 55-65%; c. 06/2017 Echo: EF 60-65%; d. 08/2020 Echo: EF 60-65%, no rwma, nl RV fxn. Mild BAE. Mild MR.   Current moderate episode of major depressive disorder without prior episode (HCC) 12/31/2017   Dependent edema 10/05/2011   DNR (do not resuscitate) 12/06/2022   GERD (gastroesophageal reflux disease) 12/22/1999   Formatting of this note might be different from the original. Controlled by OTC meds   History of breast cancer 10/05/2011   Hypertension 10/05/2011   Long term current use of anticoagulant therapy 08/29/2014   Mild persistent asthma without complication 09/06/2018   Morbid obesity with BMI of 60.0-69.9, adult (HCC) 09/06/2018   Non-obstructive CAD (coronary artery disease)    a. 12/2017 Lexiscan PET/CT: mid anterior, apical lateral, and apical ischemia; b. 01/2018 Cath: LM nl, LAD & LCX 10-30% diff dzs throughout, RCA min irregs (<10%)-->Med rx.   Obstructive sleep apnea, adult 09/06/2018   Paroxysmal atrial fibrillation (HCC) 04/21/2019   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/2014   Type 2 diabetes mellitus without complication, without long-term current use of insulin (HCC) 04/21/2019   Urinary bladder incontinence 06/13/2013    Patient Active Problem List   Diagnosis Date Noted   Hypothyroidism 02/17/2024   Paroxysmal  A-fib (HCC) 02/22/2023   Hypercoagulable state due to paroxysmal atrial fibrillation (HCC) 01/26/2023   Hypotension 01/04/2023   Overdose of beta-adrenergic antagonist drug 01/04/2023   Gastroenteritis 12/06/2022   DNR (do not resuscitate) 12/06/2022   Gross hematuria 08/10/2020   OSA on CPAP 06/03/2020   PAF (paroxysmal atrial fibrillation) (HCC) 04/21/2019   Chronic heart failure with preserved ejection fraction (HFpEF) (HCC) 04/21/2019   Type 2 diabetes mellitus without complication, without long-term current use of insulin (HCC) 04/21/2019   Cough 09/06/2018   Dyspnea and respiratory abnormality 09/06/2018   Mild persistent asthma without complication 09/06/2018   Morbid obesity with BMI of 60.0-69.9, adult (HCC) 09/06/2018   Need for influenza vaccination 09/06/2018   Obstructive sleep apnea, adult 09/06/2018   Abnormal stress test 01/25/2018   Current moderate episode of major depressive disorder without prior episode (HCC) 12/31/2017   Low serum vitamin D 12/31/2017   Depression 09/20/2014   Long term current use of anticoagulant therapy 08/29/2014   Long-term use of high-risk medication 08/29/2014   Anxiety 08/23/2014   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/2014   Urinary bladder incontinence 06/13/2013   Edema 12/16/2012   Other abnormal glucose 12/16/2012   Essential hypertension 10/05/2011   Dependent edema 10/05/2011   History of breast cancer 10/05/2011   Morbid obesity (HCC) 10/05/2011   GERD (gastroesophageal reflux disease) 12/22/1999    Past Surgical History:  Procedure Laterality Date   CARDIAC CATHETERIZATION  02/16/2018   No stents;Dr. Marcial Pacas, [place] [facility]   COLONOSCOPY WITH PROPOFOL N/A  11/28/2021   Procedure: COLONOSCOPY WITH PROPOFOL;  Surgeon: Jeani Hawking, MD;  Location: [facility];  Service: Endoscopy;  Laterality: N/A;   FINGER SURGERY Right    MASTECTOMY Right     OB History   No obstetric history on file.      Home  Medications    Prior to Admission medications   Medication Sig Start Date End Date Taking? Authorizing Provider  albuterol (PROVENTIL HFA;VENTOLIN HFA) 108 (90 Base) MCG/ACT inhaler Inhale 2 puffs into the lungs every 6 (six) hours as needed for wheezing or shortness of breath. 04/10/17   Lucia Estelle, NP  apixaban (ELIQUIS) 5 MG TABS tablet TAKE 1 TABLET BY MOUTH EVERY 12 HOURS 09/20/23   End, Cristal Deer, MD  budesonide-formoterol (SYMBICORT) 160-4.5 MCG/ACT inhaler INHALE 1 PUFF BY MOUTH EVERY 4 HOURS AS NEEDED 10/26/23   [provider]  Cholecalciferol 125 MCG (5000 UT) CHEW Chew 5,000 Units by mouth daily. Patient not taking: Reported on 02/17/2024    [provider]  clonazePAM (KLONOPIN) 0.5 MG tablet Take 0.5-1 tablets (0.25-0.5 mg total) by mouth 3 (three) times daily as needed for anxiety. 01/05/24   Melony Overly T, PA-C  diltiazem (CARDIZEM CD) 180 MG 24 hr capsule TAKE 1 CAPSULE BY MOUTH EVERY DAY 07/08/23   Fenton, Clint R, PA  dofetilide (TIKOSYN) 500 MCG capsule TAKE 1 CAPSULE BY MOUTH TWICE A DAY 10/29/23   Fenton, Clint R, PA  DULoxetine (CYMBALTA) 60 MG capsule TAKE 1 CAPSULE BY MOUTH EVERY DAY 02/01/24   Melony Overly T, PA-C  famotidine (PEPCID) 20 MG tablet Take 20 mg by mouth 2 (two) times daily.    [provider]  Ferrous Sulfate (IRON PO) Take 1 tablet by mouth daily.    [provider]  fexofenadine (ALLEGRA) 180 MG tablet Take 180 mg by mouth daily.    [provider]  fluticasone (FLONASE) 50 MCG/ACT nasal spray Place 2 sprays into both nostrils daily. 02/17/24   Eden Emms, NP  furosemide (LASIX) 40 MG tablet Take 1 tablet (40 mg total) by mouth daily for 1 day. 12/08/22 10/20/23  Marlin Canary U, DO  KLOR-CON M20 20 MEQ tablet TAKE 1 TABLET BY MOUTH EVERY DAY 10/04/23   Fenton, Clint R, PA  levothyroxine (SYNTHROID) 50 MCG tablet Take 50 mcg by mouth daily before breakfast.    [provider]  lisinopril (ZESTRIL) 10  MG tablet TAKE 1 TABLET BY MOUTH EVERY DAY 01/24/24   End, Cristal Deer, MD  magnesium oxide (MAG-OX) 400 (240 Mg) MG tablet TAKE 1 TABLET BY MOUTH TWICE A DAY 01/25/24   Lanier Prude, MD  metFORMIN (GLUCOPHAGE) 500 MG tablet Take 1,000 mg by mouth 2 (two) times daily. 11/16/22   [provider]  montelukast (SINGULAIR) 10 MG tablet Take 10 mg by mouth at bedtime.    [provider]  Multiple Vitamin (MULTIVITAMIN WITH MINERALS) TABS tablet Take 1 tablet by mouth daily.    [provider]  Olopatadine HCl (PATADAY OP) Place 1 drop into both eyes daily as needed (itchy eyes).    [provider]  UNABLE TO FIND CPAP    [provider]    Family History Family History  Problem Relation Age of Onset   Heart Problems Mother    Hypertension Mother    Angina Mother    Atrial fibrillation Mother    Heart Problems Father    Hypertension Father    Depression Sister    Kidney Stones  Sister    Depression Brother    Bipolar disorder Cousin    Bipolar disorder Cousin    Kidney cancer Neg Hx    Prostate cancer Neg Hx    Bladder Cancer Neg Hx     Social History Social History   Tobacco Use   Smoking status: Former    Current packs/day: 0.00    Average packs/day: 0.3 packs/day for 4.0 years (1.2 ttl pk-yrs)    Types: Cigarettes    Start date: 12/22/1979    Quit date: 12/22/1983    Years since quitting: 40.1   Smokeless tobacco: Never   Tobacco comments:    Former smoker 01/26/23  Vaping Use   Vaping status: Never Used  Substance Use Topics   Alcohol use: Not Currently    Comment: 2x a year   Drug use: No     Allergies   Tape and Erythromycin   Review of Systems Review of Systems  Respiratory:  Positive for cough.    Per HPI  Physical Exam Triage Vital Signs ED Triage Vitals  Encounter Vitals Group     BP 02/20/24 1633 122/70     Systolic BP Percentile --      Diastolic BP Percentile --      Pulse Rate 02/20/24 1633 86      Resp 02/20/24 1633 16     Temp 02/20/24 1633 98.2 F (36.8 C)     Temp Source 02/20/24 1633 Oral     SpO2 02/20/24 1633 95 %     Weight 02/20/24 1634 (!) 374 lb (169.6 kg)     Height 02/20/24 1634 5' 4.5" (1.638 m)     Head Circumference --      Peak Flow --      Pain Score 02/20/24 1635 4     Pain Loc --      Pain Education --      Exclude from Growth Chart --    No data found.  Updated Vital Signs BP 122/70 (BP Location: Left Arm)   Pulse 86   Temp 98.2 F (36.8 C) (Oral)   Resp 16   Ht 5' 4.5" (1.638 m)   Wt (!) 374 lb (169.6 kg)   SpO2 95%   BMI 63.21 kg/m   Visual Acuity Right Eye Distance:   Left Eye Distance:   Bilateral Distance:    Right Eye Near:   Left Eye Near:    Bilateral Near:     Physical Exam Vitals and nursing note reviewed.  Constitutional:      General: She is awake. She is not in acute distress.    Appearance: Normal appearance. She is well-developed and well-groomed. She is not ill-appearing.  HENT:     Right Ear: Tympanic membrane, ear canal and external ear normal.     Left Ear: Tympanic membrane, ear canal and external ear normal.     Nose: Congestion and rhinorrhea present.     Mouth/Throat:     Mouth: Mucous membranes are moist.     Pharynx: Oropharyngeal exudate present. No posterior oropharyngeal erythema.  Cardiovascular:     Rate and Rhythm: Normal rate and regular rhythm.  Pulmonary:     Effort: Pulmonary effort is normal.     Breath sounds: Normal breath sounds.  Musculoskeletal:        General: Normal range of motion.  Skin:    General: Skin is warm and dry.  Neurological:     Mental Status: She is alert.  Psychiatric:        Behavior: Behavior is cooperative.      UC Treatments / Results  Labs (all labs ordered are listed, but only abnormal results are displayed) Labs Reviewed  POC COVID19/FLU A&B COMBO    EKG   Radiology No results found.  Procedures Procedures (including critical care  time)  Medications Ordered in UC Medications - No data to display  Initial Impression / Assessment and Plan / UC Course  I have reviewed the triage vital signs and the nursing notes.  Pertinent labs & imaging results that were available during my care of the patient were reviewed by me and considered in my medical decision making (see chart for details).     Upon assessment congestion and rhinorrhea are present, mild erythema noted to pharynx. Lungs clear bilaterally on auscultation. No other significant findings on exam.   Tested negative for COVID and flu. Discussed OTC medication for symptoms and return precautions.  Final Clinical Impressions(s) / UC Diagnoses   Final diagnoses:  Viral respiratory illness     Discharge Instructions      You tested negative for COVID and flu today. I believe your symptoms are from a viral illness. You can alternate between Tylenol and Ibuprofen as needed for pain and fever. I recommend Mucinex for cough and congestion as needed. Stay hydrated and get plenty of rest. Return here if symptoms persist or worsen.     ED Prescriptions   None    PDMP not reviewed this encounter.   Wynonia Lawman A, NP 02/20/24 1745

## 2024-02-20 NOTE — Discharge Instructions (Signed)
 You tested negative for COVID and flu today. I believe your symptoms are from a viral illness. You can alternate between Tylenol and Ibuprofen as needed for pain and fever. I recommend Mucinex for cough and congestion as needed. Stay hydrated and get plenty of rest. Return here if symptoms persist or worsen.

## 2024-02-21 ENCOUNTER — Ambulatory Visit (INDEPENDENT_AMBULATORY_CARE_PROVIDER_SITE_OTHER): Payer: 59 | Admitting: Physician Assistant

## 2024-02-21 ENCOUNTER — Encounter: Payer: Self-pay | Admitting: Physician Assistant

## 2024-02-21 DIAGNOSIS — G4733 Obstructive sleep apnea (adult) (pediatric): Secondary | ICD-10-CM | POA: Diagnosis not present

## 2024-02-21 DIAGNOSIS — F331 Major depressive disorder, recurrent, moderate: Secondary | ICD-10-CM

## 2024-02-21 DIAGNOSIS — F411 Generalized anxiety disorder: Secondary | ICD-10-CM

## 2024-02-21 MED ORDER — DULOXETINE HCL 30 MG PO CPEP: 90.0000 mg | ORAL_CAPSULE | Freq: Every day | ORAL | 1 refills | Status: DC

## 2024-02-21 NOTE — Progress Notes (Signed)
 [facility]  Patient ID: [name],  MRN: [id number]  PCP: Eden Emms, NP  Date of Evaluation: 02/21/2024 Time spent:20 minutes  Chief Complaint:  Chief Complaint   Depression; Anxiety; Follow-up    HISTORY/CURRENT STATUS: HPI   For routine med check  We increased the Cymbalta 6 weeks ago. She feels quite a bit better. Hasn't needed to talk her self into taking a shower or things like that.  Has more energy and motivation, not waiting till the last minute to do things like she had been.  Patient is able to enjoy things.   No extreme sadness, tearfulness, or feelings of hopelessness.  Sleeps well most of the time, uses CPAP.   Denies any changes in concentration, making decisions, or remembering things.  Appetite has not changed.  Weight is stable.  Anxiety is controlled. The Klonopin helps when needed. Not having PA.   Denies suicidal or homicidal thoughts.  Patient denies increased energy with decreased need for sleep, increased talkativeness, racing thoughts, impulsivity or risky behaviors, increased spending, increased libido, grandiosity, increased irritability or anger, paranoia, or hallucinations.  Denies dizziness, syncope, seizures, numbness, tingling, tremor, tics, unsteady gait, slurred speech, confusion. Denies muscle or joint pain, stiffness, or dystonia.  Individual Medical History/ Review of Systems: Changes? :No     Past meds include Klonopin, Xanax, Cymbalta, Zoloft had to be discontinued due to medication for A-fib, lamotrigine   Allergies: Tape and Erythromycin  Current Medications:  Current Outpatient Medications:    albuterol (PROVENTIL HFA;VENTOLIN HFA) 108 (90 Base) MCG/ACT inhaler, Inhale 2 puffs into the lungs every 6 (six) hours as needed for wheezing or shortness of breath., Disp: 1 Inhaler, Rfl: 2   apixaban (ELIQUIS) 5 MG TABS tablet, TAKE 1 TABLET BY MOUTH EVERY 12 HOURS, Disp: 60 tablet, Rfl: 5   budesonide-formoterol (SYMBICORT)  160-4.5 MCG/ACT inhaler, INHALE 1 PUFF BY MOUTH EVERY 4 HOURS AS NEEDED, Disp: , Rfl:    Cholecalciferol 125 MCG (5000 UT) CHEW, Chew 5,000 Units by mouth daily., Disp: , Rfl:    clonazePAM (KLONOPIN) 0.5 MG tablet, Take 0.5-1 tablets (0.25-0.5 mg total) by mouth 3 (three) times daily as needed for anxiety., Disp: 90 tablet, Rfl: 1   diltiazem (CARDIZEM CD) 180 MG 24 hr capsule, TAKE 1 CAPSULE BY MOUTH EVERY DAY, Disp: 30 capsule, Rfl: 3   dofetilide (TIKOSYN) 500 MCG capsule, TAKE 1 CAPSULE BY MOUTH TWICE A DAY, Disp: 60 capsule, Rfl: 5   DULoxetine (CYMBALTA) 30 MG capsule, Take 3 capsules (90 mg total) by mouth daily., Disp: 90 capsule, Rfl: 1   famotidine (PEPCID) 20 MG tablet, Take 20 mg by mouth 2 (two) times daily., Disp: , Rfl:    Ferrous Sulfate (IRON PO), Take 1 tablet by mouth daily., Disp: , Rfl:    fexofenadine (ALLEGRA) 180 MG tablet, Take 180 mg by mouth daily., Disp: , Rfl:    fluticasone (FLONASE) 50 MCG/ACT nasal spray, Place 2 sprays into both nostrils daily., Disp: 16 g, Rfl: 5   KLOR-CON M20 20 MEQ tablet, TAKE 1 TABLET BY MOUTH EVERY DAY, Disp: 30 tablet, Rfl: 6   levothyroxine (SYNTHROID) 50 MCG tablet, Take 50 mcg by mouth daily before breakfast., Disp: , Rfl:    lisinopril (ZESTRIL) 10 MG tablet, TAKE 1 TABLET BY MOUTH EVERY DAY, Disp: 30 tablet, Rfl: 11   magnesium oxide (MAG-OX) 400 (240 Mg) MG tablet, TAKE 1 TABLET BY MOUTH TWICE A DAY, Disp: 180 tablet, Rfl: 2   metFORMIN (  GLUCOPHAGE) 500 MG tablet, Take 1,000 mg by mouth 2 (two) times daily., Disp: , Rfl:    montelukast (SINGULAIR) 10 MG tablet, Take 10 mg by mouth at bedtime., Disp: , Rfl:    Multiple Vitamin (MULTIVITAMIN WITH MINERALS) TABS tablet, Take 1 tablet by mouth daily., Disp: , Rfl:    Olopatadine HCl (PATADAY OP), Place 1 drop into both eyes daily as needed (itchy eyes)., Disp: , Rfl:    UNABLE TO FIND, CPAP, Disp: , Rfl:    furosemide (LASIX) 40 MG tablet, Take 1 tablet (40 mg total) by mouth daily for 1  day., Disp: 30 tablet, Rfl:  Medication Side Effects: none  Family Medical/ Social History: Changes? No  MENTAL HEALTH EXAM:  There were no vitals taken for this visit.There is no height or weight on file to calculate BMI.  General Appearance: Casual, Well Groomed, and Obese  Eye Contact:  Good  Speech:  Clear and Coherent and Normal Rate  Volume:  Normal  Mood:  Euthymic  Affect:  Congruent  Thought Process:  Goal Directed and Descriptions of Associations: Circumstantial  Orientation:  Full (Time, Place, and Person)  Thought Content: Logical   Suicidal Thoughts:  No  Homicidal Thoughts:  No  Memory:  WNL  Judgement:  Good  Insight:  Good  Psychomotor Activity:   slow, using a cane  Concentration:  Concentration: Good  Recall:  Good  Fund of Knowledge: Good  Language: Good  Assets:  Communication Skills Desire for Improvement Financial Resources/Insurance Housing Transportation  ADL's:  Intact  Cognition: WNL  Prognosis:  Good   DIAGNOSES:    ICD-10-CM   1. Major depressive disorder, recurrent episode, moderate (HCC)  F33.1     2. Generalized anxiety disorder  F41.1     3. Obstructive sleep apnea  G47.33      Receiving Psychotherapy: Yes  Fenton Malling, trauma specialist She saw Rockne Menghini, LCSW in the past  RECOMMENDATIONS:  PDMP reviewed.  Klonopin filled 01/23/2024. I provided 20 minutes of face to face time during this encounter, including time spent before and after the visit in records review, medical decision making, counseling pertinent to today's visit, and charting.   Her mood has improved but I think she can be even better.  Recommend increasing Cymbalta.   Continue Klonopin 0.5 mg, 1/2-1 p.o. 3 times daily as needed anxiety. Increase Cymbalta to 90 mg daily. Continue therapy with Patrecia Pour. Return in 6 weeks.  Melony Overly, PA-C

## 2024-02-21 NOTE — Progress Notes (Unsigned)
 Cardiology Clinic Note   Date: 02/22/2024 ID: Nichole, Richard 1961/03/24, MRN [id number]  [facility] Providers Cardiologist:  Yvonne Kendall, MD Electrophysiologist:  Lanier Prude, MD {  Chief Complaint   [name] is a 63 y.o. female who presents to the clinic today for routine follow up of chronic cardiac conditions.   Patient Profile   [name] is followed by Dr. Okey Dupre and Dr. Lalla Brothers for the history outlined below.      Past medical history significant for: Chest pain. Coronary CTA 01/04/2023: Coronary calcium score of 0 with no evidence of CAD. PAF. Onset July 2015. Chronic HFpEF. Echo 01/05/2023: EF 60 to 65%.  Unable to evaluate wall motion.  Mildly dilated left ventricular internal cavity.  Mild LVH.  Normal diastolic parameters.  Normal RV function.  Mild RVH.  Moderately elevated PA pressure.  Mild RAE.  Trivial MR. Hypertension. Hyperlipidemia. Lipid panel 12/30/2023 (from outside facility): LDL 102, HDL 48, TG 128, total 172. OSA. On CPAP. GERD. Hypothyroidism. T2DM. Breast cancer. S/p mastectomy.  In summary, patient had onset of PAF in July 2015 at which time she experienced palpitations and was hospitalized in [place].  She spontaneously converted to sinus rhythm and was managed with Eliquis and flecainide.  In 2019 she underwent stress testing which showed mid anterior, apical lateral, and apical ischemia.  Patient establish care with Dr. Okey Dupre in May 2020 after moving to [facility].  Echo September 2021 showed EF 60 to 65%, no RWMA, normal diastolic parameters, normal RV size/function, mild BAE, mild MR.  In December 2023 she was seen in follow-up.  She had 3 ED visits for chest discomfort.  In October 2023 chest x-ray suggested mild edema and she was advised to increase Lasix for 3 days.  In November 2023 she experienced irregular tachypalpitations, lightheadedness and chest discomfort.  She was found to be in A-fib and flecainide was  increased to 100 mg twice a day.  9 days later she returned to the ED with chest discomfort and elevated BP.  She was felt to be volume overloaded and chest x-ray suggested pulmonary edema.  She was treated with Lasix.  She denied any further chest discomfort and believes her symptoms were related to anxiety.  She underwent coronary CTA in January 2024 which showed calcium score of 0 as detailed above.  On the day of her coronary CTA she took her medications as instructed but heart rate persisted between 69 and 80 bpm.  She was given an additional dose of IV Lopressor and approximately 30 minutes later heart rate was recorded at 46 bpm and she became hypotensive at 75/32.  She received 500 mL bolus of normal saline.  After administration of 0.8 mg NTG she became more hypotensive going in and out of consciousness and was taken to the emergency room.  She was found to be in junctional rhythm with heart rate in the 30s and BP as low as 50 systolic.  Heart rate improved briefly with 1 mg of epinephrine and continued to improve with glucagon.  Mental status also improved.  She was admitted to ICU where she required norepinephrine for hemodynamic support.  EP was consulted and it was felt she was not a good candidate for resuming flecainide.  Her BMI would not allow for invasive EP procedures.  Decision was made for further follow-up as an outpatient.  She was discharged on 01/08/2023.  She followed up with A-fib clinic to discuss rhythm control  options.  Verapamil was changed to diltiazem and patient was instructed to follow-up with PCP regarding lamotrigine so she could start Tikosyn.  After transitioning from lamotrigine to duloxetine she was admitted to the hospital for Tikosyn loading in March 2024.  Patient was last seen in the office by Dr. Lalla Brothers on 10/20/2023 for routine follow-up.  She was doing well at that time and maintaining sinus rhythm.  No changes were made.     History of Present Illness    Today,  patient is doing well. Patient denies shortness of breath, dyspnea on exertion, lower extremity edema, orthopnea or PND. No chest pain, pressure, or tightness. No palpitations.  She is tolerating medications without difficulty. No blood in stool or urine. Patient reports she has a high amount of anxiety. She spot checks her rhythm with a Kardia mobile. She has not had any recent runs of afib.     ROS: All other systems reviewed and are otherwise negative except as noted in History of Present Illness.  EKGs/Labs Reviewed    EKG Interpretation Date/Time:  Tuesday February 22 2024 15:23:38 EST Ventricular Rate:  78 PR Interval:  148 QRS Duration:  92 QT Interval:  402 QTC Calculation: 458 R Axis:   65  Text Interpretation: Normal sinus rhythm Normal ECG When compared with ECG of 25-Nov-2023 22:27, PREVIOUS ECG IS PRESENT Confirmed by Carlos Levering [phone]) on 02/22/2024 3:35:44 PM   12/30/2023: Creatinine 0.51, BUN 10, sodium 138, potassium 4.4, AST 48, ALT 45, hemoglobin A1c 6.4, WBC 10.6, hemoglobin 12.4, TSH 2.42.  Risk Assessment/Calculations     CHA2DS2-VASc Score = 4   This indicates a 4.8% annual risk of stroke. The patient's score is based upon: CHF History: 1 HTN History: 1 Diabetes History: 1 Stroke History: 0 Vascular Disease History: 0 Age Score: 0 Gender Score: 1             Physical Exam    VS:  BP 122/66 (BP Location: Left Arm, Patient Position: Sitting, Cuff Size: Large)   Pulse 78   Ht 5' 4.5" (1.638 m)   Wt (!) 374 lb (169.6 kg) Comment: 02/17/2024  SpO2 96%   BMI 63.21 kg/m  , BMI Body mass index is 63.21 kg/m.  GEN: Well nourished, well developed, in no acute distress. Neck: No JVD or carotid bruits. Cardiac:  RRR. No murmurs. No rubs or gallops.   Respiratory:  Respirations regular and unlabored. Clear to auscultation without rales, wheezing or rhonchi. GI: Soft, nontender, nondistended. Extremities: Radials/DP/PT 2+ and equal bilaterally. No  clubbing or cyanosis. No edema.  Skin: Warm and dry, no rash. Neuro: Strength intact.  Assessment & Plan   Chronic HFpEF Echo January 2024 showed normal LV/RV function, mild RVH/LVH, normal diastolic parameters, moderately elevated PA pressure, mild RAE, trivial MR.  Patient denies lower extremity edema, shortness of breath, DOE, orthopnea or PND. She reports brisk diuresis with current dose of Lasix.  Volume is difficult to assess secondary to body habitus but overall appears euvolemic and well compensated on exam. -Continue lisinopril, Lasix.  PAF Onset July 2015.  She was initially managed with flecainide and changed to Tikosyn in March 2024.  Patient denies palpitations. She spot checks her rhythm with a Kardia mobile and has not had any recent runs of afib. She denies spontaneous bleeding concerns. EKG today shows NSR, 78 bpm. She recently had labs drawn with PCP and brings in the results today (will scan into chart). Potassium 4.4, creatinine 0.51, BUN 10, AST  48, ALT 45, hgb 12.4. Mg was not included in labs.  -Continue Tikosyn, diltiazem, Eliquis. -Continue to follow with EP.  -Mg today.   Hypertension BP today 122/66.  -Continue lisinopril, diltiazem.  Disposition: Mg today. Return in 1 year or sooner as needed.          Signed, Etta Grandchild. Valera Vallas, DNP, NP-C

## 2024-02-22 ENCOUNTER — Ambulatory Visit: Payer: 59 | Attending: Student | Admitting: Student

## 2024-02-22 ENCOUNTER — Encounter: Payer: Self-pay | Admitting: Student

## 2024-02-22 VITALS — BP 122/66 | HR 78 | Ht 64.5 in | Wt 374.0 lb

## 2024-02-22 DIAGNOSIS — Z79899 Other long term (current) drug therapy: Secondary | ICD-10-CM | POA: Diagnosis not present

## 2024-02-22 DIAGNOSIS — I10 Essential (primary) hypertension: Secondary | ICD-10-CM

## 2024-02-22 DIAGNOSIS — I480 Paroxysmal atrial fibrillation: Secondary | ICD-10-CM

## 2024-02-22 DIAGNOSIS — I5032 Chronic diastolic (congestive) heart failure: Secondary | ICD-10-CM | POA: Diagnosis not present

## 2024-02-22 DIAGNOSIS — G4733 Obstructive sleep apnea (adult) (pediatric): Secondary | ICD-10-CM | POA: Diagnosis not present

## 2024-02-22 NOTE — Patient Instructions (Signed)
 Medication Instructions:  Your Physician recommend you continue on your current medication as directed.    *If you need a refill on your cardiac medications before your next appointment, please call your pharmacy*   Lab Work: Your provider would like for you to have following labs drawn today Mag level.   If you have labs (blood work) drawn today and your tests are completely normal, you will receive your results only by: MyChart Message (if you have MyChart) OR A paper copy in the mail If you have any lab test that is abnormal or we need to change your treatment, we will call you to review the results.   Follow-Up: At [facility], you and your health needs are our priority.  As part of our continuing mission to provide you with exceptional heart care, we have created designated Provider Care Teams.  These Care Teams include your primary Cardiologist (physician) and Advanced Practice Providers (APPs -  Physician Assistants and Nurse Practitioners) who all work together to provide you with the care you need, when you need it.   Your next appointment:   1 year(s)  Provider:   You may see Yvonne Kendall, MD or one of the following Advanced Practice Providers on your designated Care Team:   Nicolasa Ducking, NP Eula Listen, PA-C Cadence Fransico Michael, PA-C Charlsie Quest, NP Carlos Levering, NP

## 2024-02-23 LAB — MAGNESIUM: Magnesium: 1.9 mg/dL (ref 1.6–2.3)

## 2024-02-24 ENCOUNTER — Encounter: Payer: Self-pay | Admitting: Nurse Practitioner

## 2024-02-24 DIAGNOSIS — F411 Generalized anxiety disorder: Secondary | ICD-10-CM | POA: Diagnosis not present

## 2024-02-24 NOTE — Progress Notes (Signed)
 Electrophysiology Clinic Note    Date:  02/25/2024  Patient ID:  [name], [name] May 13, 1961, MRN [id number] PCP:  Eden Emms, NP  Cardiologist:  Yvonne Kendall, MD Electrophysiologist: Lanier Prude, MD   Discussed the use of AI scribe software for clinical note transcription with the patient, who gave verbal consent to proceed.   Patient Profile    Chief Complaint: tikosyn follow-up  History of Present Illness: [name] is a 63 y.o. female with PMH notable for persis Afib, HFpEF, T2DM, OSA, HTN, obesity ; seen today for Lanier Prude, MD for routine electrophysiology followup.  She last saw Dr. Lalla Brothers 09/2023, where she was doing well without AF episodes. She had episode of syncope 11/2023, presented to [facility] ER. Syncope in setting of vomiting and nausea, rec'd zofran by EMS  On follow-up today, she is doing very well from an AF perspective. Has great system for remembering and organizing medications using excel spreadsheet. She also has med list printed in wallet and a myIHR bracelet in case she cannot communicate.  No bleeding concerns, no AF episodes that she is aware of.     AAD History: Tikosyn - loaded 02/2023    ROS:  Please see the history of present illness. All other systems are reviewed and otherwise negative.    Physical Exam    VS:  BP (!) 120/58 (BP Location: Left Arm, Patient Position: Sitting, Cuff Size: Large)   Pulse 79   Ht 5' 4.5" (1.638 m)   Wt (!) 374 lb (169.6 kg)   SpO2 96%   BMI 63.21 kg/m  BMI: Body mass index is 63.21 kg/m.  Wt Readings from Last 3 Encounters:  02/25/24 (!) 374 lb (169.6 kg)  02/22/24 (!) 374 lb (169.6 kg)  02/20/24 (!) 374 lb (169.6 kg)     GEN- The patient is well appearing, alert and oriented x 3 today.   Lungs- Clear to ausculation bilaterally, normal work of breathing.  Heart- Regular rate and rhythm, no murmurs, rubs or gallops Extremities- Trace peripheral edema - difficult to assess d/t body  habitus, warm, dry    Studies Reviewed   Previous EP, cardiology notes.    EKG is not ordered. Personal review of EKG from  02/22/2024  shows:  SR at 78bpm, RAD QT 402, QTC 458   11/25/2023 EKG - SR at 87bpm, RAD QT , QTC 480        TTE, 01/05/2023  1. Left ventricular ejection fraction, by estimation, is 60 to 65%. The left ventricle has normal function. Left ventricular endocardial border not optimally defined to evaluate regional wall motion. The left ventricular internal cavity size was mildly dilated. There is mild left ventricular hypertrophy. Left ventricular diastolic parameters were normal.   2. Right ventricular systolic function is normal. The right ventricular size is mildly enlarged. There is moderately elevated pulmonary artery systolic pressure.   3. Right atrial size was mildly dilated.   4. The mitral valve is grossly normal. Trivial mitral valve regurgitation.   5. The aortic valve was not well visualized. Aortic valve regurgitation is not visualized. No aortic stenosis is present.   6. The inferior vena cava is dilated in size with <50% respiratory variability, suggesting right atrial pressure of 15 mmHg.   Coronary CTA, 01/04/2023 1. Normal coronary calcium score of 0.  Patient is low risk.  2. Normal coronary origin with right dominance.  3. No evidence of CAD.  4. CAD-RADS 0.  Consider non-atherosclerotic causes of chest pain.  5. Image quality degraded by obesity related artifacts.  TTE, 08/30/2020  1. Left ventricular ejection fraction, by estimation, is 60 to 65%. The left ventricle has normal function. The left ventricle has no regional wall motion abnormalities. Left ventricular diastolic parameters were normal.   2. Right ventricular systolic function is normal. The right ventricular size is normal. Tricuspid regurgitation signal is inadequate for assessing PA pressure.   3. Left atrial size was mildly dilated.   4. Right atrial size was mildly dilated.    5. Mild mitral valve regurgitation.     Assessment and Plan     #) persis AFib #) high risk medication use - tikosyn EKG with stable QTC compared to prior; today vs 480 previously Continue tikosyn every 12 hours Great medication compliance Recommended to avoid QTC prolonging medications, like zofran  #) Hypercoag d/t persis afib CHA2DS2-VASc Score = at least 4 [CHF History: 1, HTN History: 1, Diabetes History: 1, Stroke History: 0, Vascular Disease History: 0, Age Score: 0, Gender Score: 1].  Therefore, the patient's annual risk of stroke is 4.8 %.    Stroke ppx - 5mg  eliquis BID, appropriately dosed No bleeding concerns      Current medicines are reviewed at length with the patient today.   The patient does not have concerns regarding her medicines.  The following changes were made today:  none  Labs/ tests ordered today include:  No orders of the defined types were placed in this encounter.    Disposition: Follow up with Dr. Lalla Brothers or EP APP  in 3-4 months    Signed, Sherie Don, NP  02/25/24  2:16 PM  Electrophysiology [facility]

## 2024-02-25 ENCOUNTER — Encounter: Payer: Self-pay | Admitting: Cardiology

## 2024-02-25 ENCOUNTER — Ambulatory Visit: Attending: Cardiology | Admitting: Cardiology

## 2024-02-25 VITALS — BP 120/58 | HR 79 | Ht 64.5 in | Wt 374.0 lb

## 2024-02-25 DIAGNOSIS — D6869 Other thrombophilia: Secondary | ICD-10-CM

## 2024-02-25 DIAGNOSIS — I4819 Other persistent atrial fibrillation: Secondary | ICD-10-CM

## 2024-02-25 DIAGNOSIS — Z5181 Encounter for therapeutic drug level monitoring: Secondary | ICD-10-CM | POA: Diagnosis not present

## 2024-02-25 DIAGNOSIS — G4733 Obstructive sleep apnea (adult) (pediatric): Secondary | ICD-10-CM | POA: Diagnosis not present

## 2024-02-25 DIAGNOSIS — Z79899 Other long term (current) drug therapy: Secondary | ICD-10-CM | POA: Diagnosis not present

## 2024-02-25 DIAGNOSIS — I5032 Chronic diastolic (congestive) heart failure: Secondary | ICD-10-CM | POA: Diagnosis not present

## 2024-02-25 MED ORDER — DOFETILIDE 500 MCG PO CAPS: 500.0000 ug | ORAL_CAPSULE | Freq: Two times a day (BID) | ORAL | 1 refills | Status: DC

## 2024-02-25 MED ORDER — DILTIAZEM HCL ER COATED BEADS 180 MG PO CP24: 180.0000 mg | ORAL_CAPSULE | Freq: Every day | ORAL | 3 refills | Status: AC

## 2024-02-25 NOTE — Addendum Note (Signed)
 Addended by: Sherie Don on: 02/25/2024 03:32 PM   Modules accepted: Level of Service

## 2024-02-25 NOTE — Patient Instructions (Addendum)
 Medication Instructions:   Do not take zofran. You were recently prescribed this from the ER to take as needed.    The current medical regimen is effective;  continue present plan and medications.  *If you need a refill on your cardiac medications before your next appointment, please call your pharmacy*   Follow-Up: At [facility], you and your health needs are our priority.  As part of our continuing mission to provide you with exceptional heart care, we have created designated Provider Care Teams.  These Care Teams include your primary Cardiologist (physician) and Advanced Practice Providers (APPs -  Physician Assistants and Nurse Practitioners) who all work together to provide you with the care you need, when you need it.  We recommend signing up for the patient portal called "MyChart".  Sign up information is provided on this After Visit Summary.  MyChart is used to connect with patients for Virtual Visits (Telemedicine).  Patients are able to view lab/test results, encounter notes, upcoming appointments, etc.  Non-urgent messages can be sent to your provider as well.   To learn more about what you can do with MyChart, go to [email or web address].    Your next appointment:   4 month(s)  Provider:   Steffanie Dunn, MD or Sherie Don, NP

## 2024-02-28 ENCOUNTER — Encounter: Payer: Self-pay | Admitting: Nurse Practitioner

## 2024-02-28 DIAGNOSIS — F411 Generalized anxiety disorder: Secondary | ICD-10-CM | POA: Diagnosis not present

## 2024-03-02 MED ORDER — METFORMIN HCL 500 MG PO TABS: 1000.0000 mg | ORAL_TABLET | Freq: Two times a day (BID) | ORAL | 5 refills | Status: DC

## 2024-03-02 MED ORDER — ALBUTEROL SULFATE HFA 108 (90 BASE) MCG/ACT IN AERS: 2.0000 | INHALATION_SPRAY | Freq: Four times a day (QID) | RESPIRATORY_TRACT | 1 refills | Status: DC | PRN

## 2024-03-06 ENCOUNTER — Encounter: Payer: Self-pay | Admitting: Family

## 2024-03-07 DIAGNOSIS — F411 Generalized anxiety disorder: Secondary | ICD-10-CM | POA: Diagnosis not present

## 2024-03-14 DIAGNOSIS — F411 Generalized anxiety disorder: Secondary | ICD-10-CM | POA: Diagnosis not present

## 2024-03-17 ENCOUNTER — Other Ambulatory Visit: Payer: Self-pay | Admitting: Physician Assistant

## 2024-03-21 ENCOUNTER — Other Ambulatory Visit: Payer: Self-pay | Admitting: Internal Medicine

## 2024-03-21 DIAGNOSIS — F411 Generalized anxiety disorder: Secondary | ICD-10-CM | POA: Diagnosis not present

## 2024-03-21 NOTE — Telephone Encounter (Signed)
 Refill request

## 2024-03-21 NOTE — Telephone Encounter (Signed)
 Prescription refill request for Eliquis received. Indication: PAF Last office visit: 02/25/24  Percell Belt NP Scr: 0.5 on 12/30/23  Epic Age: 63 Weight: 169.6kg  Based on above findings Eliquis 5mg  twice daily is the appropriate dose.  Refill approved.

## 2024-03-24 DIAGNOSIS — G4733 Obstructive sleep apnea (adult) (pediatric): Secondary | ICD-10-CM | POA: Diagnosis not present

## 2024-03-28 DIAGNOSIS — F4381 Prolonged grief disorder: Secondary | ICD-10-CM | POA: Diagnosis not present

## 2024-04-03 ENCOUNTER — Encounter: Payer: Self-pay | Admitting: Nurse Practitioner

## 2024-04-04 DIAGNOSIS — F4381 Prolonged grief disorder: Secondary | ICD-10-CM | POA: Diagnosis not present

## 2024-04-04 MED ORDER — IRON 325 (65 FE) MG PO TABS: 325.0000 mg | ORAL_TABLET | Freq: Every day | ORAL | 1 refills | Status: DC

## 2024-04-10 ENCOUNTER — Ambulatory Visit (INDEPENDENT_AMBULATORY_CARE_PROVIDER_SITE_OTHER): Admitting: Physician Assistant

## 2024-04-10 ENCOUNTER — Encounter: Payer: Self-pay | Admitting: Physician Assistant

## 2024-04-10 DIAGNOSIS — F411 Generalized anxiety disorder: Secondary | ICD-10-CM | POA: Diagnosis not present

## 2024-04-10 DIAGNOSIS — E66813 Obesity, class 3: Secondary | ICD-10-CM

## 2024-04-10 DIAGNOSIS — F3341 Major depressive disorder, recurrent, in partial remission: Secondary | ICD-10-CM

## 2024-04-10 DIAGNOSIS — F5105 Insomnia due to other mental disorder: Secondary | ICD-10-CM

## 2024-04-10 DIAGNOSIS — F4310 Post-traumatic stress disorder, unspecified: Secondary | ICD-10-CM | POA: Diagnosis not present

## 2024-04-10 DIAGNOSIS — F99 Mental disorder, not otherwise specified: Secondary | ICD-10-CM

## 2024-04-10 DIAGNOSIS — F4381 Prolonged grief disorder: Secondary | ICD-10-CM | POA: Diagnosis not present

## 2024-04-10 DIAGNOSIS — G4733 Obstructive sleep apnea (adult) (pediatric): Secondary | ICD-10-CM

## 2024-04-10 MED ORDER — CLONAZEPAM 0.5 MG PO TABS
0.2500 mg | ORAL_TABLET | Freq: Three times a day (TID) | ORAL | 1 refills | Status: DC | PRN
Start: 2024-04-10 — End: 2024-07-11

## 2024-04-10 MED ORDER — DULOXETINE HCL 30 MG PO CPEP
90.0000 mg | ORAL_CAPSULE | Freq: Every day | ORAL | 1 refills | Status: DC
Start: 2024-04-10 — End: 2024-10-11

## 2024-04-10 NOTE — Progress Notes (Signed)
 [facility] Med Check  Patient ID: NEVELYN MELLOTT,  MRN: [id number]  PCP: Dorothe Gaster, NP  Date of Evaluation: 04/10/2024 Time spent:20 minutes  Chief Complaint:  Chief Complaint   Anxiety; Depression; Follow-up    HISTORY/CURRENT STATUS: HPI   For routine med check  We increased the Cymbalta  6 weeks ago. States her mood is 'level.' Having trouble sleeping, coughing a lot at night. Has allergies, feels like it could be partly d/t that. Pollen has been bad this year. Anxiety is controlled with the Klonopin . She needs it at least twice a day, sometimes three. Not having PA, just gets overwhelmed.   Energy and motivation are fair.  No extreme sadness, tearfulness, or feelings of hopelessness. ADLs and personal hygiene are normal.   Denies any changes in concentration, making decisions, or remembering things.  Appetite has not changed.  Weight is stable.  Denies suicidal or homicidal thoughts.  Patient denies increased energy with decreased need for sleep, increased talkativeness, racing thoughts, impulsivity or risky behaviors, increased spending, increased libido, grandiosity, increased irritability or anger, paranoia, or hallucinations.  Denies dizziness, syncope, seizures, numbness, tingling, tremor, tics, unsteady gait, slurred speech, confusion.  No dystonia.  Individual Medical History/ Review of Systems: Changes? :No     Past meds include Klonopin , Xanax, Cymbalta , Zoloft had to be discontinued due to medication for A-fib, lamotrigine    Allergies: Tape and Erythromycin  Current Medications:  Current Outpatient Medications:    albuterol  (VENTOLIN  HFA) 108 (90 Base) MCG/ACT inhaler, Inhale 2 puffs into the lungs every 6 (six) hours as needed for wheezing or shortness of breath., Disp: 1 each, Rfl: 1   budesonide-formoterol (SYMBICORT) 160-4.5 MCG/ACT inhaler, INHALE 1 PUFF BY MOUTH EVERY 4 HOURS AS NEEDED, Disp: , Rfl:    Cholecalciferol 125 MCG (5000 UT) CHEW, Chew 5,000  Units by mouth daily., Disp: , Rfl:    diltiazem  (CARDIZEM  CD) 180 MG 24 hr capsule, Take 1 capsule (180 mg total) by mouth daily., Disp: 90 capsule, Rfl: 3   dofetilide  (TIKOSYN ) 500 MCG capsule, Take 1 capsule (500 mcg total) by mouth 2 (two) times daily., Disp: 180 capsule, Rfl: 1   ELIQUIS  5 MG TABS tablet, TAKE 1 TABLET BY MOUTH EVERY 12 HOURS, Disp: 60 tablet, Rfl: 5   famotidine (PEPCID) 20 MG tablet, Take 20 mg by mouth 2 (two) times daily., Disp: , Rfl:    Ferrous Sulfate  (IRON ) 325 (65 Fe) MG TABS, Take 1 tablet (325 mg total) by mouth daily., Disp: 30 tablet, Rfl: 1   fexofenadine (ALLEGRA) 180 MG tablet, Take 180 mg by mouth daily., Disp: , Rfl:    fluticasone  (FLONASE ) 50 MCG/ACT nasal spray, Place 2 sprays into both nostrils daily., Disp: 16 g, Rfl: 5   KLOR-CON  M20 20 MEQ tablet, TAKE 1 TABLET BY MOUTH EVERY DAY, Disp: 30 tablet, Rfl: 6   levothyroxine  (SYNTHROID ) 50 MCG tablet, Take 50 mcg by mouth daily before breakfast., Disp: , Rfl:    lisinopril  (ZESTRIL ) 10 MG tablet, TAKE 1 TABLET BY MOUTH EVERY DAY, Disp: 30 tablet, Rfl: 11   magnesium  oxide (MAG-OX) 400 (240 Mg) MG tablet, TAKE 1 TABLET BY MOUTH TWICE A DAY, Disp: 180 tablet, Rfl: 2   metFORMIN  (GLUCOPHAGE ) 500 MG tablet, Take 2 tablets (1,000 mg total) by mouth 2 (two) times daily., Disp: 120 tablet, Rfl: 5   montelukast  (SINGULAIR ) 10 MG tablet, Take 10 mg by mouth at bedtime., Disp: , Rfl:    Olopatadine  HCl (PATADAY  OP), Place 1  drop into both eyes daily as needed (itchy eyes)., Disp: , Rfl:    Prenatal Vit-Fe Fumarate-FA (M-NATAL PLUS) 27-1 MG TABS, Take 1 tablet by mouth daily., Disp: , Rfl:    UNABLE TO FIND, CPAP, Disp: , Rfl:    clonazePAM  (KLONOPIN ) 0.5 MG tablet, Take 0.5-1 tablets (0.25-0.5 mg total) by mouth 3 (three) times daily as needed for anxiety., Disp: 90 tablet, Rfl: 1   DULoxetine  (CYMBALTA ) 30 MG capsule, Take 3 capsules (90 mg total) by mouth daily., Disp: 270 capsule, Rfl: 1   furosemide  (LASIX ) 40  MG tablet, Take 1 tablet (40 mg total) by mouth daily for 1 day., Disp: 30 tablet, Rfl:  Medication Side Effects: none  Family Medical/ Social History: Changes? No  MENTAL HEALTH EXAM:  There were no vitals taken for this visit.There is no height or weight on file to calculate BMI.  General Appearance: Casual, Well Groomed, and Obese  Eye Contact:  Good  Speech:  Clear and Coherent and Normal Rate  Volume:  Normal  Mood:  Euthymic  Affect:  Congruent  Thought Process:  Goal Directed and Descriptions of Associations: Circumstantial  Orientation:  Full (Time, Place, and Person)  Thought Content: Logical   Suicidal Thoughts:  No  Homicidal Thoughts:  No  Memory:  WNL  Judgement:  Good  Insight:  Good  Psychomotor Activity:   slow, using a cane  Concentration:  Concentration: Good and Attention Span: Good  Recall:  Good  Fund of Knowledge: Good  Language: Good  Assets:  Communication Skills Desire for Improvement Financial Resources/Insurance Housing Transportation  ADL's:  Intact  Cognition: WNL  Prognosis:  Good   DIAGNOSES:    ICD-10-CM   1. Recurrent major depression in partial remission (HCC)  F33.41     2. Generalized anxiety disorder  F41.1     3. Insomnia due to other mental disorder  F51.05    F99     4. Obstructive sleep apnea  G47.33     5. PTSD (post-traumatic stress disorder)  F43.10     6. Class 3 severe obesity due to excess calories with serious comorbidity in adult, unspecified BMI [facility])  U04.540    E66.01      Receiving Psychotherapy: Yes  Carolan Chuck, trauma specialist She saw Reid Capuchin, LCSW in the past  RECOMMENDATIONS:  PDMP reviewed.  Klonopin  filled 04/01/2024.   I provided 20 minutes of face to face time during this encounter, including time spent before and after the visit in records review, medical decision making, counseling pertinent to today's visit, and charting.   From a mental health med standpoint, she's doing well, so  no changes will be made.   Continue Klonopin  0.5 mg, 1/2-1 p.o. 3 times daily as needed anxiety. Continue Cymbalta  90 mg daily. Continue therapy with Michelle Gallamore. Return in 3 months.  Marvia Slocumb, PA-C

## 2024-04-13 ENCOUNTER — Ambulatory Visit: Payer: Self-pay

## 2024-04-13 NOTE — Telephone Encounter (Signed)
 Copied from CRM [id number]. Topic: Clinical - Red Word Triage >> Apr 13, 2024  1:37 PM [place] E wrote: [facility] that prompted transfer to Nurse Triage: Worsening cough and back pain. Patient stated she has had a worsening cough the past week to 2 weeks and her cough is producing a red/brown phlegm. Patient stated the coughing is leading to back pain and she cannot sleep comfortably at night.  Chief Complaint: cough with brownish red sputum Symptoms: cough Frequency: for two weeks Pertinent Negatives: Patient denies fever, cp Disposition: [] ED /[] Urgent Care (no appt availability in office) / [x] Appointment(In office/virtual)/ []  [facility]/ [] Home Care/ [] Refused Recommended Disposition /[] [facility]/ []  Follow-up with PCP Additional Notes: per protocol apt set for tomorrow; care advice given, denies questions; instructed to go to ER if becomes worse.   Reason for Disposition  [1] MILD difficulty breathing (e.g., minimal/no SOB at rest, SOB with walking, pulse <100) AND [2] still present when not coughing  Answer Assessment - Initial Assessment Questions 1. ONSET: "When did the cough begin?"       2 weeks ago 2. SEVERITY: "How bad is the cough today?"      severe 3. SPUTUM: "Describe the color of your sputum" (none, dry cough; clear, white, yellow, green)     Brownish red 4. HEMOPTYSIS: "Are you coughing up any blood?" If so ask: "How much?" (flecks, streaks, tablespoons, etc.)     Yes, streaks 5. DIFFICULTY BREATHING: "Are you having difficulty breathing?" If Yes, ask: "How bad is it?" (e.g., mild, moderate, severe)    - MILD: No SOB at rest, mild SOB with walking, speaks normally in sentences, can lie down, no retractions, pulse < 100.    - MODERATE: SOB at rest, SOB with minimal exertion and prefers to sit, cannot lie down flat, speaks in phrases, mild retractions, audible wheezing, pulse 100-120.    - SEVERE: Very SOB at rest, speaks in single words,  struggling to breathe, sitting hunched forward, retractions, pulse > 120      95% on RA has spo2 6. FEVER: "Do you have a fever?" If Yes, ask: "What is your temperature, how was it measured, and when did it start?"     no 7. CARDIAC HISTORY: "Do you have any history of heart disease?" (e.g., heart attack, congestive heart failure)      denies 8. LUNG HISTORY: "Do you have any history of lung disease?"  (e.g., pulmonary embolus, asthma, emphysema)     Bronchitis, asthma 9. PE RISK FACTORS: "Do you have a history of blood clots?" (or: recent major surgery, recent prolonged travel, bedridden)     no 10. OTHER SYMPTOMS: "Do you have any other symptoms?" (e.g., runny nose, wheezing, chest pain)       wheezing 11. PREGNANCY: "Is there any chance you are pregnant?" "When was your last menstrual period?"       na 12. TRAVEL: "Have you traveled out of the country in the last month?" (e.g., travel history, exposures)       no  Protocols used: Cough - Acute Productive-A-AH

## 2024-04-13 NOTE — Telephone Encounter (Signed)
 Agree with evaluation and appreciate Dr. Joelle Musca seeing them

## 2024-04-14 ENCOUNTER — Encounter: Payer: Self-pay | Admitting: Internal Medicine

## 2024-04-14 ENCOUNTER — Ambulatory Visit (INDEPENDENT_AMBULATORY_CARE_PROVIDER_SITE_OTHER): Admitting: Internal Medicine

## 2024-04-14 VITALS — BP 118/70 | HR 90 | Temp 98.9°F | Ht 64.5 in | Wt 382.0 lb

## 2024-04-14 DIAGNOSIS — J45901 Unspecified asthma with (acute) exacerbation: Secondary | ICD-10-CM | POA: Insufficient documentation

## 2024-04-14 DIAGNOSIS — J4521 Mild intermittent asthma with (acute) exacerbation: Secondary | ICD-10-CM | POA: Diagnosis not present

## 2024-04-14 MED ORDER — PREDNISONE 20 MG PO TABS: 40.0000 mg | ORAL_TABLET | Freq: Every day | ORAL | 0 refills | Status: DC

## 2024-04-14 MED ORDER — DOXYCYCLINE HYCLATE 100 MG PO TABS: 100.0000 mg | ORAL_TABLET | Freq: Two times a day (BID) | ORAL | 0 refills | Status: DC

## 2024-04-14 MED ORDER — BENZONATATE 200 MG PO CAPS: 200.0000 mg | ORAL_CAPSULE | Freq: Three times a day (TID) | ORAL | 0 refills | Status: DC | PRN

## 2024-04-14 MED ORDER — BUDESONIDE-FORMOTEROL FUMARATE 160-4.5 MCG/ACT IN AERO: 2.0000 | INHALATION_SPRAY | Freq: Two times a day (BID) | RESPIRATORY_TRACT | 3 refills | Status: AC

## 2024-04-14 NOTE — Progress Notes (Signed)
 Subjective:    Patient ID: [name], female    DOB: December 16, 1961, 63 y.o.   MRN: [id number]  HPI Here due to cough  Has had mostly nighttime cough for a couple of weeks Related it to the pollen Last night---pain in back and across neck Felt "foggy in my head" 1 week ago--"no energy"--and still feels "disconnected" Last night she coughed up some brown stuff that was concerning for blood  Temperature elevated for her---up to 100 when usually not over 97 Taking tylenol  for this Some goosebumps--but no sweats Some DOE--relates to weight and asthma (not really worse) Is wheezing at night---does wear CPAP--and the wheezing would resonate  Only using the budesonide prn--only used 5-6 times in the past 2 weeks (only at night) Not using the albuterol   Current Outpatient Medications on File Prior to Visit  Medication Sig Dispense Refill   albuterol  (VENTOLIN  HFA) 108 (90 Base) MCG/ACT inhaler Inhale 2 puffs into the lungs every 6 (six) hours as needed for wheezing or shortness of breath. 1 each 1   budesonide-formoterol (SYMBICORT) 160-4.5 MCG/ACT inhaler INHALE 1 PUFF BY MOUTH EVERY 4 HOURS AS NEEDED     Cholecalciferol 125 MCG (5000 UT) CHEW Chew 5,000 Units by mouth daily.     clonazePAM  (KLONOPIN ) 0.5 MG tablet Take 0.5-1 tablets (0.25-0.5 mg total) by mouth 3 (three) times daily as needed for anxiety. 90 tablet 1   diltiazem  (CARDIZEM  CD) 180 MG 24 hr capsule Take 1 capsule (180 mg total) by mouth daily. 90 capsule 3   dofetilide  (TIKOSYN ) 500 MCG capsule Take 1 capsule (500 mcg total) by mouth 2 (two) times daily. 180 capsule 1   DULoxetine  (CYMBALTA ) 30 MG capsule Take 3 capsules (90 mg total) by mouth daily. 270 capsule 1   ELIQUIS  5 MG TABS tablet TAKE 1 TABLET BY MOUTH EVERY 12 HOURS 60 tablet 5   famotidine (PEPCID) 20 MG tablet Take 20 mg by mouth 2 (two) times daily.     Ferrous Sulfate  (IRON ) 325 (65 Fe) MG TABS Take 1 tablet (325 mg total) by mouth daily. 30 tablet 1    fexofenadine (ALLEGRA) 180 MG tablet Take 180 mg by mouth daily.     fluticasone  (FLONASE ) 50 MCG/ACT nasal spray Place 2 sprays into both nostrils daily. 16 g 5   KLOR-CON  M20 20 MEQ tablet TAKE 1 TABLET BY MOUTH EVERY DAY 30 tablet 6   levothyroxine  (SYNTHROID ) 50 MCG tablet Take 50 mcg by mouth daily before breakfast.     lisinopril  (ZESTRIL ) 10 MG tablet TAKE 1 TABLET BY MOUTH EVERY DAY 30 tablet 11   magnesium  oxide (MAG-OX) 400 (240 Mg) MG tablet TAKE 1 TABLET BY MOUTH TWICE A DAY 180 tablet 2   metFORMIN  (GLUCOPHAGE ) 500 MG tablet Take 2 tablets (1,000 mg total) by mouth 2 (two) times daily. 120 tablet 5   montelukast  (SINGULAIR ) 10 MG tablet Take 10 mg by mouth at bedtime.     Olopatadine  HCl (PATADAY  OP) Place 1 drop into both eyes daily as needed (itchy eyes).     Prenatal Vit-Fe Fumarate-FA (M-NATAL PLUS) 27-1 MG TABS Take 1 tablet by mouth daily.     UNABLE TO FIND CPAP     furosemide  (LASIX ) 40 MG tablet Take 1 tablet (40 mg total) by mouth daily for 1 day. 30 tablet    No current facility-administered medications on file prior to visit.    Allergies  Allergen Reactions   Tape Rash    Paper  Erythromycin Diarrhea    Past Medical History:  Diagnosis Date   Anxiety 08/23/2014   Chronic heart failure with preserved ejection fraction (HFpEF) (HCC)    a. 02/2000 MUGA: EF 68%; b. 06/2005 Echo: EF 55-65%; c. 06/2017 Echo: EF 60-65%; d. 08/2020 Echo: EF 60-65%, no rwma, nl RV fxn. Mild BAE. Mild MR.   Current moderate episode of major depressive disorder without prior episode (HCC) 12/31/2017   Dependent edema 10/05/2011   DNR (do not resuscitate) 12/06/2022   GERD (gastroesophageal reflux disease) 12/22/1999   Formatting of this note might be different from the original. Controlled by OTC meds   History of breast cancer 10/05/2011   Hypertension 10/05/2011   Long term current use of anticoagulant therapy 08/29/2014   Mild persistent asthma without complication 09/06/2018    Morbid obesity with BMI of 60.0-69.9, adult (HCC) 09/06/2018   Non-obstructive CAD (coronary artery disease)    a. 12/2017 Lexiscan PET/CT: mid anterior, apical lateral, and apical ischemia; b. 01/2018 Cath: LM nl, LAD & LCX 10-30% diff dzs throughout, RCA min irregs (<10%)-->Med rx.   Obstructive sleep apnea, adult 09/06/2018   Paroxysmal atrial fibrillation (HCC) 04/21/2019   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/2014   Type 2 diabetes mellitus without complication, without long-term current use of insulin  (HCC) 04/21/2019   Urinary bladder incontinence 06/13/2013    Past Surgical History:  Procedure Laterality Date   CARDIAC CATHETERIZATION  02/16/2018   No stents;Dr. Deriso [place], [place] [facility]   COLONOSCOPY WITH PROPOFOL  N/A 11/28/2021   Procedure: COLONOSCOPY WITH PROPOFOL ;  Surgeon: Alvis Jourdain, MD;  Location: [facility];  Service: Endoscopy;  Laterality: N/A;   FINGER SURGERY Right    MASTECTOMY Right     Family History  Problem Relation Age of Onset   Heart Problems Mother    Hypertension Mother    Angina Mother    Atrial fibrillation Mother    Heart Problems Father    Hypertension Father    Depression Sister    Kidney Stones Sister    Depression Brother    Bipolar disorder Cousin    Bipolar disorder Cousin    Kidney cancer Neg Hx    Prostate cancer Neg Hx    Bladder Cancer Neg Hx     Social History   Socioeconomic History   Marital status: Married    Spouse name: Liliana Regulus   Number of children: Not on file   Years of education: Not on file   Highest education level: Some college, no degree  Occupational History   Not on file  Tobacco Use   Smoking status: Former    Current packs/day: 0.00    Average packs/day: 0.3 packs/day for 4.0 years (1.2 ttl pk-yrs)    Types: Cigarettes    Start date: 12/22/1979    Quit date: 12/22/1983    Years since quitting: 40.3   Smokeless tobacco: Never   Tobacco comments:    Former smoker 01/26/23  Vaping Use    Vaping status: Never Used  Substance and Sexual Activity   Alcohol use: Not Currently    Comment: 2x a year   Drug use: No   Sexual activity: Not on file  Other Topics Concern   Not on file  Social History Narrative   Married, for 14 years, first marriage for both her and husband. They have 2 cats.  Retired on 09/15/2023. Worked in [employer]  No kids.      Hobbies: classic movies, plays board games with a  cousin and friend once a week, loves to travel, reads Bible, does devotions.       Legal-no   Caffeine-1-2 Dr. Pepper per day   Religious-follower of Christ   Social Drivers of Health   Financial Resource Strain: Low Risk  (02/13/2024)   Overall Financial Resource Strain (CARDIA)    Difficulty of Paying Living Expenses: Not hard at all  Food Insecurity: No Food Insecurity (02/13/2024)   Hunger Vital Sign    Worried About Running Out of Food in the Last Year: Never true    Ran Out of Food in the Last Year: Never true  Transportation Needs: No Transportation Needs (02/13/2024)   PRAPARE - Administrator, Civil Service (Medical): No    Lack of Transportation (Non-Medical): No  Physical Activity: Inactive (02/13/2024)   Exercise Vital Sign    Days of Exercise per Week: 0 days    Minutes of Exercise per Session: 0 min  Stress: Stress Concern Present (02/13/2024)   [employer] of Occupational Health - Occupational Stress Questionnaire    Feeling of Stress : To some extent  Social Connections: Moderately Integrated (02/13/2024)   Social Connection and Isolation Panel [NHANES]    Frequency of Communication with Friends and Family: Twice a week    Frequency of Social Gatherings with Friends and Family: Once a week    Attends Religious Services: Never    Database administrator or Organizations: No    Attends Engineer, structural: More than 4 times per year    Marital Status: Married  Catering manager Violence: Not At Risk (10/05/2023)   Humiliation,  Afraid, Rape, and Kick questionnaire    Fear of Current or Ex-Partner: No    Emotionally Abused: No    Physically Abused: No    Sexually Abused: No   Review of Systems Some head hurting--feels full No sore throat Some left ear pain last night---"pinging" Some post nasal drip and drainage---does take the fluticasone  bid     Objective:   Physical Exam Constitutional:      Appearance: Normal appearance.  HENT:     Head:     Comments: No sinus tenderness    Right Ear: Tympanic membrane and ear canal normal.     Left Ear: Tympanic membrane and ear canal normal.     Mouth/Throat:     Pharynx: No oropharyngeal exudate or posterior oropharyngeal erythema.  Pulmonary:     Effort: Pulmonary effort is normal.     Breath sounds: No rales.     Comments: Fair air movement ?slight decreased breath sounds but clear Not tight or wheezy--but some tight cough Musculoskeletal:     Cervical back: Neck supple.  Lymphadenopathy:     Cervical: No cervical adenopathy.  Neurological:     Mental Status: She is alert.            Assessment & Plan:

## 2024-04-14 NOTE — Assessment & Plan Note (Signed)
 Seems to have had cough asthma since pneumonia in November Hasn't been using the symbicort regularly---urged her to start Can use the albuterol  prn  Will give prednisone  40mg  daily x 3, then 20mg  daily x 3 Sputum was brown--not blood----will cover with antibiotic (doxy)

## 2024-04-17 DIAGNOSIS — F4323 Adjustment disorder with mixed anxiety and depressed mood: Secondary | ICD-10-CM | POA: Diagnosis not present

## 2024-04-17 DIAGNOSIS — F4381 Prolonged grief disorder: Secondary | ICD-10-CM | POA: Diagnosis not present

## 2024-04-23 DIAGNOSIS — G4733 Obstructive sleep apnea (adult) (pediatric): Secondary | ICD-10-CM | POA: Diagnosis not present

## 2024-04-28 ENCOUNTER — Other Ambulatory Visit: Payer: Self-pay | Admitting: Nurse Practitioner

## 2024-04-30 ENCOUNTER — Other Ambulatory Visit (HOSPITAL_COMMUNITY): Payer: Self-pay | Admitting: Physician Assistant

## 2024-05-01 ENCOUNTER — Ambulatory Visit: Payer: 59 | Admitting: Orthopaedic Surgery

## 2024-05-01 ENCOUNTER — Other Ambulatory Visit: Payer: Self-pay | Admitting: Nurse Practitioner

## 2024-05-01 ENCOUNTER — Other Ambulatory Visit (HOSPITAL_COMMUNITY): Payer: Self-pay | Admitting: *Deleted

## 2024-05-01 MED ORDER — POTASSIUM CHLORIDE CRYS ER 20 MEQ PO TBCR: 20.0000 meq | EXTENDED_RELEASE_TABLET | Freq: Every day | ORAL | 6 refills | Status: DC

## 2024-05-08 ENCOUNTER — Encounter: Payer: Self-pay | Admitting: Orthopaedic Surgery

## 2024-05-08 ENCOUNTER — Ambulatory Visit: Admitting: Orthopaedic Surgery

## 2024-05-08 VITALS — Ht 64.5 in | Wt 382.0 lb

## 2024-05-08 DIAGNOSIS — M25561 Pain in right knee: Secondary | ICD-10-CM

## 2024-05-08 DIAGNOSIS — G8929 Other chronic pain: Secondary | ICD-10-CM | POA: Diagnosis not present

## 2024-05-08 DIAGNOSIS — M25562 Pain in left knee: Secondary | ICD-10-CM | POA: Diagnosis not present

## 2024-05-08 MED ORDER — METHYLPREDNISOLONE ACETATE 40 MG/ML IJ SUSP
40.0000 mg | INTRAMUSCULAR | Status: AC | PRN
  Administered 2024-05-08: 40 mg via INTRA_ARTICULAR

## 2024-05-08 MED ORDER — LIDOCAINE HCL 1 % IJ SOLN
3.0000 mL | INTRAMUSCULAR | Status: AC | PRN
  Administered 2024-05-08: 3 mL

## 2024-05-08 NOTE — Progress Notes (Signed)
 The patient comes in today for steroid injections in both her knees.  She does have significant pain and arthritis in both knees but is not a surgical candidate given her BMI.  Her last BMI 64.56 and her knees shows significant deformity.  Steroid injections lasted her about 2 months or so.  She has had no acute change in her medical status.  She is not diabetic.  Both knees were assessed today and have varus malalignment with no gross deformities of the known arthritic findings.  They both hurt throughout the arc of motion of the knees.  I did place a steroid injection in both knees today per her request.  She knows that this can be repeated in 3 months if needed.    Procedure Note  Patient: [name]             Date of Birth: March 09, 1961           MRN: [id number]             Visit Date: 05/08/2024  Procedures: Visit Diagnoses:  1. Chronic pain of right knee   2. Chronic pain of left knee     Large Joint Inj: R knee on 05/08/2024 3:45 PM Indications: diagnostic evaluation and pain Details: 22 G 1.5 in needle, superolateral approach  Arthrogram: No  Medications: 3 mL lidocaine  1 %; 40 mg methylPREDNISolone  acetate 40 MG/ML Outcome: tolerated well, no immediate complications Procedure, treatment alternatives, risks and benefits explained, specific risks discussed. Consent was given by the patient. Immediately prior to procedure a time out was called to verify the correct patient, procedure, equipment, support staff and site/side marked as required. Patient was prepped and draped in the usual sterile fashion.    Large Joint Inj: L knee on 05/08/2024 3:45 PM Indications: diagnostic evaluation and pain Details: 22 G 1.5 in needle, superolateral approach  Arthrogram: No  Medications: 3 mL lidocaine  1 %; 40 mg methylPREDNISolone  acetate 40 MG/ML Outcome: tolerated well, no immediate complications Procedure, treatment alternatives, risks and benefits explained, specific risks  discussed. Consent was given by the patient. Immediately prior to procedure a time out was called to verify the correct patient, procedure, equipment, support staff and site/side marked as required. Patient was prepped and draped in the usual sterile fashion.

## 2024-05-16 ENCOUNTER — Ambulatory Visit (INDEPENDENT_AMBULATORY_CARE_PROVIDER_SITE_OTHER): Payer: 59 | Admitting: Nurse Practitioner

## 2024-05-16 VITALS — BP 136/88 | HR 78 | Temp 98.2°F | Ht 64.5 in | Wt 373.4 lb

## 2024-05-16 DIAGNOSIS — E119 Type 2 diabetes mellitus without complications: Secondary | ICD-10-CM

## 2024-05-16 DIAGNOSIS — Z7984 Long term (current) use of oral hypoglycemic drugs: Secondary | ICD-10-CM

## 2024-05-16 DIAGNOSIS — I10 Essential (primary) hypertension: Secondary | ICD-10-CM

## 2024-05-16 DIAGNOSIS — M26609 Unspecified temporomandibular joint disorder, unspecified side: Secondary | ICD-10-CM | POA: Insufficient documentation

## 2024-05-16 DIAGNOSIS — F4381 Prolonged grief disorder: Secondary | ICD-10-CM | POA: Diagnosis not present

## 2024-05-16 DIAGNOSIS — F4323 Adjustment disorder with mixed anxiety and depressed mood: Secondary | ICD-10-CM | POA: Diagnosis not present

## 2024-05-16 LAB — POCT GLYCOSYLATED HEMOGLOBIN (HGB A1C): Hemoglobin A1C: 6.1 % — AB (ref 4.0–5.6)

## 2024-05-16 NOTE — Assessment & Plan Note (Signed)
 Conservative management this point.  She will continue following with her dentist.  States she has leftover cyclobenzaprine at home she can use

## 2024-05-16 NOTE — Assessment & Plan Note (Signed)
 Maintained on metformin  1000 mg twice daily does not check sugars at home.  A1c well-controlled at 6.1%.  Continue work on healthy lifestyle modifications continue taking metformin  as prescribed

## 2024-05-16 NOTE — Progress Notes (Signed)
 Established Patient Office Visit  Subjective   Patient ID: [name], female    DOB: 1961-06-20  Age: 63 y.o. MRN: [id number]  Chief Complaint  Patient presents with   Diabetes    Last diabetic eye exam was in December.     HPI  DM2: Patient currently maintained on metformin  1000 mg twice daily.  Currently does not check sugars at home. States that she feels like she will eat because she has medicine to take. State sthat she does not have an appetite and rarely feels hungry. States that she has lunch. States that she does not snack and has 2 meals a day  HTN: patient is currently maintained on lisinopril  10 mg, furosemide  40 mg, diltiazem  180 mg.  She is followed by cardiology.  TMJ: Patient states has been dealing with intermittently for some time, but most recently over the past couple weeks.  States that he was just seen by her dentist for cleaning and then reevaluation for some crowns with receding gums.  States she feels it in her right ear when she feels it.  Does have a history of cyclobenzaprine use for it as sometimes her CPAP mask but the cough portion of her face aggravates it.    Review of Systems  Constitutional:  Negative for chills and fever.  Respiratory:  Negative for shortness of breath.   Cardiovascular:  Negative for chest pain.  Gastrointestinal:  Negative for abdominal pain, blood in stool, melena, nausea and vomiting.       Bm several times a day   Neurological:  Negative for headaches.      Objective:     BP 136/88   Pulse 78   Temp 98.2 F (36.8 C) (Oral)   Ht 5' 4.5" (1.638 m)   Wt (!) 373 lb 6.4 oz (169.4 kg)   SpO2 96%   BMI 63.10 kg/m  BP Readings from Last 3 Encounters:  05/16/24 136/88  04/14/24 118/70  02/25/24 (!) 120/58   Wt Readings from Last 3 Encounters:  05/16/24 (!) 373 lb 6.4 oz (169.4 kg)  05/08/24 (!) 382 lb (173.3 kg)  04/14/24 (!) 382 lb (173.3 kg)   SpO2 Readings from Last 3 Encounters:  05/16/24 96%  04/14/24  96%  02/25/24 96%      Physical Exam Vitals and nursing note reviewed.  Constitutional:      Appearance: Normal appearance.  HENT:     Right Ear: Tympanic membrane, ear canal and external ear normal.     Left Ear: Tympanic membrane, ear canal and external ear normal.     Ears:     Comments: Clear fluid behind right TM    Mouth/Throat:     Mouth: Mucous membranes are moist.     Dentition: Dental caries present.  Cardiovascular:     Rate and Rhythm: Normal rate and regular rhythm.     Heart sounds: Normal heart sounds.  Pulmonary:     Effort: Pulmonary effort is normal.     Breath sounds: Normal breath sounds.  Lymphadenopathy:     Cervical: No cervical adenopathy.  Neurological:     Mental Status: She is alert.      Results for orders placed or performed in visit on 05/16/24  POCT glycosylated hemoglobin (Hb A1C)  Result Value Ref Range   Hemoglobin A1C 6.1 (A) 4.0 - 5.6 %   HbA1c POC (<> result, manual entry)     HbA1c, POC (prediabetic range)     HbA1c,  POC (controlled diabetic range)        The 10-year ASCVD risk score (Arnett DK, et al., 2019) is: 11.3%* (Cholesterol units were assumed)    Assessment & Plan:   Problem List Items Addressed This Visit       Cardiovascular and Mediastinum   Essential hypertension   Patient currently maintained on diltiazem  180 mg daily, furosemide  40 mg daily, lisinopril  10 mg daily.  Blood pressure well-controlled.  Continue medication as prescribed.  Patient is followed by cardiology        Endocrine   Type 2 diabetes mellitus without complication, without long-term current use of insulin  (HCC) - Primary   Maintained on metformin  1000 mg twice daily does not check sugars at home.  A1c well-controlled at 6.1%.  Continue work on healthy lifestyle modifications continue taking metformin  as prescribed      Relevant Orders   POCT glycosylated hemoglobin (Hb A1C) (Completed)     Musculoskeletal and Integument   TMJ  (temporomandibular joint syndrome)   Conservative management this point.  She will continue following with her dentist.  States she has leftover cyclobenzaprine at home she can use        Return in about 6 months (around 11/16/2024) for DM recheck.    Margarie Shay, NP

## 2024-05-16 NOTE — Patient Instructions (Signed)
 Nice to see you today A1C was 6.1% I want to see you in 6 months, sooner if you need me

## 2024-05-16 NOTE — Assessment & Plan Note (Signed)
 Patient currently maintained on diltiazem  180 mg daily, furosemide  40 mg daily, lisinopril  10 mg daily.  Blood pressure well-controlled.  Continue medication as prescribed.  Patient is followed by cardiology

## 2024-05-18 DIAGNOSIS — Z1231 Encounter for screening mammogram for malignant neoplasm of breast: Secondary | ICD-10-CM | POA: Diagnosis not present

## 2024-05-18 LAB — HM MAMMOGRAPHY

## 2024-05-23 DIAGNOSIS — F4323 Adjustment disorder with mixed anxiety and depressed mood: Secondary | ICD-10-CM | POA: Diagnosis not present

## 2024-05-23 DIAGNOSIS — F4381 Prolonged grief disorder: Secondary | ICD-10-CM | POA: Diagnosis not present

## 2024-05-27 ENCOUNTER — Other Ambulatory Visit: Payer: Self-pay | Admitting: Nurse Practitioner

## 2024-05-28 ENCOUNTER — Encounter: Payer: Self-pay | Admitting: Nurse Practitioner

## 2024-05-28 DIAGNOSIS — G4733 Obstructive sleep apnea (adult) (pediatric): Secondary | ICD-10-CM | POA: Diagnosis not present

## 2024-05-29 MED ORDER — FUROSEMIDE 40 MG PO TABS: 40.0000 mg | ORAL_TABLET | Freq: Every day | ORAL | 1 refills | Status: DC

## 2024-05-29 MED ORDER — LEVOTHYROXINE SODIUM 50 MCG PO TABS: 50.0000 ug | ORAL_TABLET | Freq: Every day | ORAL | 1 refills | Status: DC

## 2024-05-31 DIAGNOSIS — F4323 Adjustment disorder with mixed anxiety and depressed mood: Secondary | ICD-10-CM | POA: Diagnosis not present

## 2024-05-31 DIAGNOSIS — F4381 Prolonged grief disorder: Secondary | ICD-10-CM | POA: Diagnosis not present

## 2024-06-12 DIAGNOSIS — F4381 Prolonged grief disorder: Secondary | ICD-10-CM | POA: Diagnosis not present

## 2024-06-12 DIAGNOSIS — F4323 Adjustment disorder with mixed anxiety and depressed mood: Secondary | ICD-10-CM | POA: Diagnosis not present

## 2024-06-20 DIAGNOSIS — F4381 Prolonged grief disorder: Secondary | ICD-10-CM | POA: Diagnosis not present

## 2024-06-20 DIAGNOSIS — F4323 Adjustment disorder with mixed anxiety and depressed mood: Secondary | ICD-10-CM | POA: Diagnosis not present

## 2024-06-26 ENCOUNTER — Ambulatory Visit: Admitting: Cardiology

## 2024-06-27 DIAGNOSIS — F4381 Prolonged grief disorder: Secondary | ICD-10-CM | POA: Diagnosis not present

## 2024-06-29 ENCOUNTER — Ambulatory Visit: Attending: Cardiology | Admitting: Cardiology

## 2024-06-29 ENCOUNTER — Encounter: Payer: Self-pay | Admitting: Cardiology

## 2024-06-29 VITALS — BP 132/62 | HR 90 | Ht 64.5 in | Wt 383.2 lb

## 2024-06-29 DIAGNOSIS — R5383 Other fatigue: Secondary | ICD-10-CM

## 2024-06-29 DIAGNOSIS — I4819 Other persistent atrial fibrillation: Secondary | ICD-10-CM

## 2024-06-29 DIAGNOSIS — Z5181 Encounter for therapeutic drug level monitoring: Secondary | ICD-10-CM | POA: Diagnosis not present

## 2024-06-29 DIAGNOSIS — D6869 Other thrombophilia: Secondary | ICD-10-CM | POA: Diagnosis not present

## 2024-06-29 DIAGNOSIS — Z79899 Other long term (current) drug therapy: Secondary | ICD-10-CM | POA: Diagnosis not present

## 2024-06-29 NOTE — Patient Instructions (Signed)
 Medication Instructions:  The current medical regimen is effective;  continue present plan and medications as directed. Please refer to the Current Medication list given to you today.   *If you need a refill on your cardiac medications before your next appointment, please call your pharmacy*  Lab Work: Your provider would like for you to have following labs drawn today CBC, BMET, MAG, TSH, T4, IRON  PANEL.   If you have labs (blood work) drawn today and your tests are completely normal, you will receive your results only by: MyChart Message (if you have MyChart) OR A paper copy in the mail If you have any lab test that is abnormal or we need to change your treatment, we will call you to review the results.  Follow-Up: At [facility], you and your health needs are our priority.  As part of our continuing mission to provide you with exceptional heart care, our providers are all part of one team.  This team includes your primary Cardiologist (physician) and Advanced Practice Providers or APPs (Physician Assistants and Nurse Practitioners) who all work together to provide you with the care you need, when you need it.  Your next appointment:   3 month(s)  Provider:   Ole Holts, MD or Suzann Riddle, NP    We recommend signing up for the patient portal called MyChart.  Sign up information is provided on this After Visit Summary.  MyChart is used to connect with patients for Virtual Visits (Telemedicine).  Patients are able to view lab/test results, encounter notes, upcoming appointments, etc.  Non-urgent messages can be sent to your provider as well.   To learn more about what you can do with MyChart, go to [email or web address].

## 2024-06-29 NOTE — Progress Notes (Signed)
 Electrophysiology Clinic Note    Date:  06/29/2024  Patient ID:  [name], [name] 1961-04-08, MRN [id number] PCP:  Wendee Lynwood HERO, NP  Cardiologist:  Lonni Hanson, MD Electrophysiologist: OLE ONEIDA HOLTS, MD   Discussed the use of AI scribe software for clinical note transcription with the patient, who gave verbal consent to proceed.   Patient Profile    Chief Complaint: tikosyn  follow-up  History of Present Illness: [name] is a 63 y.o. female with PMH notable for persis Afib, HFpEF, T2DM, OSA, HTN, obesity ; seen today for OLE ONEIDA HOLTS, MD for routine electrophysiology followup.   I last saw her 02/2024 where she was doing well and maintaining sinus rhythm.   On follow-up today, she does not think she has had any AFib episodes. She is experiencing severe fatigue and exhaustion and is not sure why. She experiences intermittent head 'pings' described as nerve-related pains. Emotional distress is significant, with long naps during the day and exhaustion after simple activities.  She continues to take tikosyn  and eliquis  every 12 hours without missing doses. She denies bleeding concerns.     AAD History: Tikosyn  - loaded 02/2023    ROS:  Please see the history of present illness. All other systems are reviewed and otherwise negative.    Physical Exam    VS:  BP 132/62   Pulse 90   Ht 5' 4.5 (1.638 m)   Wt (!) 383 lb 3.2 oz (173.8 kg)   SpO2 96%   BMI 64.76 kg/m  BMI: Body mass index is 64.76 kg/m.  Wt Readings from Last 3 Encounters:  06/29/24 (!) 383 lb 3.2 oz (173.8 kg)  05/16/24 (!) 373 lb 6.4 oz (169.4 kg)  05/08/24 (!) 382 lb (173.3 kg)     GEN- The patient is well appearing, alert and oriented x 3 today.   Lungs- Clear to ausculation bilaterally, normal work of breathing.  Heart- Regular rate and rhythm, no murmurs, rubs or gallops Extremities- Trace peripheral edema - difficult to assess d/t body habitus, warm, dry    Studies Reviewed    Previous EP, cardiology notes.    EKG is ordered. Personal review of EKG from today shows:   EKG Interpretation Date/Time:  Thursday June 29 2024 14:57:03 EDT Ventricular Rate:  90 PR Interval:  138 QRS Duration:  76 QT Interval:  368 QTC Calculation: 450 R Axis:   68  Text Interpretation: Normal sinus rhythm Normal ECG Confirmed by Ornella Coderre [phone] on 06/29/2024 2:59:02 PM     02/2024 EKG - SR at 78bpm, RAD QT 402, QTC 458   11/25/2023 EKG - SR at 87bpm, RAD QT , QTC 480    TTE, 01/05/2023  1. Left ventricular ejection fraction, by estimation, is 60 to 65%. The left ventricle has normal function. Left ventricular endocardial border not optimally defined to evaluate regional wall motion. The left ventricular internal cavity size was mildly dilated. There is mild left ventricular hypertrophy. Left ventricular diastolic parameters were normal.   2. Right ventricular systolic function is normal. The right ventricular size is mildly enlarged. There is moderately elevated pulmonary artery systolic pressure.   3. Right atrial size was mildly dilated.   4. The mitral valve is grossly normal. Trivial mitral valve regurgitation.   5. The aortic valve was not well visualized. Aortic valve regurgitation is not visualized. No aortic stenosis is present.   6. The inferior vena cava is dilated in size with <50% respiratory  variability, suggesting right atrial pressure of 15 mmHg.   Coronary CTA, 01/04/2023 1. Normal coronary calcium  score of 0.  Patient is low risk.  2. Normal coronary origin with right dominance.  3. No evidence of CAD.  4. CAD-RADS 0. Consider non-atherosclerotic causes of chest pain.  5. Image quality degraded by obesity related artifacts.  TTE, 08/30/2020  1. Left ventricular ejection fraction, by estimation, is 60 to 65%. The left ventricle has normal function. The left ventricle has no regional wall motion abnormalities. Left ventricular diastolic parameters  were normal.   2. Right ventricular systolic function is normal. The right ventricular size is normal. Tricuspid regurgitation signal is inadequate for assessing PA pressure.   3. Left atrial size was mildly dilated.   4. Right atrial size was mildly dilated.   5. Mild mitral valve regurgitation.     Assessment and Plan     #) persis AFib #) high risk medication use - tikosyn  Maintaining sinus EKG with stable QTC  Continue tikosyn  every 12 hours Update BMP, Mag  #) Hypercoag d/t persis afib CHA2DS2-VASc Score = at least 4 [CHF History: 1, HTN History: 1, Diabetes History: 1, Stroke History: 0, Vascular Disease History: 0, Age Score: 0, Gender Score: 1].  Therefore, the patient's annual risk of stroke is 4.8 %.    Stroke ppx - 5mg  eliquis  BID, appropriately dosed No bleeding concerns Update CBC  #) fatigue Unclear cause of patient's fatigue Will update labs as above, and thryoid and iron  panel I also recommended she discuss with her PCP and therapist as she has significant depression history    Current medicines are reviewed at length with the patient today.   The patient does not have concerns regarding her medicines.  The following changes were made today:  none  Labs/ tests ordered today include:  Orders Placed This Encounter  Procedures   CBC   Basic metabolic panel with GFR   Magnesium    TSH   T4, free   Iron , TIBC and Ferritin Panel   EKG 12-Lead     Disposition: Follow up with Dr. Cindie or EP APP in 3-4 months    Signed, Keivon Garden, NP  06/29/24  4:48 PM  Electrophysiology [facility]

## 2024-06-30 ENCOUNTER — Ambulatory Visit: Payer: Self-pay | Admitting: Cardiology

## 2024-06-30 LAB — BASIC METABOLIC PANEL WITH GFR
BUN/Creatinine Ratio: 17 (ref 12–28)
BUN: 10 mg/dL (ref 8–27)
CO2: 22 mmol/L (ref 20–29)
Calcium: 9.7 mg/dL (ref 8.7–10.3)
Chloride: 95 mmol/L — ABNORMAL LOW (ref 96–106)
Creatinine, Ser: 0.6 mg/dL (ref 0.57–1.00)
Glucose: 118 mg/dL — ABNORMAL HIGH (ref 70–99)
Potassium: 4.3 mmol/L (ref 3.5–5.2)
Sodium: 139 mmol/L (ref 134–144)
eGFR: 101 mL/min/1.73 (ref >59)

## 2024-06-30 LAB — CBC
Hematocrit: 41 % (ref 34.0–46.6)
Hemoglobin: 13.1 g/dL (ref 11.1–15.9)
MCH: 29.4 pg (ref 26.6–33.0)
MCHC: 32 g/dL (ref 31.5–35.7)
MCV: 92 fL (ref 79–97)
Platelets: 355 x10E3/uL (ref 150–450)
RBC: 4.45 x10E6/uL (ref 3.77–5.28)
RDW: 13.7 % (ref 11.7–15.4)
WBC: 15.2 x10E3/uL — ABNORMAL HIGH (ref 3.4–10.8)

## 2024-06-30 LAB — IRON,TIBC AND FERRITIN PANEL
Ferritin: 101 ng/mL (ref 15–150)
Iron Saturation: 16 % (ref 15–55)
Iron: 52 ug/dL (ref 27–139)
Total Iron Binding Capacity: 332 ug/dL (ref 250–450)
UIBC: 280 ug/dL (ref 118–369)

## 2024-06-30 LAB — MAGNESIUM: Magnesium: 1.9 mg/dL (ref 1.6–2.3)

## 2024-06-30 LAB — TSH: TSH: 1.75 u[IU]/mL (ref 0.450–4.500)

## 2024-06-30 LAB — T4, FREE: Free T4: 1.3 ng/dL (ref 0.82–1.77)

## 2024-07-04 DIAGNOSIS — F4381 Prolonged grief disorder: Secondary | ICD-10-CM | POA: Diagnosis not present

## 2024-07-10 ENCOUNTER — Ambulatory Visit: Admitting: Physician Assistant

## 2024-07-11 ENCOUNTER — Encounter: Payer: Self-pay | Admitting: Physician Assistant

## 2024-07-11 ENCOUNTER — Other Ambulatory Visit: Payer: Self-pay | Admitting: Nurse Practitioner

## 2024-07-11 ENCOUNTER — Ambulatory Visit (INDEPENDENT_AMBULATORY_CARE_PROVIDER_SITE_OTHER): Admitting: Physician Assistant

## 2024-07-11 DIAGNOSIS — F411 Generalized anxiety disorder: Secondary | ICD-10-CM | POA: Diagnosis not present

## 2024-07-11 DIAGNOSIS — F3341 Major depressive disorder, recurrent, in partial remission: Secondary | ICD-10-CM

## 2024-07-11 DIAGNOSIS — F4310 Post-traumatic stress disorder, unspecified: Secondary | ICD-10-CM | POA: Diagnosis not present

## 2024-07-11 DIAGNOSIS — F4381 Prolonged grief disorder: Secondary | ICD-10-CM | POA: Diagnosis not present

## 2024-07-11 MED ORDER — CLONAZEPAM 0.5 MG PO TABS: 0.2500 mg | ORAL_TABLET | Freq: Three times a day (TID) | ORAL | 1 refills | Status: DC | PRN

## 2024-07-11 NOTE — Progress Notes (Signed)
 [facility]  Patient ID: [name],  MRN: [id number]  PCP: Wendee Lynwood HERO, NP  Date of Evaluation: 07/11/2024 Time spent:25 minutes  Chief Complaint:  Chief Complaint   Anxiety; Depression; Follow-up   Virtual Visit via Telehealth  I connected with patient by  telephone, with their informed consent, and verified patient privacy and that I am speaking with the correct person using two identifiers.  I am private, in my office and the patient is at home.  I discussed the limitations, risks, security and privacy concerns of performing an evaluation and management service by telephone and the availability of in person appointments. I also discussed with the patient that there may be a patient responsible charge related to this service. The patient expressed understanding and agreed to proceed.   I discussed the assessment and treatment plan with the patient. The patient was provided an opportunity to ask questions and all were answered. The patient agreed with the plan and demonstrated an understanding of the instructions.   The patient was advised to call back or seek an in-person evaluation if the symptoms worsen or if the condition fails to improve as anticipated.  I provided approximately 25 minutes of non-face-to-face time during this encounter.  HISTORY/CURRENT STATUS: HPI   For routine med check  Doing 'ok.' Hard to say how well her mental health meds are working d/t stressors in her life.  Doesn't think anything should be changed though. Knows a lot of it is situational, she's working through things with her counselor. Her faith is helpful first and foremost.  Patient is able to enjoy things.  Energy and motivation are fair to good, depending on the day.  No extreme sadness, tearfulness, or feelings of hopelessness.  Sleeps ok.  ADLs and personal hygiene are normal.   Denies any changes in concentration, making decisions, or remembering things.  Appetite has not  changed.  Weight is stable.  Anxiety is controlled, needs the Klonopin  sometimes and it is helpful.  No mania, delirium, AH/VH.  No SI/HI.  Denies dizziness, syncope, seizures, numbness, tingling, tremor, tics, unsteady gait, slurred speech, confusion. Denies muscle or joint pain, stiffness, or dystonia.  Individual Medical History/ Review of Systems: Changes? :No      Past meds include Klonopin , Xanax, Cymbalta , Zoloft had to be discontinued due to medication for A-fib, lamotrigine    Allergies: Tape and Erythromycin  Current Medications:  Current Outpatient Medications:    albuterol  (VENTOLIN  HFA) 108 (90 Base) MCG/ACT inhaler, TAKE 2 PUFFS BY MOUTH EVERY 6 HOURS AS NEEDED FOR WHEEZE OR SHORTNESS OF BREATH, Disp: 18 each, Rfl: 1   budesonide -formoterol  (SYMBICORT ) 160-4.5 MCG/ACT inhaler, Inhale 2 puffs into the lungs in the morning and at bedtime., Disp: 1 each, Rfl: 3   Cholecalciferol 125 MCG (5000 UT) CHEW, Chew 5,000 Units by mouth daily., Disp: , Rfl:    diltiazem  (CARDIZEM  CD) 180 MG 24 hr capsule, Take 1 capsule (180 mg total) by mouth daily., Disp: 90 capsule, Rfl: 3   dofetilide  (TIKOSYN ) 500 MCG capsule, Take 1 capsule (500 mcg total) by mouth 2 (two) times daily., Disp: 180 capsule, Rfl: 1   DULoxetine  (CYMBALTA ) 30 MG capsule, Take 3 capsules (90 mg total) by mouth daily., Disp: 270 capsule, Rfl: 1   ELIQUIS  5 MG TABS tablet, TAKE 1 TABLET BY MOUTH EVERY 12 HOURS, Disp: 60 tablet, Rfl: 5   famotidine (PEPCID) 20 MG tablet, Take 20 mg by mouth 2 (two) times daily., Disp: , Rfl:  Ferrous Sulfate  (IRON ) 325 (65 Fe) MG TABS, Take 1 tablet (325 mg total) by mouth daily., Disp: 30 tablet, Rfl: 1   fexofenadine (ALLEGRA) 180 MG tablet, Take 180 mg by mouth daily., Disp: , Rfl:    fluticasone  (FLONASE ) 50 MCG/ACT nasal spray, Place 2 sprays into both nostrils daily., Disp: 16 g, Rfl: 5   furosemide  (LASIX ) 40 MG tablet, Take 1 tablet (40 mg total) by mouth daily for 1 day., Disp: 90  tablet, Rfl: 1   levothyroxine  (SYNTHROID ) 50 MCG tablet, Take 1 tablet (50 mcg total) by mouth daily before breakfast., Disp: 90 tablet, Rfl: 1   lisinopril  (ZESTRIL ) 10 MG tablet, TAKE 1 TABLET BY MOUTH EVERY DAY, Disp: 30 tablet, Rfl: 11   magnesium  oxide (MAG-OX) 400 (240 Mg) MG tablet, TAKE 1 TABLET BY MOUTH TWICE A DAY, Disp: 180 tablet, Rfl: 2   metFORMIN  (GLUCOPHAGE ) 500 MG tablet, Take 2 tablets (1,000 mg total) by mouth 2 (two) times daily., Disp: 120 tablet, Rfl: 5   montelukast  (SINGULAIR ) 10 MG tablet, Take 10 mg by mouth at bedtime., Disp: , Rfl:    Olopatadine  HCl (PATADAY  OP), Place 1 drop into both eyes daily as needed (itchy eyes)., Disp: , Rfl:    potassium chloride  SA (KLOR-CON  M20) 20 MEQ tablet, Take 1 tablet (20 mEq total) by mouth daily., Disp: 30 tablet, Rfl: 6   Prenatal Vit-Fe Fumarate-FA (M-NATAL PLUS) 27-1 MG TABS, Take 1 tablet by mouth daily., Disp: , Rfl:    UNABLE TO FIND, CPAP, Disp: , Rfl:    clonazePAM  (KLONOPIN ) 0.5 MG tablet, Take 0.5-1 tablets (0.25-0.5 mg total) by mouth 3 (three) times daily as needed for anxiety., Disp: 90 tablet, Rfl: 1 Medication Side Effects: none  Family Medical/ Social History: Changes? No  MENTAL HEALTH EXAM:  There were no vitals taken for this visit.There is no height or weight on file to calculate BMI.  General Appearance: unable to assess  Eye Contact:  unable to assess  Speech:  Clear and Coherent and Normal Rate  Volume:  Normal  Mood:  Euthymic  Affect:  unable to assess  Thought Process:  Goal Directed and Descriptions of Associations: Circumstantial  Orientation:  Full (Time, Place, and Person)  Thought Content: Logical   Suicidal Thoughts:  No  Homicidal Thoughts:  No  Memory:  WNL  Judgement:  Good  Insight:  Good  Psychomotor Activity:  unable to assess  Concentration:  Concentration: Good and Attention Span: Good  Recall:  Good  Fund of Knowledge: Good  Language: Good  Assets:  Communication  Skills Desire for Improvement Financial Resources/Insurance Housing Transportation  ADL's:  Intact  Cognition: WNL  Prognosis:  Good   DIAGNOSES:    ICD-10-CM   1. Recurrent major depression in partial remission (HCC)  F33.41     2. Generalized anxiety disorder  F41.1     3. PTSD (post-traumatic stress disorder)  F43.10       Receiving Psychotherapy: Yes  [name], trauma specialist She saw Marval Bunde, LCSW in the past  RECOMMENDATIONS:  PDMP reviewed.  Klonopin  filled 07/04/2024.   I provided approximately 25  minutes of non-face-to-face time during this encounter, including time spent before and after the visit in records review, medical decision making, counseling pertinent to today's visit, and charting.   As far as her medications go, she's doing well so no changes will be made.   Continue Klonopin  0.5 mg, 1/2-1 p.o. 3 times daily as needed anxiety. Continue  Cymbalta  90 mg daily. Continue therapy with Michelle Gallamore. Return in 3 months.  Verneita Cooks, PA-C

## 2024-07-17 ENCOUNTER — Encounter: Payer: Self-pay | Admitting: Nurse Practitioner

## 2024-07-17 MED ORDER — IRON 325 (65 FE) MG PO TABS: 325.0000 mg | ORAL_TABLET | Freq: Every day | ORAL | 5 refills | Status: DC

## 2024-07-18 DIAGNOSIS — F4381 Prolonged grief disorder: Secondary | ICD-10-CM | POA: Diagnosis not present

## 2024-07-26 DIAGNOSIS — F4381 Prolonged grief disorder: Secondary | ICD-10-CM | POA: Diagnosis not present

## 2024-07-31 DIAGNOSIS — F4381 Prolonged grief disorder: Secondary | ICD-10-CM | POA: Diagnosis not present

## 2024-08-01 ENCOUNTER — Ambulatory Visit (INDEPENDENT_AMBULATORY_CARE_PROVIDER_SITE_OTHER): Admitting: Nurse Practitioner

## 2024-08-01 VITALS — BP 142/80 | HR 86 | Temp 99.2°F | Ht 64.5 in | Wt 386.4 lb

## 2024-08-01 DIAGNOSIS — M2550 Pain in unspecified joint: Secondary | ICD-10-CM | POA: Insufficient documentation

## 2024-08-01 DIAGNOSIS — M791 Myalgia, unspecified site: Secondary | ICD-10-CM | POA: Diagnosis not present

## 2024-08-01 DIAGNOSIS — R519 Headache, unspecified: Secondary | ICD-10-CM | POA: Insufficient documentation

## 2024-08-01 DIAGNOSIS — M26609 Unspecified temporomandibular joint disorder, unspecified side: Secondary | ICD-10-CM | POA: Diagnosis not present

## 2024-08-01 DIAGNOSIS — R443 Hallucinations, unspecified: Secondary | ICD-10-CM | POA: Insufficient documentation

## 2024-08-01 DIAGNOSIS — Z711 Person with feared health complaint in whom no diagnosis is made: Secondary | ICD-10-CM | POA: Insufficient documentation

## 2024-08-01 DIAGNOSIS — R5383 Other fatigue: Secondary | ICD-10-CM | POA: Insufficient documentation

## 2024-08-01 LAB — URINALYSIS, ROUTINE W REFLEX MICROSCOPIC
Bilirubin Urine: NEGATIVE
Ketones, ur: NEGATIVE
Leukocytes,Ua: NEGATIVE
Nitrite: NEGATIVE
Specific Gravity, Urine: 1.015 (ref 1.000–1.030)
Total Protein, Urine: NEGATIVE
Urine Glucose: NEGATIVE
Urobilinogen, UA: 0.2 (ref 0.0–1.0)
pH: 6 (ref 5.0–8.0)

## 2024-08-01 LAB — CBC WITH DIFFERENTIAL/PLATELET
Basophils Absolute: 0.1 K/uL (ref 0.0–0.1)
Basophils Relative: 0.7 % (ref 0.0–3.0)
Eosinophils Absolute: 0.2 K/uL (ref 0.0–0.7)
Eosinophils Relative: 1.8 % (ref 0.0–5.0)
HCT: 37.2 % (ref 36.0–46.0)
Hemoglobin: 12.5 g/dL (ref 12.0–15.0)
Lymphocytes Relative: 21.3 % (ref 12.0–46.0)
Lymphs Abs: 2.3 K/uL (ref 0.7–4.0)
MCHC: 33.6 g/dL (ref 30.0–36.0)
MCV: 87.9 fl (ref 78.0–100.0)
Monocytes Absolute: 0.8 K/uL (ref 0.1–1.0)
Monocytes Relative: 7.8 % (ref 3.0–12.0)
Neutro Abs: 7.2 K/uL (ref 1.4–7.7)
Neutrophils Relative %: 68.4 % (ref 43.0–77.0)
Platelets: 321 K/uL (ref 150.0–400.0)
RBC: 4.23 Mil/uL (ref 3.87–5.11)
RDW: 15 % (ref 11.5–15.5)
WBC: 10.6 K/uL — ABNORMAL HIGH (ref 4.0–10.5)

## 2024-08-01 LAB — VITAMIN B12: Vitamin B-12: 513 pg/mL (ref 211–911)

## 2024-08-01 LAB — CK: Total CK: 58 U/L (ref 17–177)

## 2024-08-01 LAB — VITAMIN D 25 HYDROXY (VIT D DEFICIENCY, FRACTURES): VITD: 33.03 ng/mL (ref 30.00–100.00)

## 2024-08-01 MED ORDER — CYCLOBENZAPRINE HCL 5 MG PO TABS
5.0000 mg | ORAL_TABLET | Freq: Every evening | ORAL | 0 refills | Status: DC | PRN
Start: 2024-08-01 — End: 2024-11-01

## 2024-08-01 NOTE — Assessment & Plan Note (Signed)
 Ambiguous in nature TSH and recently checked will check B12 and vitamin D 

## 2024-08-01 NOTE — Assessment & Plan Note (Signed)
 Ambiguous in nature will check CBC along with electrolytes and CK.  Patient is not on a statin medication

## 2024-08-01 NOTE — Assessment & Plan Note (Signed)
 [name] administered in office patient scored 30/30.  Pending B12 and CT scan of head

## 2024-08-01 NOTE — Assessment & Plan Note (Signed)
 Will check ANA.  If negative can also consider alternative diagnosis of fibromyalgia but will likely need to see rheumatology prior to making diagnosis

## 2024-08-01 NOTE — Assessment & Plan Note (Signed)
 Patient is followed by behavioral health specialist currently maintained on duloxetine  90 mg daily.  Benign hallucinations obtain CT scan of head to rule out mass

## 2024-08-01 NOTE — Assessment & Plan Note (Signed)
 Recent increased headaches with hallucinations pending CT scan to rule out mass

## 2024-08-01 NOTE — Assessment & Plan Note (Signed)
 Patient is cyclobenzaprine  5 mg nightly as needed.  Refill provided.  Sedation precautions reviewed

## 2024-08-01 NOTE — Progress Notes (Signed)
 Acute Office Visit  Subjective:     Patient ID: [name], female    DOB: 1961/07/20, 63 y.o.   MRN: [id number]  Chief Complaint  Patient presents with   discuss lab work    Pt complains of ongoing extreme fatigue, muscle and joint soreness and pain with weakness in legs, loss of appetite, and sporadic confusion. Pt requests for additional lab work.  Symptoms started In April.     HPI Patient is in today for multiple complaints with a history of a-fib, htn, chf, OSA, asthma, GERD, Dm2, hypothyroidism, vitmain D def, depressoin   States that she has been having symptoms since April She has been having fatigue, muscle and joint soreness, back pain, sporadic confusion and hallucinations, loss of appetite, diff swallowing with eating food when she is not hungry. She will take long naps of 4+ hours.   Had a follow chrys Cooks in July and she was not ok with her mood   States that she is not on any cholesterol medications. States that the muscel aches and pains has been steady. States that nothing she notices that make sit better or worse.  She is still wearing her CPAP  Mood: states that she is on duloxetine  90mg  daily and is followed by ONEIDA Cooks. States that she went ot her trauma counseling. When she got to the appoitment there was a blue car on the other isde. States that when she leaves the lot looks empty. Statest when shwe go in her car she noticed the other vehicles.  States they have 2 cats. States that there were times when the cats are in but they were outside. She will see them in the room with her and they are not  States that she thought she so a perosn walk up the ramp at her office and they did not walk back down and no one was there      08/01/2024   11:42 AM 05/16/2024    8:23 AM 04/14/2024    9:02 AM  PHQ9 SCORE ONLY  PHQ-9 Total Score 13 9 11        08/01/2024   11:43 AM 05/16/2024    8:23 AM 02/17/2024    1:36 PM  GAD 7 : Generalized Anxiety Score  Nervous,  Anxious, on Edge 0 1 1  Control/stop worrying 0 0 0  Worry too much - different things 0 0 0  Trouble relaxing 0 0 1  Restless 0 0 0  Easily annoyed or irritable 0 0 0  Afraid - awful might happen 0 0 0  Total GAD 7 Score 0 1 2  Anxiety Difficulty Not difficult at all Somewhat difficult Somewhat difficult      Review of Systems  Constitutional:  Positive for malaise/fatigue. Negative for chills and fever.  Respiratory:  Negative for shortness of breath.   Cardiovascular:  Negative for chest pain.  Genitourinary:  Negative for dysuria, frequency and hematuria.  Neurological:  Positive for headaches.  Psychiatric/Behavioral:  Positive for hallucinations. Negative for suicidal ideas. The patient does not have insomnia.         Objective:    BP (!) 142/80   Pulse 86   Temp 99.2 F (37.3 C) (Oral)   Ht 5' 4.5 (1.638 m)   Wt (!) 386 lb 6.4 oz (175.3 kg)   SpO2 96%   BMI 65.30 kg/m    Physical Exam Vitals and nursing note reviewed.  Constitutional:      Appearance: Normal  appearance.  Cardiovascular:     Rate and Rhythm: Normal rate and regular rhythm.     Heart sounds: Normal heart sounds.  Pulmonary:     Effort: Pulmonary effort is normal.     Breath sounds: Normal breath sounds.  Abdominal:     General: Bowel sounds are normal. There is no distension.     Tenderness: There is no abdominal tenderness.  Lymphadenopathy:     Cervical: No cervical adenopathy.  Neurological:     General: No focal deficit present.     Mental Status: She is alert.     Comments: Bilateral upper and lower extremity strength 5/5     Results for orders placed or performed in visit on 08/01/24  CBC with Differential/Platelet  Result Value Ref Range   WBC 10.6 (H) 4.0 - 10.5 K/uL   RBC 4.23 3.87 - 5.11 Mil/uL   Hemoglobin 12.5 12.0 - 15.0 g/dL   HCT 62.7 63.9 - 53.9 %   MCV 87.9 78.0 - 100.0 fl   MCHC 33.6 30.0 - 36.0 g/dL   RDW 84.9 88.4 - 84.4 %   Platelets 321.0 150.0 - 400.0  K/uL   Neutrophils Relative % 68.4 43.0 - 77.0 %   Lymphocytes Relative 21.3 12.0 - 46.0 %   Monocytes Relative 7.8 3.0 - 12.0 %   Eosinophils Relative 1.8 0.0 - 5.0 %   Basophils Relative 0.7 0.0 - 3.0 %   Neutro Abs 7.2 1.4 - 7.7 K/uL   Lymphs Abs 2.3 0.7 - 4.0 K/uL   Monocytes Absolute 0.8 0.1 - 1.0 K/uL   Eosinophils Absolute 0.2 0.0 - 0.7 K/uL   Basophils Absolute 0.1 0.0 - 0.1 K/uL  Vitamin B12  Result Value Ref Range   Vitamin B-12 513 211 - 911 pg/mL  VITAMIN D  25 Hydroxy (Vit-D Deficiency, Fractures)  Result Value Ref Range   VITD 33.03 30.00 - 100.00 ng/mL  Urinalysis, Routine w reflex microscopic  Result Value Ref Range   Color, Urine YELLOW Yellow;Lt. Yellow;Straw;Dark Yellow;Amber;Green;Red;Brown   APPearance CLEAR Clear;Turbid;Slightly Cloudy;Cloudy   Specific Gravity, Urine 1.015 1.000 - 1.030   pH 6.0 5.0 - 8.0   Total Protein, Urine NEGATIVE Negative   Urine Glucose NEGATIVE Negative   Ketones, ur NEGATIVE Negative   Bilirubin Urine NEGATIVE Negative   Hgb urine dipstick SMALL (A) Negative   Urobilinogen, UA 0.2 0.0 - 1.0   Leukocytes,Ua NEGATIVE Negative   Nitrite NEGATIVE Negative   WBC, UA 0-2/hpf 0-2/hpf   RBC / HPF 3-6/hpf (A) 0-2/hpf   Squamous Epithelial / HPF Rare(0-4/hpf) Rare(0-4/hpf)   Bacteria, UA Rare(<10/hpf) (A) None  CK  Result Value Ref Range   Total CK 58 17 - 177 U/L        Assessment & Plan:   Problem List Items Addressed This Visit       Musculoskeletal and Integument   TMJ (temporomandibular joint syndrome)   Patient is cyclobenzaprine  5 mg nightly as needed.  Refill provided.  Sedation precautions reviewed      Relevant Medications   cyclobenzaprine  (FLEXERIL ) 5 MG tablet     Other   Myalgia - Primary   Ambiguous in nature will check CBC along with electrolytes and CK.  Patient is not on a statin medication      Relevant Orders   CK (Completed)   Multiple joint pain   Will check ANA.  If negative can also consider  alternative diagnosis of fibromyalgia but will likely need to see rheumatology  prior to making diagnosis      Relevant Orders   ANA Screen,IFA,Reflex Titer/Pattern,Reflex Mplx 11 Ab Cascade with IdentRA   Concern about memory   Damien C administered in office patient scored 30/30.  Pending B12 and CT scan of head      Relevant Orders   Urinalysis, Routine w reflex microscopic (Completed)   CT HEAD WO CONTRAST ( )   Hallucinations   Patient is followed by behavioral health specialist currently maintained on duloxetine  90 mg daily.  Benign hallucinations obtain CT scan of head to rule out mass      Relevant Orders   Urinalysis, Routine w reflex microscopic (Completed)   CT HEAD WO CONTRAST ( )   Frequent headaches   Recent increased headaches with hallucinations pending CT scan to rule out mass      Relevant Medications   cyclobenzaprine  (FLEXERIL ) 5 MG tablet   Other Relevant Orders   CT HEAD WO CONTRAST ( )   Fatigue   Ambiguous in nature TSH and recently checked will check B12 and vitamin D       Relevant Orders   CBC with Differential/Platelet (Completed)   Vitamin B12 (Completed)   VITAMIN D  25 Hydroxy (Vit-D Deficiency, Fractures) (Completed)    Meds ordered this encounter  Medications   cyclobenzaprine  (FLEXERIL ) 5 MG tablet    Sig: Take 1 tablet (5 mg total) by mouth at bedtime as needed.    Dispense:  20 tablet    Refill:  0    Supervising Provider:   RANDEEN HARDY A [[id number]]    Return if symptoms worsen or fail to improve, for As scheduled .  Adina Crandall, NP

## 2024-08-01 NOTE — Patient Instructions (Signed)
 Nice to see you today  I will be in touch with the labs and CT scan once I have them  Follow up with me as scheduled. Sooner If you need me

## 2024-08-03 ENCOUNTER — Encounter: Payer: Self-pay | Admitting: Nurse Practitioner

## 2024-08-03 ENCOUNTER — Ambulatory Visit: Payer: Self-pay | Admitting: Nurse Practitioner

## 2024-08-04 ENCOUNTER — Ambulatory Visit: Admitting: Physician Assistant

## 2024-08-04 VITALS — BP 154/79 | HR 98

## 2024-08-04 DIAGNOSIS — R3129 Other microscopic hematuria: Secondary | ICD-10-CM | POA: Diagnosis not present

## 2024-08-04 DIAGNOSIS — R319 Hematuria, unspecified: Secondary | ICD-10-CM | POA: Diagnosis not present

## 2024-08-04 LAB — URINALYSIS, COMPLETE
Bilirubin, UA: NEGATIVE
Glucose, UA: NEGATIVE
Ketones, UA: NEGATIVE
Leukocytes,UA: NEGATIVE
Nitrite, UA: NEGATIVE
Specific Gravity, UA: 1.02 (ref 1.005–1.030)
Urobilinogen, Ur: 1 mg/dL (ref 0.2–1.0)
pH, UA: 6 (ref 5.0–7.5)

## 2024-08-04 LAB — MICROSCOPIC EXAMINATION: RBC, Urine: >30 /HPF — AB (ref 0–2)

## 2024-08-04 NOTE — Addendum Note (Signed)
 Addended by: Adaora Mchaney P on: 08/04/2024 10:22 PM   Modules accepted: Level of Service

## 2024-08-04 NOTE — Patient Instructions (Signed)

## 2024-08-04 NOTE — Progress Notes (Addendum)
 08/04/2024 9:24 PM   [name] 05-29-1961 [id number]  CC: Chief Complaint  Patient presents with   Establish Care   Hematuria   HPI: [name] is a 63 y.o. female with PMH gross hematuria with benign workup in 2021, left kidney stone, and right flank pain who presents today for follow up (reestablish care).   Today she reports about 4 months of extreme fatigue. She has had an extensive workup from her PCP, and is here today to rule out urologic causes. She denies dysuria, flank pain, or unintentional weight loss. She admits to some cough, shortness of breath, and midline low back pain, which may be MSK.  In-office UA today positive for trace protein and 3+ blood; urine microscopy with >30 RBCs/HPF and moderate bacteria.   PMH: Past Medical History:  Diagnosis Date   Anxiety 08/23/2014   Chronic heart failure with preserved ejection fraction (HFpEF) (HCC)    a. 02/2000 MUGA: EF 68%; b. 06/2005 Echo: EF 55-65%; c. 06/2017 Echo: EF 60-65%; d. 08/2020 Echo: EF 60-65%, no rwma, nl RV fxn. Mild BAE. Mild MR.   Current moderate episode of major depressive disorder without prior episode (HCC) 12/31/2017   Dependent edema 10/05/2011   DNR (do not resuscitate) 12/06/2022   GERD (gastroesophageal reflux disease) 12/22/1999   Formatting of this note might be different from the original. Controlled by OTC meds   History of breast cancer 10/05/2011   Hypertension 10/05/2011   Long term current use of anticoagulant therapy 08/29/2014   Mild persistent asthma without complication 09/06/2018   Morbid obesity with BMI of 60.0-69.9, adult (HCC) 09/06/2018   Non-obstructive CAD (coronary artery disease)    a. 12/2017 Lexiscan PET/CT: mid anterior, apical lateral, and apical ischemia; b. 01/2018 Cath: LM nl, LAD & LCX 10-30% diff dzs throughout, RCA min irregs (<10%)-->Med rx.   Obstructive sleep apnea, adult 09/06/2018   Paroxysmal atrial fibrillation (HCC) 04/21/2019   Status post right  mastectomy 03/13/2014   Type 2 diabetes mellitus without complication, without long-term current use of insulin  (HCC) 04/21/2019   Urinary bladder incontinence 06/13/2013    Surgical History: Past Surgical History:  Procedure Laterality Date   CARDIAC CATHETERIZATION  02/16/2018   No stents;Dr. Deriso [place], [place] [facility]   COLONOSCOPY WITH PROPOFOL  N/A 11/28/2021   Procedure: COLONOSCOPY WITH PROPOFOL ;  Surgeon: Rollin Dover, MD;  Location: [facility];  Service: Endoscopy;  Laterality: N/A;   FINGER SURGERY Right    MASTECTOMY Right     Home Medications:  Allergies as of 08/04/2024       Reactions   Tape Rash   Paper   Erythromycin Diarrhea        Medication List        Accurate as of August 04, 2024  9:24 PM. If you have any questions, ask your nurse or doctor.          albuterol  108 (90 Base) MCG/ACT inhaler Commonly known as: VENTOLIN  HFA TAKE 2 PUFFS BY MOUTH EVERY 6 HOURS AS NEEDED FOR WHEEZE OR SHORTNESS OF BREATH   budesonide -formoterol  160-4.5 MCG/ACT inhaler Commonly known as: SYMBICORT  Inhale 2 puffs into the lungs in the morning and at bedtime.   Cholecalciferol 125 MCG (5000 UT) Chew Chew 5,000 Units by mouth daily.   clonazePAM  0.5 MG tablet Commonly known as: KlonoPIN  Take 0.5-1 tablets (0.25-0.5 mg total) by mouth 3 (three) times daily as needed for anxiety.   cyclobenzaprine  5 MG tablet Commonly known as: FLEXERIL   Take 1 tablet (5 mg total) by mouth at bedtime as needed.   diltiazem  180 MG 24 hr capsule Commonly known as: CARDIZEM  CD Take 1 capsule (180 mg total) by mouth daily.   dofetilide  500 MCG capsule Commonly known as: TIKOSYN  Take 1 capsule (500 mcg total) by mouth 2 (two) times daily.   DULoxetine  30 MG capsule Commonly known as: Cymbalta  Take 3 capsules (90 mg total) by mouth daily.   Eliquis  5 MG Tabs tablet Generic drug: apixaban  TAKE 1 TABLET BY MOUTH EVERY 12 HOURS   famotidine 20 MG  tablet Commonly known as: PEPCID Take 20 mg by mouth 2 (two) times daily.   fexofenadine 180 MG tablet Commonly known as: ALLEGRA Take 180 mg by mouth daily.   fluticasone  50 MCG/ACT nasal spray Commonly known as: FLONASE  Place 2 sprays into both nostrils daily.   furosemide  40 MG tablet Commonly known as: LASIX  Take 1 tablet (40 mg total) by mouth daily for 1 day.   Iron  325 (65 Fe) MG Tabs Take 1 tablet (325 mg total) by mouth daily.   levothyroxine  50 MCG tablet Commonly known as: SYNTHROID  Take 1 tablet (50 mcg total) by mouth daily before breakfast.   lisinopril  10 MG tablet Commonly known as: ZESTRIL  TAKE 1 TABLET BY MOUTH EVERY DAY   M-Natal Plus 27-1 MG Tabs Take 1 tablet by mouth daily.   magnesium  oxide 400 (240 Mg) MG tablet Commonly known as: MAG-OX TAKE 1 TABLET BY MOUTH TWICE A DAY   metFORMIN  500 MG tablet Commonly known as: GLUCOPHAGE  Take 2 tablets (1,000 mg total) by mouth 2 (two) times daily.   montelukast  10 MG tablet Commonly known as: SINGULAIR  Take 10 mg by mouth at bedtime.   PATADAY  OP Place 1 drop into both eyes daily as needed (itchy eyes).   potassium chloride  SA 20 MEQ tablet Commonly known as: Klor-Con  M20 Take 1 tablet (20 mEq total) by mouth daily.   UNABLE TO FIND CPAP        Allergies:  Allergies  Allergen Reactions   Tape Rash    Paper   Erythromycin Diarrhea    Family History: Family History  Problem Relation Age of Onset   Heart Problems Mother    Hypertension Mother    Angina Mother    Atrial fibrillation Mother    Heart Problems Father    Hypertension Father    Depression Sister    Kidney Stones Sister    Depression Brother    Bipolar disorder Cousin    Bipolar disorder Cousin    Kidney cancer Neg Hx    Prostate cancer Neg Hx    Bladder Cancer Neg Hx     Social History:   reports that she quit smoking about 40 years ago. Her smoking use included cigarettes. She started smoking about 44 years  ago. She has a 1.2 pack-year smoking history. She has never used smokeless tobacco. She reports that she does not currently use alcohol. She reports that she does not use drugs.  Physical Exam: BP (!) 154/79 (BP Location: Left Arm, Patient Position: Sitting, Cuff Size: Large)   Pulse 98   SpO2 90%   Constitutional:  Alert and oriented, no acute distress, nontoxic appearing HEENT: [place], AT Cardiovascular: No clubbing, cyanosis, or edema Respiratory: Normal respiratory effort, no increased work of breathing Skin: No rashes, bruises or suspicious lesions Neurologic: Grossly intact, no focal deficits, moving all 4 extremities Psychiatric: Normal mood and affect  Laboratory Data: Results for  orders placed or performed in visit on 08/04/24  Microscopic Examination   Collection Time: 08/04/24 11:07 AM   Urine  Result Value Ref Range   WBC, UA 0-5 0 - 5 /hpf   RBC, Urine >30 (A) 0 - 2 /hpf   Epithelial Cells (non renal) 0-10 0 - 10 /hpf   Mucus, UA Present (A) Not Estab.   Bacteria, UA Many (A) None seen/Few  Urinalysis, Complete   Collection Time: 08/04/24 11:07 AM  Result Value Ref Range   Specific Gravity, UA 1.020 1.005 - 1.030   pH, UA 6.0 5.0 - 7.5   Color, UA Yellow Yellow   Appearance Ur Slightly cloudy Clear   Leukocytes,UA Negative Negative   Protein,UA Trace Negative/Trace   Glucose, UA Negative Negative   Ketones, UA Negative Negative   RBC, UA 3+ (A) Negative   Bilirubin, UA Negative Negative   Urobilinogen, Ur 1.0 0.2 - 1.0 mg/dL   Nitrite, UA Negative Negative   Microscopic Examination See below:    Assessment & Plan:   1. Microscopic hematuria (Primary) Persistent hematuria, last benign workup 4 years ago. I recommended repeat workup at this time and she agreed. We discussed that workup is two-fold and includes CT urogram followed by cystoscopy. She agrees to proceed.  Will send urine for standard and atypical cultures, but overall she is not clinically infected  today and I have low suspicion for urinary infection as the etiology of her fatigue. - Urinalysis, Complete - CULTURE, URINE COMPREHENSIVE - Mycoplasma / ureaplasma culture - CT HEMATURIA WORKUP; Future   Return in about 4 weeks (around 09/01/2024) for Cysto and CTU results.  Lucie Hones, PA-C  [facility] [place] [address] [place], [place] [address] [phone]

## 2024-08-05 LAB — TIER 2
Jo-1 Autoabs: <1 AI
SSA (Ro) (ENA) Antibody, IgG: <1 AI
SSB (La) (ENA) Antibody, IgG: <1 AI
Scleroderma (Scl-70) (ENA) Antibody, IgG: <1 AI

## 2024-08-05 LAB — ANTI-NUCLEAR AB-TITER (ANA TITER): ANA Titer 1: 1:40 {titer} — ABNORMAL HIGH

## 2024-08-05 LAB — TIER 1
Chromatin (Nucleosomal) Antibody: <1 AI
ENA SM Ab Ser-aCnc: <1 AI
Ribonucleic Protein(ENA) Antibody, IgG: <1 AI
SM/RNP: <1 AI
ds DNA Ab: <1 [IU]/mL

## 2024-08-05 LAB — TIER 3
Centromere Ab Screen: <1 AI
Ribosomal P Protein Ab: <1 AI

## 2024-08-05 LAB — INTERPRETATION

## 2024-08-05 LAB — ANA SCREEN,IFA,REFLEX TITER/PATTERN,REFLEX MPLX 11 AB CASCADE
Anti Nuclear Antibody (ANA): POSITIVE — AB
Cyclic Citrullin Peptide Ab: <16 U
MUTATED CITRULLINATED VIMENTIN (MCV) AB: <20 U/mL (ref <20)
Rheumatoid fact SerPl-aCnc: <10 [IU]/mL (ref <14)

## 2024-08-08 LAB — CULTURE, URINE COMPREHENSIVE

## 2024-08-09 ENCOUNTER — Encounter: Payer: Self-pay | Admitting: Orthopaedic Surgery

## 2024-08-09 ENCOUNTER — Ambulatory Visit: Payer: Self-pay | Admitting: Physician Assistant

## 2024-08-09 ENCOUNTER — Other Ambulatory Visit: Payer: Self-pay | Admitting: Family

## 2024-08-09 ENCOUNTER — Ambulatory Visit: Admitting: Orthopaedic Surgery

## 2024-08-09 DIAGNOSIS — G8929 Other chronic pain: Secondary | ICD-10-CM

## 2024-08-09 DIAGNOSIS — M1712 Unilateral primary osteoarthritis, left knee: Secondary | ICD-10-CM

## 2024-08-09 DIAGNOSIS — M170 Bilateral primary osteoarthritis of knee: Secondary | ICD-10-CM | POA: Diagnosis not present

## 2024-08-09 DIAGNOSIS — M1711 Unilateral primary osteoarthritis, right knee: Secondary | ICD-10-CM

## 2024-08-09 DIAGNOSIS — M25561 Pain in right knee: Secondary | ICD-10-CM

## 2024-08-09 DIAGNOSIS — R768 Other specified abnormal immunological findings in serum: Secondary | ICD-10-CM

## 2024-08-09 MED ORDER — LIDOCAINE HCL 1 % IJ SOLN
3.0000 mL | INTRAMUSCULAR | Status: AC | PRN
  Administered 2024-08-09: 3 mL

## 2024-08-09 MED ORDER — METHYLPREDNISOLONE ACETATE 40 MG/ML IJ SUSP
40.0000 mg | INTRAMUSCULAR | Status: AC | PRN
  Administered 2024-08-09: 40 mg via INTRA_ARTICULAR

## 2024-08-09 MED ORDER — METHYLPREDNISOLONE ACETATE 40 MG/ML IJ SUSP
40.0000 mg | INTRAMUSCULAR | Status: AC | PRN
Start: 2024-08-09 — End: 2024-08-09
  Administered 2024-08-09: 40 mg via INTRA_ARTICULAR

## 2024-08-09 NOTE — Progress Notes (Signed)
 The patient comes in today requesting steroid injections in both her knees which she does on a regular basis about every 3 months given the severity of the arthritis in both of her knees.  She has had a lot of labs drawn recently as a relates to her rheumatological labs and other labs and she does eventually have an appoint with rheumatology.  She does have some chronic muscle aches and pains and she has known arthritis in both of her knees.  She said the injections helped about 2 months.  Both knees have a large soft tissue envelope and we can feel the knee joint and did place a steroid injection in both knees today which she tolerated well.  We can repeat this again in 3 months.    Procedure Note  Patient: [name]             Date of Birth: 1961-11-12           MRN: [id number]             Visit Date: 08/09/2024  Procedures: Visit Diagnoses:  1. Chronic pain of right knee   2. Chronic pain of left knee   3. Unilateral primary osteoarthritis, right knee   4. Unilateral primary osteoarthritis, left knee     Large Joint Inj: R knee on 08/09/2024 3:15 PM Indications: diagnostic evaluation and pain Details: 22 G 1.5 in needle, superolateral approach  Arthrogram: No  Medications: 3 mL lidocaine  1 %; 40 mg methylPREDNISolone  acetate 40 MG/ML Outcome: tolerated well, no immediate complications Procedure, treatment alternatives, risks and benefits explained, specific risks discussed. Consent was given by the patient. Immediately prior to procedure a time out was called to verify the correct patient, procedure, equipment, support staff and site/side marked as required. Patient was prepped and draped in the usual sterile fashion.    Large Joint Inj: L knee on 08/09/2024 3:15 PM Indications: diagnostic evaluation and pain Details: 22 G 1.5 in needle, superolateral approach  Arthrogram: No  Medications: 3 mL lidocaine  1 %; 40 mg methylPREDNISolone  acetate 40 MG/ML Outcome: tolerated well,  no immediate complications Procedure, treatment alternatives, risks and benefits explained, specific risks discussed. Consent was given by the patient. Immediately prior to procedure a time out was called to verify the correct patient, procedure, equipment, support staff and site/side marked as required. Patient was prepped and draped in the usual sterile fashion.

## 2024-08-11 LAB — MYCOPLASMA / UREAPLASMA CULTURE

## 2024-08-16 ENCOUNTER — Ambulatory Visit
Admission: RE | Admit: 2024-08-16 | Discharge: 2024-08-16 | Disposition: A | Discharge status: Home / Self Care | Source: Ambulatory Visit | Attending: Physician Assistant | Admitting: Physician Assistant

## 2024-08-16 DIAGNOSIS — R3129 Other microscopic hematuria: Secondary | ICD-10-CM | POA: Diagnosis not present

## 2024-08-16 DIAGNOSIS — K8020 Calculus of gallbladder without cholecystitis without obstruction: Secondary | ICD-10-CM | POA: Diagnosis not present

## 2024-08-16 DIAGNOSIS — R160 Hepatomegaly, not elsewhere classified: Secondary | ICD-10-CM | POA: Diagnosis not present

## 2024-08-16 DIAGNOSIS — K7689 Other specified diseases of liver: Secondary | ICD-10-CM | POA: Diagnosis not present

## 2024-08-16 LAB — POCT I-STAT CREATININE: Creatinine, Ser: 0.7 mg/dL (ref 0.44–1.00)

## 2024-08-16 MED ORDER — IOHEXOL 300 MG/ML  SOLN
100.0000 mL | Freq: Once | INTRAMUSCULAR | Status: AC | PRN
  Administered 2024-08-16: 100 mL via INTRAVENOUS

## 2024-08-17 ENCOUNTER — Encounter: Payer: Self-pay | Admitting: *Deleted

## 2024-08-17 DIAGNOSIS — F4381 Prolonged grief disorder: Secondary | ICD-10-CM | POA: Diagnosis not present

## 2024-08-22 ENCOUNTER — Other Ambulatory Visit: Payer: Self-pay | Admitting: Nurse Practitioner

## 2024-08-22 DIAGNOSIS — F4381 Prolonged grief disorder: Secondary | ICD-10-CM | POA: Diagnosis not present

## 2024-08-29 DIAGNOSIS — F4381 Prolonged grief disorder: Secondary | ICD-10-CM | POA: Diagnosis not present

## 2024-08-29 DIAGNOSIS — G4733 Obstructive sleep apnea (adult) (pediatric): Secondary | ICD-10-CM | POA: Diagnosis not present

## 2024-08-31 ENCOUNTER — Other Ambulatory Visit

## 2024-09-01 ENCOUNTER — Other Ambulatory Visit: Payer: Self-pay | Admitting: Cardiology

## 2024-09-06 ENCOUNTER — Ambulatory Visit
Admission: RE | Admit: 2024-09-06 | Discharge: 2024-09-06 | Disposition: A | Discharge status: Home / Self Care | Source: Ambulatory Visit | Attending: Nurse Practitioner | Admitting: Nurse Practitioner

## 2024-09-06 DIAGNOSIS — R443 Hallucinations, unspecified: Secondary | ICD-10-CM | POA: Diagnosis not present

## 2024-09-06 DIAGNOSIS — R519 Headache, unspecified: Secondary | ICD-10-CM

## 2024-09-06 DIAGNOSIS — R4182 Altered mental status, unspecified: Secondary | ICD-10-CM | POA: Diagnosis not present

## 2024-09-06 DIAGNOSIS — Z711 Person with feared health complaint in whom no diagnosis is made: Secondary | ICD-10-CM

## 2024-09-07 ENCOUNTER — Encounter: Payer: Self-pay | Admitting: *Deleted

## 2024-09-12 DIAGNOSIS — F4381 Prolonged grief disorder: Secondary | ICD-10-CM | POA: Diagnosis not present

## 2024-09-19 DIAGNOSIS — F4381 Prolonged grief disorder: Secondary | ICD-10-CM | POA: Diagnosis not present

## 2024-09-20 ENCOUNTER — Ambulatory Visit (INDEPENDENT_AMBULATORY_CARE_PROVIDER_SITE_OTHER): Admitting: Urology

## 2024-09-20 VITALS — BP 146/77 | HR 96

## 2024-09-20 DIAGNOSIS — R3129 Other microscopic hematuria: Secondary | ICD-10-CM | POA: Diagnosis not present

## 2024-09-20 MED ORDER — LIDOCAINE HCL URETHRAL/MUCOSAL 2 % EX GEL
1.0000 | Freq: Once | CUTANEOUS | Status: AC
  Administered 2024-09-20: 1 via URETHRAL

## 2024-09-20 MED ORDER — CEPHALEXIN 500 MG PO CAPS
500.0000 mg | ORAL_CAPSULE | Freq: Once | ORAL | Status: AC
  Administered 2024-09-20: 500 mg via ORAL

## 2024-09-20 NOTE — Progress Notes (Signed)
 Cystoscopy Procedure Note:  Indication: Microscopic hematuria  Doxycycline  given for prophylaxis  After informed consent and discussion of the procedure and its risks, [name] was positioned and prepped in the standard fashion. Cystoscopy was performed with a flexible cystoscope. The urethra, bladder neck and entire bladder was visualized in a standard fashion. The ureteral orifices were visualized in their normal location and orientation.  Bladder mucosa grossly normal throughout  Imaging: CT urogram with no urologic abnormalities  Findings: Normal cystoscopy  Assessment and Plan: Follow-up with urology as needed  Redell Burnet, MD 09/20/2024

## 2024-09-24 ENCOUNTER — Other Ambulatory Visit: Payer: Self-pay | Admitting: Internal Medicine

## 2024-09-25 NOTE — Telephone Encounter (Signed)
 Prescription refill request for Eliquis  received. Indication:afib Last office visit:7/25 Scr:0.70  8/25 Age: 63 Weight:175.3  kg  Prescription refilled

## 2024-09-26 DIAGNOSIS — F4381 Prolonged grief disorder: Secondary | ICD-10-CM | POA: Diagnosis not present

## 2024-10-10 DIAGNOSIS — F4381 Prolonged grief disorder: Secondary | ICD-10-CM | POA: Diagnosis not present

## 2024-10-10 DIAGNOSIS — F320 Major depressive disorder, single episode, mild: Secondary | ICD-10-CM | POA: Diagnosis not present

## 2024-10-11 ENCOUNTER — Ambulatory Visit (INDEPENDENT_AMBULATORY_CARE_PROVIDER_SITE_OTHER): Admitting: Physician Assistant

## 2024-10-11 ENCOUNTER — Encounter: Payer: Self-pay | Admitting: Physician Assistant

## 2024-10-11 DIAGNOSIS — F99 Mental disorder, not otherwise specified: Secondary | ICD-10-CM | POA: Diagnosis not present

## 2024-10-11 DIAGNOSIS — F411 Generalized anxiety disorder: Secondary | ICD-10-CM

## 2024-10-11 DIAGNOSIS — F3341 Major depressive disorder, recurrent, in partial remission: Secondary | ICD-10-CM

## 2024-10-11 DIAGNOSIS — F5105 Insomnia due to other mental disorder: Secondary | ICD-10-CM

## 2024-10-11 DIAGNOSIS — F4310 Post-traumatic stress disorder, unspecified: Secondary | ICD-10-CM

## 2024-10-11 DIAGNOSIS — F518 Other sleep disorders not due to a substance or known physiological condition: Secondary | ICD-10-CM

## 2024-10-11 MED ORDER — CLONAZEPAM 0.25 MG PO TBDP: 0.2500 mg | ORAL_TABLET | Freq: Two times a day (BID) | ORAL | 0 refills | Status: DC | PRN

## 2024-10-11 MED ORDER — MELATONIN 3 MG PO TABS: 3.0000 mg | ORAL_TABLET | Freq: Every evening | ORAL | Status: AC | PRN

## 2024-10-11 MED ORDER — CLONAZEPAM 0.5 MG PO TABS: 0.2500 mg | ORAL_TABLET | Freq: Three times a day (TID) | ORAL | 1 refills | Status: AC | PRN

## 2024-10-11 MED ORDER — DULOXETINE HCL 30 MG PO CPEP: 90.0000 mg | ORAL_CAPSULE | Freq: Every day | ORAL | 1 refills | Status: AC

## 2024-10-11 NOTE — Progress Notes (Signed)
 [facility]  Patient ID: [name],  MRN: [id number]  PCP: Nichole Lynwood HERO, NP  Date of Evaluation: 10/11/2024 Time spent:20 minutes  Chief Complaint:  Chief Complaint   Anxiety; Depression; Follow-up    HISTORY/CURRENT STATUS: HPI   For routine med check  Has severe left knee pain, has 'bone on bone' and it's been hurting more and more. Has to walk with a cane.  Will see her orthopedist in a few weeks.   Having trouble sleeping.  Having vivid dreams, sometimes really bad.  I've always had bad dreams. Unable to tell me how long it's been a problem this time. Doesn't feel rested when she gets up. She doesn't want to go to bed b/c she's afraid of what she'll dream. Doesn't watch scary movies or read anything scary.  She doesn't do much except play on her tablet or something. She drinks Dr. Nunzio in the afternoon/evening, but she always has.  So nothing has changed there.  She feels like her current medications are working well.  She asks for a prescription of Klonopin  sublingual so she would have a smaller dose and it would work weekly.  She has taken that in the past and it has been helpful.  Energy and motivation are fair to good.  No extreme sadness, tearfulness, or feelings of hopelessness.  Personal hygiene is normal.  Denies any changes in concentration, making decisions, or remembering things.  Appetite has not changed.   No mania, delirium, AH/VH.  No SI/HI.  Individual Medical History/ Review of Systems: Changes? :No      Past meds include Klonopin , Xanax, Cymbalta , Zoloft had to be discontinued due to medication for A-fib, lamotrigine    Allergies: Tape and Erythromycin  Current Medications:  Current Outpatient Medications:    albuterol  (VENTOLIN  HFA) 108 (90 Base) MCG/ACT inhaler, TAKE 2 PUFFS BY MOUTH EVERY 6 HOURS AS NEEDED FOR WHEEZE OR SHORTNESS OF BREATH, Disp: 18 each, Rfl: 1   budesonide -formoterol  (SYMBICORT ) 160-4.5 MCG/ACT inhaler, Inhale 2 puffs  into the lungs in the morning and at bedtime., Disp: 1 each, Rfl: 3   Cholecalciferol 125 MCG (5000 UT) CHEW, Chew 5,000 Units by mouth daily., Disp: , Rfl:    clonazePAM  (KLONOPIN ) 0.25 MG disintegrating tablet, Take 1 tablet (0.25 mg total) by mouth 2 (two) times daily as needed., Disp: 30 tablet, Rfl: 0   cyclobenzaprine  (FLEXERIL ) 5 MG tablet, Take 1 tablet (5 mg total) by mouth at bedtime as needed., Disp: 20 tablet, Rfl: 0   diltiazem  (CARDIZEM  CD) 180 MG 24 hr capsule, Take 1 capsule (180 mg total) by mouth daily., Disp: 90 capsule, Rfl: 3   dofetilide  (TIKOSYN ) 500 MCG capsule, TAKE 1 CAPSULE BY MOUTH 2 TIMES DAILY., Disp: 180 capsule, Rfl: 3   ELIQUIS  5 MG TABS tablet, TAKE 1 TABLET BY MOUTH EVERY 12 HOURS, Disp: 60 tablet, Rfl: 5   famotidine (PEPCID) 20 MG tablet, Take 20 mg by mouth 2 (two) times daily., Disp: , Rfl:    Ferrous Sulfate  (IRON ) 325 (65 Fe) MG TABS, Take 1 tablet (325 mg total) by mouth daily., Disp: 30 tablet, Rfl: 5   fexofenadine (ALLEGRA) 180 MG tablet, Take 180 mg by mouth daily., Disp: , Rfl:    fluticasone  (FLONASE ) 50 MCG/ACT nasal spray, Place 2 sprays into both nostrils daily., Disp: 16 g, Rfl: 5   levothyroxine  (SYNTHROID ) 50 MCG tablet, Take 1 tablet (50 mcg total) by mouth daily before breakfast., Disp: 90 tablet, Rfl: 1  lisinopril  (ZESTRIL ) 10 MG tablet, TAKE 1 TABLET BY MOUTH EVERY DAY, Disp: 30 tablet, Rfl: 11   magnesium  oxide (MAG-OX) 400 (240 Mg) MG tablet, TAKE 1 TABLET BY MOUTH TWICE A DAY, Disp: 180 tablet, Rfl: 2   melatonin 3 MG TABS tablet, Take 1 tablet (3 mg total) by mouth at bedtime as needed., Disp: , Rfl:    metFORMIN  (GLUCOPHAGE ) 500 MG tablet, TAKE 2 TABLETS BY MOUTH TWICE A DAY, Disp: 120 tablet, Rfl: 5   montelukast  (SINGULAIR ) 10 MG tablet, Take 10 mg by mouth at bedtime., Disp: , Rfl:    Olopatadine  HCl (PATADAY  OP), Place 1 drop into both eyes daily as needed (itchy eyes)., Disp: , Rfl:    potassium chloride  SA (KLOR-CON  M20) 20 MEQ  tablet, Take 1 tablet (20 mEq total) by mouth daily., Disp: 30 tablet, Rfl: 6   Prenatal Vit-Fe Fumarate-FA (M-NATAL PLUS) 27-1 MG TABS, Take 1 tablet by mouth daily., Disp: , Rfl:    UNABLE TO FIND, CPAP, Disp: , Rfl:    clonazePAM  (KLONOPIN ) 0.5 MG tablet, Take 0.5-1 tablets (0.25-0.5 mg total) by mouth 3 (three) times daily as needed for anxiety., Disp: 90 tablet, Rfl: 1   DULoxetine  (CYMBALTA ) 30 MG capsule, Take 3 capsules (90 mg total) by mouth daily., Disp: 270 capsule, Rfl: 1   furosemide  (LASIX ) 40 MG tablet, Take 1 tablet (40 mg total) by mouth daily for 1 day., Disp: 90 tablet, Rfl: 1 Medication Side Effects: none  Family Medical/ Social History: Changes? No  MENTAL HEALTH EXAM:  There were no vitals taken for this visit.There is no height or weight on file to calculate BMI.  General Appearance: Casual, Well Groomed, and morbidly obese  Eye Contact:  Good  Speech:  Clear and Coherent and Normal Rate  Volume:  Normal  Mood:  Euthymic  Affect:  Congruent  Thought Process:  Goal Directed and Descriptions of Associations: Circumstantial  Orientation:  Full (Time, Place, and Person)  Thought Content: Logical   Suicidal Thoughts:  No  Homicidal Thoughts:  No  Memory:  WNL  Judgement:  Good  Insight:  Good  Psychomotor Activity:  Walks slowly with a cane  Concentration:  Concentration: Good and Attention Span: Good  Recall:  Good  Fund of Knowledge: Good  Language: Good  Assets:  Communication Skills Desire for Improvement Financial Resources/Insurance Housing Transportation  ADL's:  Intact  Cognition: WNL  Prognosis:  Good   DIAGNOSES:    ICD-10-CM   1. Recurrent major depression in partial remission  F33.41     2. Generalized anxiety disorder  F41.1     3. PTSD (post-traumatic stress disorder)  F43.10     4. Insomnia due to other mental disorder  F51.05    F99     5. Abnormal dreams  F51.8       Receiving Psychotherapy: Yes  [name], trauma  specialist    RECOMMENDATIONS:  PDMP reviewed.  Klonopin  filled 10/03/2024. I provided approximately  20 minutes of face to face time during this encounter, including time spent before and after the visit in records review, medical decision making, counseling pertinent to today's visit, and charting.   We discussed sleep hygiene.  Recommend OTC melatonin.  Explained that less is more.  Take approximately 30 minutes before she wants to go to sleep.  Do not push through the drowsiness, or she will miss the window.  She understands.  It is fine to take the Klonopin  0.25 sublingual.  Klonopin   0.25 mg sublingual twice daily as needed.  Take either this or the Klonopin  tablets, not both. Continue Klonopin  0.5 mg, 1/2-1 p.o. 3 times daily as needed anxiety. Continue Cymbalta  90 mg daily. Start melatonin 3 mg nightly as needed. Continue therapy with Michelle Gallamore. Return in 6 months.   Verneita Cooks, PA-C

## 2024-10-14 ENCOUNTER — Encounter: Payer: Self-pay | Admitting: Nurse Practitioner

## 2024-10-17 DIAGNOSIS — F320 Major depressive disorder, single episode, mild: Secondary | ICD-10-CM | POA: Diagnosis not present

## 2024-10-17 DIAGNOSIS — F4381 Prolonged grief disorder: Secondary | ICD-10-CM | POA: Diagnosis not present

## 2024-10-17 MED ORDER — MONTELUKAST SODIUM 10 MG PO TABS: 10.0000 mg | ORAL_TABLET | Freq: Every day | ORAL | 1 refills | Status: AC

## 2024-10-20 ENCOUNTER — Ambulatory Visit: Attending: Cardiology | Admitting: Cardiology

## 2024-10-20 ENCOUNTER — Encounter: Payer: Self-pay | Admitting: Cardiology

## 2024-10-20 VITALS — BP 113/74 | HR 85 | Ht 64.5 in | Wt 380.4 lb

## 2024-10-20 DIAGNOSIS — I4819 Other persistent atrial fibrillation: Secondary | ICD-10-CM

## 2024-10-20 DIAGNOSIS — Z5181 Encounter for therapeutic drug level monitoring: Secondary | ICD-10-CM

## 2024-10-20 DIAGNOSIS — D6869 Other thrombophilia: Secondary | ICD-10-CM | POA: Diagnosis not present

## 2024-10-20 DIAGNOSIS — Z79899 Other long term (current) drug therapy: Secondary | ICD-10-CM

## 2024-10-20 NOTE — Progress Notes (Signed)
 Electrophysiology Clinic Note    Date:  10/20/2024  Patient ID:  [name], [name] [birth year], MRN [id number] PCP:  Wendee Lynwood HERO, NP  Cardiologist:  Lonni Hanson, MD  Electrophysiologist:  OLE ONEIDA HOLTS, MD  Electrophysiology APP:  Raynell Scott, NP     Discussed the use of AI scribe software for clinical note transcription with the patient, who gave verbal consent to proceed.   Patient Profile    Chief Complaint: AF, tikosyn  follow-up  History of Present Illness: [name] is a 63 y.o. female with PMH notable for persis Afib, HFpEF, T2DM, OSA, HTN, obesity ; seen today for OLE ONEIDA HOLTS, MD for routine electrophysiology followup.   She is doing well from a heart perspective, denies palpitations, AFib episodes, chest pain, chest pressure. She continues to take tikosyn  and eliquis  every 12 hours without missing doses. No bleeding concerns.   She recently had labwork done by PCP to further workup fatigue, body pain that was concerning for RA - has appt soon with rheumatology for further eval.     Arrhythmia/Device History Tikosyn  - loaded 02/2023     ROS:  Please see the history of present illness. All other systems are reviewed and otherwise negative.    Physical Exam    VS:  BP 113/74   Pulse 85   Ht 5' 4.5 (1.638 m)   Wt (!) 380 lb 6.4 oz (172.5 kg)   SpO2 96%   BMI 64.29 kg/m  BMI: Body mass index is 64.29 kg/m.           Wt Readings from Last 3 Encounters:  10/20/24 (!) 380 lb 6.4 oz (172.5 kg)  08/01/24 (!) 386 lb 6.4 oz (175.3 kg)  06/29/24 (!) 383 lb 3.2 oz (173.8 kg)     GEN- The patient is well appearing, alert and oriented x 3 today.   Lungs- Clear to ausculation bilaterally, normal work of breathing.  Heart- Regular rate and rhythm, no murmurs, rubs or gallops Extremities- Trace peripheral edema, warm, dry   Studies Reviewed   Previous EP, cardiology notes.    EKG is ordered. Personal review of EKG from today shows:     EKG Interpretation Date/Time:  Friday October 20 2024 14:35:42 EDT Ventricular Rate:  85 PR Interval:  138 QRS Duration:  78 QT Interval:  386 QTC Calculation: 459 R Axis:   63  Text Interpretation: Normal sinus rhythm Normal ECG Confirmed by Desia Saban [phone]) on 10/20/2024 2:40:51 PM    06/2024 EKG - SR at 90. QTC 450  02/2024 EKG - SR at 87. QTC 458   TTE, 01/05/2023  1. Left ventricular ejection fraction, by estimation, is 60 to 65%. The left ventricle has normal function. Left ventricular endocardial border not optimally defined to evaluate regional wall motion. The left ventricular internal cavity size was mildly dilated. There is mild left ventricular hypertrophy. Left ventricular diastolic parameters were normal.   2. Right ventricular systolic function is normal. The right ventricular size is mildly enlarged. There is moderately elevated pulmonary artery systolic pressure.   3. Right atrial size was mildly dilated.   4. The mitral valve is grossly normal. Trivial mitral valve regurgitation.   5. The aortic valve was not well visualized. Aortic valve regurgitation is not visualized. No aortic stenosis is present.   6. The inferior vena cava is dilated in size with <50% respiratory variability, suggesting right atrial pressure of 15 mmHg.    Coronary CTA, 01/04/2023  1. Normal coronary calcium  score of 0.  Patient is low risk.  2. Normal coronary origin with right dominance.  3. No evidence of CAD.  4. CAD-RADS 0. Consider non-atherosclerotic causes of chest pain.  5. Image quality degraded by obesity related artifacts.   TTE, 08/30/2020  1. Left ventricular ejection fraction, by estimation, is 60 to 65%. The left ventricle has normal function. The left ventricle has no regional wall motion abnormalities. Left ventricular diastolic parameters were normal.   2. Right ventricular systolic function is normal. The right ventricular size is normal. Tricuspid regurgitation signal is  inadequate for assessing PA pressure.   3. Left atrial size was mildly dilated.   4. Right atrial size was mildly dilated.   5. Mild mitral valve regurgitation.      Assessment and Plan     #) persis AFib #) tikosyn  monitoring No recent Afib episodes EKG with stable QTC ~480ms Update BMP, mag today   #) Hypercoag d/t afib CHA2DS2-VASc Score = at least 4 [CHF History: 1, HTN History: 1, Diabetes History: 1, Stroke History: 0, Vascular Disease History: 0, Age Score: 0, Gender Score: 1].  Therefore, the patient's annual risk of stroke is 4.8 %.    Stroke ppx - 5mg  eliquis  BID, appropriately dosed No bleeding concerns         Current medicines are reviewed at length with the patient today.   The patient does not have concerns regarding her medicines.  The following changes were made today:  none  Labs/ tests ordered today include:  Orders Placed This Encounter  Procedures   Basic metabolic panel with GFR   Magnesium    EKG 12-Lead     Disposition: Follow up with Dr. Cindie or EP APP in 3-4 months    Signed, Suleyman Ehrman, NP  10/20/24  3:09 PM  Electrophysiology [facility]

## 2024-10-20 NOTE — Patient Instructions (Signed)
 Medication Instructions:  Your physician recommends that you continue on your current medications as directed. Please refer to the Current Medication list given to you today.  *If you need a refill on your cardiac medications before your next appointment, please call your pharmacy*  Lab Work: Your provider would like for you to have following labs drawn today BMP, MAG.   If you have labs (blood work) drawn today and your tests are completely normal, you will receive your results only by: MyChart Message (if you have MyChart) OR A paper copy in the mail If you have any lab test that is abnormal or we need to change your treatment, we will call you to review the results.  Testing/Procedures: No test ordered today   Follow-Up: At [facility], you and your health needs are our priority.  As part of our continuing mission to provide you with exceptional heart care, our providers are all part of one team.  This team includes your primary Cardiologist (physician) and Advanced Practice Providers or APPs (Physician Assistants and Nurse Practitioners) who all work together to provide you with the care you need, when you need it.  Your next appointment:   4 month(s)  Provider:   Suzann Riddle, NP    We recommend signing up for the patient portal called MyChart.  Sign up information is provided on this After Visit Summary.  MyChart is used to connect with patients for Virtual Visits (Telemedicine).  Patients are able to view lab/test results, encounter notes, upcoming appointments, etc.  Non-urgent messages can be sent to your provider as well.   To learn more about what you can do with MyChart, go to [email or web address].

## 2024-10-21 LAB — BASIC METABOLIC PANEL WITH GFR
BUN/Creatinine Ratio: 19 (ref 12–28)
BUN: 11 mg/dL (ref 8–27)
CO2: 24 mmol/L (ref 20–29)
Calcium: 9.3 mg/dL (ref 8.7–10.3)
Chloride: 97 mmol/L (ref 96–106)
Creatinine, Ser: 0.59 mg/dL (ref 0.57–1.00)
Glucose: 195 mg/dL — ABNORMAL HIGH (ref 70–99)
Potassium: 4.4 mmol/L (ref 3.5–5.2)
Sodium: 138 mmol/L (ref 134–144)
eGFR: 101 mL/min/1.73 (ref >59)

## 2024-10-21 LAB — MAGNESIUM: Magnesium: 1.9 mg/dL (ref 1.6–2.3)

## 2024-10-23 ENCOUNTER — Encounter: Payer: Self-pay | Admitting: Radiology

## 2024-10-23 ENCOUNTER — Ambulatory Visit: Payer: Self-pay | Admitting: Cardiology

## 2024-10-24 DIAGNOSIS — M791 Myalgia, unspecified site: Secondary | ICD-10-CM | POA: Diagnosis not present

## 2024-10-24 DIAGNOSIS — M2550 Pain in unspecified joint: Secondary | ICD-10-CM | POA: Diagnosis not present

## 2024-10-24 DIAGNOSIS — R5382 Chronic fatigue, unspecified: Secondary | ICD-10-CM | POA: Diagnosis not present

## 2024-10-26 DIAGNOSIS — F4381 Prolonged grief disorder: Secondary | ICD-10-CM | POA: Diagnosis not present

## 2024-10-26 DIAGNOSIS — F320 Major depressive disorder, single episode, mild: Secondary | ICD-10-CM | POA: Diagnosis not present

## 2024-10-31 ENCOUNTER — Encounter: Payer: Self-pay | Admitting: Nurse Practitioner

## 2024-11-01 DIAGNOSIS — F320 Major depressive disorder, single episode, mild: Secondary | ICD-10-CM | POA: Diagnosis not present

## 2024-11-01 DIAGNOSIS — F4381 Prolonged grief disorder: Secondary | ICD-10-CM | POA: Diagnosis not present

## 2024-11-01 MED ORDER — CYCLOBENZAPRINE HCL 10 MG PO TABS: 10.0000 mg | ORAL_TABLET | Freq: Three times a day (TID) | ORAL | 0 refills | Status: AC | PRN

## 2024-11-09 ENCOUNTER — Encounter: Payer: Self-pay | Admitting: Orthopaedic Surgery

## 2024-11-09 ENCOUNTER — Ambulatory Visit: Admitting: Orthopaedic Surgery

## 2024-11-09 DIAGNOSIS — M1711 Unilateral primary osteoarthritis, right knee: Secondary | ICD-10-CM

## 2024-11-09 DIAGNOSIS — M1712 Unilateral primary osteoarthritis, left knee: Secondary | ICD-10-CM

## 2024-11-09 DIAGNOSIS — M25562 Pain in left knee: Secondary | ICD-10-CM

## 2024-11-09 DIAGNOSIS — G8929 Other chronic pain: Secondary | ICD-10-CM

## 2024-11-09 DIAGNOSIS — M170 Bilateral primary osteoarthritis of knee: Secondary | ICD-10-CM | POA: Diagnosis not present

## 2024-11-09 DIAGNOSIS — M25561 Pain in right knee: Secondary | ICD-10-CM | POA: Diagnosis not present

## 2024-11-09 MED ORDER — LIDOCAINE HCL 1 % IJ SOLN
3.0000 mL | INTRAMUSCULAR | Status: AC | PRN
  Administered 2024-11-09: 3 mL

## 2024-11-09 MED ORDER — METHYLPREDNISOLONE ACETATE 40 MG/ML IJ SUSP
40.0000 mg | INTRAMUSCULAR | Status: AC | PRN
  Administered 2024-11-09: 40 mg via INTRA_ARTICULAR

## 2024-11-09 NOTE — Progress Notes (Signed)
 [name] comes in today requesting steroid injections in both her knees.  This is to treat the significant pain from osteoarthritis of her knees.  We have been doing this for a long period of time.  She says the injections last about 2 months or so.  She has someone with a very high BMI and is not a surgical candidate.  She did let me know recently this week that she had significant dental surgery due to a cracked tooth from a bone that was in some meat that was not supposed to be there.  Otherwise her health has been good.  She is notable to controlled diabetic.  On exam both knees have varus malalignment and a large soft tissue envelope about both knees.  I did place a steroid injection in both knees today which she tolerated well.  We can repeat this again in 3 months.    Procedure Note  Patient: [name]             Date of Birth: 1961/02/04           MRN: [id number]             Visit Date: 11/09/2024  Procedures: Visit Diagnoses:  1. Chronic pain of right knee   2. Chronic pain of left knee   3. Unilateral primary osteoarthritis, right knee   4. Unilateral primary osteoarthritis, left knee     Large Joint Inj: L knee on 11/09/2024 3:10 PM Indications: diagnostic evaluation and pain Details: 22 G 1.5 in needle, superolateral approach  Arthrogram: No  Medications: 3 mL lidocaine  1 %; 40 mg methylPREDNISolone  acetate 40 MG/ML Outcome: tolerated well, no immediate complications Procedure, treatment alternatives, risks and benefits explained, specific risks discussed. Consent was given by the patient. Immediately prior to procedure a time out was called to verify the correct patient, procedure, equipment, support staff and site/side marked as required. Patient was prepped and draped in the usual sterile fashion.    Large Joint Inj: R knee on 11/09/2024 3:11 PM Indications: diagnostic evaluation and pain Details: 22 G 1.5 in needle, superolateral approach  Arthrogram:  No  Medications: 3 mL lidocaine  1 %; 40 mg methylPREDNISolone  acetate 40 MG/ML Outcome: tolerated well, no immediate complications Procedure, treatment alternatives, risks and benefits explained, specific risks discussed. Consent was given by the patient. Immediately prior to procedure a time out was called to verify the correct patient, procedure, equipment, support staff and site/side marked as required. Patient was prepped and draped in the usual sterile fashion.

## 2024-11-13 DIAGNOSIS — F320 Major depressive disorder, single episode, mild: Secondary | ICD-10-CM | POA: Diagnosis not present

## 2024-11-13 DIAGNOSIS — F4381 Prolonged grief disorder: Secondary | ICD-10-CM | POA: Diagnosis not present

## 2024-11-20 ENCOUNTER — Ambulatory Visit: Admitting: Nurse Practitioner

## 2024-11-20 VITALS — BP 130/78 | HR 78 | Temp 98.4°F | Ht 64.5 in | Wt 372.0 lb

## 2024-11-20 DIAGNOSIS — Z7984 Long term (current) use of oral hypoglycemic drugs: Secondary | ICD-10-CM

## 2024-11-20 DIAGNOSIS — Z23 Encounter for immunization: Secondary | ICD-10-CM | POA: Diagnosis not present

## 2024-11-20 DIAGNOSIS — M791 Myalgia, unspecified site: Secondary | ICD-10-CM | POA: Diagnosis not present

## 2024-11-20 DIAGNOSIS — E119 Type 2 diabetes mellitus without complications: Secondary | ICD-10-CM

## 2024-11-20 DIAGNOSIS — M2550 Pain in unspecified joint: Secondary | ICD-10-CM

## 2024-11-20 LAB — POCT GLYCOSYLATED HEMOGLOBIN (HGB A1C): Hemoglobin A1C: 6.3 % — AB (ref 4.0–5.6)

## 2024-11-20 NOTE — Progress Notes (Signed)
 Established Patient Office Visit  Subjective   Patient ID: [name], female    DOB: 08/26/1961  Age: 63 y.o. MRN: [id number]  Chief Complaint  Patient presents with   Diabetes   Flu Vaccine    Discussed the use of AI scribe software for clinical note transcription with the patient, who gave verbal consent to proceed.  History of Present Illness [name] is a 63 year old female with diabetes who presents for follow-up on her blood sugar levels.  She has not been checking her blood sugar levels at home but tolerates metformin  well. Her appetite is inconsistent, often requiring her to force herself to eat, particularly at night when she is not hungry. Consequently, she occasionally skips doses of metformin  and magnesium  to avoid stomach upset when she does not eat. Her A1c is currently 6.3, slightly up from 6.1 previously. She has lost 14 pounds since August, now weighing 372 pounds, down from 386 pounds. Her fluid intake includes diet Dr. Nunzio and water, and she tries to balance her intake by drinking water with each soda. She does not drink coffee regularly, only on occasions when she goes out for breakfast, but this will be decaf.  She has experienced a headache for the past three days, described as more of an ache than a hurt. Her father had migraines, and she recalls that caffeine helped him. She tried using coffee pods for her Carolyne, which she feels helped alleviate her headache.  She has a history of atrial fibrillation and prefers decaf beverages due to her heart condition.  She recently saw a rheumatologist, Elsie DeFoor, and had various labs drawn. However, due to insurance changes, she needs to switch to a different provider, Lonni Ester, as her current clinic will be out of network.  No fever, chills, chest pain, or new shortness of breath. Bowel movements are regular on her current medications.  She is losing her trauma counselor due to insurance changes and  is seeking a quarry manager, which she finds challenging to find.     Review of Systems  Constitutional:  Negative for chills and fever.  Respiratory:  Negative for shortness of breath.   Cardiovascular:  Negative for chest pain.  Gastrointestinal:  Negative for abdominal pain and constipation.  Neurological:  Positive for headaches.      Objective:     BP 130/78   Pulse 78   Temp 98.4 F (36.9 C) (Oral)   Ht 5' 4.5 (1.638 m)   Wt (!) 372 lb (168.7 kg)   SpO2 95%   BMI 62.87 kg/m  BP Readings from Last 3 Encounters:  11/20/24 130/78  10/20/24 113/74  09/20/24 (!) 146/77   Wt Readings from Last 3 Encounters:  11/20/24 (!) 372 lb (168.7 kg)  10/20/24 (!) 380 lb 6.4 oz (172.5 kg)  08/01/24 (!) 386 lb 6.4 oz (175.3 kg)   SpO2 Readings from Last 3 Encounters:  11/20/24 95%  10/20/24 96%  09/20/24 95%      Physical Exam Vitals and nursing note reviewed.  Constitutional:      Appearance: Normal appearance.  Cardiovascular:     Rate and Rhythm: Normal rate and regular rhythm.     Heart sounds: Normal heart sounds.  Pulmonary:     Effort: Pulmonary effort is normal.     Breath sounds: Normal breath sounds.  Abdominal:     General: Bowel sounds are normal.  Neurological:     Mental Status: She is alert.  Results for orders placed or performed in visit on 11/20/24  POCT glycosylated hemoglobin (Hb A1C)  Result Value Ref Range   Hemoglobin A1C 6.3 (A) 4.0 - 5.6 %   HbA1c POC (<> result, manual entry)     HbA1c, POC (prediabetic range)     HbA1c, POC (controlled diabetic range)        The 10-year ASCVD risk score (Arnett DK, et al., 2019) is: 11.5%* (Cholesterol units were assumed)    Assessment & Plan:   Problem List Items Addressed This Visit       Endocrine   Type 2 diabetes mellitus without complication, without long-term current use of insulin  (HCC) - Primary   Relevant Orders   POCT glycosylated hemoglobin (Hb A1C) (Completed)      Other   Need for influenza vaccination   Relevant Orders   Flu vaccine trivalent PF, 6mos and older(Flulaval,Afluria,Fluarix,Fluzone) (Completed)   Myalgia   Relevant Orders   Ambulatory referral to Rheumatology   Multiple joint pain   Relevant Orders   Ambulatory referral to Rheumatology   Assessment and Plan Assessment & Plan Type 2 diabetes mellitus Well-controlled with A1c of 6.3. Weight decreased from 386 lbs to 372 lbs. Tolerating metformin  well, adjusts intake based on appetite. - Continue metformin  as tolerated. - Encouraged dietary modifications for weight loss and glycemic control.  Appetite disturbance Persists, particularly at night, leading to skipped doses of metformin  and magnesium . Addressed with Verneita and other healthcare providers. - Continue working with Verneita and other healthcare providers to address appetite disturbance.  Atrial fibrillation Managed with current medications. Prefers decaffeinated beverages. - Continue current management for atrial fibrillation.  General Health Maintenance Due for flu shot and advised COVID-19 vaccine due to comorbidities.  - Administered flu vaccine today. - Advised to receive COVID-19 vaccine at a pharmacy.  Return in about 6 months (around 05/21/2025) for CPE and Labs.    Adina Crandall, NP

## 2024-11-20 NOTE — Patient Instructions (Signed)
 Nice to see you today  We did update your flu vaccine Your A1C is 6.3% today (which is good) Follow up with me in 6 months, sooner if you need me  I have made a referral to Dr. Jeannetta with Rheumatology

## 2024-11-21 ENCOUNTER — Other Ambulatory Visit: Payer: Self-pay | Admitting: Nurse Practitioner

## 2024-11-21 ENCOUNTER — Other Ambulatory Visit (HOSPITAL_COMMUNITY): Payer: Self-pay | Admitting: Physician Assistant

## 2024-11-21 DIAGNOSIS — F320 Major depressive disorder, single episode, mild: Secondary | ICD-10-CM | POA: Diagnosis not present

## 2024-11-21 DIAGNOSIS — F4381 Prolonged grief disorder: Secondary | ICD-10-CM | POA: Diagnosis not present

## 2024-11-28 DIAGNOSIS — F320 Major depressive disorder, single episode, mild: Secondary | ICD-10-CM | POA: Diagnosis not present

## 2024-11-28 DIAGNOSIS — F4381 Prolonged grief disorder: Secondary | ICD-10-CM | POA: Diagnosis not present

## 2024-11-29 DIAGNOSIS — G4733 Obstructive sleep apnea (adult) (pediatric): Secondary | ICD-10-CM | POA: Diagnosis not present

## 2024-12-05 DIAGNOSIS — F320 Major depressive disorder, single episode, mild: Secondary | ICD-10-CM | POA: Diagnosis not present

## 2024-12-11 DIAGNOSIS — F320 Major depressive disorder, single episode, mild: Secondary | ICD-10-CM | POA: Diagnosis not present

## 2024-12-22 ENCOUNTER — Ambulatory Visit
Admission: EM | Admit: 2024-12-22 | Discharge: 2024-12-22 | Disposition: A | Discharge status: Home / Self Care | Attending: Family Medicine | Admitting: Family Medicine

## 2024-12-22 ENCOUNTER — Encounter: Payer: Self-pay | Admitting: Emergency Medicine

## 2024-12-22 DIAGNOSIS — S90413A Abrasion, unspecified great toe, initial encounter: Secondary | ICD-10-CM | POA: Diagnosis not present

## 2024-12-22 DIAGNOSIS — Z7901 Long term (current) use of anticoagulants: Secondary | ICD-10-CM | POA: Diagnosis not present

## 2024-12-22 MED ORDER — MUPIROCIN 2 % EX OINT: 1.0000 | TOPICAL_OINTMENT | Freq: Two times a day (BID) | CUTANEOUS | 0 refills | Status: AC

## 2024-12-22 NOTE — ED Triage Notes (Signed)
 Pt presents c/o toe injuries x today. Pt states,  Well, I was trying to save money. So, instead of going to get a pedicure, I instead attempted to clip them myself. I tried to pull an ingrown toenail out of the right toe and it won't stop bleeding. Then, I was clipping my big toe nail and accidentally clipped myself.

## 2024-12-22 NOTE — Discharge Instructions (Addendum)
 Keep your feet elevated as much as possible this evening.  After that you can wash your cuts on your toes 2 times daily with soapy water and put rest antibiotic ointment on it and a Band-Aid.  Put mupirocin ointment on the sore areas twice daily until improved  Call the podiatry office for an appointment.

## 2024-12-22 NOTE — ED Provider Notes (Signed)
 " [facility]    CSN: [id number] Arrival date & time: 12/22/24  1714      History   Chief Complaint Chief Complaint  Patient presents with   Toe Injury    HPI [name] is a 64 y.o. female.   HPI Here for a cut on her great toes.  Today she was trying to cut her toenails and ended up cutting the medial nail folds on both great toes, left more than right.  They have been bleeding despite her holding pressure and elevating them.  She takes Eliquis .  She is allergic to erythromycin.  She does have diabetes.   Past Medical History:  Diagnosis Date   Anxiety 08/23/2014   Chronic heart failure with preserved ejection fraction (HFpEF) (HCC)    a. 02/2000 MUGA: EF 68%; b. 06/2005 Echo: EF 55-65%; c. 06/2017 Echo: EF 60-65%; d. 08/2020 Echo: EF 60-65%, no rwma, nl RV fxn. Mild BAE. Mild MR.   Current moderate episode of major depressive disorder without prior episode (HCC) 12/31/2017   Dependent edema 10/05/2011   DNR (do not resuscitate) 12/06/2022   GERD (gastroesophageal reflux disease) 12/22/1999   Formatting of this note might be different from the original. Controlled by OTC meds   History of breast cancer 10/05/2011   Hypertension 10/05/2011   Long term current use of anticoagulant therapy 08/29/2014   Mild persistent asthma without complication 09/06/2018   Morbid obesity with BMI of 60.0-69.9, adult (HCC) 09/06/2018   Non-obstructive CAD (coronary artery disease)    a. 12/2017 Lexiscan PET/CT: mid anterior, apical lateral, and apical ischemia; b. 01/2018 Cath: LM nl, LAD & LCX 10-30% diff dzs throughout, RCA min irregs (<10%)-->Med rx.   Obstructive sleep apnea, adult 09/06/2018   Paroxysmal atrial fibrillation (HCC) 04/21/2019   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/2014   Type 2 diabetes mellitus without complication, without long-term current use of insulin  (HCC) 04/21/2019   Urinary bladder incontinence 06/13/2013    Patient Active Problem List    Diagnosis Date Noted   Myalgia 08/01/2024   Multiple joint pain 08/01/2024   Concern about memory 08/01/2024   Hallucinations 08/01/2024   Frequent headaches 08/01/2024   Fatigue 08/01/2024   TMJ (temporomandibular joint syndrome) 05/16/2024   Asthma exacerbation 04/14/2024   Hypothyroidism 02/17/2024   Paroxysmal A-fib (HCC) 02/22/2023   Hypercoagulable state due to paroxysmal atrial fibrillation (HCC) 01/26/2023   Hypotension 01/04/2023   Overdose of beta-adrenergic antagonist drug 01/04/2023   Gastroenteritis 12/06/2022   DNR (do not resuscitate) 12/06/2022   Gross hematuria 08/10/2020   OSA on CPAP 06/03/2020   PAF (paroxysmal atrial fibrillation) (HCC) 04/21/2019   Chronic heart failure with preserved ejection fraction (HFpEF) (HCC) 04/21/2019   Type 2 diabetes mellitus without complication, without long-term current use of insulin  (HCC) 04/21/2019   Cough 09/06/2018   Dyspnea and respiratory abnormality 09/06/2018   Mild persistent asthma without complication 09/06/2018   Morbid obesity with BMI of 60.0-69.9, adult (HCC) 09/06/2018   Need for influenza vaccination 09/06/2018   Obstructive sleep apnea, adult 09/06/2018   Abnormal stress test 01/25/2018   Current moderate episode of major depressive disorder without prior episode (HCC) 12/31/2017   Low serum vitamin D  12/31/2017   Depression 09/20/2014   Long term current use of anticoagulant therapy 08/29/2014   Long-term use of high-risk medication 08/29/2014   Anxiety 08/23/2014   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/2014   Urinary bladder incontinence 06/13/2013   Edema 12/16/2012   Other abnormal glucose  12/16/2012   Essential hypertension 10/05/2011   Dependent edema 10/05/2011   History of breast cancer 10/05/2011   Morbid obesity (HCC) 10/05/2011   GERD (gastroesophageal reflux disease) 12/22/1999    Past Surgical History:  Procedure Laterality Date   CARDIAC CATHETERIZATION  02/16/2018   No stents;[place]. [place]iso  [place] [facility]   COLONOSCOPY WITH PROPOFOL  N/A 11/28/2021   Procedure: COLONOSCOPY WITH PROPOFOL ;  Surgeon: Rollin Dover, MD;  Location: [facility];  Service: Endoscopy;  Laterality: N/A;   FINGER SURGERY Right    MASTECTOMY Right     OB History   No obstetric history on file.      Home Medications    Prior to Admission medications  Medication Sig Start Date End Date Taking? Authorizing Provider  diltiazem  (CARDIZEM  CD) 180 MG 24 hr capsule Take 1 capsule (180 mg total) by mouth daily. 02/25/24  Yes Riddle, Suzann, NP  dofetilide  (TIKOSYN ) 500 MCG capsule TAKE 1 CAPSULE BY MOUTH 2 TIMES DAILY. 09/01/24  Yes Riddle, Suzann, NP  DULoxetine  (CYMBALTA ) 30 MG capsule Take 3 capsules (90 mg total) by mouth daily. 10/11/24  Yes Hurst, Verneita DASEN, PA-C  ELIQUIS  5 MG TABS tablet TAKE 1 TABLET BY MOUTH EVERY 12 HOURS 09/25/24  Yes End, Lonni, MD  levothyroxine  (SYNTHROID ) 50 MCG tablet TAKE 1 TABLET BY MOUTH DAILY BEFORE BREAKFAST 11/22/24  Yes Wendee Lynwood HERO, NP  lisinopril  (ZESTRIL ) 10 MG tablet TAKE 1 TABLET BY MOUTH EVERY DAY 01/24/24  Yes End, Lonni, MD  magnesium  oxide (MAG-OX) 400 MG tablet Take 1 tablet by mouth 2 (two) times daily. 12/10/24  Yes [provider]  metFORMIN  (GLUCOPHAGE ) 500 MG tablet TAKE 2 TABLETS BY MOUTH TWICE A DAY 08/22/24  Yes Wendee Lynwood HERO, NP  mupirocin ointment (BACTROBAN) 2 % Apply 1 Application topically 2 (two) times daily. To affected area till better 12/22/24  Yes Vonna Sharlet POUR, MD  albuterol  (VENTOLIN  HFA) 108 (90 Base) MCG/ACT inhaler TAKE 2 PUFFS BY MOUTH EVERY 6 HOURS AS NEEDED FOR WHEEZE OR SHORTNESS OF BREATH 05/01/24   Wendee Lynwood HERO, NP  budesonide -formoterol  (SYMBICORT ) 160-4.5 MCG/ACT inhaler Inhale 2 puffs into the lungs in the morning and at bedtime. 04/14/24   Letvak, Richard I, MD  Cholecalciferol 125 MCG (5000 UT) CHEW Chew 5,000 Units by mouth daily.    [provider]  clonazePAM  (KLONOPIN ) 0.5 MG  tablet Take 0.5-1 tablets (0.25-0.5 mg total) by mouth 3 (three) times daily as needed for anxiety. 10/11/24   Rhys Verneita DASEN, PA-C  cyclobenzaprine  (FLEXERIL ) 10 MG tablet Take 1 tablet (10 mg total) by mouth 3 (three) times daily as needed for muscle spasms. 11/01/24   Wendee Lynwood HERO, NP  famotidine (PEPCID) 20 MG tablet Take 20 mg by mouth 2 (two) times daily.    [provider]  Ferrous Sulfate  (IRON ) 325 (65 Fe) MG TABS Take 1 tablet (325 mg total) by mouth daily. 07/17/24   Wendee Lynwood HERO, NP  fexofenadine (ALLEGRA) 180 MG tablet Take 180 mg by mouth daily.    [provider]  fluticasone  (FLONASE ) 50 MCG/ACT nasal spray Place 2 sprays into both nostrils daily. 02/17/24   Wendee Lynwood HERO, NP  furosemide  (LASIX ) 40 MG tablet TAKE 1 TABLET (40 MG TOTAL) BY MOUTH DAILY FOR 1 DAY. 11/22/24 11/23/24  Wendee Lynwood HERO, NP  magnesium  oxide (MAG-OX) 400 (240 Mg) MG tablet TAKE 1 TABLET BY MOUTH TWICE A DAY 01/25/24   Cindie Ole DASEN, MD  melatonin  3 MG TABS tablet Take 1 tablet (3 mg total) by mouth at bedtime as needed. 10/11/24   Rhys Boyer T, PA-C  montelukast  (SINGULAIR ) 10 MG tablet Take 1 tablet (10 mg total) by mouth at bedtime. 10/17/24   Wendee Lynwood HERO, NP  Olopatadine  HCl (PATADAY  OP) Place 1 drop into both eyes daily as needed (itchy eyes).    [provider]  potassium chloride  SA (KLOR-CON  M) 20 MEQ tablet TAKE 1 TABLET BY MOUTH EVERY DAY 11/21/24   Riddle, Suzann, NP  Prenatal Vit-Fe Fumarate-FA (M-NATAL PLUS) 27-1 MG TABS Take 1 tablet by mouth daily. 01/18/24   [provider]  UNABLE TO FIND CPAP    [provider]    Family History Family History  Problem Relation Age of Onset   Heart Problems Mother    Hypertension Mother    Angina Mother    Atrial fibrillation Mother    Heart Problems Father    Hypertension Father    Depression Sister    Kidney Stones Sister    Depression Brother    Bipolar disorder Cousin    Bipolar disorder  Cousin    Kidney cancer Neg Hx    Prostate cancer Neg Hx    Bladder Cancer Neg Hx     Social History Social History[1]   Allergies   Silicone, Tape, and Erythromycin   Review of Systems Review of Systems   Physical Exam Triage Vital Signs ED Triage Vitals  Encounter Vitals Group     BP 12/22/24 1745 112/74     Girls Systolic BP Percentile --      Girls Diastolic BP Percentile --      Boys Systolic BP Percentile --      Boys Diastolic BP Percentile --      Pulse Rate 12/22/24 1745 (!) 105     Resp 12/22/24 1745 20     Temp 12/22/24 1745 98.9 F (37.2 C)     Temp Source 12/22/24 1745 Oral     SpO2 12/22/24 1745 95 %     Weight 12/22/24 1744 (!) 371 lb 14.7 oz (168.7 kg)     Height --      Head Circumference --      Peak Flow --      Pain Score 12/22/24 1741 4     Pain Loc --      Pain Education --      Exclude from Growth Chart --    No data found.  Updated Vital Signs BP 112/74 (BP Location: Left Arm)   Pulse (!) 105   Temp 98.9 F (37.2 C) (Oral)   Resp 20   Wt (!) 168.7 kg   SpO2 95%   BMI 62.85 kg/m   Visual Acuity Right Eye Distance:   Left Eye Distance:   Bilateral Distance:    Right Eye Near:   Left Eye Near:    Bilateral Near:     Physical Exam Vitals reviewed.  Constitutional:      General: She is not in acute distress.    Appearance: She is not ill-appearing, toxic-appearing or diaphoretic.  Skin:    Coloration: Skin is not jaundiced or pale.     Comments: There is an abrasion that is about 1 cm along her medial nail fold of the left great toe.  It is oozing blood.  When pressure is held it improves but then it will start oozing again.  There is also a little bleeding spot on the  end of the lateral nail fold on that toe. On the right great toe there is a tiny slightly oozing abrasion at the end of the medial nail fold on the right great toe.   Neurological:     General: No focal deficit present.     Mental Status: She is alert and  oriented to person, place, and time.  Psychiatric:        Behavior: Behavior normal.      UC Treatments / Results  Labs (all labs ordered are listed, but only abnormal results are displayed) Labs Reviewed - No data to display  EKG   Radiology No results found.  Procedures Procedures (including critical care time)  Medications Ordered in UC Medications - No data to display  Initial Impression / Assessment and Plan / UC Course  I have reviewed the triage vital signs and the nursing notes.  Pertinent labs & imaging results that were available during my care of the patient were reviewed by me and considered in my medical decision making (see chart for details).     Held pressure for about 5 minutes on the bigger wound on her left great toe.  Once I let the pressure off, it started oozing blood again, fairly rapidly.  Silver nitrate sticks are used for chemical cautery and good results obtained.  I did go ahead and apply it to the tiny abrasion on the right great toe also.  Bandages are applied.  She will elevate her feet.  We discussed wound care  She is given contact information for podiatry. Final Clinical Impressions(s) / UC Diagnoses   Final diagnoses:  Abrasion of great toe, unspecified laterality, initial encounter  Long term current use of anticoagulant therapy     Discharge Instructions      Keep your feet elevated as much as possible this evening.  After that you can wash your cuts on your toes 2 times daily with soapy water and put rest antibiotic ointment on it and a Band-Aid.  Put mupirocin ointment on the sore areas twice daily until improved  Call the podiatry office for an appointment.     ED Prescriptions     Medication Sig Dispense Auth. Provider   mupirocin ointment (BACTROBAN) 2 % Apply 1 Application topically 2 (two) times daily. To affected area till better 22 g Vonna Sharlet POUR, MD      PDMP not reviewed this encounter.     [1]  Social History Tobacco Use   Smoking status: Former    Current packs/day: 0.00    Average packs/day: 0.3 packs/day for 4.0 years (1.2 ttl pk-yrs)    Types: Cigarettes    Start date: 12/22/1979    Quit date: 12/22/1983    Years since quitting: 41.0   Smokeless tobacco: Never   Tobacco comments:    Former smoker 01/26/23  Vaping Use   Vaping status: Never Used  Substance Use Topics   Alcohol use: Not Currently    Comment: 2x a year   Drug use: No     Vonna Sharlet POUR, MD 12/22/24 2028  "

## 2024-12-23 ENCOUNTER — Other Ambulatory Visit: Payer: Self-pay | Admitting: Nurse Practitioner

## 2024-12-23 ENCOUNTER — Other Ambulatory Visit: Payer: Self-pay | Admitting: Internal Medicine

## 2025-02-07 ENCOUNTER — Ambulatory Visit: Admitting: Orthopaedic Surgery

## 2025-02-19 ENCOUNTER — Ambulatory Visit: Admitting: Cardiology

## 2025-03-08 ENCOUNTER — Ambulatory Visit: Admitting: Internal Medicine

## 2025-04-11 ENCOUNTER — Ambulatory Visit: Admitting: Physician Assistant

## 2025-05-22 ENCOUNTER — Encounter: Admitting: Nurse Practitioner
